# Patient Record
Sex: Female | Born: 1965 | Race: White | Hispanic: No | Smoking: Current every day smoker
Health system: Southern US, Community
[De-identification: ages and names within clinical notes are randomized; demographics above are authoritative.]

## PROBLEM LIST (undated history)

## (undated) DIAGNOSIS — F319 Bipolar disorder, unspecified: Secondary | ICD-10-CM

## (undated) DIAGNOSIS — R569 Unspecified convulsions: Secondary | ICD-10-CM

## (undated) DIAGNOSIS — R748 Abnormal levels of other serum enzymes: Secondary | ICD-10-CM

## (undated) DIAGNOSIS — Z8711 Personal history of peptic ulcer disease: Secondary | ICD-10-CM

## (undated) DIAGNOSIS — M810 Age-related osteoporosis without current pathological fracture: Secondary | ICD-10-CM

## (undated) DIAGNOSIS — R7611 Nonspecific reaction to tuberculin skin test without active tuberculosis: Secondary | ICD-10-CM

## (undated) DIAGNOSIS — K449 Diaphragmatic hernia without obstruction or gangrene: Secondary | ICD-10-CM

## (undated) DIAGNOSIS — F17200 Nicotine dependence, unspecified, uncomplicated: Secondary | ICD-10-CM

## (undated) DIAGNOSIS — F1421 Cocaine dependence, in remission: Secondary | ICD-10-CM

## (undated) DIAGNOSIS — G43909 Migraine, unspecified, not intractable, without status migrainosus: Secondary | ICD-10-CM

## (undated) DIAGNOSIS — S76011D Strain of muscle, fascia and tendon of right hip, subsequent encounter: Secondary | ICD-10-CM

## (undated) DIAGNOSIS — K209 Esophagitis, unspecified without bleeding: Secondary | ICD-10-CM

## (undated) DIAGNOSIS — D649 Anemia, unspecified: Secondary | ICD-10-CM

## (undated) DIAGNOSIS — F419 Anxiety disorder, unspecified: Secondary | ICD-10-CM

## (undated) DIAGNOSIS — K2270 Barrett's esophagus without dysplasia: Secondary | ICD-10-CM

## (undated) DIAGNOSIS — Z87440 Personal history of urinary (tract) infections: Secondary | ICD-10-CM

## (undated) DIAGNOSIS — J449 Chronic obstructive pulmonary disease, unspecified: Secondary | ICD-10-CM

## (undated) DIAGNOSIS — S52502A Unspecified fracture of the lower end of left radius, initial encounter for closed fracture: Secondary | ICD-10-CM

## (undated) DIAGNOSIS — Z8719 Personal history of other diseases of the digestive system: Secondary | ICD-10-CM

## (undated) HISTORY — DX: Age-related osteoporosis without current pathological fracture: M81.0

## (undated) HISTORY — DX: Abnormal levels of other serum enzymes: R74.8

## (undated) HISTORY — DX: Anemia, unspecified: D64.9

## (undated) HISTORY — DX: Esophagitis, unspecified without bleeding: K20.90

## (undated) HISTORY — DX: Diaphragmatic hernia without obstruction or gangrene: K44.9

## (undated) HISTORY — DX: Nonspecific reaction to tuberculin skin test without active tuberculosis: R76.11

## (undated) HISTORY — DX: Unspecified convulsions: R56.9

## (undated) HISTORY — DX: Nicotine dependence, unspecified, uncomplicated: F17.200

## (undated) HISTORY — DX: Chronic obstructive pulmonary disease, unspecified: J44.9

## (undated) HISTORY — DX: Barrett's esophagus without dysplasia: K22.70

## (undated) HISTORY — DX: Bipolar disorder, unspecified: F31.9

## (undated) HISTORY — DX: Personal history of urinary (tract) infections: Z87.440

## (undated) HISTORY — PX: DILATION AND CURETTAGE OF UTERUS: SHX78

---
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
## 1987-09-01 DIAGNOSIS — R7611 Nonspecific reaction to tuberculin skin test without active tuberculosis: Secondary | ICD-10-CM

## 1987-09-01 HISTORY — DX: Nonspecific reaction to tuberculin skin test without active tuberculosis: R76.11

## 2001-08-31 HISTORY — PX: TUBAL LIGATION: SHX77

## 2004-08-31 HISTORY — PX: LAPAROSCOPIC CHOLECYSTECTOMY: SUR755

## 2010-06-26 ENCOUNTER — Ambulatory Visit: Payer: Self-pay | Admitting: Family Medicine

## 2010-12-18 ENCOUNTER — Encounter: Payer: Self-pay | Admitting: Family Medicine

## 2010-12-18 ENCOUNTER — Ambulatory Visit (INDEPENDENT_AMBULATORY_CARE_PROVIDER_SITE_OTHER): Payer: 59 | Admitting: Family Medicine

## 2010-12-18 VITALS — BP 122/82 | HR 88 | Temp 97.6°F | Ht 62.0 in | Wt 162.0 lb

## 2010-12-18 DIAGNOSIS — Z0000 Encounter for general adult medical examination without abnormal findings: Secondary | ICD-10-CM

## 2010-12-18 DIAGNOSIS — Z1231 Encounter for screening mammogram for malignant neoplasm of breast: Secondary | ICD-10-CM | POA: Insufficient documentation

## 2010-12-18 DIAGNOSIS — Z720 Tobacco use: Secondary | ICD-10-CM

## 2010-12-18 DIAGNOSIS — F319 Bipolar disorder, unspecified: Secondary | ICD-10-CM

## 2010-12-18 DIAGNOSIS — F17200 Nicotine dependence, unspecified, uncomplicated: Secondary | ICD-10-CM

## 2010-12-18 DIAGNOSIS — R062 Wheezing: Secondary | ICD-10-CM

## 2010-12-18 LAB — CBC WITH DIFFERENTIAL/PLATELET
Basophils Absolute: 0 10*3/uL (ref 0.0–0.1)
Basophils Relative: 0.3 % (ref 0.0–3.0)
HCT: 40.7 % (ref 36.0–46.0)
Hemoglobin: 13.8 g/dL (ref 12.0–15.0)
Lymphs Abs: 1.9 10*3/uL (ref 0.7–4.0)
MCHC: 33.9 g/dL (ref 30.0–36.0)
Monocytes Relative: 6 % (ref 3.0–12.0)
Neutro Abs: 8.3 10*3/uL — ABNORMAL HIGH (ref 1.4–7.7)
RBC: 4.25 Mil/uL (ref 3.87–5.11)
RDW: 13.4 % (ref 11.5–14.6)

## 2010-12-18 LAB — LIPID PANEL
Cholesterol: 219 mg/dL — ABNORMAL HIGH (ref 0–200)
HDL: 56.1 mg/dL (ref >39.00)
Total CHOL/HDL Ratio: 4
Triglycerides: 161 mg/dL — ABNORMAL HIGH (ref 0.0–149.0)
VLDL: 32.2 mg/dL (ref 0.0–40.0)

## 2010-12-18 LAB — COMPREHENSIVE METABOLIC PANEL
ALT: 13 U/L (ref 0–35)
AST: 13 U/L (ref 0–37)
Creatinine, Ser: 0.8 mg/dL (ref 0.4–1.2)
Total Bilirubin: 0.5 mg/dL (ref 0.3–1.2)

## 2010-12-18 MED ORDER — ALBUTEROL 90 MCG/ACT IN AERS: 2.0000 | INHALATION_SPRAY | Freq: Four times a day (QID) | RESPIRATORY_TRACT | Status: DC | PRN

## 2010-12-18 NOTE — Assessment & Plan Note (Signed)
Discussed smoking cessation, provided info on [email or web address]

## 2010-12-18 NOTE — Assessment & Plan Note (Signed)
Albuterol as needed, states has had this in past.

## 2010-12-18 NOTE — Progress Notes (Signed)
  Subjective:    Patient ID: Robin Welch, female    DOB: [birth year], 45 y.o.   MRN: [id number]  HPI CC: new patient, establish  concerns - overweight - since started lithium.  Also has trouble working at computer, trouble focusing and gets irritated.  Can't focus to read book.  Has diarrhea every day.  Anxiety level stays high.  Brain continues to repeat things over and over again, sometimes slurs speech in afternoons.  Feels very anxious about this, feels memory has been worsening recently.  ie thinks "jenny on block" and it continues to go through brain.  Feels looping through.  No auditory/visual hallucinations.  Thoughts not intrusive.  This is first time has been on lithium.  Has been on abilify for long time, prior on effexor and lexapro.    Hospitalized 05/2010 for extreme anxiety attacks.  H/o bipolar diagnosed >10 years ago.  No HA, unilateral weakness, facial weakness.  Preventative: No recent blood work, CPE Last pap 2006 No mammograms  Review of Systems  Constitutional: Negative for fever, chills, activity change, appetite change, fatigue and unexpected weight change.  HENT: Negative for hearing loss and neck pain.   Eyes: Negative for visual disturbance.  Respiratory: Negative for cough, chest tightness, shortness of breath and wheezing.   Cardiovascular: Negative for chest pain, palpitations and leg swelling.  Gastrointestinal: Negative for nausea, vomiting, abdominal pain, diarrhea, constipation, blood in stool and abdominal distention.  Genitourinary: Negative for hematuria and difficulty urinating.  Musculoskeletal: Negative for myalgias and arthralgias.  Skin: Negative for rash.  Neurological: Negative for dizziness, seizures, syncope and headaches.  Hematological: Does not bruise/bleed easily.  Psychiatric/Behavioral: Negative for dysphoric mood. The patient is not nervous/anxious.   no breast masses.     Objective:   Physical Exam  Nursing note and vitals  reviewed. Constitutional: She is oriented to person, place, and time. She appears well-developed and well-nourished.  HENT:  Head: Normocephalic and atraumatic.  Right Ear: External ear normal.  Left Ear: External ear normal.  Nose: Nose normal.  Mouth/Throat: Oropharynx is clear and moist. No oropharyngeal exudate.  Eyes: Conjunctivae and EOM are normal. Pupils are equal, round, and reactive to light. No scleral icterus.  Neck: Normal range of motion. Neck supple. No thyromegaly present.  Cardiovascular: Normal rate, regular rhythm, normal heart sounds and intact distal pulses.   No murmur heard. Pulses:      Radial pulses are 2+ on the right side, and 2+ on the left side.  Pulmonary/Chest: Effort normal and breath sounds normal. No respiratory distress. She has no wheezes. She has no rales.  Abdominal: Soft. Bowel sounds are normal. She exhibits no distension and no mass. There is no tenderness. There is no rebound and no guarding.  Musculoskeletal: Normal range of motion.  Lymphadenopathy:    She has no cervical adenopathy.  Neurological: She is alert and oriented to person, place, and time.       CN grossly intact, station and gait intact  Skin: Skin is warm and dry. No rash noted.  Psychiatric: Her behavior is normal. Judgment and thought content normal. Her mood appears anxious.          Assessment & Plan:

## 2010-12-18 NOTE — Patient Instructions (Signed)
Blood work today.  We wil fax to Dr. Runell Gess office at [phone] Return in next 1-2 months for complete physical. Think about cutting back or quitting smoking.  Look into [email or web address]. Let psychiatrist know about those symptoms to see what she thinks.  Continue meds for now. Albuterol sent to pharmacy to use as needed.  If not noticing improvement in breathing, return for office spirometry.

## 2010-12-18 NOTE — Assessment & Plan Note (Signed)
Set up with mammo per pt request.  Will be baseline mammo as hasn't had in past. Set up with cpe with pap in near future.  Fasting today so blood work checked.

## 2010-12-18 NOTE — Assessment & Plan Note (Signed)
check blood work today.  Dx >10 years As far as bipolar sxs, seems stable on current meds, wonder if recent increase too fast causing some of sxs described. Reviewed lithium, can cause diarrhea, slurred speech, weight gain. rec discuss with psych, has appt Tuesday.

## 2010-12-27 ENCOUNTER — Ambulatory Visit (INDEPENDENT_AMBULATORY_CARE_PROVIDER_SITE_OTHER): Payer: 59 | Admitting: Family Medicine

## 2010-12-27 ENCOUNTER — Encounter: Payer: Self-pay | Admitting: Family Medicine

## 2010-12-27 ENCOUNTER — Emergency Department (HOSPITAL_COMMUNITY)
Admission: EM | Admit: 2010-12-27 | Discharge: 2010-12-27 | Disposition: A | Discharge status: Home / Self Care | Payer: 59 | Attending: Emergency Medicine | Admitting: Emergency Medicine

## 2010-12-27 ENCOUNTER — Emergency Department (HOSPITAL_COMMUNITY): Payer: 59

## 2010-12-27 DIAGNOSIS — R109 Unspecified abdominal pain: Secondary | ICD-10-CM | POA: Insufficient documentation

## 2010-12-27 DIAGNOSIS — R1033 Periumbilical pain: Secondary | ICD-10-CM | POA: Insufficient documentation

## 2010-12-27 DIAGNOSIS — R300 Dysuria: Secondary | ICD-10-CM

## 2010-12-27 DIAGNOSIS — F319 Bipolar disorder, unspecified: Secondary | ICD-10-CM | POA: Insufficient documentation

## 2010-12-27 DIAGNOSIS — Z9089 Acquired absence of other organs: Secondary | ICD-10-CM | POA: Insufficient documentation

## 2010-12-27 DIAGNOSIS — Z9889 Other specified postprocedural states: Secondary | ICD-10-CM | POA: Insufficient documentation

## 2010-12-27 DIAGNOSIS — R10813 Right lower quadrant abdominal tenderness: Secondary | ICD-10-CM | POA: Insufficient documentation

## 2010-12-27 LAB — DIFFERENTIAL
Basophils Relative: 0 % (ref 0–1)
Eosinophils Absolute: 0.2 10*3/uL (ref 0.0–0.7)
Monocytes Absolute: 0.8 10*3/uL (ref 0.1–1.0)
Monocytes Relative: 7 % (ref 3–12)

## 2010-12-27 LAB — POCT URINALYSIS DIPSTICK
Bilirubin, UA: NEGATIVE
Ketones, UA: NEGATIVE
Leukocytes, UA: NEGATIVE
Protein, UA: NEGATIVE
Spec Grav, UA: 1.005

## 2010-12-27 LAB — POCT I-STAT, CHEM 8
BUN: 10 mg/dL (ref 6–23)
Calcium, Ion: 1.15 mmol/L (ref 1.12–1.32)
Creatinine, Ser: 1.1 mg/dL (ref 0.4–1.2)
Glucose, Bld: 92 mg/dL (ref 70–99)
TCO2: 23 mmol/L (ref 0–100)

## 2010-12-27 LAB — LITHIUM LEVEL: Lithium Lvl: 1.43 mEq/L — ABNORMAL HIGH (ref 0.80–1.40)

## 2010-12-27 LAB — CBC
MCH: 31.8 pg (ref 26.0–34.0)
MCHC: 33 g/dL (ref 30.0–36.0)
MCV: 96.5 fL (ref 78.0–100.0)
Platelets: 410 10*3/uL — ABNORMAL HIGH (ref 150–400)

## 2010-12-27 LAB — URINALYSIS, ROUTINE W REFLEX MICROSCOPIC
Bilirubin Urine: NEGATIVE
Glucose, UA: NEGATIVE mg/dL
Hgb urine dipstick: NEGATIVE
Ketones, ur: NEGATIVE mg/dL
Protein, ur: NEGATIVE mg/dL

## 2010-12-27 MED ORDER — IOHEXOL 300 MG/ML  SOLN
125.0000 mL | Freq: Once | INTRAMUSCULAR | Status: AC | PRN
  Administered 2010-12-27: 125 mL via INTRAVENOUS

## 2010-12-27 NOTE — Progress Notes (Signed)
  Subjective:    Patient ID: Robin Welch, female    DOB: 23-Feb-1966, 45 y.o.   MRN: [id number]  HPIwd wn fem. Smoker with the sudden onset of severe peri/umb. abd pain x 1 day  Approx. Noon yest. She dev. The sudden onset of severe, sharp, peri umb abd pain.  Constant,  '6',,,,no naus, vom., dia, etc   Thinks it is a uti but u/a wnl    Review of Systems  Constitutional: Negative.   Gastrointestinal: Positive for abdominal pain.  Genitourinary: Negative.        Objective:   Physical Exam  Constitutional: She appears well-developed and well-nourished. No distress.  Abdominal: Soft. Bowel sounds are normal. She exhibits no distension and no mass. There is tenderness. There is rebound. There is no guarding.  Skin: She is not diaphoretic.          Assessment & Plan:  Acute peri-umb abd pain x 1 days  ?app. ? rupt ov cyst.........to er for eval

## 2010-12-27 NOTE — Patient Instructions (Signed)
Go to the er now  °

## 2011-01-15 ENCOUNTER — Telehealth: Payer: Self-pay | Admitting: *Deleted

## 2011-01-15 NOTE — Telephone Encounter (Signed)
Pt has been seeing a psychiatrist, someone in Dr. Runell Gess office, but pt is not happy there and would like the names of some others. Either [place] or [facility] will be fine.

## 2011-01-15 NOTE — Telephone Encounter (Signed)
Shirlee Limerick, do you know of any psychiatrists to refer to?  I'm not familiar with any in the area.

## 2011-01-16 NOTE — Telephone Encounter (Signed)
Called patient and gave her names of new Drs in [place] area. She will call to make an appt.

## 2011-04-25 ENCOUNTER — Emergency Department: Payer: Self-pay | Admitting: Internal Medicine

## 2011-11-03 ENCOUNTER — Telehealth: Payer: Self-pay | Admitting: Family Medicine

## 2011-11-03 NOTE — Telephone Encounter (Signed)
Order in chart.  Will route to [facility].

## 2011-11-03 NOTE — Telephone Encounter (Signed)
Patient has never had a mammogram.  Patient would like to have a mammogram scheduled in [place].  She's not having any problems.

## 2011-11-30 ENCOUNTER — Ambulatory Visit: Payer: 59

## 2011-12-07 ENCOUNTER — Ambulatory Visit
Admission: RE | Admit: 2011-12-07 | Discharge: 2011-12-07 | Disposition: A | Discharge status: Home / Self Care | Payer: 59 | Source: Ambulatory Visit | Attending: Family Medicine | Admitting: Family Medicine

## 2011-12-07 DIAGNOSIS — Z1231 Encounter for screening mammogram for malignant neoplasm of breast: Secondary | ICD-10-CM

## 2011-12-08 ENCOUNTER — Encounter: Payer: Self-pay | Admitting: *Deleted

## 2012-08-09 ENCOUNTER — Ambulatory Visit: Payer: 59 | Admitting: Gynecology

## 2012-08-23 ENCOUNTER — Ambulatory Visit: Payer: 59 | Admitting: Gynecology

## 2012-10-08 ENCOUNTER — Emergency Department (HOSPITAL_COMMUNITY)
Admission: EM | Admit: 2012-10-08 | Discharge: 2012-10-08 | Disposition: A | Discharge status: Home / Self Care | Payer: 59 | Attending: Emergency Medicine | Admitting: Emergency Medicine

## 2012-10-08 DIAGNOSIS — F319 Bipolar disorder, unspecified: Secondary | ICD-10-CM | POA: Insufficient documentation

## 2012-10-08 DIAGNOSIS — Z87440 Personal history of urinary (tract) infections: Secondary | ICD-10-CM | POA: Insufficient documentation

## 2012-10-08 DIAGNOSIS — Z8669 Personal history of other diseases of the nervous system and sense organs: Secondary | ICD-10-CM | POA: Insufficient documentation

## 2012-10-08 DIAGNOSIS — Z79899 Other long term (current) drug therapy: Secondary | ICD-10-CM | POA: Insufficient documentation

## 2012-10-08 DIAGNOSIS — F17200 Nicotine dependence, unspecified, uncomplicated: Secondary | ICD-10-CM | POA: Insufficient documentation

## 2012-10-08 DIAGNOSIS — F410 Panic disorder [episodic paroxysmal anxiety] without agoraphobia: Secondary | ICD-10-CM | POA: Insufficient documentation

## 2012-10-08 DIAGNOSIS — Z8709 Personal history of other diseases of the respiratory system: Secondary | ICD-10-CM | POA: Insufficient documentation

## 2012-10-08 MED ORDER — LORAZEPAM 1 MG PO TABS
1.0000 mg | ORAL_TABLET | Freq: Once | ORAL | Status: AC
  Administered 2012-10-08: 1 mg via ORAL
  Filled 2012-10-08: qty 1

## 2012-10-08 MED ORDER — LORAZEPAM 1 MG PO TABS: 1.0000 mg | ORAL_TABLET | Freq: Two times a day (BID) | ORAL | Status: DC | PRN

## 2012-10-08 NOTE — ED Notes (Signed)
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 Pt denies SI or HI states just wants something to help with the anxiety / panic attacks until can go to [facility].

## 2012-10-08 NOTE — ED Provider Notes (Signed)
Medical screening examination/treatment/procedure(s) were performed by non-physician practitioner and as supervising physician I was immediately available for consultation/collaboration.  Sunnie Nielsen, MD 10/08/12 2312

## 2012-10-08 NOTE — ED Provider Notes (Signed)
History     CSN: [id number]  Arrival date & time 10/08/12  0217   First MD Initiated Contact with Patient 10/08/12 [phone]      CC: Panic attack  (Consider location/radiation/quality/duration/timing/severity/associated sxs/prior treatment) HPI Comments: Pt with h/o bipolar, presents with panic attacks. States she is on clonazepam however she is now out because she took more than she was supposed to. She has her other medications. Reporting database shows #180 0.5mg  clonazepam filled 09/05/12. She states she is going to [facility] in 2 days. Requesting medication until then. No SI/HI. The onset of this condition was acute. The course is constant. Aggravating factors: none. Alleviating factors: none. History of cocaine abuse -- last use 4 years ago.    The history is provided by the patient.    Past Medical History  Diagnosis Date  . Bipolar 1 disorder   . History of tension headache   . History of migraines   . 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 Seizure     from HA medicine?   Marland Kitchen Hx: UTI (urinary tract infection)     Past Surgical History  Procedure Laterality Date  . Cholecystectomy  2006  . Tubal ligation  2003    Family History  Problem Relation Age of Onset  . Diabetes Mother     T2DM  . Breast cancer Mother 1  . Coronary artery disease Father 16    MI  . Cancer Neg Hx   . Stroke Neg Hx     History  Substance Use Topics  . Smoking status: Current Every Day Smoker -- 1.00 packs/day    Types: Cigarettes  . Smokeless tobacco: Never Used     Comment: started age 79yo, tried chantix in past  . Alcohol Use: No    OB History   Grav Para Term Preterm Abortions TAB SAB Ect Mult Living                  Review of Systems  Constitutional: Negative for fever.  HENT: Negative for sore throat and rhinorrhea.   Eyes: Negative for redness.  Respiratory: Negative for cough.   Cardiovascular: Negative for chest pain.  Gastrointestinal: Negative for  nausea, vomiting, abdominal pain and diarrhea.  Genitourinary: Negative for dysuria.  Musculoskeletal: Negative for myalgias.  Skin: Negative for rash.  Neurological: Negative for headaches.  Psychiatric/Behavioral: Negative for suicidal ideas, sleep disturbance and self-injury. The patient is nervous/anxious.     Allergies  Prozac  Home Medications   Current Outpatient Rx  Name  Route  Sig  Dispense  Refill  . EXPIRED: albuterol (PROVENTIL,VENTOLIN) 90 MCG/ACT inhaler   Inhalation   Inhale 2 puffs into the lungs every 6 (six) hours as needed for wheezing.   17 g   3   . ARIPiprazole (ABILIFY) 15 MG tablet   Oral   Take 15 mg by mouth daily.           Marland Kitchen lithium (ESKALITH) 450 MG CR tablet   Oral   Take 450 mg by mouth 2 (two) times daily.             BP 111/67  Pulse 77  Temp(Src) 97.9 F (36.6 C) (Oral)  Resp 18  SpO2 100%  Physical Exam  Nursing note and vitals reviewed. Constitutional: She appears well-developed and well-nourished.  HENT:  Head: Normocephalic and atraumatic.  Eyes: Conjunctivae are normal. Right eye exhibits no discharge. Left eye exhibits no  discharge.  Neck: Normal range of motion. Neck supple.  Cardiovascular: Normal rate, regular rhythm and normal heart sounds.   Pulmonary/Chest: Effort normal and breath sounds normal.  Abdominal: Soft. There is no tenderness.  Neurological: She is alert.  Skin: Skin is warm and dry.  Psychiatric: She has a normal mood and affect.    ED Course  Procedures (including critical care time)  Labs Reviewed - No data to display No results found.   1. Panic attack     6:33 AM Patient seen and examined. Will give limited amount of ativan for patient to use over the weekend. She is otherwise well. She has her other medications.   Vital signs reviewed and are as follows: Filed Vitals:   10/08/12 0638  BP: 111/67  Pulse: 77  Temp: 97.9 F (36.6 C)  Resp: 18   Patient urged to return with  worsening symptoms or other concerns. Patient verbalized understanding and agrees with plan.     MDM  Panic attack, normal exam in ED. No SI/HI. She states she has appt with [facility] upcoming.         [place], [place] 10/08/12 [phone]

## 2012-10-08 NOTE — Discharge Instructions (Signed)
Please read and follow all provided instructions.  Your diagnoses today include:  1. Panic attack     Tests performed today include:  Vital signs. See below for your results today.   Medications prescribed:   Ativan - medication for anxiety  Take any prescribed medications only as directed.  Home care instructions:  Follow any educational materials contained in this packet.  BE VERY CAREFUL not to take multiple medicines containing Tylenol (also called acetaminophen). Doing so can lead to an overdose which can damage your liver and cause liver failure and possibly death.   Follow-up instructions: Please follow-up with your primary care provider in the next 3 days for further evaluation of your symptoms. If you do not have a primary care doctor -- see below for referral information.   [facility].   Return instructions:   Please return to the Emergency Department if you experience worsening symptoms.   Please return if you have any other emergent concerns.  -------------- RESOURCE GUIDE  Chronic Pain Problems:  [facility]:  [phone]  Patients need to be referred by their primary care doctor  Insufficient Money for Medicine:  [employer]:     call "211"  [facility]:  2187524792  No Primary Care Doctor:  To locate a primary care doctor that accepts your insurance or provides certain services:  [facility] :   [phone]  Physician Referral Service:  [phone]  Agencies that provide inexpensive medical care:  [facility]:   [phone]  [facility]:   [phone]  [facility]:   [phone]  Women's Clinic:     [phone]  Planned Parenthood:    [phone]  [facility]:     [phone]  Medicaid-accepting [facility] Providers:  [facility]  [address], [phone], Mon-Fri 9am-7pm, Sat  9am-1pm  [facility]  [address] [place], [address] [place], [phone]  [facility]  [address] [place], [phone]  [facility]  [address], [phone]  Renaye Rakers  [address], [phone]  Only accepts [place] [employer] patients after they have their name  applied to their card  Self Pay (no insurance) in [facility]:  Sickle Cell Patients: Dr. Willey Blade, [facility]  [address] [place], [phone]  [facility]  [address] [place], [phone]  [facility] [place]  [address] [facility] [address], [facility] - [address]  [phone], Mon-Fri 9am-7pm, Sat 9am-1pm  [facility]  [address] [place], [phone]  [facility]  [address], [phone]  [facility]  [address], [phone]  Dr. Julio Sicks  [address], [place], [phone]  [facility]  [address], [phone]  [facility]  [address], [phone]  [address], [phone]  [facility]  [address] [place], [phone], 1st & 3rd Saturday every month, 10am-1pm  Strategies for finding a Primary Care Provider:  1) Find a Doctor and Pay Out of Pocket Although you won't have to find out who is covered by your insurance plan, it is a good idea to ask around and get recommendations. You will then need to call  the office and see if the doctor you have chosen will accept you as a new patient and what types of options they offer for patients who are self-pay. Some doctors offer discounts or will set up payment plans for their patients who do not have insurance, but you will need to ask so you aren't surprised when you get to your appointment.  2) Contact Your Local Health Department Not all health departments have doctors  that can see patients for sick visits, but many do, so it is worth a call to see if yours does. If you don't know where your local health department is, you can check in your phone book. The CDC also has a tool to help you locate your state's health department, and many state websites also have listings of all of their local health departments.  3) Find a Walk-in Clinic If your illness is not likely to be very severe or complicated, you may want to try a walk in clinic. These are popping up all over the country in pharmacies, drugstores, and shopping centers. They're usually staffed by nurse practitioners or physician assistants that have been trained to treat common illnesses and complaints. They're usually fairly quick and inexpensive. However, if you have serious medical issues or chronic medical problems, these are probably not your best option  STD Testing:  [facility] [place], [place] [facility]  [address] [place], phone [phone] or [phone]    Monday - Friday, call for an appointment  [facility], [place] [facility]  [address], [place], phone [phone] or [phone]   Monday - Friday, call for an appointment  Abuse/Neglect:  [facility] Child Abuse Hotline:  867-079-9345  [facility] Child Abuse Hotline: 773-412-0479 (After Hours)  Emergency Shelter:  [facility] 671-814-2374  Maternity Homes:  [facility]: [phone]  [facility] Services: [phone]  MRSA Hotline:   3651275650   [facility]  [address]  [phone]   [place]  [address], [place]  [phone]   [facility]  [address], [place]  [phone]   [facility]  [phone]   [facility]  Services  [phone]   [facility] in [place]  [address]  [phone], [facility] Child Abuse Hotline  716-728-0789  [phone] (After Hours)   Behavioral Health Services  Substance Abuse Resources:  Alcohol and Drug Services:     307-073-5980  [facility]:   [phone]  [facility]:     [phone]  [facility]:      [phone]  Residential & Outpatient Substance Abuse Program: 519-312-8199  Psychological Services:  [facility]:   [phone]  [facility] Services:    5153917582  [facility]  [address] [place]  ACCESS LINE: [phone] or [phone]  [employer]  Dental Assistance: If unable to pay or uninsured, contact:  [facility] or [facility]. to become qualified for the adult dental clinic.  Patients with Medicaid  [facility]  [phone] [address], [phone]  [address] [phone]  If unable to pay, or uninsured: contact [facility] [phone]) or [facility] Department [phone] in [place], [phone] in [place]) to become qualified  for the adult dental clinic  Other [facility] Services:  [facility]  [address] [place], [place], [place], [address]  [phone], Ext. [phone]  2nd and 4th Thursday of the month at [facility]  [facility]  [address] [place], [place], [place], [address]  [phone]  [facility]  [address] [place], [place], [address]  [phone]  [facility] Health Department  [phone]  [facility] Health Department  [phone]  [facility] Health Department  [phone]

## 2012-12-18 ENCOUNTER — Telehealth: Payer: Self-pay | Admitting: Family Medicine

## 2012-12-18 ENCOUNTER — Encounter: Payer: Self-pay | Admitting: Family Medicine

## 2012-12-18 DIAGNOSIS — Z79899 Other long term (current) drug therapy: Secondary | ICD-10-CM

## 2012-12-18 DIAGNOSIS — F3175 Bipolar disorder, in partial remission, most recent episode depressed: Secondary | ICD-10-CM

## 2012-12-18 NOTE — Telephone Encounter (Signed)
Received request from Dr. Tomasa Rand psychiatrist to obtain A1c and lipid panel, dx V58.69, 296.55 plz have pt come in for fasting blood work and afterwards for physical as she's due. Then request fax results to [phone] attn Dr. Tomasa Rand.

## 2012-12-19 ENCOUNTER — Other Ambulatory Visit: Payer: 59

## 2012-12-20 NOTE — Telephone Encounter (Signed)
Patient has appts scheduled

## 2012-12-22 ENCOUNTER — Encounter: Payer: 59 | Admitting: Family Medicine

## 2012-12-27 ENCOUNTER — Other Ambulatory Visit: Payer: Self-pay

## 2012-12-27 DIAGNOSIS — Z1231 Encounter for screening mammogram for malignant neoplasm of breast: Secondary | ICD-10-CM

## 2012-12-28 ENCOUNTER — Other Ambulatory Visit (INDEPENDENT_AMBULATORY_CARE_PROVIDER_SITE_OTHER): Payer: 59

## 2012-12-28 DIAGNOSIS — F17200 Nicotine dependence, unspecified, uncomplicated: Secondary | ICD-10-CM

## 2012-12-28 DIAGNOSIS — Z79899 Other long term (current) drug therapy: Secondary | ICD-10-CM

## 2012-12-28 DIAGNOSIS — Z720 Tobacco use: Secondary | ICD-10-CM

## 2012-12-28 DIAGNOSIS — F3175 Bipolar disorder, in partial remission, most recent episode depressed: Secondary | ICD-10-CM

## 2012-12-28 LAB — LDL CHOLESTEROL, DIRECT: Direct LDL: 128.1 mg/dL

## 2012-12-28 LAB — CBC WITH DIFFERENTIAL/PLATELET
Basophils Absolute: 0 10*3/uL (ref 0.0–0.1)
Eosinophils Absolute: 0.2 10*3/uL (ref 0.0–0.7)
Eosinophils Relative: 2.6 % (ref 0.0–5.0)
Lymphs Abs: 2.1 10*3/uL (ref 0.7–4.0)
MCV: 92.9 fl (ref 78.0–100.0)
Monocytes Absolute: 0.6 10*3/uL (ref 0.1–1.0)
Neutrophils Relative %: 58.1 % (ref 43.0–77.0)
Platelets: 386 10*3/uL (ref 150.0–400.0)
RDW: 13.5 % (ref 11.5–14.6)
WBC: 7 10*3/uL (ref 4.5–10.5)

## 2012-12-28 LAB — COMPREHENSIVE METABOLIC PANEL
AST: 14 U/L (ref 0–37)
Albumin: 4.3 g/dL (ref 3.5–5.2)
BUN: 12 mg/dL (ref 6–23)
Calcium: 8.9 mg/dL (ref 8.4–10.5)
Chloride: 102 mEq/L (ref 96–112)
Glucose, Bld: 76 mg/dL (ref 70–99)
Potassium: 4.5 mEq/L (ref 3.5–5.1)
Sodium: 135 mEq/L (ref 135–145)
Total Protein: 7.5 g/dL (ref 6.0–8.3)

## 2012-12-28 LAB — LIPID PANEL: VLDL: 13.6 mg/dL (ref 0.0–40.0)

## 2013-01-03 ENCOUNTER — Encounter: Payer: 59 | Admitting: Family Medicine

## 2013-01-11 ENCOUNTER — Ambulatory Visit
Admission: RE | Admit: 2013-01-11 | Discharge: 2013-01-11 | Disposition: A | Discharge status: Home / Self Care | Payer: 59 | Source: Ambulatory Visit

## 2013-01-11 DIAGNOSIS — Z1231 Encounter for screening mammogram for malignant neoplasm of breast: Secondary | ICD-10-CM

## 2013-01-12 ENCOUNTER — Encounter: Payer: Self-pay | Admitting: *Deleted

## 2013-01-17 ENCOUNTER — Encounter: Payer: 59 | Admitting: Family Medicine

## 2013-01-31 ENCOUNTER — Encounter: Payer: 59 | Admitting: Family Medicine

## 2013-02-21 ENCOUNTER — Encounter: Payer: 59 | Admitting: Family Medicine

## 2013-03-02 ENCOUNTER — Encounter: Payer: Self-pay | Admitting: Family Medicine

## 2013-03-02 ENCOUNTER — Ambulatory Visit (INDEPENDENT_AMBULATORY_CARE_PROVIDER_SITE_OTHER): Payer: 59 | Admitting: Family Medicine

## 2013-03-02 VITALS — BP 108/70 | HR 88 | Temp 97.4°F | Ht 62.0 in | Wt 125.8 lb

## 2013-03-02 DIAGNOSIS — F3175 Bipolar disorder, in partial remission, most recent episode depressed: Secondary | ICD-10-CM

## 2013-03-02 DIAGNOSIS — Z23 Encounter for immunization: Secondary | ICD-10-CM

## 2013-03-02 DIAGNOSIS — F17200 Nicotine dependence, unspecified, uncomplicated: Secondary | ICD-10-CM

## 2013-03-02 DIAGNOSIS — Z720 Tobacco use: Secondary | ICD-10-CM

## 2013-03-02 DIAGNOSIS — Z0000 Encounter for general adult medical examination without abnormal findings: Secondary | ICD-10-CM

## 2013-03-02 NOTE — Patient Instructions (Signed)
Good to see you today! Call us with questions. Return to see me in 1 year, sooner if needed. I want you to strongly consider cutting back and quitting smoking - best thing you can do for your overall health. Tdap today.

## 2013-03-02 NOTE — Assessment & Plan Note (Signed)
Strongly encouraged smoking cessation for health and financial benefit.

## 2013-03-02 NOTE — Assessment & Plan Note (Signed)
Preventative protocols reviewed and updated unless pt declined. Discussed healthy diet and lifestyle.  

## 2013-03-02 NOTE — Assessment & Plan Note (Signed)
Followed closely by Dr. Tomasa Rand. Pt endorses doing wonderfully.

## 2013-03-02 NOTE — Addendum Note (Signed)
Addended by: Shon Millet on: 03/02/2013 02:16 PM   Modules accepted: Orders

## 2013-03-02 NOTE — Progress Notes (Signed)
Subjective:    Patient ID: Robin Welch, female    DOB: [birth year], 47 y.o.   MRN: [id number]  HPI CC: CPE  Feeling wonder today - best she's felt in 10 years.  Feels she is finally on correct medications for bipolar.  Sees Dr. Tomasa Rand Psych for bipolar.  wellbutrin has helped anhedonia.  seroquel helping anxiety - started 3 d ago.  Smoking - 2 ppd.    Precontemplative.   Trying to cut back on diet Mt Dew.  Seat belt use discussed Sunscreen use discussed.  Preventative:  [facility] for well woman exams. Due to schedule. Mammo 2014 normal. Flu shot - received this year. Tetanus - has not had.  Tdap today.  Medications and allergies reviewed and updated in chart.  Past histories reviewed and updated if relevant as below. Patient Active Problem List   Diagnosis Date Noted  . Abdominal pain, periumbilical 12/27/2010  . Tobacco abuse 12/18/2010  . Wheezing 12/18/2010  . Bipolar I disorder, most recent episode (or current) depressed, in partial or unspecified remission 12/18/2010  . Other screening mammogram 12/18/2010  . Healthcare maintenance 12/18/2010   Past Medical History  Diagnosis Date  . Bipolar 1 disorder     sees Dr. Tomasa Rand psych [phone])  . History of tension headache   . History of migraines   . 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 Seizure     from HA medicine?   Marland Kitchen Hx: UTI (urinary tract infection)    Past Surgical History  Procedure Laterality Date  . Cholecystectomy  2006  . Tubal ligation  2003   History  Substance Use Topics  . Smoking status: Current Every Day Smoker -- 2.00 packs/day    Types: Cigarettes  . Smokeless tobacco: Never Used     Comment: started age 87yo, tried chantix in past  . Alcohol Use: No   Family History  Problem Relation Age of Onset  . Diabetes Mother     T2DM  . Breast cancer Mother 32  . Coronary artery disease Father 38    MI  . Cancer Neg Hx   . Stroke Neg Hx    Allergies  Allergen  Reactions  . Prozac (Fluoxetine Hcl) Swelling and Rash   Current Outpatient Prescriptions on File Prior to Visit  Medication Sig Dispense Refill  . lamoTRIgine (LAMICTAL) 200 MG tablet Take 200 mg by mouth daily.       No current facility-administered medications on file prior to visit.      Review of Systems  Constitutional: Negative for fever, chills, activity change, appetite change, fatigue and unexpected weight change.  HENT: Negative for hearing loss and neck pain.   Eyes: Negative for visual disturbance.  Respiratory: Negative for cough, chest tightness, shortness of breath and wheezing.   Cardiovascular: Negative for chest pain, palpitations and leg swelling.  Gastrointestinal: Negative for nausea, vomiting, abdominal pain, diarrhea, constipation, blood in stool and abdominal distention.  Genitourinary: Negative for hematuria and difficulty urinating.  Musculoskeletal: Negative for myalgias and arthralgias.  Skin: Negative for rash.  Neurological: Negative for dizziness, seizures, syncope and headaches.  Hematological: Negative for adenopathy. Does not bruise/bleed easily.  Psychiatric/Behavioral: Negative for dysphoric mood. The patient is not nervous/anxious.        Objective:   Physical Exam  Nursing note and vitals reviewed. Constitutional: She is oriented to person, place, and time. She appears well-developed and well-nourished. No distress.  HENT:  Head: Normocephalic  and atraumatic.  Right Ear: External ear normal.  Left Ear: External ear normal.  Nose: Nose normal.  Mouth/Throat: Oropharynx is clear and moist. No oropharyngeal exudate.  Eyes: Conjunctivae and EOM are normal. Pupils are equal, round, and reactive to light. No scleral icterus.  Neck: Normal range of motion. Neck supple. No thyromegaly present.  Cardiovascular: Normal rate, regular rhythm, normal heart sounds and intact distal pulses.   No murmur heard. Pulses:      Radial pulses are 2+ on the  right side, and 2+ on the left side.  Pulmonary/Chest: Effort normal and breath sounds normal. No respiratory distress. She has no wheezes. She has no rales.  Abdominal: Soft. Bowel sounds are normal. She exhibits no distension and no mass. There is no tenderness. There is no rebound and no guarding.  Musculoskeletal: Normal range of motion. She exhibits no edema.  Lymphadenopathy:    She has no cervical adenopathy.  Neurological: She is alert and oriented to person, place, and time.  CN grossly intact, station and gait intact  Skin: Skin is warm and dry. No rash noted.  Psychiatric: Her behavior is normal. Judgment and thought content normal.  Somewhat expansive affect      Assessment & Plan:

## 2013-03-31 DIAGNOSIS — J449 Chronic obstructive pulmonary disease, unspecified: Secondary | ICD-10-CM

## 2013-03-31 HISTORY — DX: Chronic obstructive pulmonary disease, unspecified: J44.9

## 2013-04-04 ENCOUNTER — Telehealth: Payer: Self-pay | Admitting: Family Medicine

## 2013-04-04 DIAGNOSIS — R7611 Nonspecific reaction to tuberculin skin test without active tuberculosis: Secondary | ICD-10-CM

## 2013-04-04 NOTE — Telephone Encounter (Signed)
Robin Welch dropped off a physcial form to be filled out.

## 2013-04-06 NOTE — Telephone Encounter (Signed)
Recent CPX in July. She says she will require a CXR because she has a history of + PPD. Form in your IN box for completion.

## 2013-04-07 NOTE — Telephone Encounter (Signed)
Placed in Kim's box to hold. Will need to come in for hearing, vision, CXR and blood work to ensure she is immune to Hep B and MMR. Orders placed.

## 2013-04-10 NOTE — Telephone Encounter (Signed)
Patient notified and appts scheduled

## 2013-04-11 ENCOUNTER — Encounter: Payer: Self-pay | Admitting: Family Medicine

## 2013-04-11 ENCOUNTER — Ambulatory Visit (INDEPENDENT_AMBULATORY_CARE_PROVIDER_SITE_OTHER)
Admission: RE | Admit: 2013-04-11 | Discharge: 2013-04-11 | Disposition: A | Discharge status: Home / Self Care | Payer: 59 | Source: Ambulatory Visit | Attending: Family Medicine | Admitting: Family Medicine

## 2013-04-11 ENCOUNTER — Ambulatory Visit (INDEPENDENT_AMBULATORY_CARE_PROVIDER_SITE_OTHER): Payer: 59 | Admitting: Family Medicine

## 2013-04-11 ENCOUNTER — Other Ambulatory Visit (INDEPENDENT_AMBULATORY_CARE_PROVIDER_SITE_OTHER): Payer: 59

## 2013-04-11 DIAGNOSIS — R7611 Nonspecific reaction to tuberculin skin test without active tuberculosis: Secondary | ICD-10-CM

## 2013-04-11 DIAGNOSIS — Z9189 Other specified personal risk factors, not elsewhere classified: Secondary | ICD-10-CM

## 2013-04-11 DIAGNOSIS — Z23 Encounter for immunization: Secondary | ICD-10-CM

## 2013-04-11 NOTE — Telephone Encounter (Signed)
CXR - some hyperinflation. Discussed with patient. H/o positive PPD, but treated.  Likely does not need f/u xrays given h/o treatment with INH (1989). Will await titers.  Placed forms back in Kim's office.

## 2013-04-12 LAB — MUMPS ANTIBODY, IGG: Mumps IgG: 173 AU/mL — ABNORMAL HIGH (ref <9.00)

## 2013-04-12 LAB — RUBELLA SCREEN: Rubella: 1.9 Index — ABNORMAL HIGH (ref <0.90)

## 2013-04-12 LAB — RUBEOLA ANTIBODY IGG: Rubeola IgG: >300 AU/mL — ABNORMAL HIGH (ref <25.00)

## 2013-09-22 ENCOUNTER — Ambulatory Visit (INDEPENDENT_AMBULATORY_CARE_PROVIDER_SITE_OTHER): Payer: 59 | Admitting: Family Medicine

## 2013-09-22 ENCOUNTER — Encounter: Payer: Self-pay | Admitting: Family Medicine

## 2013-09-22 VITALS — BP 132/82 | HR 105 | Temp 98.4°F | Ht 61.25 in | Wt 131.0 lb

## 2013-09-22 DIAGNOSIS — Z720 Tobacco use: Secondary | ICD-10-CM

## 2013-09-22 DIAGNOSIS — J449 Chronic obstructive pulmonary disease, unspecified: Secondary | ICD-10-CM

## 2013-09-22 DIAGNOSIS — F17200 Nicotine dependence, unspecified, uncomplicated: Secondary | ICD-10-CM

## 2013-09-22 DIAGNOSIS — J441 Chronic obstructive pulmonary disease with (acute) exacerbation: Secondary | ICD-10-CM | POA: Insufficient documentation

## 2013-09-22 DIAGNOSIS — J209 Acute bronchitis, unspecified: Secondary | ICD-10-CM

## 2013-09-22 MED ORDER — ALBUTEROL SULFATE HFA 108 (90 BASE) MCG/ACT IN AERS: 2.0000 | INHALATION_SPRAY | Freq: Four times a day (QID) | RESPIRATORY_TRACT | Status: DC | PRN

## 2013-09-22 MED ORDER — AZITHROMYCIN 250 MG PO TABS: ORAL_TABLET | ORAL | Status: DC

## 2013-09-22 MED ORDER — HYDROCOD POLST-CHLORPHEN POLST 10-8 MG/5ML PO LQCR: 5.0000 mL | Freq: Every evening | ORAL | Status: DC | PRN

## 2013-09-22 NOTE — Assessment & Plan Note (Signed)
Continue to encourage cessation to prevent recurrent bronchitis infections in h/o COPD by CXR. Pt remains precontemplative.

## 2013-09-22 NOTE — Patient Instructions (Signed)
I think you are developing bronchitis.  Treat with zpack, tussionex for cough at night time, and continue albuterol inhaler 2 puffs three times daily for next 2-3 days then back down to as needed. Let me know if not improving for steroid course. Good to see you today, call us with quesitons. Keep working on quitting smoking!

## 2013-09-22 NOTE — Progress Notes (Signed)
Pre-visit discussion using our clinic review tool. No additional management support is needed unless otherwise documented below in the visit note.  

## 2013-09-22 NOTE — Progress Notes (Signed)
   Subjective:    Patient ID: Robin Welch, female    DOB: Jan 18, 1966, 48 y.o.   MRN: [id number]  HPI CC: cough  2-3 wk h/o productive cough with congestion mainly in the morning, then over last 2-3 nights increased dry cough with coughing fits at night time.  Mainly chest congestion, no head congestion.  Possible wheezing - used inhaler this morning.  No dyspnea.  Mild congestion,  +ST.  + PNDrainage.  Albuterol helped with cough.  No fevers/chills, abd pain, nausea, ear or tooth pain.  No new rashes.  2 ppd chain smoker. + husband sick with similar illness. H/o COPD.  No recent bronchitis. Did get flu shot this year. tussionex has helped cough in the past.  Past Medical History  Diagnosis Date  . Bipolar 1 disorder     sees Dr. Candis Schatz psych [phone])  . History of tension headache   . History of migraines   . 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 Seizure     from HA medicine?   Marland Kitchen Hx: UTI (urinary tract infection)   . COPD (chronic obstructive pulmonary disease) 03/2013    hyperinflation by CXR  . Smoker      Review of Systems Per HPI    Objective:   Physical Exam  Nursing note and vitals reviewed. Constitutional: She appears well-developed and well-nourished. No distress.  HENT:  Head: Normocephalic and atraumatic.  Right Ear: Hearing, tympanic membrane, external ear and ear canal normal.  Left Ear: Hearing, tympanic membrane, external ear and ear canal normal.  Nose: No mucosal edema or rhinorrhea. Right sinus exhibits no maxillary sinus tenderness and no frontal sinus tenderness. Left sinus exhibits no maxillary sinus tenderness and no frontal sinus tenderness.  Mouth/Throat: Uvula is midline and mucous membranes are normal. Posterior oropharyngeal erythema (slight) present. No oropharyngeal exudate, posterior oropharyngeal edema or tonsillar abscesses.  Eyes: Conjunctivae and EOM are normal. Pupils are equal, round, and reactive to light. No scleral icterus.   Neck: Normal range of motion. Neck supple.  Cardiovascular: Normal rate, regular rhythm, normal heart sounds and intact distal pulses.   No murmur heard. Pulmonary/Chest: Effort normal and breath sounds normal. No respiratory distress. She has no decreased breath sounds. She has no wheezes. She has no rhonchi. She has no rales.  Coarse throughout but no wheeze appreciated  Lymphadenopathy:    She has no cervical adenopathy.  Skin: Skin is warm and dry. No rash noted.      Assessment & Plan:

## 2013-09-22 NOTE — Assessment & Plan Note (Signed)
Acute bronchitis in significant history of smoking and COPD. Treat with zpack, tussionex for cough as it has worked well in past, and albuterol prn at home. Will avoid steroid course today. Red flags to return discussed or to call us if not improved to consider steroid course.

## 2013-09-26 ENCOUNTER — Telehealth: Payer: Self-pay | Admitting: Family Medicine

## 2013-09-26 NOTE — Telephone Encounter (Signed)
Relevant patient education mailed to patient.  

## 2013-10-03 ENCOUNTER — Telehealth: Payer: Self-pay | Admitting: Family Medicine

## 2013-10-03 NOTE — Telephone Encounter (Signed)
Relevant patient education mailed to patient.  

## 2014-02-12 ENCOUNTER — Ambulatory Visit (INDEPENDENT_AMBULATORY_CARE_PROVIDER_SITE_OTHER): Payer: 59 | Admitting: Family Medicine

## 2014-02-12 ENCOUNTER — Encounter: Payer: Self-pay | Admitting: Family Medicine

## 2014-02-12 VITALS — BP 104/60 | HR 92 | Temp 97.6°F | Wt 132.8 lb

## 2014-02-12 DIAGNOSIS — J441 Chronic obstructive pulmonary disease with (acute) exacerbation: Secondary | ICD-10-CM

## 2014-02-12 MED ORDER — HYDROCOD POLST-CHLORPHEN POLST 10-8 MG/5ML PO LQCR: 5.0000 mL | Freq: Every evening | ORAL | Status: DC | PRN

## 2014-02-12 MED ORDER — PREDNISONE 20 MG PO TABS: ORAL_TABLET | ORAL | Status: DC

## 2014-02-12 MED ORDER — AZITHROMYCIN 250 MG PO TABS: ORAL_TABLET | ORAL | Status: DC

## 2014-02-12 NOTE — Patient Instructions (Signed)
I think you have flare of chronic inflammation in lungs - treat with prednisone course and tussionex script.   I have printed out zpack for you to hold on to in case not improving with above, or any worsening symptoms. I would like you to return once breathing normally for a lung function test to evaluate for COPD. Continue to think about cutting back on smoking.

## 2014-02-12 NOTE — Assessment & Plan Note (Addendum)
Acute COPD flare in h/o smoker and COPD by CXR. Treat with prednisone course and tussionex, provided with zack to fill if needed. Discussed how I don't think we currently need abx however. Continue albuterol. Update if sxs persist or fail to improve. 2nd bronchitis this year. I asked her to return at her convenience in next few weeks for spirometry

## 2014-02-12 NOTE — Progress Notes (Signed)
Pre visit review using our clinic review tool, if applicable. No additional management support is needed unless otherwise documented below in the visit note. 

## 2014-02-12 NOTE — Progress Notes (Signed)
BP 104/60  Pulse 92  Temp(Src) 97.6 F (36.4 C) (Oral)  Wt 132 lb 12 oz (60.215 kg)  SpO2 97%   CC: cough  Subjective:    Patient ID: Robin Welch, female    DOB: August 28, 1966, 48 y.o.   MRN: [id number]  HPI: Robin Welch is a 48 y.o. female presenting on 02/12/2014 for Cough   5d h/o cough, worse at night time. Does well during the day with inhaler. +wheezy and short winded. Using albuterol inhaler daily in the morning.  No fevers/chlils, abd pain, hear or tooth pain, congestion, ST. Taking mucinex for this.  Seen here 08/2013 with similar sxs and did improve with zpack and tussionex.  No sick contacts at home. H/o COPD. 2 ppd chain smoker.  Did get flu shot this year.  tussionex has helped cough in the past.  Relevant past medical, surgical, family and social history reviewed and updated as indicated.  Allergies and medications reviewed and updated. Current Outpatient Prescriptions on File Prior to Visit  Medication Sig  . albuterol (PROVENTIL HFA;VENTOLIN HFA) 108 (90 BASE) MCG/ACT inhaler Inhale 2 puffs into the lungs every 6 (six) hours as needed for wheezing or shortness of breath.  Marland Kitchen buPROPion (WELLBUTRIN XL) 150 MG 24 hr tablet Take 150 mg by mouth daily.  Marland Kitchen lamoTRIgine (LAMICTAL) 200 MG tablet Take 200 mg by mouth daily.  . ziprasidone (GEODON) 60 MG capsule Take 80 mg by mouth 3 (three) times daily.    No current facility-administered medications on file prior to visit.    Review of Systems Per HPI unless specifically indicated above    Objective:    BP 104/60  Pulse 92  Temp(Src) 97.6 F (36.4 C) (Oral)  Wt 132 lb 12 oz (60.215 kg)  SpO2 97%  Physical Exam  Nursing note and vitals reviewed. Constitutional: She appears well-developed and well-nourished. No distress.  HENT:  Head: Normocephalic and atraumatic.  Right Ear: Hearing, tympanic membrane, external ear and ear canal normal.  Left Ear: Hearing, tympanic membrane, external ear and ear canal normal.   Nose: No mucosal edema or rhinorrhea. Right sinus exhibits no maxillary sinus tenderness and no frontal sinus tenderness. Left sinus exhibits no maxillary sinus tenderness and no frontal sinus tenderness.  Mouth/Throat: Uvula is midline, oropharynx is clear and moist and mucous membranes are normal. No oropharyngeal exudate, posterior oropharyngeal edema, posterior oropharyngeal erythema or tonsillar abscesses.  Eyes: Conjunctivae and EOM are normal. Pupils are equal, round, and reactive to light. No scleral icterus.  Neck: Normal range of motion. Neck supple.  Cardiovascular: Normal rate, regular rhythm, normal heart sounds and intact distal pulses.   No murmur heard. Pulmonary/Chest: Effort normal. No respiratory distress. She has decreased breath sounds. She has no wheezes. She has rhonchi (scattered). She has no rales.  Coarse breath sounds throughout  Lymphadenopathy:    She has no cervical adenopathy.  Skin: Skin is warm and dry. No rash noted.       Assessment & Plan:   Problem List Items Addressed This Visit   COPD exacerbation - Primary     Acute COPD flare in h/o smoker and COPD by CXR. Treat with prednisone course and tussionex, provided with zack to fill if needed. Discussed how I don't think we currently need abx however. Continue albuterol. Update if sxs persist or fail to improve. 2nd bronchitis this year. I asked her to return at her convenience in next few weeks for spirometry    Relevant Medications  tussionex      predniSONE (DELTASONE) tablet      azithromycin (ZITHROMAX) tablet       Follow up plan: Return if symptoms worsen or fail to improve, for spirometry.

## 2014-02-13 ENCOUNTER — Telehealth: Payer: Self-pay | Admitting: Family Medicine

## 2014-02-13 NOTE — Telephone Encounter (Signed)
Relevant patient education mailed to patient.  

## 2014-04-02 ENCOUNTER — Encounter: Payer: Self-pay | Admitting: Family Medicine

## 2014-04-02 ENCOUNTER — Telehealth: Payer: Self-pay | Admitting: Family Medicine

## 2014-04-02 ENCOUNTER — Ambulatory Visit (INDEPENDENT_AMBULATORY_CARE_PROVIDER_SITE_OTHER): Payer: 59 | Admitting: Family Medicine

## 2014-04-02 VITALS — BP 138/80 | HR 110 | Temp 97.5°F | Wt 131.5 lb

## 2014-04-02 DIAGNOSIS — J441 Chronic obstructive pulmonary disease with (acute) exacerbation: Secondary | ICD-10-CM

## 2014-04-02 MED ORDER — PREDNISONE 20 MG PO TABS: ORAL_TABLET | ORAL | Status: DC

## 2014-04-02 MED ORDER — AZITHROMYCIN 250 MG PO TABS: ORAL_TABLET | ORAL | Status: DC

## 2014-04-02 MED ORDER — HYDROCOD POLST-CHLORPHEN POLST 10-8 MG/5ML PO LQCR: 5.0000 mL | Freq: Every evening | ORAL | Status: DC | PRN

## 2014-04-02 NOTE — Progress Notes (Signed)
Pre visit review using our clinic review tool, if applicable. No additional management support is needed unless otherwise documented below in the visit note.  Usually with SABA use a few times a week, now daily.  Sx going on for about 3 weeks.  Trying to quit smoking.  Started with cough, worse at night, worse in the last week.  No fevers.  No ST.  Voice is hoarse, worse at the day goes on. No sputum.  Some wheeze, "not a lot."  No vomiting.  No ear pain but had to irrigate her L ear prev for wax.  No rhinorrhea.    Prev took pred and zmax, cough medicine.  Tolerated pred and zmax w/o complication prev.  She feels lousy from coughing at night, disrupting her sleep.    No SI/HI.    Meds, vitals, and allergies reviewed.   ROS: See HPI.  Otherwise, noncontributory.  GEN: nad, alert and oriented HEENT: mucous membranes moist, tm w/o erythema B (reassured patient, canals wnl), nasal exam w/o erythema, clear discharge noted,  OP with cobblestoning, sinuses not ttp x4 NECK: supple w/o LA CV: rrr except for mildly tachy- patient was rushing to get here but not in distress.     PULM:  no inc wob but diffuse coarse BS with scattered rhonchi and exp wheeze EXT: no edema SKIN: no acute rash

## 2014-04-02 NOTE — Telephone Encounter (Signed)
Relevant patient education mailed to patient.  

## 2014-04-02 NOTE — Assessment & Plan Note (Signed)
Presumed, zmax, pred with GI cautions, and tussionex prn cough.  D/w pt.  Nontoxic.  Okay for outpatient f/u.  She agrees.  Call back prn.  F/u with PCP for likely PFTs when possible.

## 2014-04-02 NOTE — Patient Instructions (Signed)
Start back on the prednisone with food and use the antibiotics.  Take the cough medicine as needed.  Follow up with Dr. Darnell Level when you are feeling better. Take care.

## 2014-06-08 ENCOUNTER — Other Ambulatory Visit: Payer: Self-pay

## 2014-06-08 MED ORDER — ALBUTEROL SULFATE HFA 108 (90 BASE) MCG/ACT IN AERS: 2.0000 | INHALATION_SPRAY | Freq: Four times a day (QID) | RESPIRATORY_TRACT | Status: DC | PRN

## 2014-06-08 NOTE — Telephone Encounter (Signed)
Patient notified

## 2014-06-08 NOTE — Telephone Encounter (Signed)
Pt request refill ventolin inhaler; pt was seen 03/2014 and was to f/u with Dr Darnell Level; pt does not want to schedule appt at this time. Pt said every morning when she wakes up has wheezing and SOB but after uses inhaler she feels better.Please advise.[facility].

## 2014-06-08 NOTE — Telephone Encounter (Signed)
Sent in. I encourage continued efforts at smoking cessation. If persistent wheezing, dyspnea, recommend evaluation.

## 2014-06-14 ENCOUNTER — Telehealth: Payer: Self-pay

## 2014-06-14 MED ORDER — ALBUTEROL SULFATE HFA 108 (90 BASE) MCG/ACT IN AERS: 2.0000 | INHALATION_SPRAY | Freq: Four times a day (QID) | RESPIRATORY_TRACT | Status: DC | PRN

## 2014-06-14 NOTE — Telephone Encounter (Signed)
Sent new inhaler to pharmacy, proair. Notify pt.

## 2014-06-14 NOTE — Telephone Encounter (Signed)
Pt left v/m; ins co will not pay for ventolin inhaler and pt request different inhaler sent to [facility]. Spoke with [facility] and proair or proventil inhaler should be covered by ins.Please advise.

## 2014-06-15 NOTE — Telephone Encounter (Signed)
Pt left v/m;proair cost was $78.00 and too expensive for pt to pick up. Pt request different inhaler to be called in. Left message with pts son for pt to call back. (No DPR signed to release info to anyone).

## 2014-06-15 NOTE — Telephone Encounter (Signed)
Pt called back and pt will contact ins co to verify names of inhalers that ins will approve and be more affordable for pt. Pt will cb with names of approved inhalers.

## 2014-06-25 MED ORDER — ALBUTEROL SULFATE HFA 108 (90 BASE) MCG/ACT IN AERS: 2.0000 | INHALATION_SPRAY | Freq: Four times a day (QID) | RESPIRATORY_TRACT | Status: DC | PRN

## 2014-06-25 NOTE — Telephone Encounter (Signed)
proventil sent to pharmacy. May be same price however.

## 2014-06-25 NOTE — Telephone Encounter (Signed)
Spoke with pt ; pt has not contacted ins co and ask that Dr Darnell Level choose a different inhaler and send to [facility]. Explained reason for pt contacting ins co to get approved med but pt said just send a different one to pharmacy.

## 2014-08-27 ENCOUNTER — Encounter: Payer: Self-pay | Admitting: Family Medicine

## 2014-08-27 ENCOUNTER — Ambulatory Visit (INDEPENDENT_AMBULATORY_CARE_PROVIDER_SITE_OTHER): Payer: 59 | Admitting: Family Medicine

## 2014-08-27 VITALS — BP 98/62 | HR 93 | Temp 97.6°F | Ht 61.25 in | Wt 130.0 lb

## 2014-08-27 DIAGNOSIS — J441 Chronic obstructive pulmonary disease with (acute) exacerbation: Secondary | ICD-10-CM

## 2014-08-27 MED ORDER — PREDNISONE 20 MG PO TABS: ORAL_TABLET | ORAL | Status: DC

## 2014-08-27 MED ORDER — AMOXICILLIN-POT CLAVULANATE 875-125 MG PO TABS: 1.0000 | ORAL_TABLET | Freq: Two times a day (BID) | ORAL | Status: DC

## 2014-08-27 MED ORDER — HYDROCOD POLST-CHLORPHEN POLST 10-8 MG/5ML PO LQCR: 5.0000 mL | Freq: Every evening | ORAL | Status: DC | PRN

## 2014-08-27 NOTE — Patient Instructions (Signed)
Take augmentin as directed  Continue inhaler use If worse wheezing-fill px for prednisone and take it as directed  Drink lots of fluids and rest  Use tussionex as needed with caution of sedation   Keep thinking about quitting smoking  Update if not starting to improve in a week or if worsening

## 2014-08-27 NOTE — Assessment & Plan Note (Signed)
Recurrent in smoker not ready to quit (counseled)  augmentin and tussionex (with caution of sedation)  Fluids/ rest Disc symptomatic care - see instructions on AVS  Will fill px for prednisone if wheezing worsens - and update [place]  Update if not starting to improve in a week or if worsening

## 2014-08-27 NOTE — Progress Notes (Signed)
Subjective:    Patient ID: Robin Welch, female    DOB: 1966/06/13, 48 y.o.   MRN: [id number]  HPI 48 yo copd pt here for 5 d of bronchitis symptoms Severe coughing - all night long  Cough is not very productive  Has hx of copd and smoking - 2 ppd avg now   She wheezes in the am - and the inhaler takes care of it  02 is 98%  No fever   Not a lot of nasal symptoms   Patient Active Problem List   Diagnosis Date Noted  . COPD (chronic obstructive pulmonary disease) 09/22/2013  . COPD exacerbation 09/22/2013  . Abdominal pain, periumbilical 25/12/3974  . Tobacco abuse 12/18/2010  . Bipolar I disorder, most recent episode (or current) depressed, in partial or unspecified remission 12/18/2010  . Other screening mammogram 12/18/2010  . Healthcare maintenance 12/18/2010   Past Medical History  Diagnosis Date  . Bipolar 1 disorder     sees Dr. Candis Schatz psych [phone])  . History of tension headache   . History of migraines   . 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 Seizure     from HA medicine?   Marland Kitchen Hx: UTI (urinary tract infection)   . COPD (chronic obstructive pulmonary disease) 03/2013    hyperinflation by CXR  . Smoker    Past Surgical History  Procedure Laterality Date  . Cholecystectomy  2006  . Tubal ligation  2003   History  Substance Use Topics  . Smoking status: Current Every Day Smoker -- 2.00 packs/day    Types: Cigarettes  . Smokeless tobacco: Never Used     Comment: started age 58yo, tried chantix in past  . Alcohol Use: No   Family History  Problem Relation Age of Onset  . Diabetes Mother     T2DM  . Breast cancer Mother 55  . Coronary artery disease Father 39    MI  . Cancer Neg Hx   . Stroke Neg Hx    Allergies  Allergen Reactions  . Prozac [Fluoxetine Hcl] Swelling and Rash   Current Outpatient Prescriptions on File Prior to Visit  Medication Sig Dispense Refill  . albuterol (PROVENTIL HFA) 108 (90 BASE) MCG/ACT inhaler Inhale 2  puffs into the lungs every 6 (six) hours as needed for wheezing or shortness of breath. 1 Inhaler 11  . buPROPion (WELLBUTRIN XL) 150 MG 24 hr tablet Take 150 mg by mouth daily.    Marland Kitchen lamoTRIgine (LAMICTAL) 200 MG tablet Take 200 mg by mouth daily.    . QUEtiapine (SEROQUEL) 100 MG tablet Take 100 mg by mouth at bedtime.    . ziprasidone (GEODON) 60 MG capsule Take 80 mg by mouth 3 (three) times daily.      No current facility-administered medications on file prior to visit.      Review of Systems Review of Systems  Constitutional: Negative for fever, appetite change,  and unexpected weight change.  ENT pos for rhinorrhea mild Eyes: Negative for pain and visual disturbance.  Respiratory: Negative for  shortness of breath.  pos for cough and wheezing  Cardiovascular: Negative for cp or palpitations    Gastrointestinal: Negative for nausea, diarrhea and constipation.  Genitourinary: Negative for urgency and frequency.  Skin: Negative for pallor or rash   Neurological: Negative for weakness, light-headedness, numbness and headaches.  Hematological: Negative for adenopathy. Does not bruise/bleed easily.  Psychiatric/Behavioral: Negative for dysphoric mood. The patient is  not nervous/anxious.         Objective:   Physical Exam  Constitutional: She appears well-developed and well-nourished. No distress.  HENT:  Head: Normocephalic and atraumatic.  Right Ear: External ear normal.  Left Ear: External ear normal.  Mouth/Throat: Oropharynx is clear and moist. No oropharyngeal exudate.  Nares are injected and congested  No sinus tenderness  Throat clear Some scant clear rhinorrhea   Eyes: Conjunctivae and EOM are normal. Pupils are equal, round, and reactive to light. Right eye exhibits no discharge. Left eye exhibits no discharge.  Neck: Normal range of motion. Neck supple.  Cardiovascular: Regular rhythm and normal heart sounds.   Pulmonary/Chest: Effort normal. No respiratory  distress. She has wheezes. She has no rales.  Diffusely distant bs  Fair air exch  Wheeze on forced exp only  Lymphadenopathy:    She has no cervical adenopathy.  Neurological: She is alert.  Skin: Skin is warm and dry. No rash noted. No erythema.  Psychiatric: She has a normal mood and affect.          Assessment & Plan:   Problem List Items Addressed This Visit      Respiratory   COPD exacerbation - Primary    Recurrent in smoker not ready to quit (counseled)  augmentin and tussionex (with caution of sedation)  Fluids/ rest Disc symptomatic care - see instructions on AVS  Will fill px for prednisone if wheezing worsens - and update [place]  Update if not starting to improve in a week or if worsening        Relevant Medications      chlorpheniramine-HYDROcodone (TUSSIONEX) 10-8 MG/5ML LQCR      predniSONE (DELTASONE) tablet

## 2014-08-27 NOTE — Progress Notes (Signed)
Pre visit review using our clinic review tool, if applicable. No additional management support is needed unless otherwise documented below in the visit note. 

## 2014-09-03 ENCOUNTER — Telehealth: Payer: Self-pay

## 2014-09-03 NOTE — Telephone Encounter (Signed)
PLEASE NOTE: All timestamps contained within this report are represented as [place] Standard Time. CONFIDENTIALTY NOTICE: This fax transmission is intended only for the addressee. It contains information that is legally privileged, confidential or otherwise protected from use or disclosure. If you are not the intended recipient, you are strictly prohibited from reviewing, disclosing, copying using or disseminating any of this information or taking any action in reliance on or regarding this information. If you have received this fax in error, please notify us immediately by telephone so that we can arrange for its return to [place]. Phone: [phone], Toll-Free: [phone], Fax: [phone] Page: 1 of 3 Call Id: [id number] [facility] Patient Name: Robin Welch Gender: Female DOB: [birth year] Age: 49 Y 11 M 29 D Return Phone Number: [phone] (Primary) Address: [address] City/State/Zip: [place] [address] Client [facility] [facility] Night - Client Client Site [place] Physician Ria Bush Contact Type Call Call Type Triage / Clinical Relationship To Patient Self Return Phone Number [phone] (Primary) Chief Complaint Hoarseness (non urgent symptom) Initial Comment caller states she is on Augmentin and is traveling for the holidays and has lost it, would like it called in to pharm Nurse Assessment Nurse: Markus Daft, RN, Sherre Poot Date/Time ([place] Time): 09/01/2014 8:21:02 AM Confirm and document reason for call. If symptomatic, describe symptoms. ---Caller states she is on Augmentin BID for bronchitis, significant cough, since Wednesday, 08/29/14. She is traveling for the holidays and has lost it, would like it called in to pharmacy, and is back in town now. She didn't have any yesterday. No fever. -- She had it filled last at  [facility] -- [address] [place], [place] [address] Phone: [phone]. Has the patient traveled out of the country within the last 30 days? ---Not Applicable Does the patient require triage? ---Declined Triage Please document clinical information provided and list any resource used. ---RN will confirm at pharmacy and call in remaining doses of antibiotic if appropriate refill. She may have to pay for it out of pocket. Caller verb. understanding. (per directive ok to do a small refill once confirmed at pharmacy). Guidelines Guideline Title Affirmed Question Affirmed Notes Nurse Date/Time (Eastern Time) Disp. Time Eilene Ghazi Time) Disposition Final User 09/01/2014 8:09:39 AM Send To Clinical Follow Up Queue [facility] 09/01/2014 8:32:43 AM Pharmacy Call Markus Daft, [place], Sherre Poot Reason: Suezanne Jacquet, pharmacist, confirmed Augmentin 875 mg BID was filled on 08/27/2014 and so pt should be on her 6th day. RN approved 3 days since she did not have any of it yesterday. Provider that filled it was [facility]. 09/01/2014 8:32:54 AM Clinical Call Yes Markus Daft, RN, Sherre Poot PLEASE NOTE: All timestamps contained within this report are represented as [place] Standard Time. CONFIDENTIALTY NOTICE: This fax transmission is intended only for the addressee. It contains information that is legally privileged, confidential or otherwise protected from use or disclosure. If you are not the intended recipient, you are strictly prohibited from reviewing, disclosing, copying using or disseminating any of this information or taking any action in reliance on or regarding this information. If you have received this fax in error, please notify us immediately by telephone so that we can arrange for its return to [place]. Phone: [phone], Toll-Free: [phone], Fax: [phone] Page: 2 of 3 Call Id: [id number] After Care Instructions Given Call Event Type User Date / Time Description Verbal Orders/Maintenance  Medications Medication Refill Route Dosage Regime  Duration Admin Instructions User Name Augmentin 875 Yes Oral BID 3 Days #6 with no refills Markus Daft, RN, Sherre Poot PLEASE NOTE: All timestamps contained within this report are represented as [place] Standard Time. CONFIDENTIALTY NOTICE: This fax transmission is intended only for the addressee. It contains information that is legally privileged, confidential or otherwise protected from use or disclosure. If you are not the intended recipient, you are strictly prohibited from reviewing, disclosing, copying using or disseminating any of this information or taking any action in reliance on or regarding this information. If you have received this fax in error, please notify us immediately by telephone so that we can arrange for its return to [place]. Phone: [phone], Toll-Free: [phone], Fax: [phone] Page: 3 of 3 Call Id: [phone] [facility] [address] [place] [address] [phone] Fax: [phone] [facility] Night - Client [place] - Night Date: 09/01/2014 From: [facility] Department To: Ria Bush Please sign the order for the approved drug(s) given by our call center nurse on your behalf. Fax to [phone] within 5 business days. Thank you. Date Eilene Ghazi Time): 09/01/2014 8:04:52 AM Triage RN: Mayford Knife, RN NAME: [name] PHONE NUMBER: [phone] (Primary) BIRTHDATE: 11-Jan-1966 ADDRESS: [address] CITY/STATE/ZIP: [place] [address] CALLER: Self NAME: Rx Given Medication Refill Route Dosage Regime Duration Admin Instructions Augmentin 875 Yes Oral BID 3 Days #6 with no refills MD Signature Date

## 2014-10-19 ENCOUNTER — Encounter: Payer: Self-pay | Admitting: Family Medicine

## 2014-10-19 ENCOUNTER — Ambulatory Visit (INDEPENDENT_AMBULATORY_CARE_PROVIDER_SITE_OTHER): Payer: 59 | Admitting: Family Medicine

## 2014-10-19 VITALS — BP 108/60 | HR 96 | Temp 97.6°F | Wt 133.0 lb

## 2014-10-19 DIAGNOSIS — J42 Unspecified chronic bronchitis: Secondary | ICD-10-CM

## 2014-10-19 DIAGNOSIS — J209 Acute bronchitis, unspecified: Secondary | ICD-10-CM

## 2014-10-19 DIAGNOSIS — Z720 Tobacco use: Secondary | ICD-10-CM

## 2014-10-19 MED ORDER — ALBUTEROL SULFATE HFA 108 (90 BASE) MCG/ACT IN AERS: 2.0000 | INHALATION_SPRAY | Freq: Four times a day (QID) | RESPIRATORY_TRACT | Status: DC | PRN

## 2014-10-19 MED ORDER — DOXYCYCLINE HYCLATE 100 MG PO CAPS: 100.0000 mg | ORAL_CAPSULE | Freq: Two times a day (BID) | ORAL | Status: DC

## 2014-10-19 MED ORDER — HYDROCOD POLST-CHLORPHEN POLST 10-8 MG/5ML PO LQCR: 5.0000 mL | Freq: Every evening | ORAL | Status: DC | PRN

## 2014-10-19 NOTE — Patient Instructions (Addendum)
I think you have bronchitis and possible COPD flare - continue albuterol 2 puffs every 4-6 hours for next 1-2 days and then as needed. Treat bronchitis with doxycycline course. If not improving let me know next week for steroid course. tussionex for night time. Push fluids and rest. Return in 2 months for spirometry

## 2014-10-19 NOTE — Assessment & Plan Note (Signed)
Continue to encourage cessation. Contemplative. Did not tolerate chantix in past , will need to discuss NRT.

## 2014-10-19 NOTE — Progress Notes (Signed)
Pre visit review using our clinic review tool, if applicable. No additional management support is needed unless otherwise documented below in the visit note. 

## 2014-10-19 NOTE — Assessment & Plan Note (Signed)
Qualifies for chronic bronchitis dx based on recurrent bronchitis episodes over 2015.

## 2014-10-19 NOTE — Progress Notes (Signed)
BP 108/60 mmHg  Pulse 96  Temp(Src) 97.6 F (36.4 C) (Tympanic)  Wt 133 lb (60.328 kg)  SpO2 99%  LMP 08/18/2014   CC: bronchitis  Subjective:    Patient ID: [name], female    DOB: [birth year], 49 y.o.   MRN: [id number]  HPI: Glenna Brunkow is a 49 y.o. female presenting on 10/19/2014 for Bronchitis   7d h/o cough more productive of mucous. Cough mainly at night time. Trouble sleeping at night 2/2 cough. +ST from cough. + headaches. Lost albuterol inhaler - using husband's and 4 puffs at a time. Much more short winded. Last albuterol use was 7am.   No fevers/chills, ear or tooth pain, PNDrainage.   Has been treating with tussionex left over.   Smoking - 2 ppd.  Four bronchitis/COPD exacs 2015.  1st COPD exac this year.  Relevant past medical, surgical, family and social history reviewed and updated as indicated. Interim medical history since our last visit reviewed. Allergies and medications reviewed and updated. Current Outpatient Prescriptions on File Prior to Visit  Medication Sig  . buPROPion (WELLBUTRIN XL) 150 MG 24 hr tablet Take 150 mg by mouth daily.  Marland Kitchen lamoTRIgine (LAMICTAL) 200 MG tablet Take 200 mg by mouth daily.  . QUEtiapine (SEROQUEL) 100 MG tablet Take 100 mg by mouth at bedtime.  . ziprasidone (GEODON) 60 MG capsule Take 80 mg by mouth 3 (three) times daily.    No current facility-administered medications on file prior to visit.    Review of Systems Per HPI unless specifically indicated above     Objective:    BP 108/60 mmHg  Pulse 96  Temp(Src) 97.6 F (36.4 C) (Tympanic)  Wt 133 lb (60.328 kg)  SpO2 99%  LMP 08/18/2014  Wt Readings from Last 3 Encounters:  10/19/14 133 lb (60.328 kg)  08/27/14 130 lb (58.968 kg)  04/02/14 131 lb 8 oz (59.648 kg)    Physical Exam  Constitutional: She appears well-developed and well-nourished. No distress.  HENT:  Head: Normocephalic and atraumatic.  Right Ear: Hearing, tympanic membrane, external ear  and ear canal normal.  Left Ear: Hearing, tympanic membrane, external ear and ear canal normal.  Nose: No mucosal edema or rhinorrhea. Right sinus exhibits no maxillary sinus tenderness and no frontal sinus tenderness. Left sinus exhibits no maxillary sinus tenderness and no frontal sinus tenderness.  Mouth/Throat: Uvula is midline, oropharynx is clear and moist and mucous membranes are normal. No oropharyngeal exudate, posterior oropharyngeal edema, posterior oropharyngeal erythema or tonsillar abscesses.  Eyes: Conjunctivae and EOM are normal. Pupils are equal, round, and reactive to light. No scleral icterus.  Neck: Normal range of motion. Neck supple.  Cardiovascular: Normal rate, regular rhythm, normal heart sounds and intact distal pulses.   No murmur heard. Pulmonary/Chest: Effort normal and breath sounds normal. No respiratory distress. She has no wheezes. She has no rales.  Lungs clear today (albuterol inhaler done at 7am)  Lymphadenopathy:    She has no cervical adenopathy.  Skin: Skin is warm and dry. No rash noted.  Nursing note and vitals reviewed.     Assessment & Plan:   Problem List Items Addressed This Visit    Tobacco abuse    Continue to encourage cessation. Contemplative. Did not tolerate chantix in past , will need to discuss NRT.      COPD (chronic obstructive pulmonary disease)    Qualifies for chronic bronchitis dx based on recurrent bronchitis episodes over 2015.      Relevant  Medications   albuterol (PROVENTIL HFA) 108 (90 BASE) MCG/ACT inhaler   TUSSIONEX PENNKINETIC ER 8-10 MG/5ML PO LQCR   Acute bronchitis - Primary    Repeat bronchitis in smoker and ?COPD.  Treat with doxy course. Lungs clear - albuterol seems to be effective - recommend scheduled albuterol inhaler 2 puffs Q4-6 hours for next 1-2 days then prn. Refilled today. Advised to call us next week with update - if persistent trouble low threshold to prescribe prednisone course. I have asked her  to return in 2 mo for baseline spirometry. Will likely need daily controlled medication as she endorses regular use of albuterol inhaler. Pt agrees with plan.          Follow up plan: Return if symptoms worsen or fail to improve, for follow up visit.

## 2014-10-19 NOTE — Assessment & Plan Note (Signed)
Repeat bronchitis in smoker and ?COPD.  Treat with doxy course. Lungs clear - albuterol seems to be effective - recommend scheduled albuterol inhaler 2 puffs Q4-6 hours for next 1-2 days then prn. Refilled today. Advised to call us next week with update - if persistent trouble low threshold to prescribe prednisone course. I have asked her to return in 2 mo for baseline spirometry. Will likely need daily controlled medication as she endorses regular use of albuterol inhaler. Pt agrees with plan.

## 2014-10-31 ENCOUNTER — Telehealth: Payer: Self-pay | Admitting: Family Medicine

## 2014-10-31 NOTE — Telephone Encounter (Signed)
Pt was prescribed an antibiotic starting Saturday she started to not feel well and was vomiting and has stopped taking it at that point, and the nausea and vomiting has stopped since she has stopped taking the medication

## 2014-10-31 NOTE — Telephone Encounter (Signed)
Spoke with patient and confirmed it was doxycycline. Added to allergy list.

## 2014-11-29 ENCOUNTER — Telehealth: Payer: Self-pay

## 2014-11-29 NOTE — Telephone Encounter (Signed)
Pt requesting refill for inhaler; pt to ck with pharmacy refills should be available; pt voiced understanding.

## 2014-12-07 ENCOUNTER — Encounter: Payer: Self-pay | Admitting: Family Medicine

## 2014-12-07 ENCOUNTER — Ambulatory Visit (INDEPENDENT_AMBULATORY_CARE_PROVIDER_SITE_OTHER): Payer: 59 | Admitting: Family Medicine

## 2014-12-07 VITALS — BP 114/80 | HR 86 | Temp 97.6°F | Ht 61.25 in | Wt 135.5 lb

## 2014-12-07 DIAGNOSIS — J42 Unspecified chronic bronchitis: Secondary | ICD-10-CM

## 2014-12-07 DIAGNOSIS — J441 Chronic obstructive pulmonary disease with (acute) exacerbation: Secondary | ICD-10-CM

## 2014-12-07 DIAGNOSIS — B373 Candidiasis of vulva and vagina: Secondary | ICD-10-CM | POA: Diagnosis not present

## 2014-12-07 DIAGNOSIS — B3731 Acute candidiasis of vulva and vagina: Secondary | ICD-10-CM

## 2014-12-07 MED ORDER — HYDROCODONE-HOMATROPINE 5-1.5 MG/5ML PO SYRP: 5.0000 mL | ORAL_SOLUTION | Freq: Every evening | ORAL | Status: DC | PRN

## 2014-12-07 MED ORDER — FLUCONAZOLE 150 MG PO TABS: 150.0000 mg | ORAL_TABLET | Freq: Once | ORAL | Status: DC

## 2014-12-07 MED ORDER — AMOXICILLIN-POT CLAVULANATE 875-125 MG PO TABS: 1.0000 | ORAL_TABLET | Freq: Two times a day (BID) | ORAL | Status: DC

## 2014-12-07 MED ORDER — PREDNISONE 20 MG PO TABS: ORAL_TABLET | ORAL | Status: DC

## 2014-12-07 NOTE — Progress Notes (Signed)
Pre visit review using our clinic review tool, if applicable. No additional management support is needed unless otherwise documented below in the visit note. 

## 2014-12-07 NOTE — Patient Instructions (Addendum)
Make sure to schedule appt with Dr. Darnell Level when spirometry working for further eval of COPD control. Quit smoking as discussed. Cough suppressant at night. Mucinex DM during day for cough.  Prednsione taper.  Complete course of antibiotics.  Use albuterol PRN wheeze.  If not improving in 48-72 hours, call. Go to ER for severe shortness of breath.

## 2014-12-07 NOTE — Progress Notes (Signed)
   Subjective:    Patient ID: Robin Welch, female    DOB: [birth year], 49 y.o.   MRN: [id number]  HPI  49 year old female  Pt of Dr.G with history of COPD, tobacco abuse presents with new onset  Had COPD exac in 07/2014, 10/2014. Treated with doxy ( had SE to this) and albuterol prn.   She reports that she never fully got over her symptoms in 10/2014. She has had cough, productive in AMs, worse in last week.  No fever. SOB and wheeze. No  ST, sinus pain, or ear pain.  She is using albuterol 2-4 hours, waking up at night with SOB. Helps temporarily.   She did take 2.5 days of augmentin yesterday, helped some. Gave her a yeast infection.  She is still smoking 2 packs a day. She has tried chantix (gave her mood SE). She is on wellbutrin.  She wears a patch during work days.    Review of Systems  Constitutional: Negative for fever and fatigue.  HENT: Negative for ear pain.   Eyes: Negative for pain.  Respiratory: Positive for cough. Negative for chest tightness and shortness of breath.   Cardiovascular: Negative for chest pain, palpitations and leg swelling.  Gastrointestinal: Negative for abdominal pain.  Genitourinary: Negative for dysuria.       Objective:   Physical Exam  Constitutional: Vital signs are normal. She appears well-developed and well-nourished. She is cooperative.  Non-toxic appearance. She does not appear ill. No distress.  HENT:  Head: Normocephalic.  Right Ear: Hearing, tympanic membrane, external ear and ear canal normal. Tympanic membrane is not erythematous, not retracted and not bulging.  Left Ear: Hearing, tympanic membrane, external ear and ear canal normal. Tympanic membrane is not erythematous, not retracted and not bulging.  Nose: Mucosal edema and rhinorrhea present. Right sinus exhibits no maxillary sinus tenderness and no frontal sinus tenderness. Left sinus exhibits no maxillary sinus tenderness and no frontal sinus tenderness.  Mouth/Throat: Uvula is  midline, oropharynx is clear and moist and mucous membranes are normal.  Eyes: Conjunctivae, EOM and lids are normal. Pupils are equal, round, and reactive to light. Lids are everted and swept, no foreign bodies found.  Neck: Trachea normal and normal range of motion. Neck supple. Carotid bruit is not present. No thyroid mass and no thyromegaly present.  Cardiovascular: Normal rate, regular rhythm, S1 normal, S2 normal, normal heart sounds, intact distal pulses and normal pulses.  Exam reveals no gallop and no friction rub.   No murmur heard. Pulmonary/Chest: Effort normal. No tachypnea. No respiratory distress. She has no decreased breath sounds. She has wheezes. She has no rhonchi. She has no rales.  Neurological: She is alert.  Skin: Skin is warm, dry and intact. No rash noted.  Psychiatric: Her speech is normal and behavior is normal. Judgment normal. Her mood appears not anxious. Cognition and memory are normal. She does not exhibit a depressed mood.          Assessment & Plan:

## 2014-12-18 ENCOUNTER — Telehealth: Payer: Self-pay

## 2014-12-18 NOTE — Telephone Encounter (Signed)
Pt thinks she lost the albuterol inhaler rx given to pt. Advised pt albuterol inhaler was sent electronically to [facility] 10/2014. Pt will ck with pharmacy.

## 2014-12-24 ENCOUNTER — Ambulatory Visit: Payer: 59 | Admitting: Family Medicine

## 2014-12-25 ENCOUNTER — Ambulatory Visit: Payer: 59 | Admitting: Family Medicine

## 2014-12-27 ENCOUNTER — Ambulatory Visit: Payer: 59 | Admitting: Family Medicine

## 2015-01-08 DIAGNOSIS — B373 Candidiasis of vulva and vagina: Secondary | ICD-10-CM | POA: Insufficient documentation

## 2015-01-08 DIAGNOSIS — B3731 Acute candidiasis of vulva and vagina: Secondary | ICD-10-CM | POA: Insufficient documentation

## 2015-01-08 NOTE — Assessment & Plan Note (Signed)
Cough suppressant at night. Mucinex DM during day for cough.  Prednsione taper.  Complete course of antibiotics.  Use albuterol PRN wheeze.  If not improving in 48-72 hours, call. Go to ER for severe shortness of breath.

## 2015-01-08 NOTE — Assessment & Plan Note (Signed)
Make sure to schedule appt with Dr. Darnell Level when spirometry working for further eval of COPD control. Quit smoking as discussed.

## 2015-01-08 NOTE — Assessment & Plan Note (Signed)
Treat with diflucan. 

## 2015-01-23 ENCOUNTER — Encounter: Payer: Self-pay | Admitting: Family Medicine

## 2015-01-23 ENCOUNTER — Ambulatory Visit (INDEPENDENT_AMBULATORY_CARE_PROVIDER_SITE_OTHER): Payer: 59 | Admitting: Family Medicine

## 2015-01-23 VITALS — BP 98/62 | HR 99 | Temp 98.0°F | Wt 128.8 lb

## 2015-01-23 DIAGNOSIS — Z720 Tobacco use: Secondary | ICD-10-CM

## 2015-01-23 DIAGNOSIS — IMO0002 Reserved for concepts with insufficient information to code with codable children: Secondary | ICD-10-CM

## 2015-01-23 DIAGNOSIS — J441 Chronic obstructive pulmonary disease with (acute) exacerbation: Secondary | ICD-10-CM

## 2015-01-23 DIAGNOSIS — F3175 Bipolar disorder, in partial remission, most recent episode depressed: Secondary | ICD-10-CM

## 2015-01-23 DIAGNOSIS — R062 Wheezing: Secondary | ICD-10-CM

## 2015-01-23 MED ORDER — AMOXICILLIN-POT CLAVULANATE 875-125 MG PO TABS: 1.0000 | ORAL_TABLET | Freq: Two times a day (BID) | ORAL | Status: DC

## 2015-01-23 MED ORDER — HYDROCOD POLST-CPM POLST ER 10-8 MG/5ML PO SUER: 5.0000 mL | Freq: Every evening | ORAL | Status: DC | PRN

## 2015-01-23 MED ORDER — ALBUTEROL SULFATE HFA 108 (90 BASE) MCG/ACT IN AERS: 2.0000 | INHALATION_SPRAY | Freq: Four times a day (QID) | RESPIRATORY_TRACT | Status: DC | PRN

## 2015-01-23 MED ORDER — PREDNISONE 20 MG PO TABS: ORAL_TABLET | ORAL | Status: DC

## 2015-01-23 NOTE — Assessment & Plan Note (Signed)
Deteriorated. Advised pt to continue her meds and to call her psychiatrist today.  I have never met her in past but she did seem a little pressured in her speech today.

## 2015-01-23 NOTE — Patient Instructions (Signed)
Nice to meet you. Please take you prednisone and Augmentin as directed. Proair (albuterol) as needed for cough, wheezing or shortness of breath. Tussionex as needed for cough at bedtime- this will make you sleepy.  Please follow up with Dr. Darnell Level in 2 weeks.

## 2015-01-23 NOTE — Assessment & Plan Note (Signed)
New- O2 sats reassuring but rhonchi and wheezing on exam. Augmentin as directed- cannot tolerate doxycycline and possible drug interaction with her psych meds and zpack. Prednisone taper. Tussionex rx given for prn cough at bedtime. Follow up with Dr. Darnell Level in 2 weeks. The patient indicates understanding of these issues and agrees with the plan.

## 2015-01-23 NOTE — Progress Notes (Signed)
Subjective:   Patient ID: Robin Welch, female    DOB: 07-03-1966, 49 y.o.   MRN: [id number]  Robin Welch is a pleasant 49 y.o. year old female pt of Dr. Darnell Level with h/o Bipolar, COPD and tobacco abuse, new to me, who presents to clinic today with Cough  on 01/23/2015  HPI:  Treated for COPD exacerbation last month- Dr. Diona Browner- note reviewed. Treated with prednisone taper and doxycyline.  Doxycycline made her vomit. Advised to follow up with PCP which she did not do. In reviewing her chart, has been treated multiple times recently for similar symptoms- 07/2014, 10/2014, 11/2014.  Increased cough and shortness of breath past two weeks.  Not sleeping at night due to cough which is making her bipolar symptoms worse.  Denies SI or HI, just feels "off."  Current Outpatient Prescriptions on File Prior to Visit  Medication Sig Dispense Refill  . ALPRAZolam (XANAX) 1 MG tablet Take 1 mg by mouth 2 (two) times daily as needed for anxiety.    Marland Kitchen buPROPion (WELLBUTRIN XL) 150 MG 24 hr tablet Take 150 mg by mouth daily.    Marland Kitchen lamoTRIgine (LAMICTAL) 200 MG tablet Take 200 mg by mouth daily.    . QUEtiapine (SEROQUEL) 100 MG tablet Take 100 mg by mouth at bedtime.    . ziprasidone (GEODON) 60 MG capsule Take 80 mg by mouth 3 (three) times daily.      No current facility-administered medications on file prior to visit.    Allergies  Allergen Reactions  . Prozac [Fluoxetine Hcl] Swelling and Rash  . Doxycycline Nausea And Vomiting    Past Medical History  Diagnosis Date  . Bipolar 1 disorder     sees Dr. Candis Schatz psych [phone])  . History of tension headache   . History of migraines   . 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 Seizure     from HA medicine?   Marland Kitchen Hx: UTI (urinary tract infection)   . COPD (chronic obstructive pulmonary disease) 03/2013    hyperinflation by CXR  . Smoker     Past Surgical History  Procedure Laterality Date  . Cholecystectomy  2006  . Tubal ligation  2003     Family History  Problem Relation Age of Onset  . Diabetes Mother     T2DM  . Breast cancer Mother 56  . Coronary artery disease Father 1    MI  . Cancer Neg Hx   . Stroke Neg Hx     History   Social History  . Marital Status: Married    Spouse Name: N/A  . Number of Children: 2  . 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 Smoking status: Current Every Day Smoker -- 2.00 packs/day    Types: Cigarettes  . Smokeless tobacco: Never Used     Comment: started age 31yo, tried chantix in past  . Alcohol Use: No  . Drug Use: No  . Sexual Activity: Not on file   Other Topics Concern  . Not on file   Social History Narrative   Caffeine: 40 oz diet mountain dew/day   Lives with husband and 2 children, no pets         The PMH, PSH, Social History, Family History, Medications, and allergies have been reviewed in [facility], and have been updated  if relevant.   Review of Systems  Constitutional: Positive for fatigue. Negative for fever.  HENT: Positive for congestion.   Respiratory: Positive for cough, shortness of breath and wheezing.   Cardiovascular: Negative.   Gastrointestinal: Negative.   Endocrine: Negative.   Genitourinary: Negative.   Musculoskeletal: Negative.   Skin: Negative.   Neurological: Negative.   Hematological: Negative.   Psychiatric/Behavioral: Negative.   All other systems reviewed and are negative.      Objective:    BP 98/62 mmHg  Pulse 99  Temp(Src) 98 F (36.7 C) (Oral)  Wt 128 lb 12 oz (58.401 kg)  SpO2 97%  LMP 01/04/2015   Physical Exam  Constitutional: She appears well-developed and well-nourished. No distress.  HENT:  Head: Normocephalic.  Eyes: Conjunctivae are normal.  Neck: Normal range of motion.  Cardiovascular: Tachycardia present.   Pulmonary/Chest: She has wheezes in the right upper field, the right  middle field, the left middle field and the left lower field. She has rhonchi in the right lower field. She has no rales.  Musculoskeletal: She exhibits no edema.  Neurological: She is alert. No cranial nerve deficit.  Skin: Skin is warm and dry.  Psychiatric: She has a normal mood and affect. Her behavior is normal. Judgment and thought content normal.  Nursing note and vitals reviewed.         Assessment & Plan:   COPD exacerbation No Follow-up on file.

## 2015-01-23 NOTE — Progress Notes (Signed)
Pre visit review using our clinic review tool, if applicable. No additional management support is needed unless otherwise documented below in the visit note. 

## 2015-02-14 ENCOUNTER — Ambulatory Visit: Payer: 59 | Admitting: Family Medicine

## 2015-02-14 ENCOUNTER — Encounter: Payer: Self-pay | Admitting: Family Medicine

## 2015-02-14 ENCOUNTER — Ambulatory Visit (INDEPENDENT_AMBULATORY_CARE_PROVIDER_SITE_OTHER): Payer: 59 | Admitting: Family Medicine

## 2015-02-14 VITALS — BP 118/62 | HR 98 | Temp 97.4°F | Wt 122.2 lb

## 2015-02-14 DIAGNOSIS — Z720 Tobacco use: Secondary | ICD-10-CM

## 2015-02-14 DIAGNOSIS — J42 Unspecified chronic bronchitis: Secondary | ICD-10-CM | POA: Diagnosis not present

## 2015-02-14 DIAGNOSIS — J441 Chronic obstructive pulmonary disease with (acute) exacerbation: Secondary | ICD-10-CM | POA: Diagnosis not present

## 2015-02-14 DIAGNOSIS — F3175 Bipolar disorder, in partial remission, most recent episode depressed: Secondary | ICD-10-CM

## 2015-02-14 DIAGNOSIS — IMO0002 Reserved for concepts with insufficient information to code with codable children: Secondary | ICD-10-CM

## 2015-02-14 MED ORDER — ALBUTEROL SULFATE HFA 108 (90 BASE) MCG/ACT IN AERS: 2.0000 | INHALATION_SPRAY | Freq: Four times a day (QID) | RESPIRATORY_TRACT | Status: DC | PRN

## 2015-02-14 MED ORDER — ALPRAZOLAM 2 MG PO TABS: 2.0000 mg | ORAL_TABLET | Freq: Three times a day (TID) | ORAL | Status: DC | PRN

## 2015-02-14 MED ORDER — PREDNISONE 20 MG PO TABS: ORAL_TABLET | ORAL | Status: DC

## 2015-02-14 MED ORDER — HYDROCOD POLST-CPM POLST ER 10-8 MG/5ML PO SUER: 5.0000 mL | Freq: Every evening | ORAL | Status: DC | PRN

## 2015-02-14 MED ORDER — FLUTICASONE-SALMETEROL 250-50 MCG/DOSE IN AEPB: 1.0000 | INHALATION_SPRAY | Freq: Two times a day (BID) | RESPIRATORY_TRACT | Status: DC

## 2015-02-14 NOTE — Assessment & Plan Note (Signed)
Action phase - worried about her breathing and recurrent bronchitis. Discussed ways to quit smoking, thinks she will have to find new routine - current routine consists of sitting in her chair, drinking diet mtdew and chain smoking. Thinks she will use patch again.

## 2015-02-14 NOTE — Assessment & Plan Note (Signed)
Mild wheezing on exam. Baseline mild dyspnea. Anticipate COPD exacerbation. No evidence of bacterial infection today however. Will treat with another 10d prednisone course which pt states is effective. tussionex nightly for cough and continue albuterol inhaler. Start advair inhaler.  Discussed COPD concern as well as continued smoking.  Pt will return in next few months when breathing back to baseline for baseline spirometry.

## 2015-02-14 NOTE — Progress Notes (Signed)
BP 118/62 mmHg  Pulse 98  Temp(Src) 97.4 F (36.3 C) (Oral)  Wt 122 lb 4 oz (55.452 kg)  SpO2 99%  LMP 01/04/2015   CC: f/u bronchitis  Subjective:    Patient ID: Robin Welch, female    DOB: 1966/08/07, 49 y.o.   MRN: [id number]  HPI: Robin Welch is a 49 y.o. female presenting on 02/14/2015 for Bronchitis   Seen here 01/23/2015 with dx COPD exacerbation. Treated with augmentin and prednisone taper and tussionex Rx for night time cough. This improved sxs but never fully resolved. Coughing nightly to point of voiding, urinary incontinence from coughing, trouble sleeping. Taking albuterol 3-4 times daily.  Waking up at night from cough. Persistent coughing and wheezing with productive sputum. Staying mildly short winded but has difficulty distinguishing this from her baseline.   No fever/chills.  Remotely on advair but not in last 10 yrs.  Smoking - 2ppd. Wants to quit. Has used patch in the past.  Reviewing chart, had COPD exac treated with prednisone and doxy 11/2014. Also treated for similar sxs 10/2014, 08/2015.  Bipolar - just found out Dr Robin Welch has gone to inpatient group. Having trouble finding new psychiatrist who will take [place] [facility]. Requests 7d script for xanax while she awaits for mail order supply.  Relevant past medical, surgical, family and social history reviewed and updated as indicated. Interim medical history since our last visit reviewed. Allergies and medications reviewed and updated. Current Outpatient Prescriptions on File Prior to Visit  Medication Sig  . buPROPion (WELLBUTRIN XL) 150 MG 24 hr tablet Take 150 mg by mouth daily.  Marland Kitchen lamoTRIgine (LAMICTAL) 200 MG tablet Take 200 mg by mouth daily.   No current facility-administered medications on file prior to visit.   Past Medical History  Diagnosis Date  . Bipolar 1 disorder     sees Dr. Candis Welch psych [phone])  . History of tension headache   . History of migraines   . Positive PPD 1989   s/p CXR and INH 6 mo  . Seizure     from [facility] medicine?   Marland Kitchen Hx: UTI (urinary tract infection)   . COPD (chronic obstructive pulmonary disease) 03/2013    hyperinflation by CXR  . Smoker     Past Surgical History  Procedure Laterality Date  . Cholecystectomy  2006  . Tubal ligation  2003   History  Substance Use Topics  . Smoking status: Current Every Day Smoker -- 2.00 packs/day    Types: Cigarettes  . Smokeless tobacco: Never Used     Comment: started age 71yo, tried chantix in past  . Alcohol Use: No    Review of Systems Per HPI unless specifically indicated above     Objective:    BP 118/62 mmHg  Pulse 98  Temp(Src) 97.4 F (36.3 C) (Oral)  Wt 122 lb 4 oz (55.452 kg)  SpO2 99%  LMP 01/04/2015  Wt Readings from Last 3 Encounters:  02/14/15 122 lb 4 oz (55.452 kg)  01/23/15 128 lb 12 oz (58.401 kg)  12/07/14 135 lb 8 oz (61.462 kg)    Physical Exam  Constitutional: She appears well-developed and well-nourished. No distress.  HENT:  Mouth/Throat: Oropharynx is clear and moist. No oropharyngeal exudate.  Cardiovascular: Normal rate, regular rhythm, normal heart sounds and intact distal pulses.   No murmur heard. Pulmonary/Chest: Effort normal. No respiratory distress. She has wheezes. She has no rales.  Skin: Skin is warm and dry. No rash noted.  Psychiatric:  Baseline mildly pressured speech  Nursing note and vitals reviewed.     Assessment & Plan:   Problem List Items Addressed This Visit    Bipolar I disorder, most recent episode (or current) depressed, in partial or unspecified remission    Psychiatrist has left practice. Having trouble finding new psychiatrist. Will refer to our referral coordinator to assist in process of finding new psychiatrist. 7d xanax script provided today #30 no refills while she gets mail order meds.      Relevant Medications   alprazolam (XANAX) 2 MG tablet   COPD (chronic obstructive pulmonary disease)    RTC for  spirometry. Discussed concern for recurrent bronchitis leading to worsening lung function and how each time she gets bronchitis lung function deteriorates.      Relevant Medications   albuterol (PROVENTIL HFA;VENTOLIN HFA) 108 (90 BASE) MCG/ACT inhaler   predniSONE (DELTASONE) 20 MG tablet   Fluticasone-Salmeterol (ADVAIR DISKUS) 250-50 MCG/DOSE AEPB   chlorpheniramine-HYDROcodone (TUSSIONEX PENNKINETIC ER) 10-8 MG/5ML SUER   COPD exacerbation - Primary    Mild wheezing on exam. Baseline mild dyspnea. Anticipate COPD exacerbation. No evidence of bacterial infection today however. Will treat with another 10d prednisone course which pt states is effective. tussionex nightly for cough and continue albuterol inhaler. Start advair inhaler.  Discussed COPD concern as well as continued smoking.  Pt will return in next few months when breathing back to baseline for baseline spirometry.      Relevant Medications   albuterol (PROVENTIL HFA;VENTOLIN HFA) 108 (90 BASE) MCG/ACT inhaler   predniSONE (DELTASONE) 20 MG tablet   Fluticasone-Salmeterol (ADVAIR DISKUS) 250-50 MCG/DOSE AEPB   chlorpheniramine-HYDROcodone (TUSSIONEX PENNKINETIC ER) 10-8 MG/5ML SUER   Tobacco abuse    Action phase - worried about her breathing and recurrent bronchitis. Discussed ways to quit smoking, thinks she will have to find new routine - current routine consists of sitting in her chair, drinking diet mtdew and chain smoking. Thinks she will use patch again.          Follow up plan: Return if symptoms worsen or fail to improve.

## 2015-02-14 NOTE — Patient Instructions (Addendum)
Pass by Robin Welch's office for referral to new psychiatrist.  I think we have mild persistent COPD exacerbation. Treat with prednisone course.  Use tussionex for night time cough. Start advair inhaler, continue albuterol inhaler. You need to quit smoking Let us know if fever >101 or worsening symptoms. Return for spirometry when breathing back to baseline.

## 2015-02-14 NOTE — Progress Notes (Signed)
Pre visit review using our clinic review tool, if applicable. No additional management support is needed unless otherwise documented below in the visit note. 

## 2015-02-14 NOTE — Assessment & Plan Note (Addendum)
RTC for spirometry. Discussed concern for recurrent bronchitis leading to worsening lung function and how each time she gets bronchitis lung function deteriorates.

## 2015-02-14 NOTE — Addendum Note (Signed)
Addended by: Ria Bush on: 02/14/2015 11:28 AM   Modules accepted: Orders

## 2015-02-14 NOTE — Assessment & Plan Note (Signed)
Psychiatrist has left practice. Having trouble finding new psychiatrist. Will refer to our referral coordinator to assist in process of finding new psychiatrist. 7d xanax script provided today #30 no refills while she gets mail order meds.

## 2015-03-08 ENCOUNTER — Ambulatory Visit (INDEPENDENT_AMBULATORY_CARE_PROVIDER_SITE_OTHER): Payer: 59 | Admitting: Family Medicine

## 2015-03-08 ENCOUNTER — Ambulatory Visit (INDEPENDENT_AMBULATORY_CARE_PROVIDER_SITE_OTHER)
Admission: RE | Admit: 2015-03-08 | Discharge: 2015-03-08 | Disposition: A | Discharge status: Home / Self Care | Payer: 59 | Source: Ambulatory Visit | Attending: Family Medicine | Admitting: Family Medicine

## 2015-03-08 ENCOUNTER — Encounter: Payer: Self-pay | Admitting: Family Medicine

## 2015-03-08 VITALS — BP 118/58 | HR 100 | Temp 97.4°F | Ht 61.25 in | Wt 116.0 lb

## 2015-03-08 DIAGNOSIS — R6889 Other general symptoms and signs: Secondary | ICD-10-CM | POA: Diagnosis not present

## 2015-03-08 DIAGNOSIS — Z720 Tobacco use: Secondary | ICD-10-CM | POA: Diagnosis not present

## 2015-03-08 DIAGNOSIS — R053 Chronic cough: Secondary | ICD-10-CM

## 2015-03-08 DIAGNOSIS — R634 Abnormal weight loss: Secondary | ICD-10-CM

## 2015-03-08 DIAGNOSIS — L819 Disorder of pigmentation, unspecified: Secondary | ICD-10-CM | POA: Insufficient documentation

## 2015-03-08 DIAGNOSIS — R05 Cough: Secondary | ICD-10-CM | POA: Diagnosis not present

## 2015-03-08 LAB — CBC WITH DIFFERENTIAL/PLATELET
BASOS PCT: 0.4 % (ref 0.0–3.0)
Basophils Absolute: 0 10*3/uL (ref 0.0–0.1)
EOS PCT: 0.7 % (ref 0.0–5.0)
Eosinophils Absolute: 0.1 10*3/uL (ref 0.0–0.7)
HCT: 39.8 % (ref 36.0–46.0)
Hemoglobin: 13.1 g/dL (ref 12.0–15.0)
LYMPHS PCT: 28.2 % (ref 12.0–46.0)
Lymphs Abs: 2 10*3/uL (ref 0.7–4.0)
MCHC: 32.8 g/dL (ref 30.0–36.0)
MCV: 87.9 fl (ref 78.0–100.0)
MONO ABS: 0.6 10*3/uL (ref 0.1–1.0)
MONOS PCT: 7.8 % (ref 3.0–12.0)
Neutro Abs: 4.5 10*3/uL (ref 1.4–7.7)
Neutrophils Relative %: 62.9 % (ref 43.0–77.0)
PLATELETS: 566 10*3/uL — AB (ref 150.0–400.0)
RBC: 4.53 Mil/uL (ref 3.87–5.11)
RDW: 15.6 % — ABNORMAL HIGH (ref 11.5–15.5)
WBC: 7.2 10*3/uL (ref 4.0–10.5)

## 2015-03-08 LAB — TSH: TSH: 1.58 u[IU]/mL (ref 0.35–4.50)

## 2015-03-08 LAB — T3, FREE: T3 FREE: 4.1 pg/mL (ref 2.3–4.2)

## 2015-03-08 LAB — T4, FREE: Free T4: 1.02 ng/dL (ref 0.60–1.60)

## 2015-03-08 NOTE — Progress Notes (Signed)
Subjective:    Patient ID: Robin Welch, female    DOB: 07-Nov-1965, 49 y.o.   MRN: [id number]  HPI  49 year old female pt  with bipolar disorder of Dr. Synthia Innocent presents with new  onset rash on back of bilateral legs 2 weeks ago.  Daughter noted mottled skin on back of legs.  Uses heating blanket on legs a lot.  No itching. No pain.  She is cold all the time. Wears a sweat shirt everyday.  No claudication.  She has lost 20 lbs in last 3 month .  She doesn't  Eat lunch now as much as previously. Wt Readings from Last 3 Encounters:  03/08/15 116 lb (52.617 kg)  02/14/15 122 lb 4 oz (55.452 kg)  01/23/15 128 lb 12 oz (58.401 kg)    Recent addition of advair. No other med changes.  She is smoker with history of COPD. Social History /Family History/Past Medical History reviewed and updated if needed.  no thyroid problems.  Last CXR 2 years ago.    Review of Systems  Constitutional: Positive for unexpected weight change. Negative for fever and fatigue.  HENT: Negative for ear pain.   Eyes: Negative for redness.  Respiratory: Positive for cough. Negative for shortness of breath.        She does have chronic cough.  Cardiovascular: Negative for chest pain and leg swelling.  Gastrointestinal: Negative for blood in stool.       Objective:   Physical Exam  Constitutional: Vital signs are normal. She appears well-developed and well-nourished. She is cooperative.  Non-toxic appearance. She does not appear ill. No distress.  Thin appearing female with hoarse voice  HENT:  Head: Normocephalic.  Right Ear: Hearing, tympanic membrane, external ear and ear canal normal. Tympanic membrane is not erythematous, not retracted and not bulging.  Left Ear: Hearing, tympanic membrane, external ear and ear canal normal. Tympanic membrane is not erythematous, not retracted and not bulging.  Nose: No mucosal edema or rhinorrhea. Right sinus exhibits no maxillary sinus tenderness and no frontal sinus  tenderness. Left sinus exhibits no maxillary sinus tenderness and no frontal sinus tenderness.  Mouth/Throat: Uvula is midline, oropharynx is clear and moist and mucous membranes are normal.  Eyes: Conjunctivae, EOM and lids are normal. Pupils are equal, round, and reactive to light. Lids are everted and swept, no foreign bodies found.  Neck: Trachea normal and normal range of motion. Neck supple. Carotid bruit is not present. No thyroid mass and no thyromegaly present.  Cardiovascular: Normal rate, regular rhythm, S1 normal, S2 normal, normal heart sounds, intact distal pulses and normal pulses.  Exam reveals no gallop and no friction rub.   No murmur heard. Pulmonary/Chest: Effort normal. No tachypnea. No respiratory distress. She has decreased breath sounds. She has no wheezes. She has no rhonchi. She has no rales.  Abdominal: Soft. Normal appearance and bowel sounds are normal. There is no tenderness.  Neurological: She is alert.  Skin: Skin is warm, dry and intact. No rash noted.  Mottled skin but only on back of legs.  Psychiatric: Her speech is normal and behavior is normal. Judgment and thought content normal. Her mood appears not anxious. Cognition and memory are normal. She does not exhibit a depressed mood.          Assessment & Plan:  Mottled skin: likely due to  Heating blanket used on back of legs.. ususual as it is only on back of legs.  Will eval with labs.  Unexpected weight loss in smoker with new chronic cough.., check CXR, cbc and TSH. If negative may be due to skipping meals, encouraged regualr meals.  Will need follow up on chronic cough with PCP.Marland Kitchen With spirometry for eval of new start adviar.

## 2015-03-08 NOTE — Progress Notes (Signed)
Pre visit review using our clinic review tool, if applicable. No additional management support is needed unless otherwise documented below in the visit note. 

## 2015-03-08 NOTE — Patient Instructions (Signed)
Stop at lab and X-ray on way out. We will call with the results.

## 2015-03-13 ENCOUNTER — Other Ambulatory Visit: Payer: Self-pay | Admitting: Family Medicine

## 2015-03-13 DIAGNOSIS — R7989 Other specified abnormal findings of blood chemistry: Secondary | ICD-10-CM

## 2015-03-18 ENCOUNTER — Other Ambulatory Visit (INDEPENDENT_AMBULATORY_CARE_PROVIDER_SITE_OTHER): Payer: 59

## 2015-03-18 DIAGNOSIS — R7989 Other specified abnormal findings of blood chemistry: Secondary | ICD-10-CM

## 2015-03-18 LAB — CBC WITH DIFFERENTIAL/PLATELET
BASOS ABS: 0.1 10*3/uL (ref 0.0–0.1)
BASOS PCT: 0.4 % (ref 0.0–3.0)
Eosinophils Absolute: 0.2 10*3/uL (ref 0.0–0.7)
Eosinophils Relative: 1.2 % (ref 0.0–5.0)
HCT: 32.5 % — ABNORMAL LOW (ref 36.0–46.0)
HEMOGLOBIN: 11 g/dL — AB (ref 12.0–15.0)
LYMPHS ABS: 1.8 10*3/uL (ref 0.7–4.0)
Lymphocytes Relative: 11.3 % — ABNORMAL LOW (ref 12.0–46.0)
MCHC: 33.8 g/dL (ref 30.0–36.0)
MCV: 87.6 fl (ref 78.0–100.0)
MONO ABS: 0.9 10*3/uL (ref 0.1–1.0)
Monocytes Relative: 6 % (ref 3.0–12.0)
NEUTROS ABS: 12.8 10*3/uL — AB (ref 1.4–7.7)
Neutrophils Relative %: 81.1 % — ABNORMAL HIGH (ref 43.0–77.0)
Platelets: 522 10*3/uL — ABNORMAL HIGH (ref 150.0–400.0)
RBC: 3.72 Mil/uL — ABNORMAL LOW (ref 3.87–5.11)
RDW: 16.1 % — ABNORMAL HIGH (ref 11.5–15.5)
WBC: 15.8 10*3/uL — AB (ref 4.0–10.5)

## 2015-03-19 ENCOUNTER — Telehealth: Payer: Self-pay | Admitting: *Deleted

## 2015-03-19 ENCOUNTER — Other Ambulatory Visit: Payer: 59

## 2015-03-19 LAB — PATHOLOGIST SMEAR REVIEW

## 2015-03-19 MED ORDER — AZITHROMYCIN 250 MG PO TABS: ORAL_TABLET | ORAL | Status: DC

## 2015-03-19 MED ORDER — PREDNISONE 20 MG PO TABS: ORAL_TABLET | ORAL | Status: DC

## 2015-03-19 NOTE — Telephone Encounter (Signed)
Ms. Roseman notified as instructed by telephone.  Prescription for Z-pak and Prednisone sent to [facility] on [address].

## 2015-03-19 NOTE — Telephone Encounter (Signed)
-----   Message from Jinny Sanders, MD sent at 03/19/2015 11:40 AM EDT ----- Yes I think she probably does need antibotics.   Reviewed last OV .  Call in azithromycin 250 mg 2tabs po x 1 then 1 tab po daily. #6, Valentina.Bachelor. Refill pred taper from last OV. If not improving follow up.

## 2015-03-20 ENCOUNTER — Telehealth: Payer: Self-pay

## 2015-03-20 MED ORDER — AMOXICILLIN-POT CLAVULANATE 875-125 MG PO TABS: 1.0000 | ORAL_TABLET | Freq: Two times a day (BID) | ORAL | Status: DC

## 2015-03-20 NOTE — Telephone Encounter (Signed)
Unable to leave message. Mailbox is full ?

## 2015-03-20 NOTE — Telephone Encounter (Signed)
Mailbox is full. Unable to leave message

## 2015-03-20 NOTE — Telephone Encounter (Signed)
Pt left v/m; pt started zithromax this morning; pt said she has been told before not to take Zithromax because interferes with some of her other meds. Pt wants to know if should continue taking Zithromax or stop that med and get a substitute med. Pt request cb.

## 2015-03-20 NOTE — Telephone Encounter (Signed)
Let pt know. She is correct it can interact with some of her psychotropic meds. Have her stop azithromycin.   I will send in another med for bronchitis... Augmentin looks like only option that does not interact or that she does not have SE to.

## 2015-03-21 NOTE — Telephone Encounter (Signed)
Tritia notified as instructed by telephone.

## 2015-03-26 ENCOUNTER — Other Ambulatory Visit: Payer: Self-pay

## 2015-03-26 MED ORDER — ALBUTEROL SULFATE HFA 108 (90 BASE) MCG/ACT IN AERS
2.0000 | INHALATION_SPRAY | Freq: Four times a day (QID) | RESPIRATORY_TRACT | Status: DC | PRN
Start: 2015-03-26 — End: 2016-02-25

## 2015-03-26 MED ORDER — FLUTICASONE-SALMETEROL 250-50 MCG/DOSE IN AEPB: 1.0000 | INHALATION_SPRAY | Freq: Two times a day (BID) | RESPIRATORY_TRACT | Status: DC

## 2015-03-26 NOTE — Telephone Encounter (Signed)
Pt cannot afford the albuterol inhaler and advair at local pharmacy and request refills sent to [facility](pt already has acct set up). Advised [facility] will [facility] and [facility] Merlin at [facility] and cancelled refills.

## 2015-04-03 ENCOUNTER — Ambulatory Visit (INDEPENDENT_AMBULATORY_CARE_PROVIDER_SITE_OTHER): Payer: 59 | Admitting: Internal Medicine

## 2015-04-03 ENCOUNTER — Encounter: Payer: Self-pay | Admitting: Internal Medicine

## 2015-04-03 VITALS — BP 116/78 | HR 95 | Temp 97.7°F | Wt 115.8 lb

## 2015-04-03 DIAGNOSIS — R059 Cough, unspecified: Secondary | ICD-10-CM

## 2015-04-03 DIAGNOSIS — J449 Chronic obstructive pulmonary disease, unspecified: Secondary | ICD-10-CM | POA: Diagnosis not present

## 2015-04-03 DIAGNOSIS — R05 Cough: Secondary | ICD-10-CM | POA: Diagnosis not present

## 2015-04-03 DIAGNOSIS — D72829 Elevated white blood cell count, unspecified: Secondary | ICD-10-CM

## 2015-04-03 LAB — CBC WITH DIFFERENTIAL/PLATELET
Basophils Absolute: 0 K/uL (ref 0.0–0.1)
Basophils Relative: 0.5 % (ref 0.0–3.0)
Eosinophils Absolute: 0.1 K/uL (ref 0.0–0.7)
Eosinophils Relative: 0.7 % (ref 0.0–5.0)
HCT: 33.4 % — ABNORMAL LOW (ref 36.0–46.0)
Hemoglobin: 11.2 g/dL — ABNORMAL LOW (ref 12.0–15.0)
Lymphocytes Relative: 28.4 % (ref 12.0–46.0)
Lymphs Abs: 2.7 K/uL (ref 0.7–4.0)
MCHC: 33.5 g/dL (ref 30.0–36.0)
MCV: 86.6 fl (ref 78.0–100.0)
Monocytes Absolute: 1.1 K/uL — ABNORMAL HIGH (ref 0.1–1.0)
Monocytes Relative: 11.1 % (ref 3.0–12.0)
Neutro Abs: 5.6 K/uL (ref 1.4–7.7)
Neutrophils Relative %: 59.3 % (ref 43.0–77.0)
Platelets: 559 K/uL — ABNORMAL HIGH (ref 150.0–400.0)
RBC: 3.86 Mil/uL — ABNORMAL LOW (ref 3.87–5.11)
RDW: 16.2 % — ABNORMAL HIGH (ref 11.5–15.5)
WBC: 9.5 K/uL (ref 4.0–10.5)

## 2015-04-03 MED ORDER — HYDROCOD POLST-CPM POLST ER 10-8 MG/5ML PO SUER: 5.0000 mL | Freq: Every evening | ORAL | Status: DC | PRN

## 2015-04-03 NOTE — Progress Notes (Signed)
Subjective:    Patient ID: Robin Welch, female    DOB: 30-Nov-1965, 49 y.o.   MRN: [id number]  HPI  Pt presents to the clinic today to follow up COPD exacerbation. She was seen 01/23/15 for the same- treated with Prednisone, Augmentin and Tussionex. Her symptoms did not resolve completely, so she was seen 02/14/15- she repeated Prednisone at that time and was advised to start taking Advair. Chest xray from 03/08/15 reviewed and appears normal. She saw Dr. Diona Browner 03/18/2015 for an unrelated complaint. Her Davoli count at that time was 15.8. Dr.Bedsole though that she may be having a COPD exacerbation so she put her on Azithromax and repeated her Prednisone again.  She reports that she has a chronic cough which is productive at times of thick brown mucous. Her cough is worse at night. She feels more short of breath than before. She denies fever, chills or body aches. She does continue to smoke but  is down to 1 ppd from 2 ppd. She is taking her Advair daily.    Review of Systems      Past Medical History  Diagnosis Date  . Bipolar 1 disorder     sees Dr. Candis Schatz psych [phone])  . History of tension headache   . History of migraines   . 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 Seizure     from HA medicine?   Marland Kitchen Hx: UTI (urinary tract infection)   . COPD (chronic obstructive pulmonary disease) 03/2013    hyperinflation by CXR  . Smoker     Current Outpatient Prescriptions  Medication Sig Dispense Refill  . albuterol (PROVENTIL HFA;VENTOLIN HFA) 108 (90 BASE) MCG/ACT inhaler Inhale 2 puffs into the lungs every 6 (six) hours as needed for wheezing or shortness of breath. 3 Inhaler 3  . alprazolam (XANAX) 2 MG tablet Take 1 tablet (2 mg total) by mouth 3 (three) times daily as needed for anxiety. 30 tablet 0  . buPROPion (WELLBUTRIN XL) 150 MG 24 hr tablet Take 150 mg by mouth daily.    . chlorpheniramine-HYDROcodone (TUSSIONEX PENNKINETIC ER) 10-8 MG/5ML SUER Take 5 mLs by mouth at  bedtime as needed for cough. 140 mL 0  . Fluticasone-Salmeterol (ADVAIR DISKUS) 250-50 MCG/DOSE AEPB Inhale 1 puff into the lungs 2 (two) times daily. 3 each 3  . lamoTRIgine (LAMICTAL) 200 MG tablet Take 200 mg by mouth daily.    . QUEtiapine (SEROQUEL) 400 MG tablet Take 400 mg by mouth at bedtime.    . ziprasidone (GEODON) 80 MG capsule Take 80 mg by mouth 2 (two) times daily with a meal.     No current facility-administered medications for this visit.    Allergies  Allergen Reactions  . Prozac [Fluoxetine Hcl] Swelling and Rash  . Doxycycline Nausea And Vomiting    Family History  Problem Relation Age of Onset  . Diabetes Mother     T2DM  . Breast cancer Mother 1  . Coronary artery disease Father 6    MI  . Cancer Neg Hx   . Stroke Neg Hx     History   Social History  . Marital Status: Married    Spouse Name: N/A  . Number of Children: 2  . 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 Smoking status: Current Every Day Smoker --  2.00 packs/day    Types: Cigarettes  . Smokeless tobacco: Never Used     Comment: started age 12yo, tried chantix in past  . Alcohol Use: No  . Drug Use: No  . Sexual Activity: Not on file   Other Topics Concern  . Not on file   Social History Narrative   Caffeine: 40 oz diet mountain dew/day   Lives with husband and 2 children, no pets           Constitutional: Denies fever, malaise, fatigue, headache or abrupt weight changes.  HEENT: Denies eye pain, eye redness, ear pain, ringing in the ears, wax buildup, runny nose, nasal congestion, bloody nose, or sore throat. Respiratory: Pt reports cough and shortness of breath. Denies difficulty breathing.   Cardiovascular: Denies chest pain, chest tightness, palpitations or swelling in the hands or feet.   No other specific complaints in a complete review of systems  (except as listed in HPI above).  Objective:   Physical Exam  BP 116/78 mmHg  Pulse 95  Temp(Src) 97.7 F (36.5 C) (Oral)  Wt 115 lb 12.8 oz (52.527 kg)  SpO2 99% Wt Readings from Last 3 Encounters:  04/03/15 115 lb 12.8 oz (52.527 kg)  03/08/15 116 lb (52.617 kg)  02/14/15 122 lb 4 oz (55.452 kg)    General: Appears her stated age, well developed, well nourished in NAD. HEENT: Head: normal shape and size; Eyes: sclera Firman, no icterus, conjunctiva pink, PERRLA and EOMs intact; Ears: Tm's gray and intact, normal light reflex; Throat/Mouth: Teeth present, mucosa pink and moist, no exudate, + PND, no lesions or ulcerations noted. She is notably hoarse. Neck: No adenopathy noted. Cardiovascular: Normal rate and rhythm. S1,S2 noted.  No murmur, rubs or gallops noted. Pulmonary/Chest: Normal effort and diminshed breath sounds. No respiratory distress. No wheezes, rales or ronchi noted.   BMET    Component Value Date/Time   NA 135 12/28/2012 1020   K 4.5 12/28/2012 1020   CL 102 12/28/2012 1020   CO2 28 12/28/2012 1020   GLUCOSE 76 12/28/2012 1020   BUN 12 12/28/2012 1020   CREATININE 0.7 12/28/2012 1020   CALCIUM 8.9 12/28/2012 1020    Lipid Panel     Component Value Date/Time   CHOL 216* 12/28/2012 1020   TRIG 68.0 12/28/2012 1020   HDL 81.50 12/28/2012 1020   CHOLHDL 3 12/28/2012 1020   VLDL 13.6 12/28/2012 1020    CBC    Component Value Date/Time   WBC 15.8* 03/18/2015 0810   RBC 3.72* 03/18/2015 0810   HGB 11.0* 03/18/2015 0810   HCT 32.5* 03/18/2015 0810   PLT 522.0* 03/18/2015 0810   MCV 87.6 03/18/2015 0810   MCH 31.8 12/27/2010 1145   MCHC 33.8 03/18/2015 0810   RDW 16.1* 03/18/2015 0810   LYMPHSABS 1.8 03/18/2015 0810   MONOABS 0.9 03/18/2015 0810   EOSABS 0.2 03/18/2015 0810   BASOSABS 0.1 03/18/2015 0810    Hgb A1C Lab Results  Component Value Date   HGBA1C 5.4 12/28/2012         Assessment & Plan:   Cough:  Discussed smoking  cessation Mucinex BID x 3 days No indication for repeat antibiotics or prednisone Continue Advair and Albuterol RX for Tussionex to take at night  Elevated WBC count:  Repeat CBC with diff today  RTC in 2 weeks for spirometry

## 2015-04-03 NOTE — Progress Notes (Signed)
Pre visit review using our clinic review tool, if applicable. No additional management support is needed unless otherwise documented below in the visit note. 

## 2015-04-03 NOTE — Addendum Note (Signed)
Addended by: Marchia Bond on: 04/03/2015 03:26 PM   Modules accepted: Miquel Dunn

## 2015-04-03 NOTE — Patient Instructions (Signed)
Cough, Adult  A cough is a reflex that helps clear your throat and airways. It can help heal the body or may be a reaction to an irritated airway. A cough may only last 2 or 3 weeks (acute) or may last more than 8 weeks (chronic).  CAUSES Acute cough:  Viral or bacterial infections. Chronic cough:  Infections.  Allergies.  Asthma.  Post-nasal drip.  Smoking.  Heartburn or acid reflux.  Some medicines.  Chronic lung problems (COPD).  Cancer. SYMPTOMS   Cough.  Fever.  Chest pain.  Increased breathing rate.  High-pitched whistling sound when breathing (wheezing).  Colored mucus that you cough up (sputum). TREATMENT   A bacterial cough may be treated with antibiotic medicine.  A viral cough must run its course and will not respond to antibiotics.  Your caregiver may recommend other treatments if you have a chronic cough. HOME CARE INSTRUCTIONS   Only take over-the-counter or prescription medicines for pain, discomfort, or fever as directed by your caregiver. Use cough suppressants only as directed by your caregiver.  Use a cold steam vaporizer or humidifier in your bedroom or home to help loosen secretions.  Sleep in a semi-upright position if your cough is worse at night.  Rest as needed.  Stop smoking if you smoke. SEEK IMMEDIATE MEDICAL CARE IF:   You have pus in your sputum.  Your cough starts to worsen.  You cannot control your cough with suppressants and are losing sleep.  You begin coughing up blood.  You have difficulty breathing.  You develop pain which is getting worse or is uncontrolled with medicine.  You have a fever. MAKE SURE YOU:   Understand these instructions.  Will watch your condition.  Will get help right away if you are not doing well or get worse. Document Released: 02/13/2011 Document Revised: 11/09/2011 Document Reviewed: 02/13/2011 ExitCare Patient Information 2015 [employer]. This information is not intended  to replace advice given to you by your health care provider. Make sure you discuss any questions you have with your health care provider.  

## 2015-04-04 ENCOUNTER — Telehealth: Payer: Self-pay | Admitting: Family Medicine

## 2015-04-04 DIAGNOSIS — R490 Dysphonia: Secondary | ICD-10-CM | POA: Insufficient documentation

## 2015-04-04 NOTE — Telephone Encounter (Signed)
Pt is wanting to know if she should have a scan of her neck.  cb number [phone].

## 2015-04-04 NOTE — Telephone Encounter (Signed)
Ok to do referral to ENT for hoarseness x 3wks. Placed.

## 2015-04-04 NOTE — Telephone Encounter (Signed)
Spoke with patient. She was wondering if she should get a scan for the hoarseness she has had x3 weeks. She also wanted to make you aware that her platelets have been elevated on the last 3 checks.

## 2015-04-15 ENCOUNTER — Ambulatory Visit: Payer: 59 | Admitting: Family Medicine

## 2015-04-23 ENCOUNTER — Ambulatory Visit: Payer: 59 | Admitting: Family Medicine

## 2015-05-03 ENCOUNTER — Ambulatory Visit (INDEPENDENT_AMBULATORY_CARE_PROVIDER_SITE_OTHER): Payer: 59 | Admitting: Psychiatry

## 2015-05-03 ENCOUNTER — Encounter (HOSPITAL_COMMUNITY): Payer: Self-pay | Admitting: Psychiatry

## 2015-05-03 ENCOUNTER — Encounter (INDEPENDENT_AMBULATORY_CARE_PROVIDER_SITE_OTHER): Payer: Self-pay

## 2015-05-03 ENCOUNTER — Telehealth (HOSPITAL_COMMUNITY): Payer: Self-pay

## 2015-05-03 VITALS — BP 102/72 | HR 94 | Ht 61.25 in | Wt 115.8 lb

## 2015-05-03 DIAGNOSIS — F3130 Bipolar disorder, current episode depressed, mild or moderate severity, unspecified: Secondary | ICD-10-CM

## 2015-05-03 MED ORDER — ALPRAZOLAM 2 MG PO TABS: ORAL_TABLET | ORAL | Status: DC

## 2015-05-03 MED ORDER — ZIPRASIDONE HCL 60 MG PO CAPS: ORAL_CAPSULE | ORAL | Status: DC

## 2015-05-03 MED ORDER — LAMOTRIGINE 200 MG PO TABS: 200.0000 mg | ORAL_TABLET | Freq: Every day | ORAL | Status: DC

## 2015-05-03 MED ORDER — BUPROPION HCL ER (XL) 300 MG PO TB24: 300.0000 mg | ORAL_TABLET | Freq: Every day | ORAL | Status: DC

## 2015-05-03 MED ORDER — QUETIAPINE FUMARATE 400 MG PO TABS
400.0000 mg | ORAL_TABLET | Freq: Every day | ORAL | Status: DC
Start: 2015-05-03 — End: 2015-05-03

## 2015-05-03 MED ORDER — QUETIAPINE FUMARATE 400 MG PO TABS: 400.0000 mg | ORAL_TABLET | Freq: Every day | ORAL | Status: DC
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
## 2015-05-03 NOTE — Telephone Encounter (Signed)
Resent patient's e-scribed order to [facility] for patient's Seroquel per Dr Casimiro Needle and faxed in patient's hand written prescription for Xanax per Dr Casimiro Needle to [facility].

## 2015-05-03 NOTE — Telephone Encounter (Signed)
Medication management - Telephone call with patient to inform her prescriptons were e-scribed into [facility] as that was the phone number she provided to this nurse, all except her Xanax order and that was faxed to them today.  Patient agreed to follow up with [facility] in the coming week to make sure they received all order and would be mailing those out.  Patient to call back if any problems.

## 2015-05-03 NOTE — Progress Notes (Signed)
Patient ID: Robin Welch, female   DOB: 04-Aug-1966, 49 y.o.   MRN: [id number] Met with patient and then Dr. Casimiro Needle to verify patient actually gets her prescriptions from [facility] as patient had the phone number and we called to learn her medications come from [facility].  Agreed to cancel other orders per Dr. Casimiro Needle request and resent orders for Geodon, Seroquel, Lamictal and Wellbutrin to [facility] as Dr. Casimiro Needle instructed.  Will call and fax new hand written prescription for patient's prescribed Xanax from Dr. Casimiro Needle today to [facility].  Called [facility] with Rob, pharmacist to cancel out order for Lamictal that was e-scribed to them incorrectly earlier this date per Dr. Casimiro Needle.

## 2015-05-03 NOTE — Telephone Encounter (Signed)
Medication management - Recent patient patient's order for Seroquel to [facility] per Dr. Casimiro Needle as could not be phoned in.

## 2015-05-08 ENCOUNTER — Telehealth: Payer: Self-pay | Admitting: Family Medicine

## 2015-05-08 ENCOUNTER — Encounter: Payer: Self-pay | Admitting: Family Medicine

## 2015-05-08 ENCOUNTER — Ambulatory Visit (INDEPENDENT_AMBULATORY_CARE_PROVIDER_SITE_OTHER): Payer: 59 | Admitting: Family Medicine

## 2015-05-08 VITALS — BP 118/72 | HR 90 | Temp 97.6°F | Ht 61.25 in | Wt 116.0 lb

## 2015-05-08 DIAGNOSIS — G43909 Migraine, unspecified, not intractable, without status migrainosus: Secondary | ICD-10-CM | POA: Insufficient documentation

## 2015-05-08 DIAGNOSIS — G43001 Migraine without aura, not intractable, with status migrainosus: Secondary | ICD-10-CM | POA: Diagnosis not present

## 2015-05-08 MED ORDER — PROMETHAZINE HCL 25 MG PO TABS: 25.0000 mg | ORAL_TABLET | Freq: Three times a day (TID) | ORAL | Status: DC | PRN

## 2015-05-08 MED ORDER — PROMETHAZINE HCL 25 MG/ML IJ SOLN
25.0000 mg | Freq: Once | INTRAMUSCULAR | Status: AC
  Administered 2015-05-08: 25 mg via INTRAMUSCULAR

## 2015-05-08 MED ORDER — KETOROLAC TROMETHAMINE 30 MG/ML IJ SOLN
30.0000 mg | Freq: Once | INTRAMUSCULAR | Status: AC
  Administered 2015-05-08: 30 mg via INTRAMUSCULAR

## 2015-05-08 NOTE — Progress Notes (Signed)
Subjective:    Patient ID: Robin Welch, female    DOB: [birth year], 49 y.o.   MRN: [id number]  HPI Here with a migraine headache   Has not had one in 5 years -used to have them severely   She cannot take "headache medicines" -they give her seizures - has no idea what they were  Has never seen a neurologist   Doctor she used to see gave her percocet -has not needed in 5-7 years   Today-woke up with it  In a lot of pain  Headache is in both temples -both are equal  Sensitive to light  Some n/v  Throbbing  HA is a 9/10 right now  Vomited about 30 minutes ago    She does not get aura   No recent medicine changes   She sees psychiatry as well - Dr Homero Fellers  Stable with that   Is a smoker   No facial droop or speech change or dizziness  Vision gets a bit blurry with her headaches     Patient Active Problem List   Diagnosis Date Noted  . Hoarseness of voice 04/04/2015  . Cold intolerance 03/08/2015  . Mottled skin 03/08/2015  . Abnormal weight loss 03/08/2015  . Chronic cough 03/08/2015  . Candida vaginitis 01/08/2015  . COPD (chronic obstructive pulmonary disease) 09/22/2013  . COPD exacerbation 09/22/2013  . Tobacco abuse 12/18/2010  . Bipolar I disorder, most recent episode (or current) depressed, in partial or unspecified remission 12/18/2010  . Healthcare maintenance 12/18/2010   Past Medical History  Diagnosis Date  . Bipolar 1 disorder     sees Dr. Candis Schatz psych [phone])  . History of tension headache   . History of migraines   . 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 Seizure     from HA medicine?   Marland Kitchen Hx: UTI (urinary tract infection)   . COPD (chronic obstructive pulmonary disease) 03/2013    hyperinflation by CXR  . Smoker    Past Surgical History  Procedure Laterality Date  . Cholecystectomy  2006  . Tubal ligation  2003   Social History  Substance Use Topics  . Smoking status: Current Every Day Smoker -- 2.00 packs/day    Types:  Cigarettes  . Smokeless tobacco: Never Used     Comment: started age 46yo, tried chantix in past  . Alcohol Use: No   Family History  Problem Relation Age of Onset  . Diabetes Mother     T2DM  . Breast cancer Mother 49  . Dementia Mother   . Coronary artery disease Father 51    MI  . Cancer Neg Hx   . Stroke Neg Hx   . Anxiety disorder Paternal Aunt   . Anxiety disorder Cousin   . Depression Cousin   . ADD / ADHD Child   . Anxiety disorder Child   . Depression Child    Allergies  Allergen Reactions  . Prozac [Fluoxetine Hcl] Swelling and Rash  . Azithromycin Other (See Comments)  . Doxycycline Nausea And Vomiting   Current Outpatient Prescriptions on File Prior to Visit  Medication Sig Dispense Refill  . albuterol (PROVENTIL HFA;VENTOLIN HFA) 108 (90 BASE) MCG/ACT inhaler Inhale 2 puffs into the lungs every 6 (six) hours as needed for wheezing or shortness of breath. 3 Inhaler 3  . alprazolam (XANAX) 2 MG tablet 1  Bid 1 prn q day 270 tablet 0  . buPROPion (WELLBUTRIN XL)  300 MG 24 hr tablet Take 1 tablet (300 mg total) by mouth daily. 90 tablet 1  . Fluticasone-Salmeterol (ADVAIR DISKUS) 250-50 MCG/DOSE AEPB Inhale 1 puff into the lungs 2 (two) times daily. 3 each 3  . lamoTRIgine (LAMICTAL) 200 MG tablet Take 1 tablet (200 mg total) by mouth daily. 90 tablet 1  . QUEtiapine (SEROQUEL) 400 MG tablet Take 1 tablet (400 mg total) by mouth at bedtime. 90 tablet 1  . ziprasidone (GEODON) 60 MG capsule 3  qhs at dinner 270 capsule 1   No current facility-administered medications on file prior to visit.    Review of Systems Review of Systems  Constitutional: Negative for fever, appetite change, fatigue and unexpected weight change.  Eyes: Negative for pain and visual disturbance. pos for photophobia with migraine Respiratory: Negative for cough and shortness of breath.   Cardiovascular: Negative for cp or palpitations    Gastrointestinal: Negative for , diarrhea and  constipation. neg for abdominal pain  Genitourinary: Negative for urgency and frequency.  Skin: Negative for pallor or rash   Neurological: Negative for weakness, light-headedness, numbness and pos for  headache Hematological: Negative for adenopathy. Does not bruise/bleed easily.  Psychiatric/Behavioral: Negative for dysphoric mood. The patient is not nervous/anxious.         Objective:   Physical Exam  Constitutional: She is oriented to person, place, and time. She appears well-developed and well-nourished. No distress.  Appears fatigued but well  Dose not act like she is in severe pain  Able to talk/give hx without a problem  HENT:  Head: Normocephalic and atraumatic.  Right Ear: External ear normal.  Left Ear: External ear normal.  Nose: Nose normal.  Mouth/Throat: Oropharynx is clear and moist. No oropharyngeal exudate.  No sinus tenderness No temporal tenderness  No TMJ tenderness  Eyes: Conjunctivae and EOM are normal. Pupils are equal, round, and reactive to light. Right eye exhibits no discharge. Left eye exhibits no discharge. No scleral icterus.  No nystagmus  Neck: Normal range of motion and full passive range of motion without pain. Neck supple. No JVD present. Carotid bruit is not present. No tracheal deviation present. No thyromegaly present.  Cardiovascular: Normal rate, regular rhythm and normal heart sounds.   No murmur heard. Pulmonary/Chest: Effort normal and breath sounds normal. No respiratory distress. She has no wheezes. She has no rales.  Diffusely distant bs  Fair air exch   Abdominal: Soft. Bowel sounds are normal. She exhibits no distension and no mass. There is no tenderness.  Musculoskeletal: She exhibits no edema or tenderness.  Lymphadenopathy:    She has no cervical adenopathy.  Neurological: She is alert and oriented to person, place, and time. She has normal strength and normal reflexes. She displays no atrophy and no tremor. No cranial nerve  deficit or sensory deficit. She exhibits normal muscle tone. She displays a negative Romberg sign. Coordination and gait normal.  No focal cerebellar signs   Skin: Skin is warm and dry. No rash noted. No pallor.  Psychiatric: She has a normal mood and affect. Her behavior is normal. Thought content normal.          Assessment & Plan:   Problem List Items Addressed This Visit      Cardiovascular and Mediastinum   Migraine - Primary    Pt with past hx of migraine that are now infrequent  Reports 9/10 HA intensity today- but exam does not indicate she is that uncomfortable Desired percocet- I explained that  we no longer use narcotics to tx migraine due to incidence of rebound HA - she seemed ok with that  toradol 30 and phenergan 25 IM given  Px for phenergan 25 given for nausea  inst to lie down/hydrate  Will call tom if not improved and seek care in ED if worsened  Reassuring neuro exam      Relevant Medications   ketorolac (TORADOL) 30 MG/ML injection 30 mg (Completed)   promethazine (PHENERGAN) injection 25 mg (Completed)

## 2015-05-08 NOTE — Patient Instructions (Signed)
Toradol injection and phenergan injection today to break the headache cycle Drink lots of fluids as soon as you can keep them down  Here is a px for phenergan to keep hand for phenergan  Go home and lie down and get some rest  Update [place] if no improvement  If worse-go to the emergency room

## 2015-05-08 NOTE — Progress Notes (Signed)
Pre visit review using our clinic review tool, if applicable. No additional management support is needed unless otherwise documented below in the visit note. 

## 2015-05-08 NOTE — Telephone Encounter (Signed)
Noted. Thanks.

## 2015-05-08 NOTE — Telephone Encounter (Signed)
I will see her then  

## 2015-05-08 NOTE — Telephone Encounter (Signed)
Patient Name: Robin Welch  DOB: May 17, 1966    Initial Comment Caller states, migraine headaches, headache Rx gives her seizures, she wants pain Rx instead    Nurse Assessment  Nurse: Mallie Mussel, RN, Alveta Heimlich Date/Time (Eastern Time): 05/08/2015 11:54:30 AM  Confirm and document reason for call. If symptomatic, describe symptoms. ---Caller states that she has a migraine headache that began this morning. She rates her pain as 9 on 0-10 scale. This is not the worst headache of her life. She was prescribed a medication that gave her seizures. She does not remember the name of the medication.  Has the patient traveled out of the country within the last 30 days? ---No  Does the patient require triage? ---Yes  Related visit to physician within the last 2 weeks? ---No  Does the PT have any chronic conditions? (i.e. diabetes, asthma, etc.) ---Yes  List chronic conditions. ---Migraines, Asthma  Did the patient indicate they were pregnant? ---No     Guidelines    Guideline Title Affirmed Question Affirmed Notes  Headache [1] SEVERE headache (e.g., excruciating) AND [2] not improved after 2 hours of pain medicine IBU - did not help   Final Disposition User   See Physician within 4 Hours (or PCP triage) Mallie Mussel, RN, [place] does not want to come to the office, she states that she doesn't feel good. She just wants the doctor to call her in some pain medicine. I called the backline and did a warm transfer to [facility] for further assistance.   Disagree/Comply: Disagree  Disagree/Comply Reason: Disagree with instructions

## 2015-05-08 NOTE — Telephone Encounter (Signed)
Pt spoke with TH but did not want to schedule appt due to not feeling like leaving her home.pt has not had migraine h/a in years but this morning started with migraine h/a (pain level 9) with N&V. Last vomited 15 mins ago. Pt request percocet. Pt said migraine h/a med causes pt to have seizures and pt does not know name of med. Pt request cb.[facility].

## 2015-05-08 NOTE — Telephone Encounter (Signed)
Spoke with patient and advised that she would need to be evaluated since she hasn't had a migraine in years and also wanted percocet. It is Dr. Synthia Innocent half-day, so appt scheduled with Dr. Glori Bickers.

## 2015-05-09 NOTE — Assessment & Plan Note (Signed)
Pt with past hx of migraine that are now infrequent  Reports 9/10 HA intensity today- but exam does not indicate she is that uncomfortable Desired percocet- I explained that we no longer use narcotics to tx migraine due to incidence of rebound HA - she seemed ok with that  toradol 30 and phenergan 25 IM given  Px for phenergan 25 given for nausea  inst to lie down/hydrate  Will call tom if not improved and seek care in ED if worsened  Reassuring neuro exam

## 2015-05-17 ENCOUNTER — Telehealth: Payer: Self-pay

## 2015-05-17 DIAGNOSIS — F3175 Bipolar disorder, in partial remission, most recent episode depressed: Secondary | ICD-10-CM

## 2015-05-17 DIAGNOSIS — IMO0002 Reserved for concepts with insufficient information to code with codable children: Secondary | ICD-10-CM

## 2015-05-17 NOTE — Telephone Encounter (Signed)
Pt left v/m requesting referral to anxiety specialist to assist pt with different ways to control anxiety.spoke with pt and she is already seeing Dr Casimiro Needle who gives pt meds for anxiety but pt pt wants to talk with provider who can offer other ways to control anxiety instead of medication. Please advise.

## 2015-05-18 NOTE — Telephone Encounter (Signed)
Referral placed to psychology at our office for counseling. plz notify we have referred her and to expect a call.

## 2015-05-20 NOTE — Telephone Encounter (Signed)
Patient notified

## 2015-05-27 ENCOUNTER — Emergency Department (HOSPITAL_COMMUNITY)
Admission: EM | Admit: 2015-05-27 | Discharge: 2015-05-27 | Disposition: A | Discharge status: Other Acute Inpatient Hospital | Payer: 59

## 2015-05-29 ENCOUNTER — Telehealth (HOSPITAL_COMMUNITY): Payer: Self-pay

## 2015-05-29 ENCOUNTER — Telehealth: Payer: Self-pay

## 2015-05-29 NOTE — Telephone Encounter (Signed)
Medication request - Telephone call with patient after she had left a message requesting a call back. Patient is requesting Dr. Casimiro Needle consisder giving her Chantix and reported she has also left a message with request for her PCP.

## 2015-05-29 NOTE — Telephone Encounter (Signed)
I also want her to get Dr Dayle Points (psych) ok for chantix prior to prescribing.

## 2015-05-29 NOTE — Telephone Encounter (Signed)
Pt last seen 02/14/15; discussed stop smoking and pt was going to try the patch; pt said she tried the patch and it did not work and pt wanted to try Chantix; pt has previously tried chantix and had problems due to pt drinking alcohol at same time. Pt states she is no longer drinking but advised pt from 02/14/15 notes pt needs spirometry and f/u visit to discuss use of Chantix with Dr Darnell Level. Pt said she would cb to schedule appt. To Dr Darnell Level as Robin Welch.

## 2015-05-30 NOTE — Telephone Encounter (Signed)
Patient notified. She said she no longer sees Dr. Candis Schatz, but now sees someone at [facility]. She goes back there in 1 month and will ask them about it. She may also call them and find out their thoughts and let you know.

## 2015-05-31 NOTE — Telephone Encounter (Signed)
Noted  

## 2015-05-31 NOTE — Telephone Encounter (Signed)
Patient called again questioning if Dr. Casimiro Needle would consider prescribing Chantix for her.

## 2015-05-31 NOTE — Telephone Encounter (Signed)
Pt stated that she sees dr Liliane Bade at [facility] behavioral [facility]  cb number is [phone]

## 2015-06-05 NOTE — Telephone Encounter (Signed)
I will be glad to talk to her about this at our next visit.

## 2015-06-11 ENCOUNTER — Ambulatory Visit: Payer: 59 | Admitting: Psychology

## 2015-06-18 NOTE — Telephone Encounter (Signed)
Pt called and said she wants a counseling appointment.  Pt wants to know how to reduce stress in her life.  Offered to make appointment with Dr. Danise Mina, she declined.  Offered the number [facility], she declined and said she just wanted to talk to someone.  Best number to call pt is [phone]

## 2015-06-19 NOTE — Telephone Encounter (Signed)
Called patient and could not leave message

## 2015-06-19 NOTE — Telephone Encounter (Addendum)
Referral already placed last month

## 2015-07-03 ENCOUNTER — Ambulatory Visit (INDEPENDENT_AMBULATORY_CARE_PROVIDER_SITE_OTHER): Payer: 59 | Admitting: Psychiatry

## 2015-07-03 VITALS — BP 133/76 | HR 97 | Ht 62.0 in | Wt 106.2 lb

## 2015-07-03 DIAGNOSIS — F3130 Bipolar disorder, current episode depressed, mild or moderate severity, unspecified: Secondary | ICD-10-CM

## 2015-07-03 MED ORDER — ZIPRASIDONE HCL 60 MG PO CAPS: ORAL_CAPSULE | ORAL | Status: DC

## 2015-07-03 MED ORDER — QUETIAPINE FUMARATE 400 MG PO TABS: 400.0000 mg | ORAL_TABLET | Freq: Every day | ORAL | Status: DC

## 2015-07-03 MED ORDER — ALPRAZOLAM 2 MG PO TABS: ORAL_TABLET | ORAL | Status: DC

## 2015-07-03 MED ORDER — LAMOTRIGINE 200 MG PO TABS: 200.0000 mg | ORAL_TABLET | Freq: Every day | ORAL | Status: DC

## 2015-07-03 MED ORDER — BUPROPION HCL ER (XL) 150 MG PO TB24: ORAL_TABLET | ORAL | Status: DC

## 2015-07-03 NOTE — Progress Notes (Signed)
[facility] MD Progress Note  07/03/2015 3:39 PM Robin Welch  MRN:  [id number] Subjective:  Doing fairly well. Principal Problem: Bipolar disorder, type I most recent episode depression Diagnosis:  Bipolar disorder, type I most recent episode depressed Today the patient is doing fair. It should be noted that she is now separated from her husband for one month. She claims she's not that distressed over this. She says she sleeps better. She says that he still comes biweekly. She claims he is very controlling and demanding she feels better but could not. The patient is on disability for bipolar disorder. 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 The patient is been psychiatrically hospitalized only one time for what sounds like a manic episode many years ago. Over the last year she's been under the care of Dr. Candis Schatz. After hospitalization he removed her lithium and begin her on Lamictal. Shortly after that he added Geodon and shortly after that he added Seroquel because she was not sleeping. In the past year he added Wellbutrin because she had anhedonia it few other symptoms of clinical depression. She claims that she now is enjoying more things like the television going to ball games and spending time with her mother. The patient lives with her 2 boys one is 36 and one is 25. Both of them have attention deficit disorder and her on Adderall. Factor daughter is also on Adderall. Today the patient shares that shortly after high school she was placed on Adderall and started better in school. Her grades became nearly straight A's in college and she went on to getting to college degrees. She then started teaching on a regular basis. For reasons that are not clear that should the patient came off the Adderall and was at that time we she'll also had her one and only manic episode. The patient is  requested to go back on Adderall at this time. She requested her previous psychiatrist Dr. Candis Schatz but he was resistant because she seems still hyped up. I am hesitant as well. The patient takes her Xanax 2 mg 1 in the morning one midday and very often takes the third pill at the end of the day. She's on 300 mg Wellbutrin. She takes 200 mg of Lamictal 400 mg a Seroquel 180 mg Geodon. She claims a combination of medications are working very well. Stable for a long time. I've asked her when she returns to come back with her 69 year old son Ronalee Belts. The patient says that when she took Adderall she was able to read was much more focused. I believe her. At this time I will attempt to increase her Wellbutrin to the maximum dose and see if it helps her tension disorder. If he does not I will discontinue her Wellbutrin and consider using Adderall. This patient has no history of alcohol or drug use. I will likely attempt to get a drug screen at some point. This patient she'll return in a couple months and will talk more. Patient Active Problem List   Diagnosis Date Noted  . Migraine [G43.909] 05/08/2015  . Hoarseness of voice [R49.0] 04/04/2015  . Cold intolerance [R68.89] 03/08/2015  . Mottled skin [L81.9] 03/08/2015  . Abnormal weight loss [R63.4] 03/08/2015  . Chronic cough [R05] 03/08/2015  . Candida vaginitis [B37.3] 01/08/2015  . COPD (chronic obstructive pulmonary disease) ([place]) [J44.9] 09/22/2013  . COPD exacerbation (  [place]) [J44.1] 09/22/2013  . Tobacco abuse [Z72.0] 12/18/2010  . Bipolar I disorder, most recent episode (or current) depressed, in partial or unspecified remission [F31.75] 12/18/2010  . Healthcare maintenance [Z00.00] 12/18/2010   Total Time spent with patient: 30 minutes  Past Psychiatric History:   Past Medical History:  Past Medical History  Diagnosis Date  . Bipolar 1 disorder     sees Dr. Candis Schatz psych [phone])  . History of tension headache   . History of migraines    . 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 Seizure     from [facility] medicine?   Marland Kitchen Hx: UTI (urinary tract infection)   . COPD (chronic obstructive pulmonary disease) 03/2013    hyperinflation by CXR  . Smoker     Past Surgical History  Procedure Laterality Date  . Cholecystectomy  2006  . Tubal ligation  2003   Family History:  Family History  Problem Relation Age of Onset  . Diabetes Mother     T2DM  . Breast cancer Mother 95  . Dementia Mother   . Coronary artery disease Father 42    MI  . Cancer Neg Hx   . Stroke Neg Hx   . Anxiety disorder Paternal Aunt   . Anxiety disorder Cousin   . Depression Cousin   . ADD / ADHD Child   . Anxiety disorder Child   . Depression Child    Family Psychiatric  History:  Social History:  History  Alcohol Use No     History  Drug Use No    Social History   Social History  . Marital Status: Married    Spouse Name: N/A  . Number of Children: 2  . 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 Smoking status: Current Every Day Smoker -- 2.00 packs/day    Types: Cigarettes  . Smokeless tobacco: Never Used     Comment: started age 54yo, tried chantix in past  . Alcohol Use: No  . Drug Use: No  . Sexual Activity: Yes    Birth Control/ Protection: Surgical   Other Topics Concern  . Not on file   Social History Narrative   Caffeine: 40 oz diet mountain dew/day   Lives with husband and 2 children, no pets         Additional Social History:                         Sleep: Good  Appetite:  Good  Current Medications: Current Outpatient Prescriptions  Medication Sig Dispense Refill  . albuterol (PROVENTIL HFA;VENTOLIN HFA) 108 (90 BASE) MCG/ACT inhaler Inhale 2 puffs into the lungs every 6 (six) hours as needed for wheezing or shortness of breath. 3 Inhaler 3  . alprazolam (XANAX) 2 MG tablet 1   Bid 1 prn q day 270 tablet 0  . buPROPion (WELLBUTRIN XL) 150 MG 24 hr tablet 3  qam 90 tablet 5  . Fluticasone-Salmeterol (ADVAIR DISKUS) 250-50 MCG/DOSE AEPB Inhale 1 puff into the lungs 2 (two) times daily. 3 each 3  . lamoTRIgine (LAMICTAL) 200 MG tablet Take 1 tablet (200 mg total) by mouth daily. 90 tablet 1  . promethazine (PHENERGAN) 25 MG tablet Take 1 tablet (25 mg total) by mouth every 8 (eight) hours as needed for  nausea or vomiting. 10 tablet 0  . QUEtiapine (SEROQUEL) 400 MG tablet Take 1 tablet (400 mg total) by mouth at bedtime. 90 tablet 1  . ziprasidone (GEODON) 60 MG capsule 3  qhs at dinner 270 capsule 1   No current facility-administered medications for this visit.    Lab Results: No results found for this or any previous visit (from the past 48 hour(s)).  Physical Findings: AIMS:  , ,  ,  ,    CIWA:    COWS:     Musculoskeletal: Strength & Muscle Tone: within normal limits Gait & Station: normal Patient leans: Right  Psychiatric Specialty Exam: ROS  Blood pressure 133/76, pulse 97, height 5\' 2"  (1.575 m), weight 106 lb 3.2 oz (48.172 kg), last menstrual period 01/04/2015.Body mass index is 19.42 kg/(m^2).  General Appearance: Disheveled  Eye Contact::  Good  Speech:  Clear and Coherent  Volume:  Normal  Mood:  Euthymic  Affect:  Appropriate  Thought Process:  Coherent  Orientation:  Full (Time, Place, and Person)  Thought Content:  WDL  Suicidal Thoughts:  No  Homicidal Thoughts:  No  Memory:  NA  Judgement:  Good  Insight:  Fair  Psychomotor Activity:  Normal  Concentration:  Good  Recall:  Good  Fund of Knowledge:Good  Language: Good  Akathisia:  No  Handed:  Right  AIMS (if indicated):     Assets:  Desire for Improvement  ADL's:  Intact  Cognition: WNL  Sleep:      Treatment Plan Summary: Today the patient is fairly stable. I'm interested in talking to her adult son Ronalee Belts. Overall her mood is controlled. She takes a high dose of Xanax but  seems to take it appropriately. She takes the Seroquel and Geodon but claims that both of them have slowly started for different reasons and are working very well. The patient takes Lamictal as well. She claims she failed on lithium in the past. I will attempt to review her past chart which is in our system. If in fact her higher dose Wellbutrin at 450 does not work I would consider the use of Adderall. On the other hand she is not working full-time and does indicate her part-time job. Better clarify why the Adderall be relevant and meaningful for her on a day-to-day basis. Today the patient had an aims scale and showed minimal carted dyskinesia. The patient was educated on this risk and the concept of reducing or discontinuing one of her antipsychotic medicines. The patient is resistant. She's very pleased at how well she's been and is scared to make any changes. She's told of the risk of TD could get worse by taking 2 antipsychotic medications but she says it doesn't make a difference. All the medicine she is take her helpful. She denies ever clear episode of major depression that justify the use of Wellbutrin. Positive taking Wellbutrin bipolar disorder further something that should be reevaluated. This patient to return to see me in 2 months. The patient denies chest pain or shortness of breath. She is no neurological symptoms at this time. She's not suicidal.  Darrik Richman, [place] 07/03/2015, 3:39 PM

## 2015-07-03 NOTE — Progress Notes (Signed)
Patient ID: Robin Welch, female   DOB: 1965-09-26, 49 y.o.   MRN: [id number] Met with Dr. Casimiro Needle and patient to discuss patient's request to have all medications sent to [facility].  New 90 day orders of all medications e-scribed to [facility] in [place] but called [facility] to question where Alprazolam order should be mailed.  Agreed to mail new Alprazolam order to [address] [id number], [place], [place] [address].  Agreed to mail this out for patient and placed in outgoing mail.  Also e-scribed in new orders for patient's prescribed Seroquel, Geodon, Lamictal and Wellbutrin for 90 day orders plus one refill on each.  Called [facility] per request of Dr. Casimiro Needle to cancel orders for these same 4 medications e-scribed earlier today as patient wants them to come from mail order.

## 2015-07-23 ENCOUNTER — Telehealth (HOSPITAL_COMMUNITY): Payer: Self-pay

## 2015-07-23 NOTE — Telephone Encounter (Signed)
Pt left v/m; pt request Dr Darnell Level contact her psychiatrist at [phone] to verify it is OK for pt to use chantix to stop smoking.

## 2015-07-23 NOTE — Telephone Encounter (Signed)
Request sent to Dr. Casimiro Needle. Waiting on response.

## 2015-07-23 NOTE — Telephone Encounter (Signed)
Medication management - Telephone call with pt. to inform this nurse did receive a fax from her PCP, Dr. Donata Duff at [facility] questioning if okay for pt. to take Chantix with current medication.  Informed patient Dr. Casimiro Needle was out this week and would send fax with question to Dr. Lovena Le covering for Dr. Casimiro Needle and agreed to contact her back once an answer provided. Patient agreed with plan.  States she tried it one time in the past but had to stop due to drinking and denies any SA use of alcohol at this time.  Pt. reports desire to stop smoking and would like to take Chantix if approved.

## 2015-07-24 NOTE — Telephone Encounter (Signed)
Met with Dr. Lovena Le who verified through pharmacy there are no contraindications for patient to take Chantix with current medication regimen as Dr. Lovena Le is covering for Dr. Casimiro Needle out this week.  Called and spoke with Renie, LPN at [facility] to inform Dr. Donnelly Angelica approved for Dr. Orlie Dakin to prescribe patient Chantix to help with smoke cessation as verified no contraindications with current psychiatric medications.  LPN agreed to inform Dr. Danise Mina and have him call if any further concerns or questions. Called and left patient a message Dr. Lovena Le had approved patient taking Chantix as far as due to safe with psychiatric medication and informed our office had communicated this to Dr. Bosie Clos office at [facility].  Requested patient call back if any further concerns or questions.

## 2015-07-24 NOTE — Telephone Encounter (Signed)
Beather Arbour RN with [facility] health said Dr Casimiro Needle is not available but Dr Donnelly Angelica said OK for pt to take Chantix.

## 2015-07-24 NOTE — Telephone Encounter (Signed)
Pt left v/m requesting cb about chantix rx to [facility].

## 2015-07-26 MED ORDER — VARENICLINE TARTRATE 0.5 MG X 11 & 1 MG X 42 PO MISC: ORAL | Status: DC

## 2015-07-26 MED ORDER — VARENICLINE TARTRATE 1 MG PO TABS: 1.0000 mg | ORAL_TABLET | Freq: Two times a day (BID) | ORAL | Status: DC

## 2015-07-26 NOTE — Addendum Note (Signed)
Addended by: Ria Bush on: 07/26/2015 10:05 AM   Modules accepted: Orders

## 2015-07-26 NOTE — Telephone Encounter (Addendum)
Sent in. Start starting month pack, then continue continuing month pack, may refill x1.  Watch for nausea - if develops let us know and we can change dosing. If any chest pain, stop and immediately let us know.

## 2015-07-29 NOTE — Telephone Encounter (Signed)
Patient notified and verbalized understanding. 

## 2015-08-13 ENCOUNTER — Telehealth (HOSPITAL_COMMUNITY): Payer: Self-pay

## 2015-08-13 NOTE — Telephone Encounter (Signed)
Attempted to reach patient two times after she left 2 messages requesting a call back but unable to leave a voicemail as her mail box reported being full.  Will await to hear from patient to question current need.

## 2015-08-23 ENCOUNTER — Ambulatory Visit (INDEPENDENT_AMBULATORY_CARE_PROVIDER_SITE_OTHER)
Admission: RE | Admit: 2015-08-23 | Discharge: 2015-08-23 | Disposition: A | Discharge status: Home / Self Care | Payer: 59 | Source: Ambulatory Visit | Attending: Family Medicine | Admitting: Family Medicine

## 2015-08-23 ENCOUNTER — Encounter: Payer: Self-pay | Admitting: Family Medicine

## 2015-08-23 ENCOUNTER — Ambulatory Visit: Payer: Self-pay | Admitting: Family Medicine

## 2015-08-23 ENCOUNTER — Ambulatory Visit (INDEPENDENT_AMBULATORY_CARE_PROVIDER_SITE_OTHER): Payer: 59 | Admitting: Family Medicine

## 2015-08-23 ENCOUNTER — Telehealth: Payer: Self-pay | Admitting: Family Medicine

## 2015-08-23 VITALS — BP 108/58 | HR 101 | Temp 97.7°F | Wt 92.0 lb

## 2015-08-23 DIAGNOSIS — R634 Abnormal weight loss: Secondary | ICD-10-CM

## 2015-08-23 DIAGNOSIS — R0789 Other chest pain: Secondary | ICD-10-CM

## 2015-08-23 DIAGNOSIS — R636 Underweight: Secondary | ICD-10-CM | POA: Diagnosis not present

## 2015-08-23 DIAGNOSIS — J449 Chronic obstructive pulmonary disease, unspecified: Secondary | ICD-10-CM

## 2015-08-23 DIAGNOSIS — Z720 Tobacco use: Secondary | ICD-10-CM

## 2015-08-23 DIAGNOSIS — J4489 Other specified chronic obstructive pulmonary disease: Secondary | ICD-10-CM

## 2015-08-23 DIAGNOSIS — J441 Chronic obstructive pulmonary disease with (acute) exacerbation: Secondary | ICD-10-CM

## 2015-08-23 MED ORDER — TIOTROPIUM BROMIDE MONOHYDRATE 18 MCG IN CAPS: 18.0000 ug | ORAL_CAPSULE | Freq: Every day | RESPIRATORY_TRACT | Status: DC

## 2015-08-23 MED ORDER — OXYCODONE-ACETAMINOPHEN 5-325 MG PO TABS: 1.0000 | ORAL_TABLET | Freq: Three times a day (TID) | ORAL | Status: DC | PRN

## 2015-08-23 MED ORDER — NAPROXEN 500 MG PO TABS: ORAL_TABLET | ORAL | Status: DC

## 2015-08-23 NOTE — Telephone Encounter (Signed)
plz notify xray returned showing COPD but no pneumothorax (hole in lung). How is she feeling over weekend? Let me know if persistent dyspnea/cough/congestion for abx course.

## 2015-08-23 NOTE — Patient Instructions (Addendum)
Xray without pneumothorax. Continue advair 1 puff twice daily. Add spiriva nightly (second breathing medicine). For costochondritis with chest wall pain - stop tylenol/ibuprofen. Start naprosyn 500mg  twice daily with food and may use percocets for break through pain up to twice a day. Please work on stop smoking May use heating pad to chest wall as well.

## 2015-08-23 NOTE — Assessment & Plan Note (Addendum)
Pt has never returned for spirometry. Will add on spiriva to her advair. Discussed my concerns with her deteriorating health evidenced by recurrent COPD exacerbations and weight loss and chronic dyspnea.  CXR with hyperinflation. Anticipate pt has combination of chronic bronchitis and emphysema. Consider checking alpha 1 antitrypsin given young age.

## 2015-08-23 NOTE — Assessment & Plan Note (Signed)
Marked weight loss, reviewed with patient. Pt attributes to no appetite from stress currently undergoing separation since 06/2015

## 2015-08-23 NOTE — Progress Notes (Signed)
BP 108/58 mmHg  Pulse 101  Temp(Src) 97.7 F (36.5 C) (Oral)  Wt 92 lb (41.731 kg)  SpO2 98%  LMP 01/04/2015   CC: muscle pain  Subjective:    Patient ID: Robin Welch, female    DOB: 11/14/1965, 49 y.o.   MRN: [id number]  HPI: Robin Welch is a 49 y.o. female presenting on 08/23/2015 for Muscle Pain   Ongoing head and chest congestion/cough over last 3 days associated with left side chest wall pain described as throbbing pain, worse with deep breath, cough, sneeze, or even talking. Stays dyspneic and wheezy. + increased cough, mucous production. She has been taking tylenol and ibuprofen   No pressure/tightness, no nausea or abd pain. No tearing pain. Not painful to touch.   Never returned for spirometry. Presumed COPD from smoking. She is regular with her advair 250/50 1 puff BID.  Denies inciting trauma or injury.   Smoking - continues 2 ppd. Chantix stopped because pharmacist daughter didn't think she was doing well with this.  Relevant past medical, surgical, family and social history reviewed and updated as indicated. Interim medical history since our last visit reviewed. Allergies and medications reviewed and updated. Current Outpatient Prescriptions on File Prior to Visit  Medication Sig  . albuterol (PROVENTIL HFA;VENTOLIN HFA) 108 (90 BASE) MCG/ACT inhaler Inhale 2 puffs into the lungs every 6 (six) hours as needed for wheezing or shortness of breath.  . alprazolam (XANAX) 2 MG tablet 1  Bid 1 prn q day  . buPROPion (WELLBUTRIN XL) 150 MG 24 hr tablet Take 3 tablets every morning.  . Fluticasone-Salmeterol (ADVAIR DISKUS) 250-50 MCG/DOSE AEPB Inhale 1 puff into the lungs 2 (two) times daily.  Marland Kitchen lamoTRIgine (LAMICTAL) 200 MG tablet Take 1 tablet (200 mg total) by mouth daily.  . promethazine (PHENERGAN) 25 MG tablet Take 1 tablet (25 mg total) by mouth every 8 (eight) hours as needed for nausea or vomiting. (Patient not taking: Reported on 08/23/2015)  . QUEtiapine  (SEROQUEL) 400 MG tablet Take 1 tablet (400 mg total) by mouth at bedtime.  . varenicline (CHANTIX CONTINUING MONTH PAK) 1 MG tablet Take 1 tablet (1 mg total) by mouth 2 (two) times daily. (Patient not taking: Reported on 08/23/2015)  . varenicline (CHANTIX PAK) 0.5 MG X 11 & 1 MG X 42 tablet Use as directed (Patient not taking: Reported on 08/23/2015)  . ziprasidone (GEODON) 60 MG capsule Take 3  capsules each night at dinner   No current facility-administered medications on file prior to visit.    Review of Systems Per HPI unless specifically indicated in ROS section     Objective:    BP 108/58 mmHg  Pulse 101  Temp(Src) 97.7 F (36.5 C) (Oral)  Wt 92 lb (41.731 kg)  SpO2 98%  LMP 01/04/2015  Wt Readings from Last 3 Encounters:  08/23/15 92 lb (41.731 kg)  07/03/15 106 lb 3.2 oz (48.172 kg)  05/08/15 116 lb (52.617 kg)    Physical Exam  Constitutional: She appears well-developed and well-nourished. No distress.  HENT:  Head: Normocephalic and atraumatic.  Right Ear: Hearing, tympanic membrane, external ear and ear canal normal.  Left Ear: Hearing, tympanic membrane, external ear and ear canal normal.  Nose: No mucosal edema or rhinorrhea. Right sinus exhibits no maxillary sinus tenderness and no frontal sinus tenderness. Left sinus exhibits no maxillary sinus tenderness and no frontal sinus tenderness.  Mouth/Throat: Uvula is midline and mucous membranes are normal. Posterior oropharyngeal erythema present. No oropharyngeal  exudate, posterior oropharyngeal edema or tonsillar abscesses.  Eyes: Conjunctivae and EOM are normal. Pupils are equal, round, and reactive to light. No scleral icterus.  Neck: Normal range of motion. Neck supple.  Cardiovascular: Normal rate, regular rhythm, normal heart sounds and intact distal pulses.   No murmur heard. Pulmonary/Chest: Accessory muscle usage present. No respiratory distress. She has decreased breath sounds. She has wheezes (mild). She  has no rhonchi. She has no rales. She exhibits tenderness and swelling (mild at L 2nd costochondral junction). She exhibits no bony tenderness and no crepitus.    Point tender to palpation L 2nd costochondral junction Pleurisy present Mild dyspnea, at her baseline  Lymphadenopathy:    She has no cervical adenopathy.  Skin: Skin is warm and dry. No rash noted.  Nursing note and vitals reviewed.  CHEST 2 VIEW COMPARISON: March 08, 2015  FINDINGS: The heart size and mediastinal contours are within normal limits. Both lungs are clear. The visualized skeletal structures are stable. Prior cholecystectomy clips are identified.  IMPRESSION: No active cardiopulmonary disease.  Electronically Signed  By: Abelardo Diesel M.D.  On: 08/23/2015 16:54    Assessment & Plan:   Problem List Items Addressed This Visit    Tobacco abuse    Chronic, continued smoker. Daughter asked her to stop chantix. H/o bipolar. I discussed my concern with her deteriorating health and recurring lung infections. Continue to encourage cessation. Contemplative.      Left-sided chest wall pain - Primary    Exam of reproducible chest wall tenderness at 2nd costochondral junction consistent with costochondritis. Pt states tylenol/NSAIDs OTC not effective. Will treat with naprosyn 500mg  BID with food and use percocets for breakthrough pain. Discussed in detail to seek ER care if worsening pain or worsening dyspnea. Distant breath sounds throughout - check CXR to r/o PTX.       Relevant Orders   DG Chest 2 View (Completed)   COPD exacerbation ([place])    Anticipate mild exacerbation but without bacterial infection.  Will continue advair and add spiriva. Low threshold to add prednisone.      Relevant Medications   tiotropium (SPIRIVA HANDIHALER) 18 MCG inhalation capsule   COPD (chronic obstructive pulmonary disease) with chronic bronchitis (HCC)    Pt has never returned for spirometry. Will add on spiriva to  her advair. Discussed my concerns with her deteriorating health evidenced by recurrent COPD exacerbations and weight loss and chronic dyspnea.  CXR with hyperinflation. Anticipate pt has combination of chronic bronchitis and emphysema. Consider checking alpha 1 antitrypsin given young age.      Relevant Medications   tiotropium (SPIRIVA HANDIHALER) 18 MCG inhalation capsule   Abnormal weight loss    Marked weight loss, reviewed with patient. Pt attributes to no appetite from stress currently undergoing separation since 06/2015       Other Visit Diagnoses    Low weight            Follow up plan: Return if symptoms worsen or fail to improve.

## 2015-08-23 NOTE — Assessment & Plan Note (Signed)
Anticipate mild exacerbation but without bacterial infection.  Will continue advair and add spiriva. Low threshold to add prednisone.

## 2015-08-23 NOTE — Assessment & Plan Note (Addendum)
Exam of reproducible chest wall tenderness at 2nd costochondral junction consistent with costochondritis. Pt states tylenol/NSAIDs OTC not effective. Will treat with naprosyn 500mg  BID with food and use percocets for breakthrough pain. Discussed in detail to seek ER care if worsening pain or worsening dyspnea. Distant breath sounds throughout - check CXR to r/o PTX.

## 2015-08-23 NOTE — Assessment & Plan Note (Signed)
Chronic, continued smoker. Daughter asked her to stop chantix. H/o bipolar. I discussed my concern with her deteriorating health and recurring lung infections. Continue to encourage cessation. Contemplative.

## 2015-08-28 NOTE — Telephone Encounter (Signed)
Patient notified and said she was a little congested and was coughing some over the weekend, but is much better now.

## 2015-09-04 ENCOUNTER — Ambulatory Visit (INDEPENDENT_AMBULATORY_CARE_PROVIDER_SITE_OTHER): Payer: 59 | Admitting: Psychiatry

## 2015-09-04 VITALS — BP 100/60 | HR 107 | Ht 62.0 in | Wt 90.0 lb

## 2015-09-04 DIAGNOSIS — F3110 Bipolar disorder, current episode manic without psychotic features, unspecified: Secondary | ICD-10-CM

## 2015-09-04 MED ORDER — MIRTAZAPINE 30 MG PO TABS: 30.0000 mg | ORAL_TABLET | Freq: Every day | ORAL | Status: DC

## 2015-09-04 MED ORDER — CLONAZEPAM 1 MG PO TABS: 1.0000 mg | ORAL_TABLET | Freq: Two times a day (BID) | ORAL | Status: DC

## 2015-09-04 NOTE — Progress Notes (Signed)
[facility] MD Progress Note  09/04/2015 4:55 PM Robin Welch  MRN:  [id number] Subjective:  Agitated Principal Problem: Bipolar disorder1 most recent episode depressed Diagnosis:  Bipolar disorder,1 most recent episode depressed Today the patient is extremely hyperactive. She cannot sit still. She shares that she's lost 40 pounds in the last few months and doesn't know why. Through the interview it becomes evident of why she's lost weight. I asked her to get a drug screen and she immediately admitted that she was using cocaine. She denies going through withdrawal at this time. She doesn't have a tremor but she can't sit still. Patient says she's having blackouts at night. Patient says she's lost her appetite. Interestingly she sleeping well. Interestingly she denies persistent depression. The patient claims the biggest stress is her impending divorce. It is noted that her son Ronalee Belts is not here with her and she says he will call him. We briefly consider the idea of her going back on Adderall but that just would not happen. The patient went back and forth about coming into the hospital. But the patient denies being suicidal or homicidal. She denies auditory visual hallucinations. He denies really going through withdrawal. The patient is not sure why she wanted coming to the hospital. The patient says she cannot afford therapy. Today I recommended that she begin in our chemical dependency outpatient program and she agreed. Patient Active Problem List   Diagnosis Date Noted  . Left-sided chest wall pain [R07.89] 08/23/2015  . Migraine [G43.909] 05/08/2015  . Hoarseness of voice [R49.0] 04/04/2015  . Cold intolerance [R68.89] 03/08/2015  . Mottled skin [L81.9] 03/08/2015  . Abnormal weight loss [R63.4] 03/08/2015  . Candida vaginitis [B37.3] 01/08/2015  . COPD (chronic obstructive pulmonary disease) with chronic bronchitis ([place]) [J44.9] 09/22/2013  . COPD exacerbation ([place]) [J44.1] 09/22/2013  . Tobacco abuse  [Z72.0] 12/18/2010  . Bipolar I disorder, most recent episode (or current) depressed, in partial or unspecified remission [F31.75] 12/18/2010  . Healthcare maintenance [Z00.00] 12/18/2010   Total Time spent with patient: 30 minutes  Past Psychiatric History:   Past Medical History:  Past Medical History  Diagnosis Date  . Bipolar 1 disorder [facility])     sees Dr. Candis Schatz psych [phone])  . History of tension headache   . History of migraines   . 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 Seizure ([place])     from HA medicine?   Marland Kitchen Hx: UTI (urinary tract infection)   . COPD (chronic obstructive pulmonary disease) ([place]) 03/2013    hyperinflation by CXR  . Smoker     Past Surgical History  Procedure Laterality Date  . Cholecystectomy  2006  . Tubal ligation  2003   Family History:  Family History  Problem Relation Age of Onset  . Diabetes Mother     T2DM  . Breast cancer Mother 72  . Dementia Mother   . Coronary artery disease Father 8    MI  . Cancer Neg Hx   . Stroke Neg Hx   . Anxiety disorder Paternal Aunt   . Anxiety disorder Cousin   . Depression Cousin   . ADD / ADHD Child   . Anxiety disorder Child   . Depression Child    Family Psychiatric  History:  Social History:  History  Alcohol Use No     History  Drug Use No    Social History   Social History  . Marital Status: Married  Spouse Name: N/A  . Number of Children: 2  . 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 Smoking status: Current Every Day Smoker -- 2.00 packs/day    Types: Cigarettes  . Smokeless tobacco: Never Used     Comment: started age 1yo, tried chantix in past  . Alcohol Use: No  . Drug Use: No  . Sexual Activity: Yes    Birth Control/ Protection: Surgical   Other Topics Concern  . Not on file   Social History Narrative   Caffeine: 40 oz diet mountain  dew/day   Lives with husband and 2 children, no pets         Additional Social History:                         Sleep: Good  Appetite:  Poor  Current Medications: Current Outpatient Prescriptions  Medication Sig Dispense Refill  . albuterol (PROVENTIL HFA;VENTOLIN HFA) 108 (90 BASE) MCG/ACT inhaler Inhale 2 puffs into the lungs every 6 (six) hours as needed for wheezing or shortness of breath. 3 Inhaler 3  . alprazolam (XANAX) 2 MG tablet 1  Bid 1 prn q day 270 tablet 0  . buPROPion (WELLBUTRIN XL) 150 MG 24 hr tablet Take 3 tablets every morning. 270 tablet 1  . clonazePAM (KLONOPIN) 1 MG tablet Take 1 tablet (1 mg total) by mouth 2 (two) times daily. 1  tid 90 tablet 0  . Fluticasone-Salmeterol (ADVAIR DISKUS) 250-50 MCG/DOSE AEPB Inhale 1 puff into the lungs 2 (two) times daily. 3 each 3  . lamoTRIgine (LAMICTAL) 200 MG tablet Take 1 tablet (200 mg total) by mouth daily. 90 tablet 1  . mirtazapine (REMERON) 30 MG tablet Take 1 tablet (30 mg total) by mouth at bedtime. 30 tablet 3  . naproxen (NAPROSYN) 500 MG tablet Take one po bid x 1 week then prn pain, take with food 40 tablet 0  . oxyCODONE-acetaminophen (ROXICET) 5-325 MG tablet Take 1 tablet by mouth every 8 (eight) hours as needed for severe pain. 20 tablet 0  . promethazine (PHENERGAN) 25 MG tablet Take 1 tablet (25 mg total) by mouth every 8 (eight) hours as needed for nausea or vomiting. (Patient not taking: Reported on 08/23/2015) 10 tablet 0  . QUEtiapine (SEROQUEL) 400 MG tablet Take 1 tablet (400 mg total) by mouth at bedtime. 90 tablet 1  . tiotropium (SPIRIVA HANDIHALER) 18 MCG inhalation capsule Place 1 capsule (18 mcg total) into inhaler and inhale at bedtime. 30 capsule 12  . varenicline (CHANTIX CONTINUING MONTH PAK) 1 MG tablet Take 1 tablet (1 mg total) by mouth 2 (two) times daily. (Patient not taking: Reported on 08/23/2015) 60 tablet 1  . varenicline (CHANTIX PAK) 0.5 MG X 11 & 1 MG X 42 tablet Use as  directed (Patient not taking: Reported on 08/23/2015) 53 tablet 0  . ziprasidone (GEODON) 60 MG capsule Take 3  capsules each night at dinner 270 capsule 1   No current facility-administered medications for this visit.    Lab Results: No results found for this or any previous visit (from the past 48 hour(s)).  Physical Findings: AIMS:  , ,  ,  ,    CIWA:    COWS:     Musculoskeletal: Strength & Muscle Tone: within normal limits Gait & Station: normal Patient  leans: Right  Psychiatric Specialty Exam: ROS  Blood pressure 100/60, pulse 107, height 5\' 2"  (1.575 m), weight 90 lb (40.824 kg), last menstrual period 01/04/2015.Body mass index is 16.46 kg/(m^2).  General Appearance: Fairly Groomed  Engineer, water::  Fair  Speech:  Pressured  Volume:  Normal  Mood:  Anxious  Affect:  Appropriate  Thought Process:  Coherent  Orientation:  Full (Time, Place, and Person)  Thought Content:  WDL  Suicidal Thoughts:  No  Homicidal Thoughts:  No  Memory:  NA  Judgement:  Impaired  Insight:  Lacking  Psychomotor Activity:  Increased  Concentration:  Fair  Recall:  [employer] of Knowledge:Fair  Language: Good  Akathisia:  No  Handed:  Right  AIMS (if indicated):     Assets:  Desire for Improvement  ADL's:  Intact  Cognition: WNL  Sleep:      Treatment Plan Summary: At this time this patient is very hyperactive and restless. She claims she's very distressed and probably is quite anxious. I am unclear if she's going through any withdrawal from her cocaine. I did not get a drug screen but she admits to being using cocaine although claims she doesn't use it regularly. At this time I interventions are to continue the Seroquel and Geodon that she's always been taking as she says that does help her. We will discontinue her Wellbutrin and shears self had stopped her Lamictal. We will go ahead and begin her on Remeron 30 mg daily at bedtime. The patient says she has one month left of Xanax 2 mg 3  times a day. My efforts will obviously be to move her off all benzodiazepines. The possibility of increasing her Seroquel should be considered but for now the patient is agreed to start into chemical dependency IOP program. I'm not clear that she will follow into it. What is clear if she was given a return appointment to see me in 4 weeks. He'll be at that time when her Xanax will be done and at that time we'll begin her on a titrating tapering dose of Klonopin. The possibility of adding more Seroquel will be considered also. At that time she'll have been taking Remeron 30 mg which hopefully will help her mood and increase her appetite. This patient is not suicidal. She is not homicidal. She denies hearing voices or seeing visions. The patient is clearly not well. She agreed to see me back here in 4 weeks and was told to call if there are problems.  Sanjana Folz, [place] 09/04/2015, 4:55 PM

## 2015-09-10 ENCOUNTER — Ambulatory Visit: Payer: 59 | Admitting: Family Medicine

## 2015-09-10 ENCOUNTER — Telehealth: Payer: Self-pay | Admitting: Family Medicine

## 2015-09-10 NOTE — Telephone Encounter (Signed)
plz call pt for follow up. Would offer reschedule appt.

## 2015-09-10 NOTE — Telephone Encounter (Signed)
Pt did not come in for their appt today for an acute visit. Please let me know if pt needs to be contacted immediately for follow up or no follow up needed. Best phone number to contact pt is [phone].

## 2015-09-11 NOTE — Telephone Encounter (Signed)
Attempted to contact patient.  No answer.  Voice mail box is full - unable to leave a message.  Will continue attempts to contact patient.

## 2015-09-13 NOTE — Telephone Encounter (Signed)
Appointment rescheduled for 09/19/15 at 1200.

## 2015-09-16 ENCOUNTER — Telehealth (HOSPITAL_COMMUNITY): Payer: Self-pay

## 2015-09-16 NOTE — Telephone Encounter (Signed)
Telephone call with patient to follow up on message she left she was in need of a new Klonopin order.  Patient reported she she saw Dr. Casimiro Needle on 09/04/15 their plan was for her to finish up Xanax over this month and then to begin Klonopin.  Patient reported she was running out of Xanax and would be out on 09/17/15 and requests a first prescription for Klonopin as patient states she did not get a printed prescription on 09/04/15.  Patient reported she needed a Klonopin order by 09/17/15 as she reports she will then be running out of Xanax and has not gotten an order to fill Klonopin yet.  Informed Dr. Casimiro Needle would not be in the office again until 09/18/15 so agreed to request new order from Dr. Lovena Le who will be helping to cover Dr. Karen Chafe patient's on 09/17/15.  Agreed to call patient back once discussed and patient agreed with plan.

## 2015-09-17 MED ORDER — CLONAZEPAM 1 MG PO TABS: 1.0000 mg | ORAL_TABLET | Freq: Two times a day (BID) | ORAL | Status: DC

## 2015-09-17 NOTE — Telephone Encounter (Signed)
Robin Welch says she did not have enough alprazolam to last until the next appointment.  She said that at the last session she told Dr Casimiro Needle that she did so he did not actually write or call in the clonazepam prescription.  He is not here today so I wrote her enough to get her to the next appointment.  Prescription for clonazepam 1 mg tid # 66 with no additional refills.  Called in to [facility] [address] # [phone].

## 2015-09-19 ENCOUNTER — Ambulatory Visit: Payer: Self-pay | Admitting: Family Medicine

## 2015-09-22 ENCOUNTER — Emergency Department (HOSPITAL_COMMUNITY)
Admission: EM | Admit: 2015-09-22 | Discharge: 2015-09-22 | Disposition: A | Discharge status: Home / Self Care | Payer: 59 | Attending: Physician Assistant | Admitting: Physician Assistant

## 2015-09-22 ENCOUNTER — Encounter (HOSPITAL_COMMUNITY): Payer: Self-pay | Admitting: *Deleted

## 2015-09-22 DIAGNOSIS — F17210 Nicotine dependence, cigarettes, uncomplicated: Secondary | ICD-10-CM | POA: Diagnosis not present

## 2015-09-22 DIAGNOSIS — G43909 Migraine, unspecified, not intractable, without status migrainosus: Secondary | ICD-10-CM | POA: Insufficient documentation

## 2015-09-22 DIAGNOSIS — Z79899 Other long term (current) drug therapy: Secondary | ICD-10-CM | POA: Diagnosis not present

## 2015-09-22 DIAGNOSIS — Z7951 Long term (current) use of inhaled steroids: Secondary | ICD-10-CM | POA: Insufficient documentation

## 2015-09-22 DIAGNOSIS — Z0000 Encounter for general adult medical examination without abnormal findings: Secondary | ICD-10-CM | POA: Diagnosis not present

## 2015-09-22 DIAGNOSIS — F1410 Cocaine abuse, uncomplicated: Secondary | ICD-10-CM | POA: Diagnosis not present

## 2015-09-22 DIAGNOSIS — J449 Chronic obstructive pulmonary disease, unspecified: Secondary | ICD-10-CM | POA: Insufficient documentation

## 2015-09-22 DIAGNOSIS — Z139 Encounter for screening, unspecified: Secondary | ICD-10-CM

## 2015-09-22 DIAGNOSIS — F319 Bipolar disorder, unspecified: Secondary | ICD-10-CM | POA: Diagnosis not present

## 2015-09-22 DIAGNOSIS — Z87440 Personal history of urinary (tract) infections: Secondary | ICD-10-CM | POA: Insufficient documentation

## 2015-09-22 HISTORY — DX: Cocaine dependence, in remission: F14.21

## 2015-09-22 NOTE — ED Notes (Signed)
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  States last use was Wednesday.  Pt and family made aware that the ED does not do detox from cocaine.  But will provide her a list of resources that she can contact.  Pt's family reports that a rehab facility in [place], [place] has a bed ready for her which she's has been in the past.

## 2015-09-22 NOTE — Discharge Instructions (Signed)
Stimulant Use Disorder-Cocaine Cocaine is one of a group of powerful drugs called stimulants. Cocaine has medical uses for stopping nosebleeds and for pain control before minor nose or dental surgery. However, cocaine is misused because of the effects that it produces. These effects include:   A feeling of extreme pleasure.  Alertness.  High energy. Common street names for cocaine include coke, crack, blow, snow, and nose candy. Cocaine is snorted, dissolved in water and injected, or smoked.  Stimulants are addictive because they activate regions of the brain that produce both the pleasurable sensation of "reward" and psychological dependence. Together, these actions account for loss of control and the rapid development of drug dependence. This means you become ill without the drug (withdrawal) and need to keep using it to function.  Stimulant use disorder is use of stimulants that disrupts your daily life. It disrupts relationships with family and friends and how you do your job. Cocaine increases your blood pressure and heart rate. It can cause a heart attack or stroke. Cocaine can also cause death from irregular heart rate or seizures. SYMPTOMS Symptoms of stimulant use disorder with cocaine include:  Use of cocaine in larger amounts or over a longer period of time than intended.  Unsuccessful attempts to cut down or control cocaine use.  A lot of time spent obtaining, using, or recovering from the effects of cocaine.  A strong desire or urge to use cocaine (craving).  Continued use of cocaine in spite of major problems at work, school, or home because of use.  Continued use of cocaine in spite of relationship problems because of use.  Giving up or cutting down on important life activities because of cocaine use.  Use of cocaine over and over in situations when it is physically hazardous, such as driving a car.  Continued use of cocaine in spite of a physical problem that is likely  related to use. Physical problems can include:  Malnutrition.  Nosebleeds.  Chest pain.  High blood pressure.  A hole that develops between the part of your nose that separates your nostrils (perforated nasal septum).  Lung and kidney damage.  Continued use of cocaine in spite of a mental problem that is likely related to use. Mental problems can include:  Schizophrenia-like symptoms.  Depression.  Bipolar mood swings.  Anxiety.  Sleep problems.  Need to use more and more cocaine to get the same effect, or lessened effect over time with use of the same amount of cocaine (tolerance).  Having withdrawal symptoms when cocaine use is stopped, or using cocaine to reduce or avoid withdrawal symptoms. Withdrawal symptoms include:  Depressed or irritable mood.  Low energy or restlessness.  Bad dreams.  Poor or excessive sleep.  Increased appetite. DIAGNOSIS Stimulant use disorder is diagnosed by your health care provider. You may be asked questions about your cocaine use and how it affects your life. A physical exam may be done. A drug screen may be ordered. You may be referred to a mental health professional. The diagnosis of stimulant use disorder requires at least two symptoms within 12 months. The type of stimulant use disorder depends on the number of signs and symptoms you have. The type may be:  Mild. Two or three signs and symptoms.  Moderate. Four or five signs and symptoms.  Severe. Six or more signs and symptoms. TREATMENT Treatment for stimulant use disorder is usually provided by mental health professionals with training in substance use disorders. The following options are available:  Moderate. Four or five signs and symptoms.  · Severe. Six or more signs and symptoms.  TREATMENT  Treatment for stimulant use disorder is usually provided by mental health professionals with training in substance use disorders. The following options are available:  · Counseling or talk therapy. Talk therapy addresses the reasons you use cocaine and ways to keep you from using again. Goals of talk therapy include:  ¨ Identifying and avoiding triggers for use.  ¨ Handling cravings.  ¨ Replacing use with healthy activities.  · Support groups.  Support groups provide emotional support, advice, and guidance.  · Medicine. Certain medicines may decrease cocaine cravings or withdrawal symptoms.  HOME CARE INSTRUCTIONS  · Take medicines only as directed by your health care provider.  · Identify the people and activities that trigger your cocaine use and avoid them.  · Keep all follow-up visits as directed by your health care provider.  SEEK MEDICAL CARE IF:  · Your symptoms get worse or you relapse.  · You are not able to take medicines as directed.  SEEK IMMEDIATE MEDICAL CARE IF:  · You have serious thoughts about hurting yourself or others.  · You have a seizure, chest pain, sudden weakness, or loss of speech or vision.  FOR MORE INFORMATION  · National Institute on Drug Abuse: www.drugabuse.gov  · Substance Abuse and Mental Health Services Administration: www.samhsa.gov     This information is not intended to replace advice given to you by your health care provider. Make sure you discuss any questions you have with your health care provider.     Document Released: 08/14/2000 Document Revised: 09/07/2014 Document Reviewed: 08/30/2013  Elsevier Interactive Patient Education ©2016 Elsevier Inc.

## 2015-09-22 NOTE — ED Provider Notes (Signed)
CSN: [id number]     Arrival date & time 09/22/15  1411 History   First MD Initiated Contact with Patient 09/22/15 1521     Chief Complaint  Patient presents with  . Drug Problem     (Consider location/radiation/quality/duration/timing/severity/associated sxs/prior Treatment) Patient is a 50 y.o. female presenting with drug problem. The history is provided by the patient and medical records.  Drug Problem   50 y.o. F with hx of bipolar disorder, migraine headaches, hx of seizures, COPD, presenting to the ED for detox.  Patient states she is desiring detox from cocaine.  She states she abused this drug in the past and relapsed about 6 months ago.  She has been using a nearly daily basis since this time.  She denies any other illicit drug use.  She denies alcohol abuse.  Denies SI/HI/AVH. Patient and family have contacted facility in [place], [place] that she has been to in the past for detox, they have a bed available today.  Patient denies any physical complaints-- no chest pain, SOB, palpitations, dizziness, weakness, abdominal pain, nausea, vomiting, diarrhea, fever, cough, or URI symptoms.  VSS.  Past Medical History  Diagnosis Date  . Bipolar 1 disorder [facility])     sees Dr. Candis Schatz psych [phone])  . History of tension headache   . History of migraines   . 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 Seizure ([place])     from HA medicine?   Marland Kitchen Hx: UTI (urinary tract infection)   . COPD (chronic obstructive pulmonary disease) ([place]) 03/2013    hyperinflation by CXR  . Smoker    Past Surgical History  Procedure Laterality Date  . Cholecystectomy  2006  . Tubal ligation  2003   Family History  Problem Relation Age of Onset  . Diabetes Mother     T2DM  . Breast cancer Mother 23  . Dementia Mother   . Coronary artery disease Father 40    MI  . Cancer Neg Hx   . Stroke Neg Hx   . Anxiety disorder Paternal Aunt   . Anxiety disorder Cousin   . Depression Cousin   . ADD / ADHD Child    . Anxiety disorder Child   . Depression Child    Social History  Substance Use Topics  . Smoking status: Current Every Day Smoker -- 2.00 packs/day    Types: Cigarettes  . Smokeless tobacco: Never Used     Comment: started age 50yo, tried chantix in past  . Alcohol Use: No   OB History    No data available     Review of Systems  Psychiatric/Behavioral:       Detox  All other systems reviewed and are negative.     Allergies  Prozac; Azithromycin; and Doxycycline  Home Medications   Prior to Admission medications   Medication Sig Start Date End Date Taking? Authorizing Provider  albuterol (PROVENTIL HFA;VENTOLIN HFA) 108 (90 BASE) MCG/ACT inhaler Inhale 2 puffs into the lungs every 6 (six) hours as needed for wheezing or shortness of breath. 03/26/15  Yes Ria Bush, MD  clonazePAM (KLONOPIN) 1 MG tablet Take 1 tablet (1 mg total) by mouth 2 (two) times daily. 1  tid Patient taking differently: Take 1 mg by mouth 3 (three) times daily.  09/17/15  Yes Clarene Reamer, MD  Fluticasone-Salmeterol (ADVAIR DISKUS) 250-50 MCG/DOSE AEPB Inhale 1 puff into the lungs 2 (two) times daily. 03/26/15  Yes Ria Bush,  MD  mirtazapine (REMERON) 30 MG tablet Take 1 tablet (30 mg total) by mouth at bedtime. 09/04/15  Yes Norma Fredrickson, MD  QUEtiapine (SEROQUEL) 400 MG tablet Take 1 tablet (400 mg total) by mouth at bedtime. 07/03/15  Yes Norma Fredrickson, MD  ziprasidone (GEODON) 60 MG capsule Take 3  capsules each night at dinner 07/03/15  Yes Norma Fredrickson, MD  buPROPion (WELLBUTRIN XL) 150 MG 24 hr tablet Take 3 tablets every morning. Patient taking differently: Take 150 mg by mouth daily.  07/03/15   Norma Fredrickson, MD  lamoTRIgine (LAMICTAL) 200 MG tablet Take 1 tablet (200 mg total) by mouth daily. Patient not taking: Reported on 09/22/2015 07/03/15   Norma Fredrickson, MD  oxyCODONE-acetaminophen (ROXICET) 5-325 MG tablet Take 1 tablet by mouth every 8 (eight) hours as needed for severe  pain. Patient not taking: Reported on 09/22/2015 08/23/15   Ria Bush, MD  tiotropium (SPIRIVA HANDIHALER) 18 MCG inhalation capsule Place 1 capsule (18 mcg total) into inhaler and inhale at bedtime. 08/23/15   Ria Bush, MD   BP 97/66 mmHg  Pulse 99  Temp(Src) 98.5 F (36.9 C) (Oral)  Resp 18  SpO2 100%  LMP 01/04/2015   Physical Exam  Constitutional: She is oriented to person, place, and time. She appears well-developed and well-nourished. No distress.  Thin, but not cachectic  HENT:  Head: Normocephalic and atraumatic.  Mouth/Throat: Oropharynx is clear and moist.  Eyes: Conjunctivae and EOM are normal. Pupils are equal, round, and reactive to light.  Neck: Normal range of motion. Neck supple.  Cardiovascular: Normal rate, regular rhythm and normal heart sounds.   Pulmonary/Chest: Effort normal and breath sounds normal. No respiratory distress. She has no wheezes.  Abdominal: Soft. Bowel sounds are normal. There is no tenderness. There is no guarding.  Musculoskeletal: Normal range of motion.  Neurological: She is alert and oriented to person, place, and time.  AAOx3, answering questions appropriately; equal strength UE and LE bilaterally; CN grossly intact; moves all extremities appropriately without ataxia; no focal neuro deficits or facial asymmetry appreciated  Skin: Skin is warm and dry. She is not diaphoretic.  Psychiatric: She has a normal mood and affect. Her speech is normal. She is not actively hallucinating. She expresses no homicidal and no suicidal ideation. She expresses no suicidal plans and no homicidal plans.  Nursing note and vitals reviewed.   ED Course  Procedures (including critical care time) Labs Review Labs Reviewed - No data to display  Imaging Review No results found. I have personally reviewed and evaluated these images and lab results as part of my medical decision-making.   EKG Interpretation None      MDM   Final diagnoses:   Encounter for medical screening examination   50 year old female here requesting detox from cocaine. Last use was last Wednesday.  No other substance abuse.  Denies SI/HI/AVH.  Patient awake, alert, fully oriented to baseline. She has no physical complaints at this time. Have discussed with patient and family that we no longer offer inpatient detox here in the hospital. While waiting to be seen, they contacted a facility in [place] who has a bed available for her today. Given that she has no current physical complaints, no SI/HI/AVH, does not appear to be a danger to herself or other, vital signs stable, and has scheduled detox i feel she is stable for discharge.  Larene Pickett, PA-C 09/22/15 [place], MD 09/22/15 2050

## 2015-09-23 ENCOUNTER — Encounter (HOSPITAL_COMMUNITY): Payer: Self-pay | Admitting: Family Medicine

## 2015-09-23 DIAGNOSIS — F1421 Cocaine dependence, in remission: Secondary | ICD-10-CM | POA: Insufficient documentation

## 2015-10-18 ENCOUNTER — Ambulatory Visit (HOSPITAL_COMMUNITY): Payer: Self-pay | Admitting: Psychiatry

## 2015-10-25 ENCOUNTER — Telehealth (HOSPITAL_COMMUNITY): Payer: Self-pay

## 2015-10-25 DIAGNOSIS — F3130 Bipolar disorder, current episode depressed, mild or moderate severity, unspecified: Secondary | ICD-10-CM

## 2015-10-25 MED ORDER — ZIPRASIDONE HCL 60 MG PO CAPS: ORAL_CAPSULE | ORAL | Status: DC

## 2015-10-25 NOTE — Telephone Encounter (Signed)
Patient called for a refill on Geodon as she had run out. I spoke with Dr. Casimiro Needle via phone and he authorized me to send in enough until her appointment. I sent in 15 pills, she takes 60 mg 3 po at dinner, this will last her until her appointment on 3/1. I called the patient and let her know and reminded her of the appointment

## 2015-10-30 ENCOUNTER — Encounter (HOSPITAL_COMMUNITY): Payer: Self-pay | Admitting: Psychiatry

## 2015-10-30 ENCOUNTER — Ambulatory Visit (INDEPENDENT_AMBULATORY_CARE_PROVIDER_SITE_OTHER): Payer: 59 | Admitting: Psychiatry

## 2015-10-30 VITALS — BP 120/82 | HR 111 | Ht 62.0 in | Wt 96.2 lb

## 2015-10-30 DIAGNOSIS — F1499 Cocaine use, unspecified with unspecified cocaine-induced disorder: Secondary | ICD-10-CM

## 2015-10-30 DIAGNOSIS — F3162 Bipolar disorder, current episode mixed, moderate: Secondary | ICD-10-CM

## 2015-10-30 DIAGNOSIS — F3130 Bipolar disorder, current episode depressed, mild or moderate severity, unspecified: Secondary | ICD-10-CM

## 2015-10-30 MED ORDER — QUETIAPINE FUMARATE 200 MG PO TABS: ORAL_TABLET | ORAL | Status: DC

## 2015-10-30 MED ORDER — MIRTAZAPINE 30 MG PO TABS: 30.0000 mg | ORAL_TABLET | Freq: Every day | ORAL | Status: DC

## 2015-10-30 MED ORDER — ZIPRASIDONE HCL 80 MG PO CAPS: ORAL_CAPSULE | ORAL | Status: DC
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
## 2015-10-31 LAB — DRUG SCREEN, URINE
AMPHETAMINE SCRN UR: NEGATIVE
BARBITURATE QUANT UR: NEGATIVE
BENZODIAZEPINES.: NEGATIVE
Cocaine Metabolites: NEGATIVE
Creatinine,U: 7.85 mg/dL
METHADONE: NEGATIVE
Marijuana Metabolite: NEGATIVE
Opiates: NEGATIVE
Phencyclidine (PCP): NEGATIVE
Propoxyphene: NEGATIVE

## 2015-11-04 ENCOUNTER — Telehealth (HOSPITAL_COMMUNITY): Payer: Self-pay

## 2015-11-04 NOTE — Telephone Encounter (Signed)
Patient is calling, she said increasing the Seroquel is not helping her anxiety and she would like to know if she can try something different. Please advise, thank you

## 2015-11-13 NOTE — Telephone Encounter (Signed)
Per Dr. Casimiro Needle, he recommended neurontin 300 mg 1 po BID - I called the patient and she stated she was doing much better and would like to keep her meds the way they are.

## 2015-11-21 ENCOUNTER — Other Ambulatory Visit (HOSPITAL_COMMUNITY): Payer: Self-pay

## 2015-11-21 DIAGNOSIS — F3162 Bipolar disorder, current episode mixed, moderate: Secondary | ICD-10-CM

## 2015-11-21 MED ORDER — GABAPENTIN 300 MG PO CAPS: 300.0000 mg | ORAL_CAPSULE | Freq: Two times a day (BID) | ORAL | Status: DC

## 2015-11-21 MED ORDER — GABAPENTIN 300 MG PO CAPS
300.0000 mg | ORAL_CAPSULE | Freq: Two times a day (BID) | ORAL | Status: DC
Start: 2015-11-21 — End: 2015-11-21

## 2015-11-21 NOTE — Telephone Encounter (Signed)
Patient had called last week with anxiety and medication not working, Dr. Casimiro Needle ordered neurontin 300 mg bid. When I called the patient to let her know, she told me that she was feeling better and did not think that she needed it. She called back yesterday and said that the anxiety was back and she would like to go ahead with the neurontin. I spoke to Dr. Casimiro Needle and he was fine with that. I sent the order in and called the patient to let her know

## 2015-12-12 ENCOUNTER — Ambulatory Visit (HOSPITAL_COMMUNITY): Payer: Self-pay | Admitting: Psychiatry

## 2015-12-18 ENCOUNTER — Ambulatory Visit (INDEPENDENT_AMBULATORY_CARE_PROVIDER_SITE_OTHER): Payer: 59 | Admitting: Psychiatry

## 2015-12-18 VITALS — BP 115/75 | HR 97 | Ht 62.0 in | Wt 101.2 lb

## 2015-12-18 DIAGNOSIS — F3130 Bipolar disorder, current episode depressed, mild or moderate severity, unspecified: Secondary | ICD-10-CM

## 2015-12-18 DIAGNOSIS — F3162 Bipolar disorder, current episode mixed, moderate: Secondary | ICD-10-CM

## 2015-12-18 DIAGNOSIS — F1910 Other psychoactive substance abuse, uncomplicated: Secondary | ICD-10-CM | POA: Diagnosis not present

## 2015-12-18 MED ORDER — ZIPRASIDONE HCL 80 MG PO CAPS: ORAL_CAPSULE | ORAL | Status: DC

## 2015-12-18 MED ORDER — TOPIRAMATE 25 MG PO TABS: ORAL_TABLET | ORAL | Status: DC

## 2015-12-18 MED ORDER — MIRTAZAPINE 30 MG PO TABS: 30.0000 mg | ORAL_TABLET | Freq: Every day | ORAL | Status: DC

## 2015-12-18 MED ORDER — GABAPENTIN 300 MG PO CAPS: 300.0000 mg | ORAL_CAPSULE | Freq: Two times a day (BID) | ORAL | Status: DC

## 2015-12-18 MED ORDER — QUETIAPINE FUMARATE 200 MG PO TABS: ORAL_TABLET | ORAL | Status: DC

## 2015-12-18 NOTE — Progress Notes (Signed)
Patient ID: Robin Welch, female   DOB: 1966-03-14, 50 y.o.   MRN: [id number] [facility] MD Progress Note  12/18/2015 2:39 PM Arilene Metters  MRN:  [id number] Subjective:  Very tense Principal Problem: Bipolar 1 disorder most recent episode depressed; substance use disorder cocaine Diagnosis:  Bipolar 1 disorder At this time the patient is better. She's not quite as loud. She's not as intense. She seems to be more contained. She says she's been clean of all drugs for 90 days. She now lives with her son and goes to [place] or NA almost every day. 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 Generally she is sleeping and eating well. Her appetite is increased and she's gained weight taking Remeron. She seems calmer. She is less intense. Her anxiety is better. She takes a variety of medicines to contain her mood state. Today was a 15 minute visit. She was able to tolerate. She denies any auditory or visual hallucinations. She denies any delusional thinking. She denies being euphoric. She denies racing thinking. Her self-esteem is stable. She denies being suicidal. The patient is happy about her daughter is Leisure [employer] manager school and her son who is working at this time. Her other son lives with her husband and he is doing well. So all her children are doing well her mother is doing well and the patient is at school and claims that she is clean. It's noted that her drug screen on her last visit was negative. For now the patient seems to be stable. She's not suicidal. Total Time spent with patient: 30 minutes  Past Psychiatric History:   Past Medical History:  Past Medical History  Diagnosis Date  . Bipolar 1 disorder [facility])     sees Dr. Candis Schatz psych [phone])  . History of tension headache   . History of migraines   . 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 Seizure ([place])     from HA medicine?   Marland Kitchen Hx: UTI (urinary tract infection)   . COPD (chronic obstructive pulmonary disease) ([place])  03/2013    hyperinflation by CXR  . Smoker   . History of cocaine dependence ([place]) latest 2017    undergoing detox    Past Surgical History  Procedure Laterality Date  . Cholecystectomy  2006  . Tubal ligation  2003   Family History:  Family History  Problem Relation Age of Onset  . Diabetes Mother     T2DM  . Breast cancer Mother 81  . Dementia Mother   . Coronary artery disease Father 64    MI  . Cancer Neg Hx   . Stroke Neg Hx   . Anxiety disorder Paternal Aunt   . Anxiety disorder Cousin   . Depression Cousin   . ADD / ADHD Child   . Anxiety disorder Child   . Depression Child    Family Psychiatric  History:  Social History:  History  Alcohol Use No     History  Drug Use No    Social History   Social History  . Marital Status: Married    Spouse Name: N/A  . Number of Children: 2  . 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 Smoking status: Current Every Day Smoker -- 2.00 packs/day    Types: Cigarettes  . Smokeless tobacco:  Never Used     Comment: started age 55yo, tried chantix in past  . Alcohol Use: No  . Drug Use: No  . Sexual Activity: Yes    Birth Control/ Protection: Surgical   Other Topics Concern  . Not on file   Social History Narrative   Caffeine: 40 oz diet mountain dew/day   Lives with husband and 2 children, no pets         Additional Social History:                         Sleep: Good  Appetite:  Good  Current Medications: Current Outpatient Prescriptions  Medication Sig Dispense Refill  . albuterol (PROVENTIL HFA;VENTOLIN HFA) 108 (90 BASE) MCG/ACT inhaler Inhale 2 puffs into the lungs every 6 (six) hours as needed for wheezing or shortness of breath. 3 Inhaler 3  . Fluticasone-Salmeterol (ADVAIR DISKUS) 250-50 MCG/DOSE AEPB Inhale 1 puff into the lungs 2 (two) times daily. 3 each 3  . gabapentin  (NEURONTIN) 300 MG capsule Take 1 capsule (300 mg total) by mouth 2 (two) times daily. 60 capsule 2  . mirtazapine (REMERON) 30 MG tablet Take 1 tablet (30 mg total) by mouth at bedtime. 30 tablet 3  . oxyCODONE-acetaminophen (ROXICET) 5-325 MG tablet Take 1 tablet by mouth every 8 (eight) hours as needed for severe pain. (Patient not taking: Reported on 09/22/2015) 20 tablet 0  . QUEtiapine (SEROQUEL) 200 MG tablet 3  qhs 90 tablet 3  . tiotropium (SPIRIVA HANDIHALER) 18 MCG inhalation capsule Place 1 capsule (18 mcg total) into inhaler and inhale at bedtime. 30 capsule 12  . topiramate (TOPAMAX) 25 MG tablet 1  tid 90 tablet 2  . ziprasidone (GEODON) 80 MG capsule Take 3  capsules each night at dinner 60 capsule 4   No current facility-administered medications for this visit.    Lab Results: No results found for this or any previous visit (from the past 48 hour(s)).  Blood Alcohol level:  No results found for: [facility]  Physical Findings: AIMS:  , ,  ,  ,    CIWA:    COWS:     Musculoskeletal: Strength & Muscle Tone: within normal limits Gait & Station: normal Patient leans: N/A  Psychiatric Specialty Exam: ROS  Blood pressure 115/75, pulse 97, height 5\' 2"  (1.575 m), weight 101 lb 3.2 oz (45.904 kg), last menstrual period 01/04/2015.Body mass index is 18.51 kg/(m^2).  General Appearance: Disheveled  Eye Sport and exercise psychologist::  Fair  Speech:  Pressured  Volume:  Increased  Mood:  Anxious  Affect:  Non-Congruent  Thought Process:  Coherent  Orientation:  Full (Time, Place, and Person)  Thought Content:  WDL  Suicidal Thoughts:  No  Homicidal Thoughts:  No  Memory:  NA  Judgement:  Fair  Insight:  Fair  Psychomotor Activity:  Increased  Concentration:  Good  Recall:  Good  Fund of Knowledge:Good  Language: Fair  Akathisia:  No  Handed:  Right  AIMS (if indicated):     Assets: Desire to improve   ADL's:  Intact  Cognition: WNL  Sleep:      Treatment Plan Summary: At this time the  patient will continue taking Seroquel which she says helps her great deal. She says it helps her anxiety helps her sleep. She takes 600 mg at night. She also takes 80 mg of Geodon taking 3 of them at dinner time. This seems to work as well. The patient  also takes Neurontin 300 mg twice a day. She takes Topamax 25 mg 3 times a day. She'll continue this. I suspect this patient has more of a characterological disorder that anything. The possibility that she has admixed bipolar disorder is highly likely is. at this time she is actually functioning fairly well. She's getting along better. I asked her to lower her voice and she did so. He smiled and says she realizes she talks loudly. She was very appropriate through this interview. The patient also takes Remeron 30 mg which I think is a great thing for her. This patient to return to see me in 3 months for a 30 minute visit. Haskel Schroeder, MD 12/18/2015, 2:39 PM

## 2016-01-22 ENCOUNTER — Encounter (HOSPITAL_COMMUNITY): Payer: Self-pay

## 2016-02-24 ENCOUNTER — Telehealth (HOSPITAL_COMMUNITY): Payer: Self-pay

## 2016-02-24 ENCOUNTER — Other Ambulatory Visit (HOSPITAL_COMMUNITY): Payer: Self-pay | Admitting: Psychiatry

## 2016-02-24 DIAGNOSIS — F3130 Bipolar disorder, current episode depressed, mild or moderate severity, unspecified: Secondary | ICD-10-CM

## 2016-02-24 NOTE — Telephone Encounter (Signed)
[place] is calling, [place] new order sent to the pharmacy, She has a follow up appointment on 7/13 - please review and advise, thank you

## 2016-02-25 ENCOUNTER — Other Ambulatory Visit: Payer: Self-pay

## 2016-02-25 MED ORDER — ALBUTEROL SULFATE HFA 108 (90 BASE) MCG/ACT IN AERS: 2.0000 | INHALATION_SPRAY | Freq: Four times a day (QID) | RESPIRATORY_TRACT | Status: DC | PRN

## 2016-02-25 NOTE — Telephone Encounter (Signed)
Pt left v/m requesting refill albuterol inhaler to [facility]. Last seen 04/03/15. Pt notified done and pt will cb for f/u appt.

## 2016-02-28 MED ORDER — QUETIAPINE FUMARATE 200 MG PO TABS: ORAL_TABLET | ORAL | Status: DC

## 2016-02-28 NOTE — Telephone Encounter (Signed)
I spoke with Dr. Casimiro Needle and he approved an order for the Seroquel, enough to get her through to her follow up on 7/13. This was sone and I called patient to let her know

## 2016-03-05 ENCOUNTER — Telehealth (HOSPITAL_COMMUNITY): Payer: Self-pay

## 2016-03-18 ENCOUNTER — Encounter (HOSPITAL_COMMUNITY): Payer: Self-pay | Admitting: Psychiatry

## 2016-03-18 ENCOUNTER — Ambulatory Visit (INDEPENDENT_AMBULATORY_CARE_PROVIDER_SITE_OTHER): Payer: 59 | Admitting: Psychiatry

## 2016-03-18 VITALS — BP 102/64 | HR 111 | Ht 62.0 in | Wt 101.4 lb

## 2016-03-18 DIAGNOSIS — F3160 Bipolar disorder, current episode mixed, unspecified: Secondary | ICD-10-CM

## 2016-03-18 DIAGNOSIS — F3162 Bipolar disorder, current episode mixed, moderate: Secondary | ICD-10-CM

## 2016-03-18 DIAGNOSIS — F3130 Bipolar disorder, current episode depressed, mild or moderate severity, unspecified: Secondary | ICD-10-CM | POA: Diagnosis not present

## 2016-03-18 MED ORDER — GABAPENTIN 300 MG PO CAPS: ORAL_CAPSULE | ORAL | Status: DC

## 2016-03-18 MED ORDER — ZIPRASIDONE HCL 80 MG PO CAPS: ORAL_CAPSULE | ORAL | Status: DC

## 2016-03-18 MED ORDER — QUETIAPINE FUMARATE 400 MG PO TABS: ORAL_TABLET | ORAL | Status: DC

## 2016-03-18 NOTE — Progress Notes (Signed)
Patient ID: Robin Welch, female   DOB: 24-Mar-1966, 50 y.o.   MRN: [id number] Patient ID: Robin Welch, female   DOB: 1965/10/28, 50 y.o.   MRN: [id number] [facility] MD Progress Note  03/18/2016 3:12 PM Joyell Thi  MRN:  [id number] Subjective:  Very tense Principal Problem: Bipolar 1 disorder most recent episode depressed; substance use disorder cocaine Diagnosis:  Bipolar 1 disorder Today the patient is doing fairly well. She does describe significant anxiety in the last 3 weeks. She is very comfortable. She describes herself as being irritable. She's not asked me for a benzodiazepine. She claims she's not drinking or using drugs and that she's going to NA and AA on a daily basis. Generally I believe her. She lives with her son age 47 and she spends time with her other son age 51 during the day. Her husband is in the midst of getting a divorce from her comes in 6 months. She claims she's getting along fairly well with her soon-to-be ex-husband. She also is dating an older man who is very supportive of her. She likes him a lot. She is no sexual relationship with. The patient says she sleeping and eating well. She's got lots of energy. One thing that has ended his she was substituting teaching a lot and that's come to an distinctly about a month ago. She doesn't quite believe that is related to her anxiety. I suspect the fact that she does not have structure might be increasing her anxiety or irritability. It is hard for her to understand. She is no evidence of psychosis. She is on multiple psychiatric medications. She also has an issue in that her mother is not well. She has cancer and Alzheimer's disease and she is involved in her care. Nonetheless tardive again I specific anxiety source. One could think that simply she has unstructured time could do this and it's possible. I will go slow. Past Psychiatric History:   Past Medical History:  Past Medical History  Diagnosis Date  . Bipolar 1 disorder [facility])     sees  Dr. Candis Schatz psych [phone])  . History of tension headache   . History of migraines   . 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 Seizure ([place])     from HA medicine?   Marland Kitchen Hx: UTI (urinary tract infection)   . COPD (chronic obstructive pulmonary disease) ([place]) 03/2013    hyperinflation by CXR  . Smoker   . History of cocaine dependence ([place]) latest 2017    undergoing detox    Past Surgical History  Procedure Laterality Date  . Cholecystectomy  2006  . Tubal ligation  2003   Family History:  Family History  Problem Relation Age of Onset  . Diabetes Mother     T2DM  . Breast cancer Mother 18  . Dementia Mother   . Coronary artery disease Father 65    MI  . Cancer Neg Hx   . Stroke Neg Hx   . Anxiety disorder Paternal Aunt   . Anxiety disorder Cousin   . Depression Cousin   . ADD / ADHD Child   . Anxiety disorder Child   . Depression Child    Family Psychiatric  History:  Social History:  History  Alcohol Use No     History  Drug Use No    Social History   Social History  . Marital Status: Married    Spouse Name: N/A  . Number of Children:  2  . 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 Smoking status: Current Every Day Smoker -- 2.00 packs/day    Types: Cigarettes  . Smokeless tobacco: Never Used     Comment: started age 88yo, tried chantix in past  . Alcohol Use: No  . Drug Use: No  . Sexual Activity: Yes    Birth Control/ Protection: Surgical   Other Topics Concern  . None   Social History Narrative   Caffeine: 40 oz diet mountain dew/day   Lives with husband and 2 children, no pets         Additional Social History:                         Sleep: Good  Appetite:  Good  Current Medications: Current Outpatient Prescriptions  Medication Sig Dispense Refill  . albuterol (PROVENTIL HFA;VENTOLIN HFA) 108 (90  Base) MCG/ACT inhaler Inhale 2 puffs into the lungs every 6 (six) hours as needed for wheezing or shortness of breath. 3 Inhaler 0  . Fluticasone-Salmeterol (ADVAIR DISKUS) 250-50 MCG/DOSE AEPB Inhale 1 puff into the lungs 2 (two) times daily. 3 each 3  . gabapentin (NEURONTIN) 300 MG capsule 2  bid 120 capsule 5  . mirtazapine (REMERON) 30 MG tablet Take 1 tablet (30 mg total) by mouth at bedtime. 90 tablet 0  . oxyCODONE-acetaminophen (ROXICET) 5-325 MG tablet Take 1 tablet by mouth every 8 (eight) hours as needed for severe pain. 20 tablet 0  . QUEtiapine (SEROQUEL) 400 MG tablet 2 qhs 60 tablet 4  . tiotropium (SPIRIVA HANDIHALER) 18 MCG inhalation capsule Place 1 capsule (18 mcg total) into inhaler and inhale at bedtime. 30 capsule 12  . topiramate (TOPAMAX) 25 MG tablet 1  tid 270 tablet 0  . ziprasidone (GEODON) 80 MG capsule Take 3  capsules each night at dinner 90 capsule 3   No current facility-administered medications for this visit.    Lab Results: No results found for this or any previous visit (from the past 48 hour(s)).  Blood Alcohol level:  No results found for: [facility]  Physical Findings: AIMS:  , ,  ,  ,    CIWA:    COWS:     Musculoskeletal: Strength & Muscle Tone: within normal limits Gait & Station: normal Patient leans: N/A  Psychiatric Specialty Exam: ROS  Blood pressure 102/64, pulse 111, height 5\' 2"  (1.575 m), weight 101 lb 6.4 oz (45.995 kg), last menstrual period 01/04/2015.Body mass index is 18.54 kg/(m^2).  General Appearance: Disheveled  Eye Sport and exercise psychologist::  Fair  Speech:  Pressured  Volume:  Increased  Mood:  Anxious  Affect:  Non-Congruent  Thought Process:  Coherent  Orientation:  Full (Time, Place, and Person)  Thought Content:  WDL  Suicidal Thoughts:  No  Homicidal Thoughts:  No  Memory:  NA  Judgement:  Fair  Insight:  Fair  Psychomotor Activity:  Increased  Concentration:  Good  Recall:  Good  Fund of Knowledge:Good  Language: Fair   Akathisia:  No  Handed:  Right  AIMS (if indicated):     Assets: Desire to improve   ADL's:  Intact  Cognition: WNL  Sleep:      Treatment Plan Summary: At this time we will go ahead and discontinue her Topamax which is started in the hospital supposedly for  anxiety. I suspect he was probably started for her bipolar disorder and it is not a very successful general drug. Today we will do is increase her Seroquel. When she was increased to 600 mg she said she felt a distinct improvement. Today we'll go ahead and maximize it at 400 mg taking 2 at night. She'll continue taking Geodon 80 mg 3 at dinner. We will increase her Neurontin from 300 mg twice a day to taking 2 in the morning and 2 at night. The patient will continue taking Remeron for now as 30 mg. The patient will continue in NA and also in AA. She'll return to see me in 7 weeks for a 30 minute visit. Overall she is fairly stable. I will interpret her anxiety is simply irritability. The patient denies any physical complaints. Haskel Schroeder, MD 03/18/2016, 3:12 PM

## 2016-03-19 ENCOUNTER — Other Ambulatory Visit (HOSPITAL_COMMUNITY): Payer: Self-pay

## 2016-03-19 MED ORDER — MIRTAZAPINE 30 MG PO TABS: 30.0000 mg | ORAL_TABLET | Freq: Every day | ORAL | Status: DC

## 2016-04-22 ENCOUNTER — Other Ambulatory Visit (HOSPITAL_COMMUNITY): Payer: Self-pay

## 2016-04-22 ENCOUNTER — Telehealth (HOSPITAL_COMMUNITY): Payer: Self-pay

## 2016-04-22 DIAGNOSIS — F3162 Bipolar disorder, current episode mixed, moderate: Secondary | ICD-10-CM

## 2016-04-22 MED ORDER — GABAPENTIN 300 MG PO CAPS: ORAL_CAPSULE | ORAL | 3 refills | Status: DC

## 2016-05-26 ENCOUNTER — Ambulatory Visit: Payer: Self-pay | Admitting: Family Medicine

## 2016-05-26 ENCOUNTER — Telehealth (HOSPITAL_COMMUNITY): Payer: Self-pay | Admitting: *Deleted

## 2016-05-26 NOTE — Telephone Encounter (Signed)
Pt lvm on 05/25/16 stating she is out of Geodon. Pt states pharmacy only gave her 60 pills instead of 90. Pt takes Geodon 80mg , TID. Contact [facility], confirmed pt received Geodon 80mg , #60 on 9/3. Pt insurance requires a prior authorization for a quantity of 90. Pharmacy will fax prior authorization request to clinic. Will fax prior authorization to [facility].

## 2016-05-26 NOTE — Telephone Encounter (Signed)
Prior authorization for Geodon received. Called [phone] spoke with Baldo Ash who gave approval [id number] from 04/26/16-05/26/19. Called to notify pharmacy.

## 2016-06-11 ENCOUNTER — Other Ambulatory Visit (HOSPITAL_COMMUNITY): Payer: Self-pay

## 2016-06-11 MED ORDER — BUSPIRONE HCL 10 MG PO TABS: ORAL_TABLET | ORAL | 2 refills | Status: DC

## 2016-06-12 ENCOUNTER — Ambulatory Visit (HOSPITAL_COMMUNITY): Payer: Self-pay | Admitting: Psychiatry

## 2016-06-30 ENCOUNTER — Telehealth (HOSPITAL_COMMUNITY): Payer: Self-pay

## 2016-06-30 NOTE — Telephone Encounter (Signed)
Patient is calling to request a change in medication. She is currently on Buspar for her anxiety, however patient states it is not working, she is having back to back panic attacks. Please review and advise, thank you

## 2016-07-01 ENCOUNTER — Other Ambulatory Visit (HOSPITAL_COMMUNITY): Payer: Self-pay | Admitting: Psychiatry

## 2016-07-01 ENCOUNTER — Telehealth: Payer: Self-pay | Admitting: Family Medicine

## 2016-07-01 DIAGNOSIS — F3162 Bipolar disorder, current episode mixed, moderate: Secondary | ICD-10-CM

## 2016-07-01 MED ORDER — BUSPIRONE HCL 15 MG PO TABS: ORAL_TABLET | ORAL | 2 refills | Status: DC

## 2016-07-01 NOTE — Telephone Encounter (Signed)
Per Dr. Casimiro Needle: increase Buspar to 15 mg, 2 po qam and 1 po qhs. This order was sent to the pharmacy and patient was informed.

## 2016-07-02 MED ORDER — ALBUTEROL SULFATE HFA 108 (90 BASE) MCG/ACT IN AERS: 2.0000 | INHALATION_SPRAY | Freq: Four times a day (QID) | RESPIRATORY_TRACT | 3 refills | Status: DC | PRN

## 2016-07-02 NOTE — Telephone Encounter (Signed)
Pt left voicemail at Triage. Pt said that the inhaler that was sent in yesterday was to expensive for her to get it was $57, pt is requesting a different inhaler sent to her pharmacy on file

## 2016-07-02 NOTE — Telephone Encounter (Signed)
Price out new inhaler sent in.

## 2016-07-06 ENCOUNTER — Telehealth (HOSPITAL_COMMUNITY): Payer: Self-pay

## 2016-07-06 NOTE — Telephone Encounter (Signed)
Patient is calling because the Buspar that was prescribed for anxiety is not working. Patient would like to try something else or go back to Gabapentin.Please review and advise, thank you

## 2016-07-08 ENCOUNTER — Emergency Department (HOSPITAL_COMMUNITY)
Admission: EM | Admit: 2016-07-08 | Discharge: 2016-07-08 | Disposition: A | Discharge status: Home / Self Care | Payer: Commercial Managed Care - HMO | Attending: Emergency Medicine | Admitting: Emergency Medicine

## 2016-07-08 ENCOUNTER — Encounter (HOSPITAL_COMMUNITY): Payer: Self-pay

## 2016-07-08 ENCOUNTER — Other Ambulatory Visit (HOSPITAL_COMMUNITY): Payer: Self-pay

## 2016-07-08 DIAGNOSIS — J449 Chronic obstructive pulmonary disease, unspecified: Secondary | ICD-10-CM | POA: Diagnosis not present

## 2016-07-08 DIAGNOSIS — Z79899 Other long term (current) drug therapy: Secondary | ICD-10-CM | POA: Insufficient documentation

## 2016-07-08 DIAGNOSIS — F17210 Nicotine dependence, cigarettes, uncomplicated: Secondary | ICD-10-CM | POA: Diagnosis not present

## 2016-07-08 DIAGNOSIS — F410 Panic disorder [episodic paroxysmal anxiety] without agoraphobia: Secondary | ICD-10-CM | POA: Diagnosis not present

## 2016-07-08 DIAGNOSIS — Y638 Failure in dosage during other surgical and medical care: Secondary | ICD-10-CM | POA: Diagnosis not present

## 2016-07-08 DIAGNOSIS — F3162 Bipolar disorder, current episode mixed, moderate: Secondary | ICD-10-CM

## 2016-07-08 DIAGNOSIS — T424X6A Underdosing of benzodiazepines, initial encounter: Secondary | ICD-10-CM | POA: Diagnosis not present

## 2016-07-08 DIAGNOSIS — F419 Anxiety disorder, unspecified: Secondary | ICD-10-CM | POA: Diagnosis present

## 2016-07-08 LAB — COMPREHENSIVE METABOLIC PANEL
ALBUMIN: 3.5 g/dL (ref 3.5–5.0)
ALK PHOS: 77 U/L (ref 38–126)
ALT: 14 U/L (ref 14–54)
ANION GAP: 7 (ref 5–15)
AST: 19 U/L (ref 15–41)
BILIRUBIN TOTAL: 0.4 mg/dL (ref 0.3–1.2)
CALCIUM: 8.8 mg/dL — AB (ref 8.9–10.3)
CO2: 26 mmol/L (ref 22–32)
Chloride: 99 mmol/L — ABNORMAL LOW (ref 101–111)
Creatinine, Ser: 0.66 mg/dL (ref 0.44–1.00)
GFR calc Af Amer: >60 mL/min (ref >60)
GFR calc non Af Amer: >60 mL/min (ref >60)
GLUCOSE: 78 mg/dL (ref 65–99)
Potassium: 4.2 mmol/L (ref 3.5–5.1)
SODIUM: 132 mmol/L — AB (ref 135–145)
Total Protein: 6.5 g/dL (ref 6.5–8.1)

## 2016-07-08 LAB — RAPID URINE DRUG SCREEN, HOSP PERFORMED
AMPHETAMINES: NOT DETECTED
Barbiturates: NOT DETECTED
Benzodiazepines: NOT DETECTED
Cocaine: NOT DETECTED
Opiates: NOT DETECTED
TETRAHYDROCANNABINOL: NOT DETECTED

## 2016-07-08 LAB — CBC
HCT: 36.1 % (ref 36.0–46.0)
Hemoglobin: 11.8 g/dL — ABNORMAL LOW (ref 12.0–15.0)
MCH: 28.4 pg (ref 26.0–34.0)
MCHC: 32.7 g/dL (ref 30.0–36.0)
MCV: 87 fL (ref 78.0–100.0)
PLATELETS: 455 10*3/uL — AB (ref 150–400)
RBC: 4.15 MIL/uL (ref 3.87–5.11)
RDW: 13.4 % (ref 11.5–15.5)
WBC: 6.6 10*3/uL (ref 4.0–10.5)

## 2016-07-08 LAB — ETHANOL: Alcohol, Ethyl (B): <5 mg/dL (ref <5)

## 2016-07-08 MED ORDER — HYDROXYZINE HCL 25 MG PO TABS: 25.0000 mg | ORAL_TABLET | Freq: Four times a day (QID) | ORAL | 0 refills | Status: DC | PRN

## 2016-07-08 MED ORDER — LORAZEPAM 1 MG PO TABS
1.0000 mg | ORAL_TABLET | Freq: Once | ORAL | Status: AC
  Administered 2016-07-08: 1 mg via ORAL
  Filled 2016-07-08: qty 1

## 2016-07-08 MED ORDER — GABAPENTIN 300 MG PO CAPS: ORAL_CAPSULE | ORAL | 0 refills | Status: DC

## 2016-07-08 MED ORDER — GABAPENTIN 300 MG PO CAPS: ORAL_CAPSULE | ORAL | 3 refills | Status: DC

## 2016-07-08 NOTE — ED Notes (Signed)
Pt. Verbalized understanding of teaching and prescription with no questions. Ambulatory at discharge with NAD.

## 2016-07-08 NOTE — ED Notes (Signed)
ED Provider at bedside. 

## 2016-07-08 NOTE — ED Triage Notes (Addendum)
Patient states she is having recurrent panic attacks the past 30 days since running out of xanax. States that she was taking 2mg  /3 times per day and has been out 3-4 weeks. Alert and oriented. Denies suicidal/homicidal thoughts. Patient appears anxious and restless on arrival.

## 2016-07-08 NOTE — ED Provider Notes (Signed)
[facility] DEPT Provider Note   CSN: [id number] Arrival date & time: 07/08/16  1417     History   Chief Complaint Panic Attacks, Med Refill  HPI Robin Welch is a 50 y.o. female.  Patient presents with anxiety/panic after she has been out of her Xanax for the last three weeks. Has been under stress because she is going through a divorce. Has tried Klonopin in the past with no relief. She denies SI, HI, or AVH. She denies physical complaints.   The history is provided by the patient. No language interpreter was used.  Mental Health Problem  Presenting symptoms: no suicidal thoughts, no suicidal threats and no suicide attempt   Presenting symptoms comment:  Anxiety Degree of incapacity (severity):  Moderate Onset quality:  Gradual Timing:  Constant Progression:  Worsening Chronicity:  Recurrent Context: recent medication change and stressful life event   Relieved by:  Anti-anxiety medications and benzodiazepines Worsened by:  Family interactions Ineffective treatments:  Mood stabilizers and antipsychotics Associated symptoms: anxiety   Risk factors: hx of mental illness   Risk factors: no recent psychiatric admission     Past Medical History:  Diagnosis Date  . Bipolar 1 disorder [facility])    sees Dr. Candis Schatz psych [phone])  . COPD (chronic obstructive pulmonary disease) ([place]) 03/2013   hyperinflation by CXR  . History of cocaine dependence ([place]) latest 2017   undergoing detox  . History of migraines   . History of tension headache   . Hx: UTI (urinary tract infection)   . Positive PPD 1989   s/p CXR and INH 6 mo  . Seizure ([place])    from HA medicine?   . Smoker     Patient Active Problem List   Diagnosis Date Noted  . History of cocaine dependence ([place])   . Left-sided chest wall pain 08/23/2015  . Migraine 05/08/2015  . Hoarseness of voice 04/04/2015  . Cold intolerance 03/08/2015  . Mottled skin 03/08/2015  . Abnormal weight loss 03/08/2015  . Candida  vaginitis 01/08/2015  . COPD (chronic obstructive pulmonary disease) with chronic bronchitis ([place]) 09/22/2013  . COPD exacerbation ([place]) 09/22/2013  . Tobacco abuse 12/18/2010  . Bipolar I disorder, most recent episode (or current) depressed, in partial or unspecified remission 12/18/2010  . Healthcare maintenance 12/18/2010    Past Surgical History:  Procedure Laterality Date  . CHOLECYSTECTOMY  2006  . TUBAL LIGATION  2003    OB History    No data available       Home Medications    Prior to Admission medications   Medication Sig Start Date End Date Taking? Authorizing Provider  albuterol (PROVENTIL HFA;VENTOLIN HFA) 108 (90 Base) MCG/ACT inhaler Inhale 2 puffs into the lungs every 6 (six) hours as needed for wheezing or shortness of breath. 07/02/16  Yes Ria Bush, MD  QUEtiapine (SEROQUEL) 400 MG tablet 2 qhs Patient taking differently: Take 800 mg by mouth at bedtime. 2 qhs 03/18/16  Yes Norma Fredrickson, MD  ziprasidone (GEODON) 80 MG capsule Take 3  capsules each night at dinner Patient taking differently: Take 240 mg by mouth every evening. Take 3  capsules each night at dinner 03/18/16  Yes Norma Fredrickson, MD  busPIRone (BUSPAR) 15 MG tablet Take 2 tablets in the am and 1 tablet at bedtime as needed for anxiety Patient not taking: Reported on 07/08/2016 07/01/16   Norma Fredrickson, MD  Fluticasone-Salmeterol (ADVAIR DISKUS) 250-50 MCG/DOSE AEPB Inhale 1 puff into the lungs 2 (two) times daily.  Patient not taking: Reported on 07/08/2016 03/26/15   Ria Bush, MD  gabapentin (NEURONTIN) 300 MG capsule 4 po qam and 4 po qhs Patient not taking: Reported on 07/08/2016 07/08/16   Norma Fredrickson, MD  hydrOXYzine (ATARAX/VISTARIL) 25 MG tablet Take 1 tablet (25 mg total) by mouth every 6 (six) hours as needed for anxiety. 07/08/16   Harlin Heys, MD  mirtazapine (REMERON) 30 MG tablet Take 1 tablet (30 mg total) by mouth at bedtime. Patient not taking: Reported on 07/08/2016  03/19/16   Norma Fredrickson, MD  oxyCODONE-acetaminophen (ROXICET) 5-325 MG tablet Take 1 tablet by mouth every 8 (eight) hours as needed for severe pain. Patient not taking: Reported on 07/08/2016 08/23/15   Ria Bush, MD  tiotropium (SPIRIVA HANDIHALER) 18 MCG inhalation capsule Place 1 capsule (18 mcg total) into inhaler and inhale at bedtime. Patient not taking: Reported on 07/08/2016 08/23/15   Ria Bush, MD  topiramate (TOPAMAX) 25 MG tablet 1  tid Patient not taking: Reported on 07/08/2016 12/18/15   Norma Fredrickson, MD    Family History Family History  Problem Relation Age of Onset  . Diabetes Mother     T2DM  . Breast cancer Mother 106  . Dementia Mother   . Coronary artery disease Father 5    MI  . Anxiety disorder Paternal Aunt   . Anxiety disorder Cousin   . Depression Cousin   . ADD / ADHD Child   . Anxiety disorder Child   . Depression Child   . Cancer Neg Hx   . Stroke Neg Hx     Social History Social History  Substance Use Topics  . Smoking status: Current Every Day Smoker    Packs/day: 2.00    Types: Cigarettes  . Smokeless tobacco: Never Used     Comment: started age 31yo, tried chantix in past  . Alcohol use No     Allergies   Prozac [fluoxetine hcl]; Doxycycline; and Azithromycin   Review of Systems Review of Systems  Constitutional: Negative.   HENT: Negative.   Respiratory: Negative.   Cardiovascular: Negative.   Gastrointestinal: Negative.   Genitourinary: Negative.   Musculoskeletal: Negative.   Skin: Negative.   Allergic/Immunologic: Negative for immunocompromised state.  Neurological: Negative.   Hematological: Does not bruise/bleed easily.  Psychiatric/Behavioral: Negative for suicidal ideas. The patient is nervous/anxious.      Physical Exam Updated Vital Signs BP 134/86   Pulse 97   Temp 97.7 F (36.5 C) (Oral)   Resp 16   Ht 5\' 2"  (1.575 m)   Wt 48.1 kg   LMP 01/04/2015   SpO2 99%   BMI 19.39 kg/m    Physical Exam  Constitutional: She is oriented to person, place, and time. She appears well-developed and well-nourished. No distress.  HENT:  Head: Normocephalic and atraumatic.  Eyes: Conjunctivae and EOM are normal. Pupils are equal, round, and reactive to light.  Neck: Normal range of motion. Neck supple.  Cardiovascular: Regular rhythm, normal heart sounds and intact distal pulses.  Tachycardia present.  Exam reveals no gallop and no friction rub.   No murmur heard. Pulses:      Radial pulses are 2+ on the right side, and 2+ on the left side.       Dorsalis pedis pulses are 2+ on the right side, and 2+ on the left side.  Pulmonary/Chest: Effort normal and breath sounds normal.  Abdominal: Soft. She exhibits no distension. There is no tenderness. There is no rebound and  no guarding.  Musculoskeletal: She exhibits no edema.  Neurological: She is alert and oriented to person, place, and time.  Skin: Skin is warm and dry. She is not diaphoretic.  Psychiatric: Her behavior is normal. Judgment normal. Her mood appears anxious. Cognition and memory are normal. She expresses no suicidal ideation. She expresses no suicidal plans.     ED Treatments / Results  Labs (all labs ordered are listed, but only abnormal results are displayed) Labs Reviewed  COMPREHENSIVE METABOLIC PANEL - Abnormal; Notable for the following:       Result Value   Sodium 132 (*)    Chloride 99 (*)    BUN <5 (*)    Calcium 8.8 (*)    All other components within normal limits  CBC - Abnormal; Notable for the following:    Hemoglobin 11.8 (*)    Platelets 455 (*)    All other components within normal limits  ETHANOL  RAPID URINE DRUG SCREEN, HOSP PERFORMED    EKG  EKG Interpretation None       Radiology No results found.  Procedures Procedures (including critical care time)  Medications Ordered in ED Medications  LORazepam (ATIVAN) tablet 1 mg (1 mg Oral Given 07/08/16 1703)     Initial  Impression / Assessment and Plan / ED Course  I have reviewed the triage vital signs and the nursing notes.  Pertinent labs & imaging results that were available during my care of the patient were reviewed by me and considered in my medical decision making (see chart for details).  Clinical Course     Patient presents with anxiety after being out of her Xanax for the last three weeks. Precipitated by a divorce and a scheduled court date. She denies physical complaints and is anxious but otherwise well-appearing. Borderline tachycardic with otherwise normal vitals. She denies SI, HI, or AVH. Do not suspect withdrawal as she reports her last use of benzodiazepines was three weeks ago. She was given a one-time dose of ativan here for symptom relief and a prescription for hydroxyzine for outpatient use. Has a follow-up appointment scheduled eight days from now. She is in good condition for discharge home.  Final Clinical Impressions(s) / ED Diagnoses   Final diagnoses:  Panic attacks    New Prescriptions Discharge Medication List as of 07/08/2016  5:03 PM    START taking these medications   Details  hydrOXYzine (ATARAX/VISTARIL) 25 MG tablet Take 1 tablet (25 mg total) by mouth every 6 (six) hours as needed for anxiety., Starting Wed 07/08/2016, Print         Harlin Heys, MD 07/08/16 2136    Tanna Furry, MD 07/28/16 [phone]

## 2016-07-08 NOTE — ED Notes (Signed)
Per MD pt. Safe to drive after 1mg  ativan

## 2016-07-08 NOTE — ED Provider Notes (Signed)
Pt seen and evaluated.  D/W Dr. Ellene Route. Pt presents 3 weeks without Xanax. Does not appear be in withdrawal. Gets anxious with conversation but is calm and heart rate improves when left alone. Oriented lucid but anxious. Agree with singlebenzodiazepine here. We'll offer hydroxyzine as needed. She has a new appointment with her new physician in 8 days.   Tanna Furry, MD 07/08/16 [phone]

## 2016-07-08 NOTE — Telephone Encounter (Signed)
Called patient and let her know that Dr. Casimiro Needle approved the Gabapentin and it was sent to the pharmacy, patient indicated that she was not wanting to go back on gabapentin, I reminded her that she asked for it in the message she left me. Patient is frustrated, I think she would rather have Klonopin or Xanax - she kept saying that her anxiety is through the roof and nothing we have given her helps. She is coming in for a visit tomorrow.

## 2016-07-09 ENCOUNTER — Telehealth: Payer: Self-pay

## 2016-07-09 ENCOUNTER — Ambulatory Visit (INDEPENDENT_AMBULATORY_CARE_PROVIDER_SITE_OTHER): Payer: Commercial Managed Care - HMO | Admitting: Psychiatry

## 2016-07-09 DIAGNOSIS — F17210 Nicotine dependence, cigarettes, uncomplicated: Secondary | ICD-10-CM

## 2016-07-09 DIAGNOSIS — F3130 Bipolar disorder, current episode depressed, mild or moderate severity, unspecified: Secondary | ICD-10-CM

## 2016-07-09 DIAGNOSIS — Z833 Family history of diabetes mellitus: Secondary | ICD-10-CM

## 2016-07-09 DIAGNOSIS — F3162 Bipolar disorder, current episode mixed, moderate: Secondary | ICD-10-CM | POA: Diagnosis not present

## 2016-07-09 DIAGNOSIS — Z803 Family history of malignant neoplasm of breast: Secondary | ICD-10-CM | POA: Diagnosis not present

## 2016-07-09 DIAGNOSIS — Z9049 Acquired absence of other specified parts of digestive tract: Secondary | ICD-10-CM

## 2016-07-09 DIAGNOSIS — Z79899 Other long term (current) drug therapy: Secondary | ICD-10-CM

## 2016-07-09 DIAGNOSIS — Z818 Family history of other mental and behavioral disorders: Secondary | ICD-10-CM

## 2016-07-09 MED ORDER — QUETIAPINE FUMARATE 400 MG PO TABS: 800.0000 mg | ORAL_TABLET | Freq: Every day | ORAL | 5 refills | Status: DC

## 2016-07-09 MED ORDER — GABAPENTIN 300 MG PO CAPS: ORAL_CAPSULE | ORAL | 4 refills | Status: DC

## 2016-07-09 MED ORDER — HYDROXYZINE HCL 25 MG PO TABS: ORAL_TABLET | ORAL | 4 refills | Status: DC

## 2016-07-09 MED ORDER — BUSPIRONE HCL 15 MG PO TABS: ORAL_TABLET | ORAL | 3 refills | Status: DC

## 2016-07-09 MED ORDER — ZIPRASIDONE HCL 80 MG PO CAPS: 240.0000 mg | ORAL_CAPSULE | Freq: Every evening | ORAL | 4 refills | Status: DC

## 2016-07-09 NOTE — Telephone Encounter (Signed)
Pt said she received letter that proair is no longer covered on ins. I advised pt to contact ins co and see what med could be substituted that is more affordable. Pt voiced understanding and will cb with info.

## 2016-07-09 NOTE — Progress Notes (Signed)
Patient ID: Robin Welch, female   DOB: [birth year], 50 y.o.   MRN: [id number] Patient ID: Robin Welch, female   DOB: 26-Mar-1966, 50 y.o.   MRN: [id number] [facility] MD Progress Note  07/09/2016 4:06 PM Mykell Genao  MRN:  [id number] Subjective:  Very tense Principal Problem: Bipolar 1 disorder most recent episode depressed; substance use disorder cocaine Diagnosis:  Bipolar 1 disorder Today the patient is doing fairly well. But she hasn't been doing well over the last 2-3 weeks. She's been extremely anxious and threatened by her husband is about to begin in a divorce process. Her husband does not want to pay her any alimony. The patient is going to find this in court. This is very challenging for the patient given that her husband has always gotten his way life and controlling. This takes a great deal of stress and being brave and assertiveness. Her daughter seems to think that she can when in court for alimony given that she has disability from a chronic severe mental illness. The patient is diagnosed with bipolar disorder. She clearly has had significant episodes of disorder. At this time the patient is doing great terms of compliancy. She continues taking Seroquel's 400 mg 2 at night which is been very helpful by her report. She takes Geodon 80 mg 3 at dinner time. The patient had her Neurontin dose increased to taking a 600 mg she'll and initially shears self increased it to taking 6 pills twice a day. Today we adjusted to a more appropriate dose of 5 twice a day. Even this of course is a high dose but this patient is not oversedated at all. Today we'll go ahead and increase her BuSpar to taking 30 mg twice a day. Patient went to the emergency room and even try to request Xanax unfortunately they did see the notes and saw this patient has a problem with addiction. He did not give her anything and instead give her some Vistaril. Vistaril seems to help somewhat. She just started yesterday. Today the patient says her  anxiety is better. She's not asking for benzodiazepines now. The patient goes to NA every day. She has a sponsor who will go with her to court when it comes time for her husband wish to not take alimony. The patient is been absolutely clean for 11 months. She recently had a drug screen in the emergency room that was clean. The patient lives with her 86 year old son who unfortunately uses marijuana. 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 She likes it. Overall I think this patient has been irrational and being straightforward as best she can. I think she doing fairly well. She's not suicidal and she is not psychotic. I do not believe she is manic.  Past Medical History:  Past Medical History:  Diagnosis Date  . Bipolar 1 disorder [facility])    sees Dr. Candis Schatz psych [phone])  . COPD (chronic obstructive pulmonary disease) ([place]) 03/2013   hyperinflation by CXR  . History of cocaine dependence ([place]) latest 2017   undergoing detox  . History of migraines   . History of tension headache   . Hx: UTI (urinary tract infection)   . Positive PPD 1989   s/p CXR and INH 6 mo  . Seizure ([place])    from HA medicine?   Paulla Fore     Past Surgical History:  Procedure Laterality Date  . CHOLECYSTECTOMY  2006  . TUBAL LIGATION  2003   Family History:  Family History  Problem Relation Age of Onset  . Diabetes Mother     T2DM  . Breast cancer Mother 50  . Dementia Mother   . Coronary artery disease Father 51    MI  . Anxiety disorder Paternal Aunt   . Anxiety disorder Cousin   . Depression Cousin   . ADD / ADHD Child   . Anxiety disorder Child   . Depression Child   . Cancer Neg Hx   . Stroke Neg Hx    Family Psychiatric  History:  Social History:  History  Alcohol Use No     History  Drug Use No    Social History   Social History  . Marital status: Married    Spouse name: N/A  . Number of children: 2  . Years of education: bachelors   Occupational  History  . 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 Smoking status: Current Every Day Smoker    Packs/day: 2.00    Types: Cigarettes  . Smokeless tobacco: Never Used     Comment: started age 89yo, tried chantix in past  . Alcohol use No  . Drug use: No  . Sexual activity: Yes    Birth control/ protection: Surgical   Other Topics Concern  . Not on file   Social History Narrative   Caffeine: 40 oz diet mountain dew/day   Lives with husband and 2 children, no pets         Additional Social History:                         Sleep: Good  Appetite:  Good  Current Medications: Current Outpatient Prescriptions  Medication Sig Dispense Refill  . albuterol (PROVENTIL HFA;VENTOLIN HFA) 108 (90 Base) MCG/ACT inhaler Inhale 2 puffs into the lungs every 6 (six) hours as needed for wheezing or shortness of breath. 1 Inhaler 3  . busPIRone (BUSPAR) 15 MG tablet Take 2 tablets bid 120 tablet 3  . Fluticasone-Salmeterol (ADVAIR DISKUS) 250-50 MCG/DOSE AEPB Inhale 1 puff into the lungs 2 (two) times daily. (Patient not taking: Reported on 07/08/2016) 3 each 3  . gabapentin (NEURONTIN) 300 MG capsule 5  bid 300 capsule 4  . hydrOXYzine (ATARAX/VISTARIL) 25 MG tablet 1 1tid 90 tablet 4  . mirtazapine (REMERON) 30 MG tablet Take 1 tablet (30 mg total) by mouth at bedtime. (Patient not taking: Reported on 07/08/2016) 90 tablet 0  . oxyCODONE-acetaminophen (ROXICET) 5-325 MG tablet Take 1 tablet by mouth every 8 (eight) hours as needed for severe pain. (Patient not taking: Reported on 07/08/2016) 20 tablet 0  . QUEtiapine (SEROQUEL) 400 MG tablet Take 2 tablets (800 mg total) by mouth at bedtime. 2 qhs 60 tablet 5  . tiotropium (SPIRIVA HANDIHALER) 18 MCG inhalation capsule Place 1 capsule (18 mcg total) into inhaler and inhale at bedtime. (Patient not taking: Reported on 07/08/2016) 30 capsule 12  . topiramate (TOPAMAX)  25 MG tablet 1  tid (Patient not taking: Reported on 07/08/2016) 270 tablet 0  . ziprasidone (GEODON) 80 MG capsule Take 3 capsules (240 mg total) by mouth every evening. Take 3  capsules each night at dinner 90 capsule 4   No current facility-administered medications for this visit.     Lab Results:  Results for orders placed or performed during the hospital encounter of 07/08/16 (from the past 48 hour(s))  Comprehensive metabolic panel     Status: Abnormal   Collection Time: 07/08/16  2:09 PM  Result Value Ref Range   Sodium 132 (L) 135 - 145 mmol/L   Potassium 4.2 3.5 - 5.1 mmol/L   Chloride 99 (L) 101 - 111 mmol/L   CO2 26 22 - 32 mmol/L   Glucose, Bld 78 65 - 99 mg/dL   BUN <5 (L) 6 - 20 mg/dL   Creatinine, Ser 0.66 0.44 - 1.00 mg/dL   Calcium 8.8 (L) 8.9 - 10.3 mg/dL   Total Protein 6.5 6.5 - 8.1 g/dL   Albumin 3.5 3.5 - 5.0 g/dL   AST 19 15 - 41 U/L   ALT 14 14 - 54 U/L   Alkaline Phosphatase 77 38 - 126 U/L   Total Bilirubin 0.4 0.3 - 1.2 mg/dL   GFR calc non Af Amer >60 >60 mL/min   GFR calc Af Amer >60 >60 mL/min    Comment: (NOTE) The eGFR has been calculated using the CKD EPI equation. This calculation has not been validated in all clinical situations. eGFR's persistently <60 mL/min signify possible Chronic Kidney Disease.    Anion gap 7 5 - 15  Ethanol     Status: None   Collection Time: 07/08/16  2:09 PM  Result Value Ref Range   Alcohol, Ethyl (B) <5 <5 mg/dL    Comment:        LOWEST DETECTABLE LIMIT FOR SERUM ALCOHOL IS 5 mg/dL FOR MEDICAL PURPOSES ONLY   cbc     Status: Abnormal   Collection Time: 07/08/16  2:09 PM  Result Value Ref Range   WBC 6.6 4.0 - 10.5 K/uL   RBC 4.15 3.87 - 5.11 MIL/uL   Hemoglobin 11.8 (L) 12.0 - 15.0 g/dL   HCT 36.1 36.0 - 46.0 %   MCV 87.0 78.0 - 100.0 fL   MCH 28.4 26.0 - 34.0 pg   MCHC 32.7 30.0 - 36.0 g/dL   RDW 13.4 11.5 - 15.5 %   Platelets 455 (H) 150 - 400 K/uL  Rapid urine drug screen (hospital performed)      Status: None   Collection Time: 07/08/16  2:31 PM  Result Value Ref Range   Opiates NONE DETECTED NONE DETECTED   Cocaine NONE DETECTED NONE DETECTED   Benzodiazepines NONE DETECTED NONE DETECTED   Amphetamines NONE DETECTED NONE DETECTED   Tetrahydrocannabinol NONE DETECTED NONE DETECTED   Barbiturates NONE DETECTED NONE DETECTED    Comment:        DRUG SCREEN FOR MEDICAL PURPOSES ONLY.  IF CONFIRMATION IS NEEDED FOR ANY PURPOSE, NOTIFY LAB WITHIN 5 DAYS.        LOWEST DETECTABLE LIMITS FOR URINE DRUG SCREEN Drug Class       Cutoff (ng/mL) Amphetamine      1000 Barbiturate      200 Benzodiazepine   438 Tricyclics       381 Opiates          300 Cocaine          300 THC              50     Blood Alcohol level:  Lab Results  Component Value Date   ETH <5 07/08/2016    Physical Findings: AIMS:  , ,  ,  ,    CIWA:    COWS:     Musculoskeletal: Strength & Muscle Tone: within normal limits Gait & Station: normal Patient leans: N/A  Psychiatric Specialty Exam: ROS  Blood pressure 110/64, pulse (!) 104, height '5\' 2"'  (1.575 m), weight 107 lb (48.5 kg), last menstrual period 01/04/2015.Body mass index is 19.57 kg/m.  General Appearance: Disheveled  Eye Sport and exercise psychologist::  Fair  Speech:  Pressured  Volume:  Increased  Mood:  Anxious  Affect:  Non-Congruent  Thought Process:  Coherent  Orientation:  Full (Time, Place, and Person)  Thought Content:  WDL  Suicidal Thoughts:  No  Homicidal Thoughts:  No  Memory:  NA  Judgement:  Fair  Insight:  Fair  Psychomotor Activity:  Increased  Concentration:  Good  Recall:  Good  Fund of Knowledge:Good  Language: Fair  Akathisia:  No  Handed:  Right  AIMS (if indicated):     Assets: Desire to improve   ADL's:  Intact  Cognition: WNL  Sleep:      Treatment Plan Summary: 07/09/2016, 4:06 PM  at this time given that she thinks she is lost weight Remeron shears self stopped it and that is reasonable. In reality I doubt she lost  weight 1 Remeron as usually it makes her gain weight. Nonetheless we'll give her some control in her treatment by discontinuing officially her Remeron but continue her Seroquel 400 mg 2 at night, Geodon 80 mg 3 at suppertime Neurontin and adjust the dose of 500 mg twice a day and continue BuSpar at a slightly higher dose of 30 mg twice a day. Today the patient continue going to NA have sponsor. Today the patient will be referred to [facility] for therapy. She'll return to see me in 2 and half months.

## 2016-07-20 ENCOUNTER — Ambulatory Visit (HOSPITAL_COMMUNITY): Payer: Self-pay | Admitting: Licensed Clinical Social Worker

## 2016-07-27 NOTE — Telephone Encounter (Addendum)
Ongoing struggle. On 07/02/2016 I sent in albuterol (ventolin or proventil) with instructions to pharmacist to fill according to pt's formulary given this change. Can we call pharmacy to verify they have whatever they need? Thanks.  Ok to interchange albuterol inhalers according to insurance formulary

## 2016-07-27 NOTE — Telephone Encounter (Signed)
Per pharmacy, pt p/u ProAir on 07/01/16 and it is covered by her Insurance; they have note on Albuterol HFA Rx to dispense ProAir, it is early for her to p/u now/SLS 11/27

## 2016-07-27 NOTE — Telephone Encounter (Signed)
Pt left v/m requesting cb about ins coverage for inhaler.pt thinks albuterol is covered. Left v/m requesting pt to cb.[facility].

## 2016-07-30 MED ORDER — ALBUTEROL SULFATE (2.5 MG/3ML) 0.083% IN NEBU
2.5000 mg | INHALATION_SOLUTION | Freq: Four times a day (QID) | RESPIRATORY_TRACT | 6 refills | Status: DC | PRN
Start: 2016-07-30 — End: 2016-07-30

## 2016-07-30 MED ORDER — ALBUTEROL SULFATE HFA 108 (90 BASE) MCG/ACT IN AERS: 2.0000 | INHALATION_SPRAY | Freq: Four times a day (QID) | RESPIRATORY_TRACT | 3 refills | Status: DC | PRN

## 2016-07-30 NOTE — Telephone Encounter (Signed)
Pt said that she cannot afford the $67.00 copay for Proair. I spoke with Amy at [facility] and she said the generic albuterol is no longer the least expensive inhaler; Amy checked and the ventolin inhaler is the most affordable inhaler with copay of $47.00. Pt said she cannot afford that and is out of inhaler and what can she take that is not so expensive. Pt request cb. [facility].

## 2016-07-30 NOTE — Telephone Encounter (Signed)
Spoke with patient. She is going to pay the $47 and not get the nebulizer. She will call back if she changes her mind later on.

## 2016-07-30 NOTE — Addendum Note (Signed)
Addended by: Ria Bush on: 07/30/2016 10:11 AM   Modules accepted: Orders

## 2016-07-30 NOTE — Telephone Encounter (Signed)
Pt left v/m if cannot get anything any cheaper pt will try to get the $47.00 medication; pt request cb if anything else could be worked out ASAP.

## 2016-07-30 NOTE — Addendum Note (Signed)
Addended by: Royann Shivers A on: 07/30/2016 04:46 PM   Modules accepted: Orders

## 2016-07-30 NOTE — Telephone Encounter (Signed)
Price out albuterol neb sent to pharmacy. May need to buy machine as well though.

## 2016-08-05 ENCOUNTER — Ambulatory Visit (HOSPITAL_COMMUNITY): Payer: Self-pay | Admitting: Psychiatry

## 2016-08-11 ENCOUNTER — Telehealth: Payer: Self-pay

## 2016-08-11 NOTE — Telephone Encounter (Signed)
Pt left v/m requesting PA for proventil inhaler. I spoke with [facility] and was told no PA needed. I advised pt and she got a letter from ins co that the next time she request proventil inhaler a PA will be needed and wanted Maudie Mercury CMA to know. FYI to [employer]. Nothing needed today.

## 2016-08-11 NOTE — Telephone Encounter (Signed)
Last conversation with patient, she was going to pay $47 for ventolin. No other cheaper alternative for her other than a nebulizer, but she didn't want to buy a machine. I believe the letter has crossed paths with our last conversation.

## 2016-08-12 ENCOUNTER — Other Ambulatory Visit: Payer: Self-pay

## 2016-08-12 ENCOUNTER — Telehealth: Payer: Self-pay | Admitting: *Deleted

## 2016-08-12 MED ORDER — VARENICLINE TARTRATE 0.5 MG X 11 & 1 MG X 42 PO MISC: ORAL | 0 refills | Status: DC

## 2016-08-12 NOTE — Telephone Encounter (Signed)
Please call in the starter pack  When she is almost done with that- call back so we can send in the regular mt dose Good luck quitting! If side eff or problems let us know- esp if mood changes   Will cc to PCP so he is aware

## 2016-08-12 NOTE — Telephone Encounter (Signed)
Patient left a voicemail requesting a script for Chantix. Patient stated that Dr. Darnell Level has done this for her in the past. Patient stated that she is having to use inhalers and they are expensive so she needs to quit smoking. Pharmacy [facility]

## 2016-08-14 NOTE — Telephone Encounter (Signed)
Rx called in as prescribed, pt notified Rx sent and advise of Dr. Marliss Coots comments/instructions and verbalized understanding

## 2016-08-27 ENCOUNTER — Ambulatory Visit (HOSPITAL_COMMUNITY): Payer: Self-pay | Admitting: Licensed Clinical Social Worker

## 2016-08-27 ENCOUNTER — Telehealth (HOSPITAL_COMMUNITY): Payer: Self-pay

## 2016-08-27 NOTE — Telephone Encounter (Signed)
Fax received this morning from [place] was approved, approval good until 08/26/2017. The Gabapentin was denied, I have already sent an appeal. I called the patient to let her know and I called the pharmacy to make them aware as well.

## 2016-08-28 ENCOUNTER — Other Ambulatory Visit (HOSPITAL_COMMUNITY): Payer: Self-pay

## 2016-08-28 MED ORDER — BUSPIRONE HCL 15 MG PO TABS: ORAL_TABLET | ORAL | 3 refills | Status: DC

## 2016-08-28 MED ORDER — HYDROXYZINE HCL 25 MG PO TABS: ORAL_TABLET | ORAL | 3 refills | Status: DC

## 2016-09-08 ENCOUNTER — Ambulatory Visit (HOSPITAL_COMMUNITY): Payer: Self-pay | Admitting: Licensed Clinical Social Worker

## 2016-09-20 DIAGNOSIS — S9032XA Contusion of left foot, initial encounter: Secondary | ICD-10-CM | POA: Diagnosis not present

## 2016-09-30 ENCOUNTER — Ambulatory Visit (INDEPENDENT_AMBULATORY_CARE_PROVIDER_SITE_OTHER): Payer: Commercial Managed Care - HMO | Admitting: Psychiatry

## 2016-09-30 ENCOUNTER — Encounter (HOSPITAL_COMMUNITY): Payer: Self-pay | Admitting: Psychiatry

## 2016-09-30 ENCOUNTER — Other Ambulatory Visit (HOSPITAL_COMMUNITY): Payer: Self-pay

## 2016-09-30 VITALS — BP 102/58 | HR 90 | Resp 99 | Ht 62.0 in | Wt 108.8 lb

## 2016-09-30 DIAGNOSIS — Z79891 Long term (current) use of opiate analgesic: Secondary | ICD-10-CM

## 2016-09-30 DIAGNOSIS — Z833 Family history of diabetes mellitus: Secondary | ICD-10-CM | POA: Diagnosis not present

## 2016-09-30 DIAGNOSIS — F3160 Bipolar disorder, current episode mixed, unspecified: Secondary | ICD-10-CM

## 2016-09-30 DIAGNOSIS — Z79899 Other long term (current) drug therapy: Secondary | ICD-10-CM

## 2016-09-30 DIAGNOSIS — F3162 Bipolar disorder, current episode mixed, moderate: Secondary | ICD-10-CM

## 2016-09-30 DIAGNOSIS — Z888 Allergy status to other drugs, medicaments and biological substances status: Secondary | ICD-10-CM

## 2016-09-30 DIAGNOSIS — Z803 Family history of malignant neoplasm of breast: Secondary | ICD-10-CM

## 2016-09-30 DIAGNOSIS — F3130 Bipolar disorder, current episode depressed, mild or moderate severity, unspecified: Secondary | ICD-10-CM

## 2016-09-30 DIAGNOSIS — Z810 Family history of intellectual disabilities: Secondary | ICD-10-CM

## 2016-09-30 DIAGNOSIS — F17210 Nicotine dependence, cigarettes, uncomplicated: Secondary | ICD-10-CM

## 2016-09-30 DIAGNOSIS — Z9851 Tubal ligation status: Secondary | ICD-10-CM

## 2016-09-30 DIAGNOSIS — Z9049 Acquired absence of other specified parts of digestive tract: Secondary | ICD-10-CM | POA: Diagnosis not present

## 2016-09-30 MED ORDER — GABAPENTIN 300 MG PO CAPS: ORAL_CAPSULE | ORAL | 5 refills | Status: DC

## 2016-09-30 MED ORDER — ZIPRASIDONE HCL 80 MG PO CAPS: 240.0000 mg | ORAL_CAPSULE | Freq: Every evening | ORAL | 5 refills | Status: DC

## 2016-09-30 MED ORDER — BUSPIRONE HCL 15 MG PO TABS: ORAL_TABLET | ORAL | 3 refills | Status: DC

## 2016-09-30 MED ORDER — HYDROXYZINE HCL 25 MG PO TABS: ORAL_TABLET | ORAL | 1 refills | Status: DC

## 2016-09-30 MED ORDER — QUETIAPINE FUMARATE 400 MG PO TABS: 800.0000 mg | ORAL_TABLET | Freq: Every day | ORAL | 1 refills | Status: DC

## 2016-09-30 NOTE — Progress Notes (Signed)
Patient ID: Robin Welch, female   DOB: 1965-11-24, 51 y.o.   MRN: [id number] Patient ID: Robin Welch, female   DOB: November 09, 1965, 51 y.o.   MRN: [id number] [facility] MD Progress Note  09/30/2016 4:35 PM Robin Welch  MRN:  [id number] Subjective:  Very tense Principal Problem: Bipolar 1 disorder most recent episode depressed; substance use disorder cocaine Diagnosis:  Bipolar 1 disorder  Today the patient is doing great. She's well-dressed. She is appropriate she smiles. She's not nearly as stated in the past. She's more appropriate. The patient denies being depressed. She sleeping and eating well. Patient is 1 year clean. She has a nearly every day. 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 The patient is still in conflict with her husband is going through a divorce process. Return to [employer]. The patient is young son is starting college next year. He's doing great. Her other son is 44 and lives with her smoke marijuana regularly. He certainly is a risk for herself right. Nonetheless the patient takes her medicines just as prescribed. She is sleeping and eating very well. She is active. She shows no evidence of psychosis. She has not anhedonic. She's not irritable. She shows no evidence of mania. The patient is well-organized focus and is functioning very well. She's not suicidal. Medically she is very stable.  Past Medical History:  Past Medical History:  Diagnosis Date  . Bipolar 1 disorder [facility])    sees Dr. Candis Schatz psych [phone])  . COPD (chronic obstructive pulmonary disease) ([place]) 03/2013   hyperinflation by CXR  . History of cocaine dependence ([place]) latest 2017   undergoing detox  . History of migraines   . History of tension headache   . Hx: UTI (urinary tract infection)   . Positive PPD 1989   s/p CXR and INH 6 mo  . Seizure ([place])    from HA medicine?   Paulla Fore     Past Surgical History:  Procedure Laterality Date  . CHOLECYSTECTOMY  2006  . TUBAL LIGATION  2003    Family History:  Family History  Problem Relation Age of Onset  . Diabetes Mother     T2DM  . Breast cancer Mother 9  . Dementia Mother   . Coronary artery disease Father 4    MI  . Anxiety disorder Paternal Aunt   . Anxiety disorder Cousin   . Depression Cousin   . ADD / ADHD Child   . Anxiety disorder Child   . Depression Child   . Cancer Neg Hx   . Stroke Neg Hx    Family Psychiatric  History:  Social History:  History  Alcohol Use No     History  Drug Use No    Social History   Social History  . Marital status: Divorced    Spouse name: N/A  . Number of children: 2  . Years of education: bachelors   Occupational History  . 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 Smoking status: Current Every Day Smoker    Packs/day: 2.00    Years: 36.00    Types: Cigarettes  . Smokeless tobacco: Never Used     Comment: Would like Chantix but cannot afford  . Alcohol use No  . Drug use: No  . Sexual activity: Not Currently    Birth control/ protection: Surgical   Other Topics Concern  . None   Social  History Narrative   Caffeine: 40 oz diet mountain dew/day   Lives with husband and 2 children, no pets         Additional Social History:                         Sleep: Good  Appetite:  Good  Current Medications: Current Outpatient Prescriptions  Medication Sig Dispense Refill  . busPIRone (BUSPAR) 15 MG tablet Take 2 po qam and 3 po qhs 150 tablet 3  . gabapentin (NEURONTIN) 300 MG capsule 5  bid 300 capsule 4  . hydrOXYzine (ATARAX/VISTARIL) 25 MG tablet 2 po am, 2 po mid day, 1 po qhs 150 tablet 3  . QUEtiapine (SEROQUEL) 400 MG tablet Take 2 tablets (800 mg total) by mouth at bedtime. 2 qhs 60 tablet 5  . ziprasidone (GEODON) 80 MG capsule Take 3 capsules (240 mg total) by mouth every evening. Take 3  capsules each night at dinner 90 capsule 4  . albuterol (PROVENTIL  HFA;VENTOLIN HFA) 108 (90 Base) MCG/ACT inhaler Inhale 2 puffs into the lungs every 6 (six) hours as needed for wheezing or shortness of breath. (Patient not taking: Reported on 09/30/2016) 1 Inhaler 3  . Fluticasone-Salmeterol (ADVAIR DISKUS) 250-50 MCG/DOSE AEPB Inhale 1 puff into the lungs 2 (two) times daily. (Patient not taking: Reported on 07/08/2016) 3 each 3  . mirtazapine (REMERON) 30 MG tablet Take 1 tablet (30 mg total) by mouth at bedtime. (Patient not taking: Reported on 07/08/2016) 90 tablet 0  . oxyCODONE-acetaminophen (ROXICET) 5-325 MG tablet Take 1 tablet by mouth every 8 (eight) hours as needed for severe pain. (Patient not taking: Reported on 07/08/2016) 20 tablet 0  . tiotropium (SPIRIVA HANDIHALER) 18 MCG inhalation capsule Place 1 capsule (18 mcg total) into inhaler and inhale at bedtime. (Patient not taking: Reported on 07/08/2016) 30 capsule 12  . topiramate (TOPAMAX) 25 MG tablet 1  tid (Patient not taking: Reported on 07/08/2016) 270 tablet 0  . varenicline (CHANTIX PAK) 0.5 MG X 11 & 1 MG X 42 tablet Take one 0.5 mg tablet by mouth once daily for 3 days, then increase to one 0.5 mg tablet twice daily for 4 days, then increase to one 1 mg tablet twice daily. (Patient not taking: Reported on 09/30/2016) 53 tablet 0   No current facility-administered medications for this visit.     Lab Results:  No results found for this or any previous visit (from the past 48 hour(s)).  Blood Alcohol level:  Lab Results  Component Value Date   ETH <5 07/08/2016    Physical Findings: AIMS:  , ,  ,  ,    CIWA:    COWS:     Musculoskeletal: Strength & Muscle Tone: within normal limits Gait & Station: normal Patient leans: N/A  Psychiatric Specialty Exam: ROS  Blood pressure (!) 102/58, pulse 90, resp. rate (!) 99, height 5\' 2"  (1.575 m), weight 108 lb 12.8 oz (49.4 kg), last menstrual period 01/04/2015.Body mass index is 19.9 kg/m.  General Appearance: Disheveled  Eye Sport and exercise psychologist::   Fair  Speech:  Pressured  Volume:  Increased  Mood:  Anxious  Affect:  Non-Congruent  Thought Process:  Coherent  Orientation:  Full (Time, Place, and Person)  Thought Content:  WDL  Suicidal Thoughts:  No  Homicidal Thoughts:  No  Memory:  NA  Judgement:  Fair  Insight:  Fair  Psychomotor Activity:  Increased  Concentration:  Good  Recall:  Roel Cluck of Knowledge:Good  Language: Fair  Akathisia:  No  Handed:  Right  AIMS (if indicated):     Assets: Desire to improve   ADL's:  Intact  Cognition: WNL  Sleep:      Treatment Plan Summary: 09/30/2016, 4:35 PM At this time the patient has chosen not to go into individual therapy. She simply cannot afford it. She is on multiple medications. Her insurance changing a lot. The patient is managing well. She continues Seroquel 400 mg 2 at night, Geodon 80 mg 3 pills at dinner time, Neurontin 600 mg 5 twice a day, BuSpar 30 mg twice a day and Vistaril in the mornings. With this regime she seems to do well. She's good insight. She knows she is anxious and she tries to use different deal. She's had urgency use but is unable to block. He uses NA very well. She'll return to see me in 3 or 4 months. Patient was instructed to call if there are any problems. She is very stable at this time.

## 2016-10-13 ENCOUNTER — Other Ambulatory Visit (HOSPITAL_COMMUNITY): Payer: Self-pay | Admitting: Psychiatry

## 2016-10-13 ENCOUNTER — Other Ambulatory Visit (HOSPITAL_COMMUNITY): Payer: Self-pay

## 2016-10-13 DIAGNOSIS — F3130 Bipolar disorder, current episode depressed, mild or moderate severity, unspecified: Secondary | ICD-10-CM

## 2016-10-13 MED ORDER — HYDROXYZINE HCL 25 MG PO TABS: ORAL_TABLET | ORAL | 1 refills | Status: DC

## 2016-11-03 ENCOUNTER — Telehealth: Payer: Self-pay | Admitting: Family Medicine

## 2016-11-03 NOTE — Telephone Encounter (Signed)
Pt declined to schedule AWV at this time °

## 2016-12-11 ENCOUNTER — Telehealth (HOSPITAL_COMMUNITY): Payer: Self-pay

## 2016-12-11 DIAGNOSIS — F3130 Bipolar disorder, current episode depressed, mild or moderate severity, unspecified: Secondary | ICD-10-CM

## 2016-12-11 NOTE — Telephone Encounter (Signed)
Patient is calling patient wants to know if you can increase her Gabapentin to 3 times a day and her Buspar 3 times a day - she said her anxiety is really bad and she does not know what else to do. Please review and advise, thank you

## 2016-12-18 MED ORDER — HYDROXYZINE HCL 50 MG PO TABS: 50.0000 mg | ORAL_TABLET | Freq: Two times a day (BID) | ORAL | 0 refills | Status: DC | PRN

## 2016-12-18 NOTE — Telephone Encounter (Signed)
Per Dr. Casimiro Needle I sent an order for 50 mg Vistaril to patients pharmacy, I called patient and let her know - she is agreeable but does not know if it will work on her anxiety. I told patient to try it and call me on Wednesday next week if she needs something different.

## 2016-12-21 ENCOUNTER — Other Ambulatory Visit (HOSPITAL_COMMUNITY): Payer: Self-pay

## 2016-12-21 MED ORDER — HYDROXYZINE HCL 50 MG PO TABS: 50.0000 mg | ORAL_TABLET | Freq: Two times a day (BID) | ORAL | 0 refills | Status: DC | PRN

## 2016-12-21 NOTE — Progress Notes (Signed)
Patient called and asked that her vistaril be sent to [facility] mail order as it is free through them. I sent the order and called her previous pharmacy to discontinue previous order.

## 2017-01-05 ENCOUNTER — Ambulatory Visit (INDEPENDENT_AMBULATORY_CARE_PROVIDER_SITE_OTHER): Payer: Commercial Managed Care - HMO | Admitting: Licensed Clinical Social Worker

## 2017-01-05 ENCOUNTER — Encounter (HOSPITAL_COMMUNITY): Payer: Self-pay | Admitting: Licensed Clinical Social Worker

## 2017-01-05 DIAGNOSIS — F3130 Bipolar disorder, current episode depressed, mild or moderate severity, unspecified: Secondary | ICD-10-CM | POA: Diagnosis not present
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
## 2017-01-07 ENCOUNTER — Other Ambulatory Visit (HOSPITAL_COMMUNITY): Payer: Self-pay

## 2017-01-07 MED ORDER — BENZTROPINE MESYLATE 1 MG PO TABS: 1.0000 mg | ORAL_TABLET | Freq: Every day | ORAL | 0 refills | Status: DC

## 2017-01-11 ENCOUNTER — Other Ambulatory Visit (HOSPITAL_COMMUNITY): Payer: Self-pay

## 2017-01-11 MED ORDER — HYDROXYZINE HCL 50 MG PO TABS: 50.0000 mg | ORAL_TABLET | Freq: Four times a day (QID) | ORAL | 0 refills | Status: DC | PRN

## 2017-01-15 ENCOUNTER — Other Ambulatory Visit (HOSPITAL_COMMUNITY): Payer: Self-pay

## 2017-01-19 DIAGNOSIS — F310 Bipolar disorder, current episode hypomanic: Secondary | ICD-10-CM | POA: Diagnosis not present

## 2017-01-27 ENCOUNTER — Ambulatory Visit (HOSPITAL_COMMUNITY): Payer: Self-pay | Admitting: Licensed Clinical Social Worker

## 2017-02-03 ENCOUNTER — Ambulatory Visit (HOSPITAL_COMMUNITY): Payer: Self-pay | Admitting: Psychiatry

## 2017-02-15 DIAGNOSIS — F3175 Bipolar disorder, in partial remission, most recent episode depressed: Secondary | ICD-10-CM | POA: Diagnosis not present

## 2017-02-17 ENCOUNTER — Ambulatory Visit (HOSPITAL_COMMUNITY): Payer: Self-pay | Admitting: Psychiatry

## 2017-03-01 ENCOUNTER — Ambulatory Visit (INDEPENDENT_AMBULATORY_CARE_PROVIDER_SITE_OTHER): Payer: Medicare HMO | Admitting: Family Medicine

## 2017-03-01 ENCOUNTER — Encounter: Payer: Self-pay | Admitting: Family Medicine

## 2017-03-01 VITALS — BP 104/60 | HR 95 | Temp 97.8°F | Wt 106.0 lb

## 2017-03-01 DIAGNOSIS — J449 Chronic obstructive pulmonary disease, unspecified: Secondary | ICD-10-CM

## 2017-03-01 DIAGNOSIS — F319 Bipolar disorder, unspecified: Secondary | ICD-10-CM | POA: Diagnosis not present

## 2017-03-01 DIAGNOSIS — Z720 Tobacco use: Secondary | ICD-10-CM | POA: Diagnosis not present

## 2017-03-01 DIAGNOSIS — R112 Nausea with vomiting, unspecified: Secondary | ICD-10-CM | POA: Diagnosis not present

## 2017-03-01 DIAGNOSIS — Z1211 Encounter for screening for malignant neoplasm of colon: Secondary | ICD-10-CM | POA: Diagnosis not present

## 2017-03-01 LAB — COMPREHENSIVE METABOLIC PANEL
ALBUMIN: 3.6 g/dL (ref 3.5–5.2)
ALK PHOS: 102 U/L (ref 39–117)
ALT: 29 U/L (ref 0–35)
AST: 20 U/L (ref 0–37)
BILIRUBIN TOTAL: 0.3 mg/dL (ref 0.2–1.2)
BUN: 5 mg/dL — ABNORMAL LOW (ref 6–23)
CALCIUM: 8.5 mg/dL (ref 8.4–10.5)
CHLORIDE: 96 meq/L (ref 96–112)
CO2: 29 mEq/L (ref 19–32)
CREATININE: 0.6 mg/dL (ref 0.40–1.20)
GFR: 110.94 mL/min (ref >60.00)
Glucose, Bld: 83 mg/dL (ref 70–99)
Potassium: 4.1 mEq/L (ref 3.5–5.1)
Sodium: 129 mEq/L — ABNORMAL LOW (ref 135–145)
Total Protein: 6.4 g/dL (ref 6.0–8.3)

## 2017-03-01 LAB — CBC WITH DIFFERENTIAL/PLATELET
BASOS ABS: 0.1 10*3/uL (ref 0.0–0.1)
Basophils Relative: 0.7 % (ref 0.0–3.0)
EOS ABS: 0.1 10*3/uL (ref 0.0–0.7)
Eosinophils Relative: 1.7 % (ref 0.0–5.0)
HCT: 31.8 % — ABNORMAL LOW (ref 36.0–46.0)
Hemoglobin: 10.5 g/dL — ABNORMAL LOW (ref 12.0–15.0)
Lymphocytes Relative: 22.8 % (ref 12.0–46.0)
Lymphs Abs: 1.9 10*3/uL (ref 0.7–4.0)
MCHC: 33.1 g/dL (ref 30.0–36.0)
MCV: 81.9 fl (ref 78.0–100.0)
MONO ABS: 1 10*3/uL (ref 0.1–1.0)
Monocytes Relative: 11.7 % (ref 3.0–12.0)
Neutro Abs: 5.3 10*3/uL (ref 1.4–7.7)
Neutrophils Relative %: 63.1 % (ref 43.0–77.0)
Platelets: 535 10*3/uL — ABNORMAL HIGH (ref 150.0–400.0)
RBC: 3.88 Mil/uL (ref 3.87–5.11)
RDW: 16.2 % — ABNORMAL HIGH (ref 11.5–15.5)
WBC: 8.4 10*3/uL (ref 4.0–10.5)

## 2017-03-01 LAB — TSH: TSH: 1.35 u[IU]/mL (ref 0.35–4.50)

## 2017-03-01 LAB — LIPASE: LIPASE: 28 U/L (ref 11.0–59.0)

## 2017-03-01 MED ORDER — TIOTROPIUM BROMIDE MONOHYDRATE 18 MCG IN CAPS: 18.0000 ug | ORAL_CAPSULE | Freq: Every day | RESPIRATORY_TRACT | 11 refills | Status: DC

## 2017-03-01 MED ORDER — ONDANSETRON HCL 4 MG PO TABS: 4.0000 mg | ORAL_TABLET | Freq: Three times a day (TID) | ORAL | 0 refills | Status: DC | PRN

## 2017-03-01 NOTE — Patient Instructions (Addendum)
Labs today See Robin Welch to schedule abdominal ultrasound.  zofran for nausea as needed.  We will refer you for colonoscopy as you're due - for colon cancer screening.  Return for spirometry at your convenience.  Return at your convenience for physical.

## 2017-03-01 NOTE — Progress Notes (Signed)
BP 104/60 (BP Location: Left Arm, Patient Position: Sitting, Cuff Size: Normal)   Pulse 95   Temp 97.8 F (36.6 C) (Oral)   Wt 106 lb (48.1 kg)   LMP 01/04/2015   SpO2 98%   BMI 19.39 kg/m    CC: memory loss, GI illness Subjective:    Patient ID: Robin Welch, female    DOB: 1966/04/11, 51 y.o.   MRN: [id number]  HPI: Robin Welch is a 51 y.o. female presenting on 03/01/2017 for Memory Loss (Cut back gabapentin and her memory loss is better) and Illness (Says she contracted the same virus her son had a few weeks ago. She has nausea, vomiting, and malaise.)   I last saw patient 08/2015.  3 wk h/o nausea and daily vomiting x1. NBNB emesis. Denies fevers/chills, abd pain, cramping, indigestion, diarrhea or constipation, blood in stool. + early satiety. No dysphagia. Illness did seem to start after son was ill with GI bug. Denies respiratory symptoms. Only has one bowel movement per week - chronically like this.   Son was ill several weeks ago with GI illness.   COPD - not on medication for this. Spiriva was too expensive ($80).   Denies alcohol or recreational drugs Smoking 2ppd.  Chantix was unaffordable.  Bipolar 1 - sees Anderson Malta NP at [facility].   S/p cholecystectomy  Actually cutting down on gabapentin has helped with memory loss.   Relevant past medical, surgical, family and social history reviewed and updated as indicated. Interim medical history since our last visit reviewed. Allergies and medications reviewed and updated. Outpatient Medications Prior to Visit  Medication Sig Dispense Refill  . busPIRone (BUSPAR) 15 MG tablet Take 2 po qam and 3 po qhs (Patient taking differently: 30 mg 2 (two) times daily. Take 2 po qam and 3 po qhs) 450 tablet 3  . hydrOXYzine (ATARAX/VISTARIL) 50 MG tablet Take 1 tablet (50 mg total) by mouth every 6 (six) hours as needed. 120 tablet 0  . oxyCODONE-acetaminophen (ROXICET) 5-325 MG tablet Take 1 tablet by mouth every 8 (eight)  hours as needed for severe pain. 20 tablet 0  . QUEtiapine (SEROQUEL) 400 MG tablet Take 2 tablets (800 mg total) by mouth at bedtime. 2 qhs 180 tablet 1  . ziprasidone (GEODON) 80 MG capsule Take 3 capsules (240 mg total) by mouth every evening. Take 3  capsules each night at dinner (Patient taking differently: Take 160 mg by mouth every evening. Take 3  capsules each night at dinner) 90 capsule 5  . albuterol (PROVENTIL HFA;VENTOLIN HFA) 108 (90 Base) MCG/ACT inhaler Inhale 2 puffs into the lungs every 6 (six) hours as needed for wheezing or shortness of breath. 1 Inhaler 3  . benztropine (COGENTIN) 1 MG tablet Take 1 tablet (1 mg total) by mouth daily. 30 tablet 0  . Fluticasone-Salmeterol (ADVAIR DISKUS) 250-50 MCG/DOSE AEPB Inhale 1 puff into the lungs 2 (two) times daily. 3 each 3  . gabapentin (NEURONTIN) 300 MG capsule 5  bid 300 capsule 5  . varenicline (CHANTIX PAK) 0.5 MG X 11 & 1 MG X 42 tablet Take one 0.5 mg tablet by mouth once daily for 3 days, then increase to one 0.5 mg tablet twice daily for 4 days, then increase to one 1 mg tablet twice daily. 53 tablet 0  . tiotropium (SPIRIVA HANDIHALER) 18 MCG inhalation capsule Place 1 capsule (18 mcg total) into inhaler and inhale at bedtime. (Patient not taking: Reported on 03/01/2017) 30 capsule 12  No facility-administered medications prior to visit.      Per HPI unless specifically indicated in ROS section below Review of Systems     Objective:    BP 104/60 (BP Location: Left Arm, Patient Position: Sitting, Cuff Size: Normal)   Pulse 95   Temp 97.8 F (36.6 C) (Oral)   Wt 106 lb (48.1 kg)   LMP 01/04/2015   SpO2 98%   BMI 19.39 kg/m   Wt Readings from Last 3 Encounters:  03/01/17 106 lb (48.1 kg)  07/08/16 106 lb (48.1 kg)  08/23/15 92 lb (41.7 kg)    Physical Exam  Constitutional: She appears well-developed. No distress.  thin  HENT:  Head: Normocephalic and atraumatic.  Mouth/Throat: Oropharynx is clear and moist.  No oropharyngeal exudate.  Eyes: Conjunctivae are normal. Pupils are equal, round, and reactive to light.  Neck: Normal range of motion. Neck supple. No thyromegaly present.  Cardiovascular: Normal rate, regular rhythm, normal heart sounds and intact distal pulses.   No murmur heard. Pulmonary/Chest: Effort normal and breath sounds normal. No respiratory distress. She has no wheezes. She has no rales.  Musculoskeletal: She exhibits no edema.  Lymphadenopathy:    She has no cervical adenopathy.  Skin: Skin is warm and dry. No rash noted.  Psychiatric:  Rapid speech Expansive affect  Nursing note and vitals reviewed.  Results for orders placed or performed during the hospital encounter of 07/08/16  Comprehensive metabolic panel  Result Value Ref Range   Sodium 132 (L) 135 - 145 mmol/L   Potassium 4.2 3.5 - 5.1 mmol/L   Chloride 99 (L) 101 - 111 mmol/L   CO2 26 22 - 32 mmol/L   Glucose, Bld 78 65 - 99 mg/dL   BUN <5 (L) 6 - 20 mg/dL   Creatinine, Ser 0.66 0.44 - 1.00 mg/dL   Calcium 8.8 (L) 8.9 - 10.3 mg/dL   Total Protein 6.5 6.5 - 8.1 g/dL   Albumin 3.5 3.5 - 5.0 g/dL   AST 19 15 - 41 U/L   ALT 14 14 - 54 U/L   Alkaline Phosphatase 77 38 - 126 U/L   Total Bilirubin 0.4 0.3 - 1.2 mg/dL   GFR calc non Af Amer >60 >60 mL/min   GFR calc Af Amer >60 >60 mL/min   Anion gap 7 5 - 15  Ethanol  Result Value Ref Range   Alcohol, Ethyl (B) <5 <5 mg/dL  cbc  Result Value Ref Range   WBC 6.6 4.0 - 10.5 K/uL   RBC 4.15 3.87 - 5.11 MIL/uL   Hemoglobin 11.8 (L) 12.0 - 15.0 g/dL   HCT 36.1 36.0 - 46.0 %   MCV 87.0 78.0 - 100.0 fL   MCH 28.4 26.0 - 34.0 pg   MCHC 32.7 30.0 - 36.0 g/dL   RDW 13.4 11.5 - 15.5 %   Platelets 455 (H) 150 - 400 K/uL  Rapid urine drug screen (hospital performed)  Result Value Ref Range   Opiates NONE DETECTED NONE DETECTED   Cocaine NONE DETECTED NONE DETECTED   Benzodiazepines NONE DETECTED NONE DETECTED   Amphetamines NONE DETECTED NONE DETECTED    Tetrahydrocannabinol NONE DETECTED NONE DETECTED   Barbiturates NONE DETECTED NONE DETECTED      Assessment & Plan:  Patient agrees to GI referral for colonoscopy.   Problem List Items Addressed This Visit    Bipolar 1 disorder [facility])    Continue psychiatry care - sees Anderson [facility] NP at [facility]  COPD (chronic obstructive pulmonary disease) with chronic bronchitis ([place])    Ongoing smoker. Refilled spiriva. Encouraged she return for spirometry       Relevant Medications   tiotropium (SPIRIVA HANDIHALER) 18 MCG inhalation capsule   Non-intractable vomiting with nausea - Primary    3 wk h/o nausea and vomiting associated with some early satiety in setting of son's recent presumed viral illness. No dysphagia, no blood in stool or in emesis, denies significant GERD. In long term smoker, will need further evalution. Start with labs (CBC, CMP, lipase, TSH) and abd [place]. Low threshold for CT imaging.       Relevant Orders   Comprehensive metabolic panel   TSH   CBC with Differential/Platelet   Lipase   US Abdomen Complete   Tobacco abuse    2 ppd smoker. 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 Difficulty quitting. chantix was too expensive. Encouraged ongoing attempts at smoking cessation.       Other Visit Diagnoses    Special screening for malignant neoplasms, colon       Relevant Orders   Ambulatory referral to Gastroenterology       Follow up plan: Return if symptoms worsen or fail to improve.  Ria Bush, MD

## 2017-03-01 NOTE — Assessment & Plan Note (Addendum)
2 ppd smoker. 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 Difficulty quitting. chantix was too expensive. Encouraged ongoing attempts at smoking cessation.

## 2017-03-01 NOTE — Assessment & Plan Note (Signed)
Continue psychiatry care - sees Anderson Malta NP at [facility]

## 2017-03-01 NOTE — Assessment & Plan Note (Signed)
Ongoing smoker. Refilled spiriva. Encouraged she return for spirometry

## 2017-03-01 NOTE — Assessment & Plan Note (Addendum)
3 wk h/o nausea and vomiting associated with some early satiety in setting of son's recent presumed viral illness. No dysphagia, no blood in stool or in emesis, denies significant GERD. In long term smoker, will need further evalution. Start with labs (CBC, CMP, lipase, TSH) and abd [place]. Low threshold for CT imaging.

## 2017-03-02 ENCOUNTER — Telehealth: Payer: Self-pay | Admitting: Family Medicine

## 2017-03-02 ENCOUNTER — Encounter: Payer: Self-pay | Admitting: Family Medicine

## 2017-03-02 DIAGNOSIS — E871 Hypo-osmolality and hyponatremia: Secondary | ICD-10-CM

## 2017-03-02 DIAGNOSIS — D473 Essential (hemorrhagic) thrombocythemia: Secondary | ICD-10-CM | POA: Insufficient documentation

## 2017-03-02 DIAGNOSIS — D75839 Thrombocytosis, unspecified: Secondary | ICD-10-CM | POA: Insufficient documentation

## 2017-03-02 DIAGNOSIS — D649 Anemia, unspecified: Secondary | ICD-10-CM

## 2017-03-02 NOTE — Telephone Encounter (Signed)
Spoke with patient, discussed results.  Rec return for further labs and iFOB.

## 2017-03-02 NOTE — Telephone Encounter (Signed)
Patient is calling to get her lab results.

## 2017-03-05 ENCOUNTER — Other Ambulatory Visit (INDEPENDENT_AMBULATORY_CARE_PROVIDER_SITE_OTHER): Payer: Medicare HMO

## 2017-03-05 ENCOUNTER — Ambulatory Visit
Admission: RE | Admit: 2017-03-05 | Discharge: 2017-03-05 | Disposition: A | Discharge status: Home / Self Care | Payer: Medicare HMO | Source: Ambulatory Visit | Attending: Family Medicine | Admitting: Family Medicine

## 2017-03-05 DIAGNOSIS — E871 Hypo-osmolality and hyponatremia: Secondary | ICD-10-CM

## 2017-03-05 DIAGNOSIS — R112 Nausea with vomiting, unspecified: Secondary | ICD-10-CM | POA: Diagnosis not present

## 2017-03-05 DIAGNOSIS — D649 Anemia, unspecified: Secondary | ICD-10-CM | POA: Diagnosis not present

## 2017-03-05 DIAGNOSIS — Z9049 Acquired absence of other specified parts of digestive tract: Secondary | ICD-10-CM | POA: Insufficient documentation

## 2017-03-05 DIAGNOSIS — D75839 Thrombocytosis, unspecified: Secondary | ICD-10-CM

## 2017-03-05 DIAGNOSIS — D473 Essential (hemorrhagic) thrombocythemia: Secondary | ICD-10-CM

## 2017-03-05 LAB — IBC PANEL
Iron: 29 ug/dL — ABNORMAL LOW (ref 42–145)
SATURATION RATIOS: 5.1 % — AB (ref 20.0–50.0)
TRANSFERRIN: 403 mg/dL — AB (ref 212.0–360.0)

## 2017-03-05 LAB — FERRITIN: Ferritin: 6 ng/mL — ABNORMAL LOW (ref 10.0–291.0)

## 2017-03-05 LAB — FOLATE: Folate: 10.3 ng/mL (ref >5.9)

## 2017-03-05 LAB — VITAMIN B12: Vitamin B-12: 289 pg/mL (ref 211–911)

## 2017-03-05 NOTE — Addendum Note (Signed)
Addended by: Daralene Milch C on: 03/05/2017 11:33 AM   Modules accepted: Orders

## 2017-03-06 LAB — SODIUM, URINE, RANDOM: SODIUM UR: 11 mmol/L — AB (ref 28–272)

## 2017-03-06 LAB — OSMOLALITY, URINE: OSMOLALITY UR: 59 mosm/kg (ref 50–1200)

## 2017-03-08 LAB — PATHOLOGIST SMEAR REVIEW

## 2017-03-09 ENCOUNTER — Telehealth: Payer: Self-pay | Admitting: Family Medicine

## 2017-03-09 NOTE — Telephone Encounter (Signed)
Patient called to get the results of her lab work.

## 2017-03-10 ENCOUNTER — Telehealth: Payer: Self-pay

## 2017-03-10 ENCOUNTER — Other Ambulatory Visit: Payer: Self-pay | Admitting: Family Medicine

## 2017-03-10 ENCOUNTER — Telehealth: Payer: Self-pay | Admitting: Family Medicine

## 2017-03-10 DIAGNOSIS — E871 Hypo-osmolality and hyponatremia: Secondary | ICD-10-CM

## 2017-03-10 MED ORDER — VITAMIN B-12 1000 MCG PO TABS: 1000.0000 ug | ORAL_TABLET | Freq: Every day | ORAL | Status: DC

## 2017-03-10 MED ORDER — FERROUS SULFATE 325 (65 FE) MG PO TABS: 325.0000 mg | ORAL_TABLET | Freq: Every day | ORAL | Status: DC

## 2017-03-10 MED ORDER — ONDANSETRON 8 MG PO TBDP: 8.0000 mg | ORAL_TABLET | Freq: Three times a day (TID) | ORAL | 0 refills | Status: DC | PRN

## 2017-03-10 NOTE — Telephone Encounter (Signed)
Spoke w/ pt - ok to take zofran 8mg  at a time - new dose sent to pharmacy.  She did feel better for 5 days but then again started feeling ill with vomiting.  Endorses drinking 3 bottles of water a day.  I've asked her to return tomorrow for rpt labs - ordered.  plz place on lab visit for tomorrow morning.

## 2017-03-10 NOTE — Telephone Encounter (Signed)
Pt called regarding blood work, requests callback

## 2017-03-10 NOTE — Telephone Encounter (Signed)
Pt left v/m; pt was seen 03/01/17 and was given ondansetron 4 mg; pts daughter is pharmacist; 4 mg of ondansetron was not helping and pts daughter advised pt to take two of the 4 mg which did help the nausea. Pt request new rx for ondansetron 8 mg to [facility].

## 2017-03-10 NOTE — Telephone Encounter (Signed)
See lab results.  

## 2017-03-10 NOTE — Telephone Encounter (Signed)
Spoke to pt and scheduled lab appt

## 2017-03-10 NOTE — Telephone Encounter (Signed)
See result note.  

## 2017-03-11 ENCOUNTER — Encounter (HOSPITAL_COMMUNITY): Payer: Self-pay | Admitting: Emergency Medicine

## 2017-03-11 ENCOUNTER — Telehealth: Payer: Self-pay | Admitting: *Deleted

## 2017-03-11 ENCOUNTER — Other Ambulatory Visit (INDEPENDENT_AMBULATORY_CARE_PROVIDER_SITE_OTHER): Payer: Medicare HMO

## 2017-03-11 ENCOUNTER — Observation Stay (HOSPITAL_COMMUNITY)
Admission: EM | Admit: 2017-03-11 | Discharge: 2017-03-12 | Disposition: A | Discharge status: Home / Self Care | Payer: Medicare HMO | Attending: Internal Medicine | Admitting: Internal Medicine

## 2017-03-11 DIAGNOSIS — Z881 Allergy status to other antibiotic agents status: Secondary | ICD-10-CM | POA: Diagnosis not present

## 2017-03-11 DIAGNOSIS — F319 Bipolar disorder, unspecified: Secondary | ICD-10-CM | POA: Diagnosis not present

## 2017-03-11 DIAGNOSIS — Z23 Encounter for immunization: Secondary | ICD-10-CM | POA: Insufficient documentation

## 2017-03-11 DIAGNOSIS — D509 Iron deficiency anemia, unspecified: Secondary | ICD-10-CM | POA: Diagnosis not present

## 2017-03-11 DIAGNOSIS — K5909 Other constipation: Secondary | ICD-10-CM | POA: Insufficient documentation

## 2017-03-11 DIAGNOSIS — R490 Dysphonia: Secondary | ICD-10-CM | POA: Diagnosis present

## 2017-03-11 DIAGNOSIS — E860 Dehydration: Secondary | ICD-10-CM | POA: Insufficient documentation

## 2017-03-11 DIAGNOSIS — F17210 Nicotine dependence, cigarettes, uncomplicated: Secondary | ICD-10-CM | POA: Insufficient documentation

## 2017-03-11 DIAGNOSIS — E871 Hypo-osmolality and hyponatremia: Principal | ICD-10-CM | POA: Diagnosis present

## 2017-03-11 DIAGNOSIS — R000 Tachycardia, unspecified: Secondary | ICD-10-CM | POA: Insufficient documentation

## 2017-03-11 DIAGNOSIS — J4489 Other specified chronic obstructive pulmonary disease: Secondary | ICD-10-CM | POA: Diagnosis present

## 2017-03-11 DIAGNOSIS — Z888 Allergy status to other drugs, medicaments and biological substances status: Secondary | ICD-10-CM | POA: Insufficient documentation

## 2017-03-11 DIAGNOSIS — J449 Chronic obstructive pulmonary disease, unspecified: Secondary | ICD-10-CM | POA: Insufficient documentation

## 2017-03-11 DIAGNOSIS — R112 Nausea with vomiting, unspecified: Secondary | ICD-10-CM | POA: Diagnosis not present

## 2017-03-11 DIAGNOSIS — Z79899 Other long term (current) drug therapy: Secondary | ICD-10-CM | POA: Diagnosis not present

## 2017-03-11 DIAGNOSIS — R8271 Bacteriuria: Secondary | ICD-10-CM | POA: Diagnosis not present

## 2017-03-11 DIAGNOSIS — Z720 Tobacco use: Secondary | ICD-10-CM | POA: Diagnosis present

## 2017-03-11 DIAGNOSIS — E538 Deficiency of other specified B group vitamins: Secondary | ICD-10-CM | POA: Diagnosis not present

## 2017-03-11 HISTORY — DX: Migraine, unspecified, not intractable, without status migrainosus: G43.909

## 2017-03-11 HISTORY — DX: Personal history of other diseases of the digestive system: Z87.19

## 2017-03-11 HISTORY — DX: Personal history of peptic ulcer disease: Z87.11

## 2017-03-11 HISTORY — DX: Anxiety disorder, unspecified: F41.9

## 2017-03-11 LAB — RAPID URINE DRUG SCREEN, HOSP PERFORMED
AMPHETAMINES: NOT DETECTED
Barbiturates: NOT DETECTED
Benzodiazepines: NOT DETECTED
Cocaine: NOT DETECTED
OPIATES: NOT DETECTED
Tetrahydrocannabinol: NOT DETECTED

## 2017-03-11 LAB — COMPREHENSIVE METABOLIC PANEL
ALBUMIN: 3.2 g/dL — AB (ref 3.5–5.0)
ALK PHOS: 137 U/L — AB (ref 38–126)
ALT: 26 U/L (ref 14–54)
AST: 21 U/L (ref 15–41)
Anion gap: 8 (ref 5–15)
CALCIUM: 8.4 mg/dL — AB (ref 8.9–10.3)
CO2: 25 mmol/L (ref 22–32)
CREATININE: 0.64 mg/dL (ref 0.44–1.00)
Chloride: 90 mmol/L — ABNORMAL LOW (ref 101–111)
GFR calc Af Amer: >60 mL/min (ref >60)
GFR calc non Af Amer: >60 mL/min (ref >60)
GLUCOSE: 98 mg/dL (ref 65–99)
Potassium: 3.9 mmol/L (ref 3.5–5.1)
Sodium: 123 mmol/L — ABNORMAL LOW (ref 135–145)
Total Bilirubin: 0.4 mg/dL (ref 0.3–1.2)
Total Protein: 5.8 g/dL — ABNORMAL LOW (ref 6.5–8.1)

## 2017-03-11 LAB — CBC
HCT: 30.1 % — ABNORMAL LOW (ref 36.0–46.0)
HEMOGLOBIN: 9.8 g/dL — AB (ref 12.0–15.0)
MCH: 26.3 pg (ref 26.0–34.0)
MCHC: 32.6 g/dL (ref 30.0–36.0)
MCV: 80.7 fL (ref 78.0–100.0)
PLATELETS: 385 10*3/uL (ref 150–400)
RBC: 3.73 MIL/uL — ABNORMAL LOW (ref 3.87–5.11)
RDW: 14.8 % (ref 11.5–15.5)
WBC: 9.1 10*3/uL (ref 4.0–10.5)

## 2017-03-11 LAB — BASIC METABOLIC PANEL
ANION GAP: 7 (ref 5–15)
CHLORIDE: 102 mmol/L (ref 101–111)
CO2: 22 mmol/L (ref 22–32)
Calcium: 8.4 mg/dL — ABNORMAL LOW (ref 8.9–10.3)
Creatinine, Ser: 0.66 mg/dL (ref 0.44–1.00)
GFR calc Af Amer: >60 mL/min (ref >60)
GFR calc non Af Amer: >60 mL/min (ref >60)
GLUCOSE: 122 mg/dL — AB (ref 65–99)
POTASSIUM: 3.8 mmol/L (ref 3.5–5.1)
Sodium: 131 mmol/L — ABNORMAL LOW (ref 135–145)

## 2017-03-11 LAB — URINALYSIS, ROUTINE W REFLEX MICROSCOPIC
Bilirubin Urine: NEGATIVE
Glucose, UA: NEGATIVE mg/dL
HGB URINE DIPSTICK: NEGATIVE
Ketones, ur: NEGATIVE mg/dL
Nitrite: NEGATIVE
Protein, ur: NEGATIVE mg/dL
SPECIFIC GRAVITY, URINE: 1.001 — AB (ref 1.005–1.030)
pH: 7 (ref 5.0–8.0)

## 2017-03-11 LAB — OSMOLALITY, URINE: OSMOLALITY UR: 44 mosm/kg — AB (ref 300–900)

## 2017-03-11 LAB — LIPASE, BLOOD: Lipase: 20 U/L (ref 11–51)

## 2017-03-11 LAB — OSMOLALITY: Osmolality: 271 mOsm/kg — ABNORMAL LOW (ref 275–295)

## 2017-03-11 LAB — SODIUM: SODIUM: 121 meq/L — AB (ref 135–145)

## 2017-03-11 MED ORDER — ACETAMINOPHEN 325 MG PO TABS: 650.0000 mg | ORAL_TABLET | Freq: Four times a day (QID) | ORAL | Status: DC | PRN

## 2017-03-11 MED ORDER — ENOXAPARIN SODIUM 30 MG/0.3ML ~~LOC~~ SOLN
30.0000 mg | SUBCUTANEOUS | Status: DC
  Administered 2017-03-11: 30 mg via SUBCUTANEOUS
  Filled 2017-03-11: qty 0.3

## 2017-03-11 MED ORDER — FERROUS SULFATE 325 (65 FE) MG PO TABS
325.0000 mg | ORAL_TABLET | Freq: Every day | ORAL | Status: DC
  Administered 2017-03-12: 325 mg via ORAL
  Filled 2017-03-11: qty 1

## 2017-03-11 MED ORDER — GABAPENTIN 400 MG PO CAPS
1600.0000 mg | ORAL_CAPSULE | Freq: Two times a day (BID) | ORAL | Status: DC
  Administered 2017-03-11 – 2017-03-12 (×2): 1600 mg via ORAL
  Filled 2017-03-11 (×2): qty 4

## 2017-03-11 MED ORDER — QUETIAPINE FUMARATE 400 MG PO TABS
800.0000 mg | ORAL_TABLET | Freq: Every day | ORAL | Status: DC
  Administered 2017-03-11: 800 mg via ORAL
  Filled 2017-03-11: qty 2

## 2017-03-11 MED ORDER — SODIUM CHLORIDE 0.9 % IV SOLN
INTRAVENOUS | Status: AC
  Administered 2017-03-11: 22:00:00 via INTRAVENOUS

## 2017-03-11 MED ORDER — ZIPRASIDONE HCL 80 MG PO CAPS
160.0000 mg | ORAL_CAPSULE | Freq: Every day | ORAL | Status: DC
  Administered 2017-03-11: 160 mg via ORAL
  Filled 2017-03-11 (×2): qty 2

## 2017-03-11 MED ORDER — ONDANSETRON HCL 4 MG PO TABS: 4.0000 mg | ORAL_TABLET | Freq: Four times a day (QID) | ORAL | Status: DC | PRN

## 2017-03-11 MED ORDER — VITAMIN B-12 1000 MCG PO TABS
1000.0000 ug | ORAL_TABLET | Freq: Every day | ORAL | Status: DC
  Administered 2017-03-12: 1000 ug via ORAL
  Filled 2017-03-11: qty 1

## 2017-03-11 MED ORDER — SENNA 8.6 MG PO TABS
1.0000 | ORAL_TABLET | Freq: Two times a day (BID) | ORAL | Status: DC
  Administered 2017-03-11 – 2017-03-12 (×2): 8.6 mg via ORAL
  Filled 2017-03-11 (×2): qty 1

## 2017-03-11 MED ORDER — ALBUTEROL SULFATE (2.5 MG/3ML) 0.083% IN NEBU: 2.5000 mg | INHALATION_SOLUTION | RESPIRATORY_TRACT | Status: DC | PRN

## 2017-03-11 MED ORDER — PNEUMOCOCCAL VAC POLYVALENT 25 MCG/0.5ML IJ INJ
0.5000 mL | INJECTION | INTRAMUSCULAR | Status: AC
  Administered 2017-03-12: 0.5 mL via INTRAMUSCULAR
  Filled 2017-03-11: qty 0.5

## 2017-03-11 MED ORDER — ACETAMINOPHEN 650 MG RE SUPP: 650.0000 mg | Freq: Four times a day (QID) | RECTAL | Status: DC | PRN

## 2017-03-11 MED ORDER — TIOTROPIUM BROMIDE MONOHYDRATE 18 MCG IN CAPS
18.0000 ug | ORAL_CAPSULE | Freq: Every morning | RESPIRATORY_TRACT | Status: DC
  Administered 2017-03-12: 18 ug via RESPIRATORY_TRACT
  Filled 2017-03-11: qty 5

## 2017-03-11 MED ORDER — BUSPIRONE HCL 15 MG PO TABS
30.0000 mg | ORAL_TABLET | Freq: Two times a day (BID) | ORAL | Status: DC
  Administered 2017-03-11 – 2017-03-12 (×2): 30 mg via ORAL
  Filled 2017-03-11 (×2): qty 2

## 2017-03-11 MED ORDER — ONDANSETRON HCL 4 MG/2ML IJ SOLN: 4.0000 mg | Freq: Four times a day (QID) | INTRAMUSCULAR | Status: DC | PRN

## 2017-03-11 MED ORDER — POLYETHYLENE GLYCOL 3350 17 G PO PACK: 17.0000 g | PACK | Freq: Every day | ORAL | Status: DC | PRN

## 2017-03-11 MED ORDER — SODIUM CHLORIDE 0.9% FLUSH
3.0000 mL | Freq: Two times a day (BID) | INTRAVENOUS | Status: DC
  Administered 2017-03-11: 3 mL via INTRAVENOUS

## 2017-03-11 MED ORDER — PANTOPRAZOLE SODIUM 40 MG PO TBEC
40.0000 mg | DELAYED_RELEASE_TABLET | Freq: Every day | ORAL | Status: DC
  Administered 2017-03-11 – 2017-03-12 (×2): 40 mg via ORAL
  Filled 2017-03-11 (×2): qty 1

## 2017-03-11 MED ORDER — NICOTINE 21 MG/24HR TD PT24
21.0000 mg | MEDICATED_PATCH | Freq: Every day | TRANSDERMAL | Status: DC
  Administered 2017-03-11 – 2017-03-12 (×2): 21 mg via TRANSDERMAL
  Filled 2017-03-11 (×2): qty 1

## 2017-03-11 MED ORDER — SODIUM CHLORIDE 0.9 % IV BOLUS (SEPSIS)
1000.0000 mL | Freq: Once | INTRAVENOUS | Status: AC
  Administered 2017-03-11: 1000 mL via INTRAVENOUS

## 2017-03-11 MED ORDER — HYDROXYZINE HCL 25 MG PO TABS
50.0000 mg | ORAL_TABLET | Freq: Three times a day (TID) | ORAL | Status: DC
  Administered 2017-03-11 – 2017-03-12 (×2): 50 mg via ORAL
  Filled 2017-03-11 (×2): qty 2

## 2017-03-11 NOTE — Telephone Encounter (Signed)
Called & spoke with Patient & informed her that her sodium is continuing to deteriorate to dangerous levels  And that Dr Darnell Level. Recommends that she goes to the E.R. Immediately.

## 2017-03-11 NOTE — ED Notes (Signed)
Attempted report 

## 2017-03-11 NOTE — ED Notes (Signed)
Meal Tray given 

## 2017-03-11 NOTE — Telephone Encounter (Signed)
CRITICAL VALUE STICKER  CRITICAL VALUE: Na 121.4  RECEIVER: Daralene Milch, [place] NOTIFIED: 03/11/17 @ 11:35  MESSENGERHope- [facility]  MD NOTIFIED: Dr.Gutierrez   TIME OF NOTIFICATION:11:36  RESPONSE:

## 2017-03-11 NOTE — ED Triage Notes (Signed)
Pt here for low sodium; pt sts hx of viral illness 2 weeks ago and not felt well since

## 2017-03-11 NOTE — H&P (Signed)
History and Physical    Robin Welch SPQ:[id number] DOB: 1966-05-02 DOA: 03/11/2017  PCP: Ria Bush, MD   I have briefly reviewed patients previous medical reports in [facility].  Patient coming from: Home  Chief Complaint: Non-intractable nausea, vomiting, weakness, tiredness. Low sodium.  HPI: Robin Welch is a 51 year old female, lives with her 2 teenage sons, IADL, PMH of ongoing tobacco abuse, COPD, bipolar disorder, prior cocaine abuse (last time was 18 months ago), was advised by her PCP to come to the ED due to above symptoms and progressively worsening hyponatremia. Patient reports being unwell for approximately 4 weeks. 4 weeks ago, her son returned from spending time with his dad and had an episode of nausea and vomiting. He was evaluated by his PCP (same as patient's PCP), extensive tests were on and he was told to have "a virus". Son also passed out at school 5 days later. Patient reports onset of her illness at the same time as her sons. Since 4 weeks, she reports vomiting most days of the week. The vomiting occurs mostly in the mornings, preceded by nausea, usually 2 episodes which are nonbloody and nonbilious followed by some dry heaving. As the day goes on, she has no further nausea or vomiting, has a great appetite, able to eat and drink whatever she wants. No decrease in appetite and states that she might have lost "a couple of pounds". Denies abdominal pain, fever or chills. Chronically constipated and mostly has a BM once per week. She does report feeling more tired and sleepy during the course of the day. She was also told to have "a virus".. Her labs are being closely monitored as outpatient and noted to have low sodium of 121 and was advised to come to the hospital. She reports no headache, LOC, dizziness, lightheadedness or seizure-like activity. She reports no change in medication or dosing of her psychiatric medications. Since her illness began, she has been drinking  excessive amount of water which is what she thinks the doctor advised. She drinks close to 2 L per day of water and also drinks close to 2 L per day of [facility]. She does not like salt and does not use added salt in her meals.  ED Course: Lab work shows sodium 123 but otherwise BMP unremarkable. Hemoglobin 9.8. Unremarkable LFTs. Urine microscopy shows large leukocytes but asymptomatic of urinary symptoms suggestive of UTI. She received normal saline bolus 1 L.  Review of Systems:  All other systems reviewed and apart from HPI, are negative. She denies suicidal or homicidal ideations or auditory or visual hallucinations. She denies induced vomiting.  Past Medical History:  Diagnosis Date  . Bipolar 1 disorder [facility])    sees Dr. Candis Schatz psych [phone])  . COPD (chronic obstructive pulmonary disease) ([place]) 03/2013   hyperinflation by CXR  . History of cocaine dependence ([place]) latest 2017   undergoing detox  . History of migraines   . History of tension headache   . Hx: UTI (urinary tract infection)   . Positive PPD 1989   s/p CXR and INH 6 mo  . Seizure ([place])    from [facility] medicine?   Paulla Fore     Past Surgical History:  Procedure Laterality Date  . CHOLECYSTECTOMY  2006  . TUBAL LIGATION  2003    Social History  reports that she has been smoking Cigarettes.  She has a 72.00 pack-year smoking history. She has never used smokeless tobacco. She reports that she does not  drink alcohol or use drugs.  Allergies  Allergen Reactions  . Prozac [Fluoxetine Hcl] Swelling and Rash  . Doxycycline Nausea And Vomiting  . Azithromycin     Reaction not recalled by the patient    Family History  Problem Relation Age of Onset  . Diabetes Mother        T2DM  . Breast cancer Mother 68  . Dementia Mother   . Lymphoma Mother   . Coronary artery disease Father 73       MI  . Anxiety disorder Paternal Aunt   . Anxiety disorder Cousin   . Depression Cousin   . ADD / ADHD Child   .  Anxiety disorder Child   . Depression Child   . Cancer Neg Hx   . Stroke Neg Hx      Prior to Admission medications   Medication Sig Start Date End Date Taking? Authorizing Provider  busPIRone (BUSPAR) 15 MG tablet Take 2 po qam and 3 po qhs Patient taking differently: Take 30 mg by mouth 3 (three) times daily. Take 2 po qam and 3 po qhs 09/30/16  Yes Plovsky, Berneta Sages, MD  ferrous sulfate 325 (65 FE) MG tablet Take 1 tablet (325 mg total) by mouth daily with breakfast. 03/10/17  Yes Ria Bush, MD  gabapentin (NEURONTIN) 800 MG tablet Take 1,600 mg by mouth 2 (two) times daily.  03/01/17  Yes Ria Bush, MD  hydrOXYzine (ATARAX/VISTARIL) 50 MG tablet Take 1 tablet (50 mg total) by mouth every 6 (six) hours as needed. Patient taking differently: Take 50 mg by mouth 3 (three) times daily.  01/11/17  Yes Plovsky, Berneta Sages, MD  nicotine (NICODERM CQ - DOSED IN MG/24 HOURS) 21 mg/24hr patch Place 21 mg onto the skin daily as needed (FOR SMOKING CESSATION).   Yes [provider]  ondansetron (ZOFRAN-ODT) 8 MG disintegrating tablet Take 1 tablet (8 mg total) by mouth every 8 (eight) hours as needed for nausea or vomiting. 03/10/17  Yes Ria Bush, MD  QUEtiapine (SEROQUEL) 400 MG tablet Take 2 tablets (800 mg total) by mouth at bedtime. 2 qhs Patient taking differently: Take 800 mg by mouth every evening.  09/30/16  Yes Plovsky, Berneta Sages, MD  tiotropium (SPIRIVA HANDIHALER) 18 MCG inhalation capsule Place 1 capsule (18 mcg total) into inhaler and inhale at bedtime. Patient taking differently: Place 18 mcg into inhaler and inhale every morning.  03/01/17  Yes Ria Bush, MD  vitamin B-12 (CYANOCOBALAMIN) 1000 MCG tablet Take 1 tablet (1,000 mcg total) by mouth daily. 03/10/17  Yes Ria Bush, MD  ziprasidone (GEODON) 80 MG capsule Take 3 capsules (240 mg total) by mouth every evening. Take 3  capsules each night at dinner Patient taking differently: Take 160 mg by mouth  every evening.  09/30/16  Yes Norma Fredrickson, MD    Physical Exam: Vitals:   03/11/17 1238 03/11/17 1501 03/11/17 1630 03/11/17 1706  BP: (!) 133/114 120/85 113/79 125/88  Pulse: (!) 105 (!) 102 94 98  Resp: 18 16  (!) 22  Temp: (!) 97.5 F (36.4 C) 98.1 F (36.7 C)    TempSrc: Oral Oral    SpO2: 98% 99% 100% 100%      Constitutional: Pleasant middle-aged female, moderately built and thinly nourished, lying comfortably propped up in the gurney in the ED. Eyes: PERTLA, lids and conjunctivae normal ENMT: Mucous membranes are slightly dry. Posterior pharynx clear of any exudate or lesions. Normal dentition.  Neck: supple, no masses, no thyromegaly Respiratory:  clear to auscultation bilaterally, no wheezing, no crackles. Normal respiratory effort. No accessory muscle use.  Cardiovascular: S1 & S2 heard, regular rate and rhythm, no murmurs / rubs / gallops. No extremity edema. 2+ pedal pulses. No carotid bruits.  Abdomen: No distension, no tenderness, no masses palpated. No hepatosplenomegaly. Bowel sounds normal.  Musculoskeletal: no clubbing / cyanosis. No joint deformity upper and lower extremities. Good ROM, no contractures. Normal muscle tone.  Skin: no rashes, lesions, ulcers. No induration Neurologic: CN 2-12 grossly intact. Sensation intact, DTR normal. Strength 5/5 in all 4 limbs.  Psychiatric: Normal judgment and insight. Alert and oriented x 3. Normal mood.     Labs on Admission: I have personally reviewed following labs and imaging studies  CBC:  Recent Labs Lab 03/11/17 1241  WBC 9.1  HGB 9.8*  HCT 30.1*  MCV 80.7  PLT 650   Basic Metabolic Panel:  Recent Labs Lab 03/11/17 0923 03/11/17 1241  NA 121* 123*  K  --  3.9  CL  --  90*  CO2  --  25  GLUCOSE  --  98  BUN  --  <5*  CREATININE  --  0.64  CALCIUM  --  8.4*   Liver Function Tests:  Recent Labs Lab 03/11/17 1241  AST 21  ALT 26  ALKPHOS 137*  BILITOT 0.4  PROT 5.8*  ALBUMIN 3.2*    Urine analysis:    Component Value Date/Time   COLORURINE STRAW (A) 03/11/2017 1241   APPEARANCEUR HAZY (A) 03/11/2017 1241   LABSPEC 1.001 (L) 03/11/2017 1241   PHURINE 7.0 03/11/2017 1241   GLUCOSEU NEGATIVE 03/11/2017 1241   HGBUR NEGATIVE 03/11/2017 1241   BILIRUBINUR NEGATIVE 03/11/2017 1241   BILIRUBINUR NEG 12/27/2010 1011   KETONESUR NEGATIVE 03/11/2017 1241   PROTEINUR NEGATIVE 03/11/2017 1241   UROBILINOGEN 0.2 12/27/2010 1113   NITRITE NEGATIVE 03/11/2017 1241   LEUKOCYTESUR LARGE (A) 03/11/2017 1241     Radiological Exams on Admission: No results found.  EKG: Independently reviewed. Sinus tachycardia at 10 1 bpm, normal axis, no acute changes and QTC 437 ms.  Assessment/Plan Principal Problem:   Dehydration with hyponatremia Active Problems:   Tobacco abuse   Bipolar 1 disorder (HCC)   COPD (chronic obstructive pulmonary disease) with chronic bronchitis (H[facility])   Hoarseness of voice   Non-intractable vomiting with nausea   Hyponatremia   Iron deficiency anemia     1. Hyponatremia: Likely multifactorial related to some degree of dehydration, possible polydipsia (urine osmolarity 59 on 03/05/17), psychiatric medications. Admitted to telemetry for observation and evaluation. Continue IV normal saline at 75 ML per hour. Follow BMP every 6 hourly and aim to correct sodium by no more than 8-10 milliequivalents in a 24-hour period. Check serum and urine osmolarity. Counseled regarding fluid/free water restriction. TSH 1.35. If sodium does not improve, then may consider further evaluation. Ultrasound abdomen 7/6 showed prior cholecystectomy and no acute findings or significant abnormality. 2. Non-intractable nausea with vomiting: Mostly in the mornings. Unclear etiology. Unlikely due to a viral illness given the prolonged duration. Start oral Protonix 40 mg daily, when necessary antiemetics and monitor. If persists with symptoms, may need further evaluation by GI and can be  pursued as outpatient. 3. COPD: No clinical bronchospasm. Continue Spiriva and when necessary bronchodilator nebulizations. 4. Tobacco abuse: Cessation counseled. Nicotine patch per her request. 5. Bipolar disorder: Stable. Has been on current medications for a long time. Continue same. Follow as outpatient with psychiatry. 6. Iron & B12  deficiency anemia: Continue iron and B12 supplements. Anemia panel: Iron 29, ferritin 6, folate 10.3 and B12: 289 on 03/05/17. Follow CBC in a.m. Outpatient GI evaluation if not completed. 7. Prior cocaine abuse: UDS negative. States that she has not done any in the last 18 months. 8. Asymptomatic bacteriuria: 9. Chronic constipation: Bowel regimen.   DVT prophylaxis: Lovenox  Code Status: Full  Family Communication: None at bedside  Disposition Plan: DC home when medically improved  Consults called: None  Admission status: Telemetry, observation  Severity of Illness: The appropriate patient status for this patient is OBSERVATION. Observation status is judged to be reasonable and necessary in order to provide the required intensity of service to ensure the patient's safety. The patient's presenting symptoms, physical exam findings, and initial radiographic and laboratory data in the context of their medical condition is felt to place them at decreased risk for further clinical deterioration. Furthermore, it is anticipated that the patient will be medically stable for discharge from the hospital within 2 midnights of admission. The following factors support the patient status of observation.   " The patient's presenting symptoms include non-intractable nausea and vomiting, weakness, tiredness. " The physical exam findings include dry mucous membranes sodium . " The initial radiographic and laboratory data are sodium 123.       [facility] [place] MD [facility] Pager [phone]  If 7PM-7AM, please contact night-coverage [email or web address] Password  [facility]  03/11/2017, 5:54 PM

## 2017-03-11 NOTE — Telephone Encounter (Signed)
Please call patient - sodium has continued to deteriorate - to dangerous levels. Recommend she go to ER right away for further treatment of this.

## 2017-03-11 NOTE — Progress Notes (Signed)
Called back for report ,nurse with pt.right now

## 2017-03-11 NOTE — ED Provider Notes (Addendum)
[facility] DEPT Provider Note   CSN: [id number] Arrival date & time: 03/11/17  1233     History   Chief Complaint Chief Complaint  Patient presents with  . Abnormal Lab    HPI Carmelina Balducci is a 51 y.o. female.  HPI Ms. Robin Welch is a 51 year old female sent by her primary care physician due to hyponatremia. She reports that she has been ill for the past 3 weeks. She states that her symptoms began with nausea and vomiting. She states that she was told she had a virus. She thinks she had a virus because her son had similar symptoms. She states that she has continued to have vomiting daily. However, that occurs usually in the morning and then she is able to eat. She reports that the vomitus consists of food or drink. She denies any blood or coffee-ground emesis. She denies any history of GI bleeding. She states that she has been seeing her primary care doctor and her sodium has been low. Has been rechecked twice and has gotten lower each time. She reports that she feels generally weak. She does not think she has had any seizures. She has not had similar episodes in the past. She reports taking Geodon and Seroquel at home. Past Medical History:  Diagnosis Date  . Bipolar 1 disorder [facility])    sees Dr. Candis Schatz psych [phone])  . COPD (chronic obstructive pulmonary disease) ([place]) 03/2013   hyperinflation by CXR  . History of cocaine dependence ([place]) latest 2017   undergoing detox  . History of migraines   . History of tension headache   . Hx: UTI (urinary tract infection)   . Positive PPD 1989   s/p CXR and INH 6 mo  . Seizure ([place])    from HA medicine?   . Smoker     Patient Active Problem List   Diagnosis Date Noted  . Thrombocytosis ([place]) 03/02/2017  . Hyponatremia 03/02/2017  . Anemia, unspecified 03/02/2017  . Non-intractable vomiting with nausea 03/01/2017  . History of cocaine dependence ([place])   . Left-sided chest wall pain 08/23/2015  . Migraine 05/08/2015  .  Hoarseness of voice 04/04/2015  . Cold intolerance 03/08/2015  . Mottled skin 03/08/2015  . Candida vaginitis 01/08/2015  . COPD (chronic obstructive pulmonary disease) with chronic bronchitis ([place]) 09/22/2013  . COPD exacerbation ([place]) 09/22/2013  . Tobacco abuse 12/18/2010  . Bipolar 1 disorder ([place]) 12/18/2010  . Healthcare maintenance 12/18/2010    Past Surgical History:  Procedure Laterality Date  . CHOLECYSTECTOMY  2006  . TUBAL LIGATION  2003    OB History    No data available       Home Medications    Prior to Admission medications   Medication Sig Start Date End Date Taking? Authorizing Provider  busPIRone (BUSPAR) 15 MG tablet Take 2 po qam and 3 po qhs Patient taking differently: 30 mg 2 (two) times daily. Take 2 po qam and 3 po qhs 09/30/16   Plovsky, Berneta Sages, MD  ferrous sulfate 325 (65 FE) MG tablet Take 1 tablet (325 mg total) by mouth daily with breakfast. 03/10/17   Ria Bush, MD  gabapentin (NEURONTIN) 800 MG tablet Take 2 tablets (1,600 mg total) by mouth. 03/01/17   Ria Bush, MD  hydrOXYzine (ATARAX/VISTARIL) 50 MG tablet Take 1 tablet (50 mg total) by mouth every 6 (six) hours as needed. 01/11/17   Plovsky, Berneta Sages, MD  ondansetron (ZOFRAN-ODT) 8 MG disintegrating tablet Take 1 tablet (8 mg total) by mouth  every 8 (eight) hours as needed for nausea or vomiting. 03/10/17   Ria Bush, MD  oxyCODONE-acetaminophen (ROXICET) 5-325 MG tablet Take 1 tablet by mouth every 8 (eight) hours as needed for severe pain. 08/23/15   Ria Bush, MD  QUEtiapine (SEROQUEL) 400 MG tablet Take 2 tablets (800 mg total) by mouth at bedtime. 2 qhs 09/30/16   Plovsky, Berneta Sages, MD  tiotropium (SPIRIVA HANDIHALER) 18 MCG inhalation capsule Place 1 capsule (18 mcg total) into inhaler and inhale at bedtime. 03/01/17   Ria Bush, MD  vitamin B-12 (CYANOCOBALAMIN) 1000 MCG tablet Take 1 tablet (1,000 mcg total) by mouth daily. 03/10/17   Ria Bush, MD    ziprasidone (GEODON) 80 MG capsule Take 3 capsules (240 mg total) by mouth every evening. Take 3  capsules each night at dinner Patient taking differently: Take 160 mg by mouth every evening. Take 3  capsules each night at dinner 09/30/16   Norma Fredrickson, MD    Family History Family History  Problem Relation Age of Onset  . Diabetes Mother        T2DM  . Breast cancer Mother 47  . Dementia Mother   . Lymphoma Mother   . Coronary artery disease Father 45       MI  . Anxiety disorder Paternal Aunt   . Anxiety disorder Cousin   . Depression Cousin   . ADD / ADHD Child   . Anxiety disorder Child   . Depression Child   . Cancer Neg Hx   . Stroke Neg Hx     Social History Social History  Substance Use Topics  . Smoking status: Current Every Day Smoker    Packs/day: 2.00    Years: 36.00    Types: Cigarettes  . Smokeless tobacco: Never Used     Comment: Would like Chantix but cannot afford  . Alcohol use No     Allergies   Prozac [fluoxetine hcl]; Doxycycline; and Azithromycin   Review of Systems Review of Systems   Physical Exam Updated Vital Signs BP 120/85 (BP Location: Right Arm)   Pulse (!) 102   Temp 98.1 F (36.7 C) (Oral)   Resp 16   LMP 01/04/2015   SpO2 99%   Physical Exam  Constitutional: She is oriented to person, place, and time. She appears well-developed.  HENT:  Head: Normocephalic and atraumatic.  Right Ear: External ear normal.  Left Ear: External ear normal.  Eyes: Pupils are equal, round, and reactive to light. EOM are normal.  Neck: Normal range of motion.  Cardiovascular: Normal rate, regular rhythm and normal heart sounds.   Pulmonary/Chest: Effort normal and breath sounds normal.  Abdominal: Soft. Bowel sounds are normal.  Musculoskeletal: Normal range of motion.  Neurological: She is alert and oriented to person, place, and time.  Skin: Skin is warm. Capillary refill takes less than 2 seconds.  Psychiatric: She has a normal mood  and affect.  Nursing note and vitals reviewed.    ED Treatments / Results  Labs (all labs ordered are listed, but only abnormal results are displayed) Labs Reviewed  COMPREHENSIVE METABOLIC PANEL - Abnormal; Notable for the following:       Result Value   Sodium 123 (*)    Chloride 90 (*)    BUN <5 (*)    Calcium 8.4 (*)    Total Protein 5.8 (*)    Albumin 3.2 (*)    Alkaline Phosphatase 137 (*)    All other components within normal  limits  CBC - Abnormal; Notable for the following:    RBC 3.73 (*)    Hemoglobin 9.8 (*)    HCT 30.1 (*)    All other components within normal limits  URINALYSIS, ROUTINE W REFLEX MICROSCOPIC - Abnormal; Notable for the following:    Color, Urine STRAW (*)    APPearance HAZY (*)    Specific Gravity, Urine 1.001 (*)    Leukocytes, UA LARGE (*)    Bacteria, UA FEW (*)    Squamous Epithelial / LPF 0-5 (*)    All other components within normal limits  LIPASE, BLOOD    EKG  EKG Interpretation  Date/Time:  Thursday March 11 2017 16:54:15 EDT Ventricular Rate:  101 PR Interval:    QRS Duration: 68 QT Interval:  337 QTC Calculation: 437 R Axis:   71 Text Interpretation:  Sinus tachycardia Biatrial enlargement Confirmed by Pattricia Boss [phone]) on 03/11/2017 4:58:52 PM       Radiology No results found.  Procedures Procedures (including critical care time)  Medications Ordered in ED Medications - No data to display   Initial Impression / Assessment and Plan / ED Course  I have reviewed the triage vital signs and the nursing notes.  Pertinent labs & imaging results that were available during my care of the patient were reviewed by me and considered in my medical decision making (see chart for details). Vitals:   03/11/17 1238 03/11/17 1501  BP: (!) 133/114 120/85  Pulse: (!) 105 (!) 102  Resp: 18 16  Temp: (!) 97.5 F (36.4 C) 98.1 F (36.7 C)     1- hyponatremia- moderate, worsening, trending worse with symptoms of weakness  and vomiitng.  Plan admission for further evaluation and treatment. 2-abnormal u/a- patient reports frequent urination but states baseline for her, does not report pain or fever. Plan urine culture.  3-anemia- previous hgb 11.8, November 2017, 10.5 7/2- patient reports starting vitamins.  Denies h.o. Rectal bleeding or other gi loss symptoms   51 y.o. Female ho bpad on seroquel , geodon presents today with worsening hyponatremia with symptoms.  Plan iv ns and admit for further treatment and evaluation.  No edema mild tachcardia-no indication for hypertonic saline- plan liter ns here.  UDS pending as h.o. Cocaine abuse although denies use for 18 months.   Discussed with Dr. Algis Liming and he will place patient in observation. Final Clinical Impressions(s) / ED Diagnoses   Final diagnoses:  Hyponatremia    New Prescriptions New Prescriptions   No medications on file     Pattricia Boss, MD 03/11/17 1706    Pattricia Boss, MD 04/03/17 [phone]

## 2017-03-12 DIAGNOSIS — D509 Iron deficiency anemia, unspecified: Secondary | ICD-10-CM | POA: Diagnosis not present

## 2017-03-12 DIAGNOSIS — R112 Nausea with vomiting, unspecified: Secondary | ICD-10-CM | POA: Diagnosis not present

## 2017-03-12 DIAGNOSIS — J449 Chronic obstructive pulmonary disease, unspecified: Secondary | ICD-10-CM | POA: Diagnosis not present

## 2017-03-12 DIAGNOSIS — Z720 Tobacco use: Secondary | ICD-10-CM

## 2017-03-12 DIAGNOSIS — E871 Hypo-osmolality and hyponatremia: Secondary | ICD-10-CM

## 2017-03-12 DIAGNOSIS — F319 Bipolar disorder, unspecified: Secondary | ICD-10-CM

## 2017-03-12 LAB — CBC
HEMATOCRIT: 29.9 % — AB (ref 36.0–46.0)
Hemoglobin: 9.4 g/dL — ABNORMAL LOW (ref 12.0–15.0)
MCH: 25.5 pg — ABNORMAL LOW (ref 26.0–34.0)
MCHC: 31.4 g/dL (ref 30.0–36.0)
MCV: 81.3 fL (ref 78.0–100.0)
Platelets: 372 10*3/uL (ref 150–400)
RBC: 3.68 MIL/uL — ABNORMAL LOW (ref 3.87–5.11)
RDW: 15.2 % (ref 11.5–15.5)
WBC: 6.2 10*3/uL (ref 4.0–10.5)

## 2017-03-12 LAB — URINE CULTURE

## 2017-03-12 LAB — BASIC METABOLIC PANEL
ANION GAP: 7 (ref 5–15)
CO2: 24 mmol/L (ref 22–32)
Calcium: 8.3 mg/dL — ABNORMAL LOW (ref 8.9–10.3)
Chloride: 106 mmol/L (ref 101–111)
Creatinine, Ser: 0.66 mg/dL (ref 0.44–1.00)
GFR calc Af Amer: >60 mL/min (ref >60)
Glucose, Bld: 111 mg/dL — ABNORMAL HIGH (ref 65–99)
POTASSIUM: 4 mmol/L (ref 3.5–5.1)
SODIUM: 137 mmol/L (ref 135–145)

## 2017-03-12 LAB — SODIUM, URINE, RANDOM: Sodium, Ur: <10 mmol/L (ref 28–272)

## 2017-03-12 LAB — HIV ANTIBODY (ROUTINE TESTING W REFLEX): HIV SCREEN 4TH GENERATION: NONREACTIVE

## 2017-03-12 MED ORDER — QUETIAPINE FUMARATE 400 MG PO TABS
800.0000 mg | ORAL_TABLET | Freq: Every day | ORAL | Status: DC
  Filled 2017-03-12: qty 2

## 2017-03-12 MED ORDER — FAMOTIDINE 20 MG PO TABS: 20.0000 mg | ORAL_TABLET | Freq: Two times a day (BID) | ORAL | 0 refills | Status: DC

## 2017-03-12 MED ORDER — NICOTINE 21 MG/24HR TD PT24: 21.0000 mg | MEDICATED_PATCH | Freq: Every day | TRANSDERMAL | 0 refills | Status: DC | PRN

## 2017-03-12 NOTE — Progress Notes (Signed)
Robin Welch to be D/C'd Home per MD order.  Discussed with the patient and all questions fully answered.  VSS, Skin clean, dry and intact without evidence of skin break down, no evidence of skin tears noted. IV catheter discontinued intact. Site without signs and symptoms of complications. Dressing and pressure applied.  An After Visit Summary was printed and given to the patient. Patient received prescription.  D/c education completed with patient/family including follow up instructions, medication list, d/c activities limitations if indicated, with other d/c instructions as indicated by MD - patient able to verbalize understanding, all questions fully answered.   Patient instructed to return to ED, call 911, or call MD for any changes in condition.   Patient escorted to exit and D/C'd home via private auto.  Christoper Fabian Eara Burruel 03/12/2017 12:09 PM

## 2017-03-12 NOTE — Discharge Summary (Signed)
Physician Discharge Summary  Robin Welch WSF:[id number] DOB: 05/26/1966 DOA: 03/11/2017  PCP: Ria Bush, MD  Admit date: 03/11/2017 Discharge date: 03/12/2017  Admitted From: home  Disposition:  home   Recommendations for Outpatient Follow-up:  1. Needs Bmet this coming Monday  Discharge Condition:  stable   CODE STATUS:  Full code   Consultations:  none    Discharge Diagnoses:  Principal Problem:   Dehydration with hyponatremia Active Problems:   Non-intractable vomiting with nausea   Tobacco abuse   Bipolar 1 disorder (HCC)   COPD (chronic obstructive pulmonary disease) with chronic bronchitis (HCC)   Hoarseness of voice   Iron deficiency anemia    Subjective: No complaints of nausea, vomiting, feeling lightheaded, numbness or tingling or headaches.  Eating without issues since being admitted yesterday.  Urinating well. No diarrhea.   HPI:  Robin Welch is a 51 year old female, lives with her 2 teenage sons, IADL, PMH of ongoing tobacco abuse, COPD, bipolar disorder, prior cocaine abuse (last time was 18 months ago), was advised by her PCP to come to the ED due to above symptoms and progressively worsening hyponatremia. Patient reports being unwell for approximately 4 weeks. 4 weeks ago, her son returned from spending time with his dad and had an episode of nausea and vomiting. He was evaluated by his PCP (same as patient's PCP), extensive tests were on and he was told to have "a virus". Son also passed out at school 5 days later. Patient reports onset of her illness at the same time as her sons. Since 4 weeks, she reports vomiting most days of the week. The vomiting occurs mostly in the mornings, preceded by nausea, usually 2 episodes which are nonbloody and nonbilious followed by some dry heaving. As the day goes on, she has no further nausea or vomiting, has a great appetite, able to eat and drink whatever she wants. No decrease in appetite and states that she might have  lost "a couple of pounds". Denies abdominal pain, fever or chills. Chronically constipated and mostly has a BM once per week. She does report feeling more tired and sleepy during the course of the day. She was also told to have "a virus".. Her labs are being closely monitored as outpatient and noted to have low sodium of 121 and was advised to come to the hospital. She reports no headache, LOC, dizziness, lightheadedness or seizure-like activity. She reports no change in medication or dosing of her psychiatric medications. Since her illness began, she has been drinking excessive amount of water which is what she thinks the doctor advised. She drinks close to 2 L per day of water and also drinks close to 2 L per day of [facility]. She does not like salt and does not use added salt in her meals.  Hospital Course:  Hyponatremia - U sodium 11, U osm 59, Sosm 271-  - sodium 121 >> 137 - U output 1500 cc in 24 hr period - advised to drink 6-8 glasses of water daily, stop drinking [facility] and see PCP on Monday for repeat Bmet  Vomiting - daily in the AM for weeks now - yesterday drank 3 Mountain dew starting at 5 AM and subsequently vomited - today has had a solid meal and water and has not vomited - given Protonix yesterday - I have added Pepcid BID to medications  COPD with ongoing tobacco abuse - advised to quit smoking - stable  Bipolar disorder - stable- cont home meds  Asymptomatic Bacteruria - continues to be asymptomatic today  Anemia with Iron and B12 deficiency - cont supplements as prescribed by PCP    Ref. Range 03/05/2017 11:30  Iron Latest Ref Range: 42 - 145 ug/dL 29 (L)  Saturation Ratios Latest Ref Range: 20.0 - 50.0 % 5.1 (L)  Ferritin Latest Ref Range: 10.0 - 291.0 ng/mL 6.0 (L)  Transferrin Latest Ref Range: 212.0 - 360.0 mg/dL 403.0 (H)  Folate Latest Ref Range: >5.9 ng/mL 10.3    Discharge Instructions  Discharge Instructions    Diet general    Complete by:   As directed    Soft easy to digest diet, about 6-8 8 oz glasses of water per day. No sodas.   Increase activity slowly    Complete by:  As directed      Allergies as of 03/12/2017      Reactions   Prozac [fluoxetine Hcl] Swelling, Rash   Doxycycline Nausea And Vomiting   Azithromycin    Reaction not recalled by the patient      Medication List    TAKE these medications   busPIRone 15 MG tablet Commonly known as:  BUSPAR Take 2 po qam and 3 po qhs What changed:  how much to take  how to take this  when to take this  additional instructions   famotidine 20 MG tablet Commonly known as:  PEPCID Take 1 tablet (20 mg total) by mouth 2 (two) times daily.   ferrous sulfate 325 (65 FE) MG tablet Take 1 tablet (325 mg total) by mouth daily with breakfast.   gabapentin 800 MG tablet Commonly known as:  NEURONTIN Take 1,600 mg by mouth 2 (two) times daily.   hydrOXYzine 50 MG tablet Commonly known as:  ATARAX/VISTARIL Take 1 tablet (50 mg total) by mouth every 6 (six) hours as needed. What changed:  when to take this   nicotine 21 mg/24hr patch Commonly known as:  NICODERM CQ - dosed in mg/24 hours Place 1 patch (21 mg total) onto the skin daily as needed (FOR SMOKING CESSATION).   ondansetron 8 MG disintegrating tablet Commonly known as:  ZOFRAN-ODT Take 1 tablet (8 mg total) by mouth every 8 (eight) hours as needed for nausea or vomiting.   QUEtiapine 400 MG tablet Commonly known as:  SEROQUEL Take 2 tablets (800 mg total) by mouth at bedtime. 2 qhs What changed:  when to take this  additional instructions   tiotropium 18 MCG inhalation capsule Commonly known as:  SPIRIVA HANDIHALER Place 1 capsule (18 mcg total) into inhaler and inhale at bedtime. What changed:  when to take this   vitamin B-12 1000 MCG tablet Commonly known as:  CYANOCOBALAMIN Take 1 tablet (1,000 mcg total) by mouth daily.   ziprasidone 80 MG capsule Commonly known as:  GEODON Take 3  capsules (240 mg total) by mouth every evening. Take 3  capsules each night at dinner What changed:  how much to take  additional instructions      Follow-up Information    Ria Bush, MD Follow up.   Specialty:  Family Medicine Why:  Bmet on Monday Contact information: [place] [id number] [phone]          Allergies  Allergen Reactions  . Prozac [Fluoxetine Hcl] Swelling and Rash  . Doxycycline Nausea And Vomiting  . Azithromycin     Reaction not recalled by the patient     Procedures/Studies:    US Abdomen Complete  Result Date: 03/05/2017 CLINICAL DATA:  Nausea, vomiting for 3 weeks EXAM: ABDOMEN ULTRASOUND COMPLETE COMPARISON:  None. FINDINGS: Gallbladder: Prior cholecystectomy Common bile duct: Diameter: Normal caliber, 4 mm Liver: No focal lesion identified. Within normal limits in parenchymal echogenicity. IVC: No abnormality visualized. Pancreas: Visualized portion unremarkable. Spleen: Size and appearance within normal limits. Right Kidney: Length: 11.0 cm. Echogenicity within normal limits. No mass or hydronephrosis visualized. Left Kidney: Length: 10.7 cm. Echogenicity within normal limits. No mass or hydronephrosis visualized. Abdominal aorta: No aneurysm visualized. Other findings: None. IMPRESSION: Prior cholecystectomy. No acute findings or significant abnormality. Electronically Signed   By: Rolm Baptise M.D.   On: 03/05/2017 11:08       Discharge Exam: Vitals:   03/11/17 2010 03/12/17 0513  BP: 130/78 107/80  Pulse: 96 96  Resp: 18 16  Temp: 98.1 F (36.7 C) (!) 97.5 F (36.4 C)   Vitals:   03/11/17 1900 03/11/17 1923 03/11/17 2010 03/12/17 0513  BP: (!) 137/93 126/90 130/78 107/80  Pulse: 96 99 96 96  Resp: 19 16 18 16   Temp:   98.1 F (36.7 C) (!) 97.5 F (36.4 C)  TempSrc:   Oral Oral  SpO2: 98% 100% 100% 99%  Weight:   47.9 kg (105 lb 9.6 oz)   Height:   5\' 2"  (1.575 m)     General: Pt is alert,  awake, not in acute distress Cardiovascular: RRR, S1/S2 +, no rubs, no gallops Respiratory: CTA bilaterally, no wheezing, no rhonchi Abdominal: Soft, NT, ND, bowel sounds + Extremities: no edema, no cyanosis    The results of significant diagnostics from this hospitalization (including imaging, microbiology, ancillary and laboratory) are listed below for reference.     Microbiology: No results found for this or any previous visit (from the past 240 hour(s)).   Labs: BNP (last 3 results) No results for input(s): BNP in the last 8760 hours. Basic Metabolic Panel:  Recent Labs Lab 03/11/17 0923 03/11/17 1241 03/11/17 2212 03/12/17 0336  NA 121* 123* 131* 137  K  --  3.9 3.8 4.0  CL  --  90* 102 106  CO2  --  25 22 24   GLUCOSE  --  98 122* 111*  BUN  --  <5* <5* <5*  CREATININE  --  0.64 0.66 0.66  CALCIUM  --  8.4* 8.4* 8.3*   Liver Function Tests:  Recent Labs Lab 03/11/17 1241  AST 21  ALT 26  ALKPHOS 137*  BILITOT 0.4  PROT 5.8*  ALBUMIN 3.2*    Recent Labs Lab 03/11/17 1241  LIPASE 20   No results for input(s): AMMONIA in the last 168 hours. CBC:  Recent Labs Lab 03/11/17 1241 03/12/17 0336  WBC 9.1 6.2  HGB 9.8* 9.4*  HCT 30.1* 29.9*  MCV 80.7 81.3  PLT 385 372   Cardiac Enzymes: No results for input(s): CKTOTAL, CKMB, CKMBINDEX, TROPONINI in the last 168 hours. BNP: Invalid input(s): POCBNP CBG: No results for input(s): GLUCAP in the last 168 hours. D-Dimer No results for input(s): DDIMER in the last 72 hours. Hgb A1c No results for input(s): HGBA1C in the last 72 hours. Lipid Profile No results for input(s): CHOL, HDL, LDLCALC, TRIG, CHOLHDL, LDLDIRECT in the last 72 hours. Thyroid function studies No results for input(s): TSH, T4TOTAL, T3FREE, THYROIDAB in the last 72 hours.  Invalid input(s): FREET3 Anemia work up No results for input(s): VITAMINB12, FOLATE, FERRITIN, TIBC, IRON, RETICCTPCT in the last 72 hours. Urinalysis  Component Value Date/Time   COLORURINE STRAW (A) 03/11/2017 1241   APPEARANCEUR HAZY (A) 03/11/2017 1241   LABSPEC 1.001 (L) 03/11/2017 1241   PHURINE 7.0 03/11/2017 1241   GLUCOSEU NEGATIVE 03/11/2017 1241   HGBUR NEGATIVE 03/11/2017 1241   BILIRUBINUR NEGATIVE 03/11/2017 1241   BILIRUBINUR NEG 12/27/2010 1011   KETONESUR NEGATIVE 03/11/2017 1241   PROTEINUR NEGATIVE 03/11/2017 1241   UROBILINOGEN 0.2 12/27/2010 1113   NITRITE NEGATIVE 03/11/2017 1241   LEUKOCYTESUR LARGE (A) 03/11/2017 1241   Sepsis Labs Invalid input(s): PROCALCITONIN,  WBC,  LACTICIDVEN Microbiology No results found for this or any previous visit (from the past 240 hour(s)).   Time coordinating discharge: Over 30 minutes  SIGNED:   Debbe Odea, MD  [facility] 03/12/2017, 11:29 AM Pager   If 7PM-7AM, please contact night-coverage [email or web address] Password [email or web address]

## 2017-03-12 NOTE — Progress Notes (Signed)
Admitted pt.from ED ,AAOX 4,no resp.distress noted.;V/S taken & recorded.IV in place on lt.hand.no signs of infilt.noted.Oriented pt.to romm ,call bell.Information guide book given to pt.Fall assessment completed w/ pt.Call light w/ in reach Skin warm & dry .Placed pt.on tele.Will cont.to monitor pt.

## 2017-03-12 NOTE — Consult Note (Signed)
           [facility] CM Primary Care Navigator  03/12/2017  Robin Welch [birth year] [id number]   Went to see patientat the bedsideto identify possible discharge needs but she was alreadydischarge per staffreport . Patient was discharged home today.   Primary care provider's office called (Rena)to notify of patient's discharge and need for post hospital follow-up and transition of care. Notifiedof patient's health issues needing follow-up as well.  Made aware to refer patient to [facility] care management ifdeemed appropriatefor services.    For questions, please contact:  Dannielle Huh, BSN, RN- [facility] Primary Care Navigator  Telephone: [phone] [place]

## 2017-03-12 NOTE — Discharge Instructions (Signed)

## 2017-03-15 ENCOUNTER — Other Ambulatory Visit (INDEPENDENT_AMBULATORY_CARE_PROVIDER_SITE_OTHER): Payer: Medicare HMO

## 2017-03-15 ENCOUNTER — Telehealth: Payer: Self-pay

## 2017-03-15 DIAGNOSIS — E871 Hypo-osmolality and hyponatremia: Secondary | ICD-10-CM

## 2017-03-15 DIAGNOSIS — E860 Dehydration: Secondary | ICD-10-CM

## 2017-03-15 LAB — BASIC METABOLIC PANEL
BUN: 5 mg/dL — ABNORMAL LOW (ref 6–23)
CHLORIDE: 93 meq/L — AB (ref 96–112)
CO2: 26 mEq/L (ref 19–32)
CREATININE: 0.64 mg/dL (ref 0.40–1.20)
Calcium: 8.4 mg/dL (ref 8.4–10.5)
GFR: 102.96 mL/min (ref >60.00)
Glucose, Bld: 103 mg/dL — ABNORMAL HIGH (ref 70–99)
Potassium: 4.4 mEq/L (ref 3.5–5.1)
Sodium: 126 mEq/L — ABNORMAL LOW (ref 135–145)

## 2017-03-15 LAB — OSMOLALITY, URINE

## 2017-03-15 NOTE — Telephone Encounter (Signed)
Pt has appt to see Dr Darnell Level on 03/17/17 for hospital f/u and pt wants B 12 injection; Dr Darnell Level has never ordered B 12 for pt and advised pt to discuss with Dr Darnell Level at 03/17/17 appt. Pt voiced understanding.

## 2017-03-16 ENCOUNTER — Encounter: Payer: Self-pay | Admitting: Gastroenterology

## 2017-03-17 ENCOUNTER — Ambulatory Visit (INDEPENDENT_AMBULATORY_CARE_PROVIDER_SITE_OTHER): Payer: Medicare HMO | Admitting: Family Medicine

## 2017-03-17 ENCOUNTER — Encounter: Payer: Self-pay | Admitting: Family Medicine

## 2017-03-17 VITALS — BP 128/84 | HR 112 | Temp 97.5°F | Ht 62.0 in | Wt 106.0 lb

## 2017-03-17 DIAGNOSIS — Z720 Tobacco use: Secondary | ICD-10-CM | POA: Diagnosis not present

## 2017-03-17 DIAGNOSIS — E538 Deficiency of other specified B group vitamins: Secondary | ICD-10-CM

## 2017-03-17 DIAGNOSIS — F319 Bipolar disorder, unspecified: Secondary | ICD-10-CM | POA: Diagnosis not present

## 2017-03-17 DIAGNOSIS — R112 Nausea with vomiting, unspecified: Secondary | ICD-10-CM | POA: Diagnosis not present

## 2017-03-17 DIAGNOSIS — E871 Hypo-osmolality and hyponatremia: Secondary | ICD-10-CM | POA: Diagnosis not present

## 2017-03-17 DIAGNOSIS — R7989 Other specified abnormal findings of blood chemistry: Secondary | ICD-10-CM

## 2017-03-17 DIAGNOSIS — D509 Iron deficiency anemia, unspecified: Secondary | ICD-10-CM

## 2017-03-17 MED ORDER — PANTOPRAZOLE SODIUM 20 MG PO TBEC: 20.0000 mg | DELAYED_RELEASE_TABLET | Freq: Every day | ORAL | 3 refills | Status: DC

## 2017-03-17 MED ORDER — CYANOCOBALAMIN 1000 MCG/ML IJ SOLN
1000.0000 ug | Freq: Once | INTRAMUSCULAR | Status: AC
  Administered 2017-03-17: 1000 ug via INTRAMUSCULAR

## 2017-03-17 NOTE — Assessment & Plan Note (Signed)
meds have not been changed. Followed by psych.

## 2017-03-17 NOTE — Assessment & Plan Note (Signed)
New. Denies vaginal bleeding, recent UA without hematuria. Did not return iFOB. Pending colonoscopy 05/2017.  Progressive anemia - anticipate dilutional effect after IVF during recent hospitalization given decline in other cell lines - but regardless I have asked her to return tomorrow to recheck.

## 2017-03-17 NOTE — Progress Notes (Signed)
BP 128/84   Pulse (!) 112   Temp (!) 97.5 F (36.4 C)   Ht '5\' 2"'  (1.575 m)   Wt 106 lb (48.1 kg)   LMP 01/04/2015   SpO2 98%   BMI 19.39 kg/m    CC: hosp f/u visit Subjective:    Patient ID: Robin Welch, female    DOB: [birth year], 51 y.o.   MRN: [id number]  HPI: Robin Welch is a 51 y.o. female presenting on 03/17/2017 for Hospitalization [facility] records reviewed. See prior note for details.  5 wk h/o malaise associated with daily episodes of vomiting, on labwork was found to have hyponatremia to 121. After hospital evaluation thought most likely related to hyponatremia due to dehydration, rec increased water intake, back off Maribel. No vomiting while in the hospital.  protonix and pepcid were added to medications. Since home, she has vomited twice - attributes to iron.   Endorses some weakness over last few days. Denies dyspnea, lightheadedness or dizziness.  She drinks 3L caffeine free diet Mt Dew daily. She has just started drinking 3 32 oz bottles of water. She thinks she may be drinking too much fluids.   In hospital: Usod 11, Uosm 44, Sosm 27, Ssod 121 --> 137 -->126.  Discharge Hgb 9.4 (some dilutional effect but likely she was dehydrated upon admission, Hgb 11).  UOP 1500 cc in 24 hours.   Recently found to have anemia with low iron and b12, she started these supplements last week.  She was unable to tolerate iron.  Has not returned stool kit.  Colonoscopy scheduled 05/2017.  Admit date: 03/11/2017 Discharge date: 03/12/2017 TCM phone call not performed.  Admitted From: home  Disposition:  home   Recommendations for Outpatient Follow-up:  1. Needs Bmet this coming Monday  Discharge Condition:  stable   CODE STATUS:  Full code   Consultations:  none   Discharge Diagnoses:  Principal Problem:   Dehydration with hyponatremia Active Problems:   Non-intractable vomiting with nausea   Tobacco abuse   Bipolar 1 disorder (HCC)   COPD (chronic  obstructive pulmonary disease) with chronic bronchitis (HCC)   Hoarseness of voice   Iron deficiency anemia  Relevant past medical, surgical, family and social history reviewed and updated as indicated. Interim medical history since our last visit reviewed. Allergies and medications reviewed and updated. Outpatient Medications Prior to Visit  Medication Sig Dispense Refill  . busPIRone (BUSPAR) 15 MG tablet Take 2 po qam and 3 po qhs (Patient taking differently: Take 30 mg by mouth 3 (three) times daily. Take 2 po qam and 3 po qhs) 450 tablet 3  . ferrous sulfate 325 (65 FE) MG tablet Take 1 tablet (325 mg total) by mouth daily with breakfast.    . gabapentin (NEURONTIN) 800 MG tablet Take 1,600 mg by mouth 2 (two) times daily.     . hydrOXYzine (ATARAX/VISTARIL) 50 MG tablet Take 1 tablet (50 mg total) by mouth every 6 (six) hours as needed. (Patient taking differently: Take 50 mg by mouth 3 (three) times daily. ) 120 tablet 0  . nicotine (NICODERM CQ - DOSED IN MG/24 HOURS) 21 mg/24hr patch Place 1 patch (21 mg total) onto the skin daily as needed (FOR SMOKING CESSATION). 14 patch 0  . ondansetron (ZOFRAN-ODT) 8 MG disintegrating tablet Take 1 tablet (8 mg total) by mouth every 8 (eight) hours as needed for nausea or vomiting. 20 tablet 0  . QUEtiapine (SEROQUEL) 400 MG tablet Take  2 tablets (800 mg total) by mouth at bedtime. 2 qhs (Patient taking differently: Take 800 mg by mouth every evening. ) 180 tablet 1  . tiotropium (SPIRIVA HANDIHALER) 18 MCG inhalation capsule Place 1 capsule (18 mcg total) into inhaler and inhale at bedtime. (Patient taking differently: Place 18 mcg into inhaler and inhale every morning. ) 30 capsule 11  . vitamin B-12 (CYANOCOBALAMIN) 1000 MCG tablet Take 1 tablet (1,000 mcg total) by mouth daily.    . ziprasidone (GEODON) 80 MG capsule Take 3 capsules (240 mg total) by mouth every evening. Take 3  capsules each night at dinner (Patient taking differently: Take 160 mg  by mouth every evening. ) 90 capsule 5  . famotidine (PEPCID) 20 MG tablet Take 1 tablet (20 mg total) by mouth 2 (two) times daily. 30 tablet 0   No facility-administered medications prior to visit.     Past Surgical History:  Procedure Laterality Date  . DILATION AND CURETTAGE OF UTERUS  X 1   S/P miscarriage  . LAPAROSCOPIC CHOLECYSTECTOMY  2006  . TUBAL LIGATION  2003   Per HPI unless specifically indicated in ROS section below Review of Systems     Objective:    BP 128/84   Pulse (!) 112   Temp (!) 97.5 F (36.4 C)   Ht '5\' 2"'  (1.575 m)   Wt 106 lb (48.1 kg)   LMP 01/04/2015   SpO2 98%   BMI 19.39 kg/m   Wt Readings from Last 3 Encounters:  03/17/17 106 lb (48.1 kg)  03/11/17 105 lb 9.6 oz (47.9 kg)  03/01/17 106 lb (48.1 kg)    Physical Exam  Constitutional: She appears well-developed and well-nourished. No distress.  HENT:  Head: Normocephalic and atraumatic.  Mouth/Throat: Oropharynx is clear and moist. No oropharyngeal exudate.  Eyes: Pupils are equal, round, and reactive to light. Conjunctivae and EOM are normal. No scleral icterus.  Neck: Normal range of motion. Neck supple.  Cardiovascular: Normal rate, regular rhythm, normal heart sounds and intact distal pulses.   No murmur heard. Pulmonary/Chest: Effort normal and breath sounds normal. No respiratory distress. She has no wheezes. She has no rales.  Musculoskeletal: She exhibits no edema.  Lymphadenopathy:    She has no cervical adenopathy.  Skin: Skin is warm and dry. No rash noted.  Psychiatric: She has a normal mood and affect.  Nursing note and vitals reviewed.  Results for orders placed or performed in visit on 84/13/24  Basic metabolic panel  Result Value Ref Range   Sodium 126 (L) 135 - 145 mEq/L   Potassium 4.4 3.5 - 5.1 mEq/L   Chloride 93 (L) 96 - 112 mEq/L   CO2 26 19 - 32 mEq/L   Glucose, Bld 103 (H) 70 - 99 mg/dL   BUN 5 (L) 6 - 23 mg/dL   Creatinine, Ser 0.64 0.40 - 1.20 mg/dL    Calcium 8.4 8.4 - 10.5 mg/dL   GFR 102.96 >60.00 mL/min      Assessment & Plan:   Problem List Items Addressed This Visit    Bipolar 1 disorder ([place])    meds have not been changed. Followed by psych.      Hyponatremia - Primary    Presumed due to dehydration at hospital however today endorses significant water and diet mt dew intake - up to 3L mt dew a day. 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 I have asked her to return tomorrow for retesting. Pt agrees with plan. States overall feeling well today, denies nausea, headache, confusion.      Relevant Orders   Basic metabolic panel   Sodium, urine, random   Osmolality, urine   Iron deficiency anemia    New. Denies vaginal bleeding, recent UA without hematuria. Did not return iFOB. Pending colonoscopy 05/2017.  Progressive anemia - anticipate dilutional effect after IVF during recent hospitalization given decline in other cell lines - but regardless I have asked her to return tomorrow to recheck.       Relevant Medications   cyanocobalamin ((VITAMIN B-12)) injection 1,000 mcg (Completed)   Other Relevant Orders   CBC with Differential/Platelet   Non-intractable vomiting with nausea    ?viral. This seems to be improving. She did have 2 vomiting episodes since home from hospital, but attributes these episodes to oral iron intolerance (has since stopped). ?hyponatremia related.      Tobacco abuse    Ongoing smoker, precontemplative.        Other Visit Diagnoses    Low serum vitamin B12       Relevant Medications   cyanocobalamin ((VITAMIN B-12)) injection 1,000 mcg (Completed)       Follow up plan: Return in about 4 weeks (around 04/14/2017) for follow up visit.  Ria Bush, MD

## 2017-03-17 NOTE — Assessment & Plan Note (Addendum)
Presumed due to dehydration at hospital however today endorses significant water and diet mt dew intake - up to 3L mt dew a day. 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 I have asked her to return tomorrow for retesting. Pt agrees with plan. States overall feeling well today, denies nausea, headache, confusion.

## 2017-03-17 NOTE — Assessment & Plan Note (Addendum)
?  viral. This seems to be improving. She did have 2 vomiting episodes since home from hospital, but attributes these episodes to oral iron intolerance (has since stopped). ?hyponatremia related.

## 2017-03-17 NOTE — Patient Instructions (Addendum)
Limit fluid to 64 ounces (2 Liters of fluid a day). Minimize diet mt dew as much as you can and increase water. Max mt dew 1 L - goal to come off this.  Finish up pepcid, and start protonix 20mg  daily at pharmacy.  Still unexplained anemia - I'm glad we have set you up for colonoscopy.  Return tomorrow morning for repeat labs  Let us know if any questions or concerns.  b12 shot today.  Return in 3-4 weeks for follow up visit  1 Liter = 33 oz fluid

## 2017-03-17 NOTE — Assessment & Plan Note (Signed)
Ongoing smoker, precontemplative.

## 2017-03-18 ENCOUNTER — Other Ambulatory Visit: Payer: Self-pay

## 2017-03-19 ENCOUNTER — Telehealth: Payer: Self-pay

## 2017-03-19 ENCOUNTER — Telehealth: Payer: Self-pay | Admitting: Family Medicine

## 2017-03-19 ENCOUNTER — Emergency Department (HOSPITAL_COMMUNITY)
Admission: EM | Admit: 2017-03-19 | Discharge: 2017-03-19 | Disposition: A | Discharge status: Home / Self Care | Payer: Medicare HMO | Attending: Emergency Medicine | Admitting: Emergency Medicine

## 2017-03-19 ENCOUNTER — Other Ambulatory Visit (INDEPENDENT_AMBULATORY_CARE_PROVIDER_SITE_OTHER): Payer: Medicare HMO

## 2017-03-19 ENCOUNTER — Emergency Department (HOSPITAL_COMMUNITY): Payer: Medicare HMO

## 2017-03-19 ENCOUNTER — Encounter (HOSPITAL_COMMUNITY): Payer: Self-pay | Admitting: Emergency Medicine

## 2017-03-19 DIAGNOSIS — D509 Iron deficiency anemia, unspecified: Secondary | ICD-10-CM | POA: Diagnosis not present

## 2017-03-19 DIAGNOSIS — R55 Syncope and collapse: Secondary | ICD-10-CM | POA: Diagnosis present

## 2017-03-19 DIAGNOSIS — R918 Other nonspecific abnormal finding of lung field: Secondary | ICD-10-CM | POA: Insufficient documentation

## 2017-03-19 DIAGNOSIS — R002 Palpitations: Secondary | ICD-10-CM | POA: Diagnosis not present

## 2017-03-19 DIAGNOSIS — J984 Other disorders of lung: Secondary | ICD-10-CM | POA: Diagnosis not present

## 2017-03-19 DIAGNOSIS — Y829 Unspecified medical devices associated with adverse incidents: Secondary | ICD-10-CM | POA: Diagnosis not present

## 2017-03-19 DIAGNOSIS — Z79899 Other long term (current) drug therapy: Secondary | ICD-10-CM | POA: Insufficient documentation

## 2017-03-19 DIAGNOSIS — E871 Hypo-osmolality and hyponatremia: Secondary | ICD-10-CM | POA: Diagnosis not present

## 2017-03-19 DIAGNOSIS — F17210 Nicotine dependence, cigarettes, uncomplicated: Secondary | ICD-10-CM | POA: Diagnosis not present

## 2017-03-19 DIAGNOSIS — T887XXA Unspecified adverse effect of drug or medicament, initial encounter: Secondary | ICD-10-CM | POA: Insufficient documentation

## 2017-03-19 DIAGNOSIS — J449 Chronic obstructive pulmonary disease, unspecified: Secondary | ICD-10-CM | POA: Insufficient documentation

## 2017-03-19 LAB — BASIC METABOLIC PANEL
ANION GAP: 8 (ref 5–15)
BUN: 6 mg/dL (ref 6–23)
BUN: 5 mg/dL — ABNORMAL LOW (ref 6–20)
CALCIUM: 8.5 mg/dL — AB (ref 8.9–10.3)
CHLORIDE: 99 meq/L (ref 96–112)
CHLORIDE: 98 mmol/L — AB (ref 101–111)
CO2: 25 meq/L (ref 19–32)
CO2: 25 mmol/L (ref 22–32)
CREATININE: 0.66 mg/dL (ref 0.40–1.20)
Calcium: 8.6 mg/dL (ref 8.4–10.5)
Creatinine, Ser: 0.57 mg/dL (ref 0.44–1.00)
GFR calc non Af Amer: >60 mL/min (ref >60)
GFR: 99.37 mL/min (ref >60.00)
GLUCOSE: 91 mg/dL (ref 65–99)
Glucose, Bld: 152 mg/dL — ABNORMAL HIGH (ref 70–99)
POTASSIUM: 4 meq/L (ref 3.5–5.1)
Potassium: 4.3 mmol/L (ref 3.5–5.1)
Sodium: 132 mEq/L — ABNORMAL LOW (ref 135–145)
Sodium: 131 mmol/L — ABNORMAL LOW (ref 135–145)

## 2017-03-19 LAB — CBC WITH DIFFERENTIAL/PLATELET
BASOS ABS: 0 10*3/uL (ref 0.0–0.1)
Basophils Relative: 0.3 % (ref 0.0–3.0)
EOS ABS: 0.1 10*3/uL (ref 0.0–0.7)
Eosinophils Relative: 1.2 % (ref 0.0–5.0)
HEMATOCRIT: 31 % — AB (ref 36.0–46.0)
Hemoglobin: 10 g/dL — ABNORMAL LOW (ref 12.0–15.0)
LYMPHS ABS: 1.4 10*3/uL (ref 0.7–4.0)
LYMPHS PCT: 14.9 % (ref 12.0–46.0)
MCHC: 32.3 g/dL (ref 30.0–36.0)
MCV: 83.9 fl (ref 78.0–100.0)
MONOS PCT: 6.5 % (ref 3.0–12.0)
Monocytes Absolute: 0.6 10*3/uL (ref 0.1–1.0)
NEUTROS PCT: 77.1 % — AB (ref 43.0–77.0)
Neutro Abs: 7.3 10*3/uL (ref 1.4–7.7)
Platelets: 495 10*3/uL — ABNORMAL HIGH (ref 150.0–400.0)
RBC: 3.69 Mil/uL — AB (ref 3.87–5.11)
RDW: 16.8 % — ABNORMAL HIGH (ref 11.5–15.5)
WBC: 9.4 10*3/uL (ref 4.0–10.5)

## 2017-03-19 LAB — CBC
HEMATOCRIT: 29.6 % — AB (ref 36.0–46.0)
HEMOGLOBIN: 9.1 g/dL — AB (ref 12.0–15.0)
MCH: 25.6 pg — ABNORMAL LOW (ref 26.0–34.0)
MCHC: 30.7 g/dL (ref 30.0–36.0)
MCV: 83.1 fL (ref 78.0–100.0)
Platelets: 358 10*3/uL (ref 150–400)
RBC: 3.56 MIL/uL — AB (ref 3.87–5.11)
RDW: 15.7 % — ABNORMAL HIGH (ref 11.5–15.5)
WBC: 10 10*3/uL (ref 4.0–10.5)

## 2017-03-19 LAB — I-STAT TROPONIN, ED: Troponin i, poc: 0 ng/mL (ref 0.00–0.08)

## 2017-03-19 MED ORDER — AMOXICILLIN-POT CLAVULANATE 875-125 MG PO TABS: 1.0000 | ORAL_TABLET | Freq: Two times a day (BID) | ORAL | 0 refills | Status: AC

## 2017-03-19 NOTE — Telephone Encounter (Signed)
Pt left v/m and was not able to understand message. I called pt and she said she was in the ED now but they were going to discharge her; pt wanted to get iron infusion while she was at the hospital but pt said since she called [facility] the ED told pt her iron was not that low and did not need an infusion. Pt said she did not need anything now; she was about to be discharged from ED.FYI to Dr Darnell Level.

## 2017-03-19 NOTE — ED Triage Notes (Signed)
Pt reports some syncopal episodes and feeling like her heart is fluttering or stopping when she takes her seroquel. Pt reports these issues have been going on x1 year but last night was worse. Pt states that she has also had some issues with her sodium, iron and b12 recently. Pt a/ox4.

## 2017-03-19 NOTE — Telephone Encounter (Signed)
Patient Name: Robin Welch  DOB: 09/04/1969    Initial Comment Caller States she wants to know if it is safe for her to continue taking her medication with the side affects she is having   Nurse Assessment  Nurse: Emilio Math, RN, Estill Bamberg Date/Time (Eastern Time): 03/19/2017 8:53:19 AM  Confirm and document reason for call. If symptomatic, describe symptoms. ---Caller states she is on Seroquel. This is not a new medication. "Heart is skipping" and she has passed out several times.  Does the patient have any new or worsening symptoms? ---Yes  Will a triage be completed? ---Yes  Related visit to physician within the last 2 weeks? ---No  Does the PT have any chronic conditions? (i.e. diabetes, asthma, etc.) ---Yes  List chronic conditions. ---Bipolar, sodium level is low  Is the patient pregnant or possibly pregnant? (Ask all females between the ages of 7-55) ---No  Is this a behavioral health or substance abuse call? ---No     Guidelines    Guideline Title Affirmed Question Affirmed Notes  Heart Rate and Heartbeat Questions [1] Skipped or extra beat(s) AND [2] occurs 4 or more times per minute    Final Disposition User   See Physician within 4 Hours (or PCP triage) Emilio Math, RN, Estill Bamberg    Comments  Called backline. RN states she will talk to caller, caller is in office.  Called to update caller, states she is leaving. Instructed to go back to office, verbalized understanding.   Referrals  REFERRED TO PCP OFFICE   Disagree/Comply: Comply

## 2017-03-19 NOTE — ED Provider Notes (Signed)
[facility] DEPT Provider Note   CSN: [id number] Arrival date & time: 03/19/17  1058     History   Chief Complaint Chief Complaint  Patient presents with  . Loss of Consciousness  . Irregular Heart Beat    HPI Robin Welch is a 51 y.o. female.  The history is provided by the patient.  Loss of Consciousness   This is a recurrent (related to taking Seroquel.) problem. Episode onset: last episode occured 3 weeks ago. The problem has been resolved. Associated symptoms include chest pain (2 days of brief, sharp, intermittent, nonradiating, nonexertional, right sided.  ) and palpitations. Pertinent negatives include diaphoresis, nausea and vomiting.  Palpitations   This is a recurrent problem. Episode onset: since she has been on Seroquel. typically last <14min. Episode frequency: intermittent, but always related to taking her Seroquel (30-45 min afterwards) Associated symptoms include chest pain (2 days of brief, sharp, intermittent, nonradiating, nonexertional, right sided.  ), irregular heartbeat, near-syncope and syncope. Pertinent negatives include no diaphoresis, no chest pressure, no nausea, no vomiting, no shortness of breath and no sputum production. She has tried nothing for the symptoms.    Past Medical History:  Diagnosis Date  . Anxiety   . Bipolar 1 disorder [facility])    sees Dr. Candis Schatz psych [phone])  . COPD (chronic obstructive pulmonary disease) ([place]) 03/2013   hyperinflation by CXR  . History of cocaine dependence ([place]) latest 2017   undergoing detox  . History of stomach ulcers   . Hx: UTI (urinary tract infection)   . Migraine    "stopped in ~ 2008" (03/11/2017)  . Positive PPD 1989   s/p CXR and INH 6 mo  . Seizure ([place]) X 1   "from takiing too many headache medicine?" (03/11/2017)  . Smoker   . Tension headache, chronic     Patient Active Problem List   Diagnosis Date Noted  . Hyponatremia 03/11/2017  . Iron deficiency anemia 03/11/2017  .  Thrombocytosis ([place]) 03/02/2017  . Non-intractable vomiting with nausea 03/01/2017  . History of cocaine dependence ([place])   . Migraine 05/08/2015  . Hoarseness of voice 04/04/2015  . Mottled skin 03/08/2015  . COPD (chronic obstructive pulmonary disease) with chronic bronchitis ([place]) 09/22/2013  . Tobacco abuse 12/18/2010  . Bipolar 1 disorder ([place]) 12/18/2010    Past Surgical History:  Procedure Laterality Date  . DILATION AND CURETTAGE OF UTERUS  X 1   S/P miscarriage  . LAPAROSCOPIC CHOLECYSTECTOMY  2006  . TUBAL LIGATION  2003    OB History    No data available       Home Medications    Prior to Admission medications   Medication Sig Start Date End Date Taking? Authorizing Provider  busPIRone (BUSPAR) 15 MG tablet Take 2 po qam and 3 po qhs Patient taking differently: Take 30 mg by mouth 3 (three) times daily. Take 2 po qam and 3 po qhs 09/30/16   Plovsky, Berneta Sages, MD  ferrous sulfate 325 (65 FE) MG tablet Take 1 tablet (325 mg total) by mouth daily with breakfast. 03/10/17   Ria Bush, MD  gabapentin (NEURONTIN) 800 MG tablet Take 1,600 mg by mouth 2 (two) times daily.  03/01/17   Ria Bush, MD  hydrOXYzine (ATARAX/VISTARIL) 50 MG tablet Take 1 tablet (50 mg total) by mouth every 6 (six) hours as needed. Patient taking differently: Take 50 mg by mouth 3 (three) times daily.  01/11/17   Plovsky, Berneta Sages, MD  nicotine (NICODERM CQ - DOSED  IN MG/24 HOURS) 21 mg/24hr patch Place 1 patch (21 mg total) onto the skin daily as needed (FOR SMOKING CESSATION). 03/12/17   Debbe Odea, MD  ondansetron (ZOFRAN-ODT) 8 MG disintegrating tablet Take 1 tablet (8 mg total) by mouth every 8 (eight) hours as needed for nausea or vomiting. 03/10/17   Ria Bush, MD  pantoprazole (PROTONIX) 20 MG tablet Take 1 tablet (20 mg total) by mouth daily. 03/17/17   Ria Bush, MD  QUEtiapine (SEROQUEL) 400 MG tablet Take 2 tablets (800 mg total) by mouth at bedtime. 2 qhs Patient  taking differently: Take 800 mg by mouth every evening.  09/30/16   Norma Fredrickson, MD  tiotropium (SPIRIVA HANDIHALER) 18 MCG inhalation capsule Place 1 capsule (18 mcg total) into inhaler and inhale at bedtime. Patient taking differently: Place 18 mcg into inhaler and inhale every morning.  03/01/17   Ria Bush, MD  vitamin B-12 (CYANOCOBALAMIN) 1000 MCG tablet Take 1 tablet (1,000 mcg total) by mouth daily. 03/10/17   Ria Bush, MD  ziprasidone (GEODON) 80 MG capsule Take 3 capsules (240 mg total) by mouth every evening. Take 3  capsules each night at dinner Patient taking differently: Take 160 mg by mouth every evening.  09/30/16   Norma Fredrickson, MD    Family History Family History  Problem Relation Age of Onset  . Diabetes Mother        T2DM  . Breast cancer Mother 65  . Dementia Mother   . Lymphoma Mother   . Coronary artery disease Father 43       MI  . Anxiety disorder Paternal Aunt   . Anxiety disorder Cousin   . Depression Cousin   . ADD / ADHD Child   . Anxiety disorder Child   . Depression Child   . Cancer Neg Hx   . Stroke Neg Hx     Social History Social History  Substance Use Topics  . Smoking status: Current Every Day Smoker    Packs/day: 2.00    Years: 36.00    Types: Cigarettes  . Smokeless tobacco: Never Used  . Alcohol use 0.0 oz/week     Comment: 03/11/2017 "stopped 09/18/2015; never an alcoholic"     Allergies   Prozac [fluoxetine hcl]; Doxycycline; and Azithromycin   Review of Systems Review of Systems  Constitutional: Negative for diaphoresis.  Respiratory: Negative for sputum production and shortness of breath.   Cardiovascular: Positive for chest pain (2 days of brief, sharp, intermittent, nonradiating, nonexertional, right sided.  ), palpitations, syncope and near-syncope.  Gastrointestinal: Negative for nausea and vomiting.  All other systems are reviewed and are negative for acute change except as noted in the  HPI    Physical Exam Updated Vital Signs BP 117/85   Pulse (!) 108   Temp 97.8 F (36.6 C) (Oral)   Resp 20   LMP 01/04/2015   SpO2 99%   Physical Exam  Constitutional: She is oriented to person, place, and time. She appears well-developed and well-nourished. No distress.  HENT:  Head: Normocephalic and atraumatic.  Nose: Nose normal.  Eyes: Pupils are equal, round, and reactive to light. Conjunctivae and EOM are normal. Right eye exhibits no discharge. Left eye exhibits no discharge. No scleral icterus.  Neck: Normal range of motion. Neck supple.  Cardiovascular: Normal rate and regular rhythm.  Exam reveals no gallop and no friction rub.   No murmur heard. Pulmonary/Chest: Effort normal and breath sounds normal. No stridor. No respiratory distress. She has  no rales.  Abdominal: Soft. She exhibits no distension. There is no tenderness.  Musculoskeletal: She exhibits no edema or tenderness.  Neurological: She is alert and oriented to person, place, and time.  Skin: Skin is warm and dry. No rash noted. She is not diaphoretic. No erythema.  Psychiatric: She has a normal mood and affect.  Vitals reviewed.    ED Treatments / Results  Labs (all labs ordered are listed, but only abnormal results are displayed) Labs Reviewed  BASIC METABOLIC PANEL - Abnormal; Notable for the following:       Result Value   Sodium 131 (*)    Chloride 98 (*)    BUN 5 (*)    Calcium 8.5 (*)    All other components within normal limits  CBC - Abnormal; Notable for the following:    RBC 3.56 (*)    Hemoglobin 9.1 (*)    HCT 29.6 (*)    MCH 25.6 (*)    RDW 15.7 (*)    All other components within normal limits  I-STAT TROPONIN, ED    EKG  EKG Interpretation  Date/Time:  Friday March 19 2017 11:02:38 EDT Ventricular Rate:  110 PR Interval:  134 QRS Duration: 66 QT Interval:  324 QTC Calculation: 438 R Axis:   66 Text Interpretation:  Sinus tachycardia Biatrial enlargement Abnormal ECG  No significant change since last tracing Confirmed by Addison Lank [id number]) on 03/19/2017 11:39:18 AM       Radiology Dg Chest 2 View  Result Date: 03/19/2017 CLINICAL DATA:  History of tobacco use and cardiac palpitations, initial encounter EXAM: CHEST  2 VIEW COMPARISON:  08/23/2015 FINDINGS: Cardiac shadow is within normal limits. The lungs are well aerated bilaterally. Patchy somewhat ovoid density is identified in the mid to upper left lung. This may simply represent focal infiltrate. Given the patient's clinical history however the possibility of a mass lesion could not be totally excluded. IMPRESSION: Patchy somewhat nodular appearing density in the left lung as described. This is new from the prior exam. This may simply represent acute infiltrate although the possibility of an underlying lesion could not be totally excluded. Followup PA and lateral chest X-ray is recommended in 3-4 weeks following trial of antibiotic therapy to ensure resolution and exclude underlying malignancy. Electronically Signed   By: Inez Catalina M.D.   On: 03/19/2017 11:34    Procedures Procedures (including critical care time)  Medications Ordered in ED Medications - No data to display   Initial Impression / Assessment and Plan / ED Course  I have reviewed the triage vital signs and the nursing notes.  Pertinent labs & imaging results that were available during my care of the patient were reviewed by me and considered in my medical decision making (see chart for details).      Chest pain is highly inconsistent with ACS. EKG w/o acute ischemia, pericarditis.troponin negative. Feel is sufficient to rule out ACS.  Low pretest probability for pulmonary embolism. Presentation not classic for aortic dissection or esophageal perforation.  Chest x-ray revealed new left upper lobe opacity. Upon further questioning patient reported 2 weeks of persistent nonproductive cough but denied any fevers, chills or sick  contacts. Will provide with trial period of antibiotics and have patient follow-up with PCP for a repeat chest x-ray.  Syncopal episodes appear to be related due to somnolence from Seroquel use. Palpitations also related to Seroquel use. As above there is no prolonged QT on the EKG. No dysrhythmias noted. Labs  without significant electrolyte derangements  The patient is safe for discharge with strict return precautions.  Final Clinical Impressions(s) / ED Diagnoses   Final diagnoses:  Palpitations  Medication side effect   Disposition: Discharge  Condition: Good  I have discussed the results, Dx and Tx plan with the patient who expressed understanding and agree(s) with the plan. Discharge instructions discussed at great length. The patient was given strict return precautions who verbalized understanding of the instructions. No further questions at time of discharge.    Discharge Medication List as of 03/19/2017  3:10 PM    START taking these medications   Details  amoxicillin-clavulanate (AUGMENTIN) 875-125 MG tablet Take 1 tablet by mouth 2 (two) times daily., Starting Fri 03/19/2017, Until Fri 03/26/2017, Print        Follow Up: Ria Bush, MD [place] [id number] [phone]  Schedule an appointment as soon as possible for a visit in 2 weeks for repeat Chest X-Ray      Fatima Blank, MD 03/19/17 [phone]

## 2017-03-19 NOTE — Telephone Encounter (Signed)
Estill Bamberg TH called; pt's heart skipping; passed out several times (not in last 2 days), pt thinks could be related to seroquel. Pt last seen 03/17/17; Dr Darnell Level said pt did not mention passing out at 03/17/17 visit; pt should go to [facility] for eval. Pt notified and voiced understanding and will go to UC now. FYI to Dr Darnell Level.

## 2017-03-20 LAB — OSMOLALITY, URINE: OSMOLALITY UR: 62 mosm/kg (ref 50–1200)

## 2017-03-20 LAB — SODIUM, URINE, RANDOM: Sodium, Ur: 10 mmol/L — ABNORMAL LOW (ref 28–272)

## 2017-03-21 NOTE — Telephone Encounter (Signed)
Routed to Dr. Darnell Level as Juluis Rainier.  Thanks.

## 2017-03-22 NOTE — Telephone Encounter (Signed)
Would call patient - ER records reviewed. If ongoing cough or weight loss, low threshold to check chest CT to better visualize spot seen on ER CXR.

## 2017-03-23 ENCOUNTER — Telehealth: Payer: Self-pay | Admitting: Family Medicine

## 2017-03-23 ENCOUNTER — Ambulatory Visit (INDEPENDENT_AMBULATORY_CARE_PROVIDER_SITE_OTHER): Payer: Medicare HMO | Admitting: Family Medicine

## 2017-03-23 ENCOUNTER — Encounter: Payer: Self-pay | Admitting: Family Medicine

## 2017-03-23 ENCOUNTER — Ambulatory Visit (INDEPENDENT_AMBULATORY_CARE_PROVIDER_SITE_OTHER)
Admission: RE | Admit: 2017-03-23 | Discharge: 2017-03-23 | Disposition: A | Discharge status: Home / Self Care | Payer: Medicare HMO | Source: Ambulatory Visit | Attending: Family Medicine | Admitting: Family Medicine

## 2017-03-23 ENCOUNTER — Other Ambulatory Visit: Payer: Self-pay | Admitting: Family Medicine

## 2017-03-23 VITALS — BP 110/60 | HR 106 | Temp 97.7°F | Wt 108.0 lb

## 2017-03-23 DIAGNOSIS — R51 Headache: Secondary | ICD-10-CM | POA: Diagnosis not present

## 2017-03-23 DIAGNOSIS — R002 Palpitations: Secondary | ICD-10-CM

## 2017-03-23 DIAGNOSIS — R55 Syncope and collapse: Secondary | ICD-10-CM

## 2017-03-23 DIAGNOSIS — Z720 Tobacco use: Secondary | ICD-10-CM

## 2017-03-23 DIAGNOSIS — F319 Bipolar disorder, unspecified: Secondary | ICD-10-CM

## 2017-03-23 DIAGNOSIS — R112 Nausea with vomiting, unspecified: Secondary | ICD-10-CM

## 2017-03-23 DIAGNOSIS — R918 Other nonspecific abnormal finding of lung field: Secondary | ICD-10-CM | POA: Diagnosis not present

## 2017-03-23 DIAGNOSIS — R519 Headache, unspecified: Secondary | ICD-10-CM

## 2017-03-23 DIAGNOSIS — J439 Emphysema, unspecified: Secondary | ICD-10-CM | POA: Diagnosis not present

## 2017-03-23 DIAGNOSIS — D509 Iron deficiency anemia, unspecified: Secondary | ICD-10-CM

## 2017-03-23 MED ORDER — IOPAMIDOL (ISOVUE-300) INJECTION 61%
80.0000 mL | Freq: Once | INTRAVENOUS | Status: AC | PRN
  Administered 2017-03-23: 80 mL via INTRAVENOUS

## 2017-03-23 MED ORDER — KETOROLAC TROMETHAMINE 30 MG/ML IJ SOLN
30.0000 mg | Freq: Once | INTRAMUSCULAR | Status: AC
  Administered 2017-03-23: 30 mg via INTRAMUSCULAR

## 2017-03-23 MED ORDER — LEVOFLOXACIN 500 MG PO TABS: 500.0000 mg | ORAL_TABLET | Freq: Every day | ORAL | 0 refills | Status: DC

## 2017-03-23 NOTE — Telephone Encounter (Signed)
Patient Name: Robin Welch  DOB: 05/19/1966    Initial Comment Caller says, has a severe headache, vomiting, severe pain under her left breast , she wants the results from the Xray of that area -- she wants an Rx. She is aalso gasping for breath on the line with me    Nurse Assessment  Nurse: Raphael Gibney, RN, Vanita Ingles Date/Time (Eastern Time): 03/23/2017 8:09:53 AM  Confirm and document reason for call. If symptomatic, describe symptoms. ---Caller states she has a severe headache. CXR showed a mass on her lung in the ER. She is having pain on the left side of her chest underneath her arm. She sounds SOB. She is vomiting. Has vomited x 2. She is wanting pain medication. Has taken Zofran which is not helping. No fever.  Does the patient have any new or worsening symptoms? ---Yes  Will a triage be completed? ---Yes  Related visit to physician within the last 2 weeks? ---Yes  Does the PT have any chronic conditions? (i.e. diabetes, asthma, etc.) ---No  Is the patient pregnant or possibly pregnant? (Ask all females between the ages of 42-55) ---No  Is this a behavioral health or substance abuse call? ---No     Guidelines    Guideline Title Affirmed Question Affirmed Notes  Chest Pain Difficulty breathing   Headache [1] SEVERE headache (e.g., excruciating) AND [2] "worst headache" of life    Final Disposition User   Go to ED Now Raphael Gibney, RN, Vanita Ingles    Comments  Pt states she does not want to go to the ER but either wants to see doctor or have pain medication called in.  called office and spoke to [facility] and gave report that pt is having chest pain on her left side underneath arm and sounds SOB on the phone. Also has a severe headache with vomiting. Triage outcome of go to ER now but pt does not want to go.   Referrals  GO TO FACILITY REFUSED   Disagree/Comply: Disagree  Disagree/Comply Reason: Disagree with instructions  Disagree/Comply: Disagree  Disagree/Comply Reason: Disagree with instructions

## 2017-03-23 NOTE — Telephone Encounter (Signed)
Pt has appointment today. She did go to the ER and was admitted

## 2017-03-23 NOTE — Patient Instructions (Addendum)
See Rosaria Ferries to schedule for today a head CT and chest CT.  Toradol 30mg  shot today.  We will be in touch with results.  Decrease seroquel to 400mg  at bedtime. Touch base with psych about this.

## 2017-03-23 NOTE — Telephone Encounter (Signed)
I spoke with Robin Welch and she request cb about the large mass found on CXR on 03/19/17. Robin Welch said she is not having SOB but H/A pain level 8 and N&V but Robin Welch is able to keep down water now. Robin Welch said zofran is not helping N&V. Robin Welch taking Aleve and Tylenol for H/A with no relief. Dr Darnell Level will see Robin Welch today at 10:45. FYI to Dr Darnell Level.

## 2017-03-23 NOTE — Assessment & Plan Note (Signed)
Ongoing constant 8/10 headache over last 4 days although patient doesn't seem under acute distress. She initially requested percocet, I recommended against narcotics to treat headache.  New onset headache in setting of possible lung lesion by CXR is concerning - will check head CT w/ w/o contrast to further evaluate this. Pt agrees with plan.

## 2017-03-23 NOTE — Telephone Encounter (Signed)
Will see today at 10:45am

## 2017-03-23 NOTE — Progress Notes (Signed)
BP 110/60 (BP Location: Left Arm, Patient Position: Sitting, Cuff Size: Normal)   Pulse (!) 106   Temp 97.7 F (36.5 C) (Oral)   Wt 108 lb (49 kg)   LMP 01/04/2015   SpO2 98%   BMI 19.75 kg/m    CC: headche Subjective:    Patient ID: Robin Welch, female    DOB: 07-12-1966, 51 y.o.   MRN: [id number]  HPI: Robin Welch is a 51 y.o. female presenting on 03/23/2017 for Headache (Severe in nature. Causing nausea and vomiting. Zofran is not helping. Nothing was given for the headaches.) and ER Follow-up   Complicated history - see chart review for details.  Most recently seen at ER with CXR showing LUL hazy density ?PNA started on augmentin. Pt denies any fever, productive cough. Chronic dyspnea. She endorses sharp left chest pain.   Also endorsed heart flutters to seroquel, as well as endorses syncopal episodes for >2 yrs (only happens when she gets up on an empty stomach after taking seroquel). This happens maybe once a month.  Presents today with 4d h/o severe dull HA throughout entire head (8/10 pain). Denies vision changes, phonophobia. Some photophobia. Headache causes some nausea. No improvement despite 2 aleve and 2 tylenol.  Years ago she had bad HA (unclear if ever officially diagnosed with migraines) and headache medicine tried (thinks a triptan) caused seizures.   Weight gain noted - she has been drinking ensurePLUS and V8.  Relevant past medical, surgical, family and social history reviewed and updated as indicated. Interim medical history since our last visit reviewed. Allergies and medications reviewed and updated. Outpatient Medications Prior to Visit  Medication Sig Dispense Refill  . amoxicillin-clavulanate (AUGMENTIN) 875-125 MG tablet Take 1 tablet by mouth 2 (two) times daily. 14 tablet 0  . busPIRone (BUSPAR) 15 MG tablet Take 2 po qam and 3 po qhs (Patient taking differently: Take 30 mg by mouth 3 (three) times daily. Take 2 po qam and 3 po qhs) 450 tablet 3  .  gabapentin (NEURONTIN) 800 MG tablet Take 1,600 mg by mouth 2 (two) times daily.     . hydrOXYzine (ATARAX/VISTARIL) 50 MG tablet Take 1 tablet (50 mg total) by mouth every 6 (six) hours as needed. (Patient taking differently: Take 50 mg by mouth 3 (three) times daily. ) 120 tablet 0  . ondansetron (ZOFRAN-ODT) 8 MG disintegrating tablet Take 1 tablet (8 mg total) by mouth every 8 (eight) hours as needed for nausea or vomiting. 20 tablet 0  . pantoprazole (PROTONIX) 20 MG tablet Take 1 tablet (20 mg total) by mouth daily. 30 tablet 3  . tiotropium (SPIRIVA HANDIHALER) 18 MCG inhalation capsule Place 1 capsule (18 mcg total) into inhaler and inhale at bedtime. (Patient taking differently: Place 18 mcg into inhaler and inhale every morning. ) 30 capsule 11  . vitamin B-12 (CYANOCOBALAMIN) 1000 MCG tablet Take 1 tablet (1,000 mcg total) by mouth daily.    . ziprasidone (GEODON) 80 MG capsule Take 3 capsules (240 mg total) by mouth every evening. Take 3  capsules each night at dinner (Patient taking differently: Take 160 mg by mouth every evening. ) 90 capsule 5  . QUEtiapine (SEROQUEL) 400 MG tablet Take 2 tablets (800 mg total) by mouth at bedtime. 2 qhs (Patient taking differently: Take 800 mg by mouth every evening. ) 180 tablet 1  . ferrous sulfate 325 (65 FE) MG tablet Take 1 tablet (325 mg total) by mouth daily with breakfast. (Patient not taking:  Reported on 03/23/2017)    . nicotine (NICODERM CQ - DOSED IN MG/24 HOURS) 21 mg/24hr patch Place 1 patch (21 mg total) onto the skin daily as needed (FOR SMOKING CESSATION). (Patient not taking: Reported on 03/23/2017) 14 patch 0   No facility-administered medications prior to visit.      Per HPI unless specifically indicated in ROS section below Review of Systems     Objective:    BP 110/60 (BP Location: Left Arm, Patient Position: Sitting, Cuff Size: Normal)   Pulse (!) 106   Temp 97.7 F (36.5 C) (Oral)   Wt 108 lb (49 kg)   LMP 01/04/2015    SpO2 98%   BMI 19.75 kg/m   Wt Readings from Last 3 Encounters:  03/23/17 108 lb (49 kg)  03/17/17 106 lb (48.1 kg)  03/11/17 105 lb 9.6 oz (47.9 kg)    Physical Exam  Constitutional: She is oriented to person, place, and time. She appears well-developed and well-nourished. No distress.  thin  HENT:  Head: Normocephalic and atraumatic.  Mouth/Throat: Oropharynx is clear and moist. No oropharyngeal exudate.  Eyes: Pupils are equal, round, and reactive to light. Conjunctivae and EOM are normal. No scleral icterus.  Neck: Normal range of motion. Neck supple. No thyromegaly present.  Cardiovascular: Normal rate, regular rhythm, normal heart sounds and intact distal pulses.   No murmur heard. Pulmonary/Chest: Effort normal and breath sounds normal. No respiratory distress. She has no wheezes. She has no rales.  No rales appreciated  Abdominal: Soft. Bowel sounds are normal. She exhibits no distension and no mass. There is no tenderness. There is no rebound and no guarding.  Musculoskeletal: She exhibits no edema.  Lymphadenopathy:    She has no cervical adenopathy.  Neurological: She is alert and oriented to person, place, and time. She has normal strength. No cranial nerve deficit. She displays a negative Romberg sign.  CN 2-12 intact EOMI  Skin: Skin is warm and dry. No rash noted.  Psychiatric: She has a normal mood and affect.  Nursing note and vitals reviewed.  Results for orders placed or performed during the hospital encounter of 25/95/63  Basic metabolic panel  Result Value Ref Range   Sodium 131 (L) 135 - 145 mmol/L   Potassium 4.3 3.5 - 5.1 mmol/L   Chloride 98 (L) 101 - 111 mmol/L   CO2 25 22 - 32 mmol/L   Glucose, Bld 91 65 - 99 mg/dL   BUN 5 (L) 6 - 20 mg/dL   Creatinine, Ser 0.57 0.44 - 1.00 mg/dL   Calcium 8.5 (L) 8.9 - 10.3 mg/dL   GFR calc non Af Amer >60 >60 mL/min   GFR calc Af Amer >60 >60 mL/min   Anion gap 8 5 - 15  CBC  Result Value Ref Range   WBC  10.0 4.0 - 10.5 K/uL   RBC 3.56 (L) 3.87 - 5.11 MIL/uL   Hemoglobin 9.1 (L) 12.0 - 15.0 g/dL   HCT 29.6 (L) 36.0 - 46.0 %   MCV 83.1 78.0 - 100.0 fL   MCH 25.6 (L) 26.0 - 34.0 pg   MCHC 30.7 30.0 - 36.0 g/dL   RDW 15.7 (H) 11.5 - 15.5 %   Platelets 358 150 - 400 K/uL  I-stat troponin, ED  Result Value Ref Range   Troponin i, poc 0.00 0.00 - 0.08 ng/mL   Comment 3           CHEST  2 VIEW COMPARISON:  08/23/2015  FINDINGS: Cardiac shadow is within normal limits. The lungs are well aerated bilaterally. Patchy somewhat ovoid density is identified in the mid to upper left lung. This may simply represent focal infiltrate. Given the patient's clinical history however the possibility of a mass lesion could not be totally excluded. IMPRESSION: Patchy somewhat nodular appearing density in the left lung as described. This is new from the prior exam. This may simply represent acute infiltrate although the possibility of an underlying lesion could not be totally excluded. Followup PA and lateral chest X-ray is recommended in 3-4 weeks following trial of antibiotic therapy to ensure resolution and exclude underlying malignancy. Electronically Signed   By: Inez Catalina M.D.   On: 03/19/2017 11:34    Assessment & Plan:   Problem List Items Addressed This Visit    Abnormal x-ray of lung - Primary    Recent ER xray reviewed - new patchy ovoid density upper L lung. ?acute infiltrate vs mass - patient does not have any infectious symptoms of fever, productive cough. She does endorse sharp fleeting chest pains and has known chronic dyspnea, unchanged. In setting of known smoker and previous weight loss, I think it prudent to proceed with chest CT with contrast for further evaluation. Pt agrees with plan.       Relevant Orders   CT Chest W Contrast   Acute nonintractable headache    Ongoing constant 8/10 headache over last 4 days although patient doesn't seem under acute distress. She initially  requested percocet, I recommended against narcotics to treat headache.  New onset headache in setting of possible lung lesion by CXR is concerning - will check head CT w/ w/o contrast to further evaluate this. Pt agrees with plan.       Relevant Medications   ketorolac (TORADOL) 30 MG/ML injection 30 mg (Completed)   Other Relevant Orders   CT HEAD W & WO CONTRAST   Bipolar 1 disorder ([place])    She does not remember psychiatrist name.  Endorsing longstanding palpitations and syncope at night after seroquel 800mg  dose but she has not discussed this with myself or apparently with psych in the past either.  EKG reassuringly without QT prolongation during ER visit.  I did suggest she decrease seroquel to 400mg  at night but I asked her to contact her psychiatrist to check with him first.       Relevant Medications   QUEtiapine (SEROQUEL) 400 MG tablet   Iron deficiency anemia    Ongoing - pending colonoscopy 05/2017.      Non-intractable vomiting with nausea    She did have a nausea/vomiting free period but this recurred on Friday with commencement of headache. See below.       Tobacco abuse    Other Visit Diagnoses    Syncope, unspecified syncope type       Palpitations           Follow up plan: Return if symptoms worsen or fail to improve.  Ria Bush, MD

## 2017-03-23 NOTE — Telephone Encounter (Signed)
Left message to call office

## 2017-03-23 NOTE — Telephone Encounter (Signed)
[facility] called -  Pt refused ed outcome for chest pain, shortness of breath, vomiting, and left arm pain.  TH will send over report

## 2017-03-23 NOTE — Telephone Encounter (Signed)
Pt is actually being seen in the office right now

## 2017-03-23 NOTE — Assessment & Plan Note (Signed)
She did have a nausea/vomiting free period but this recurred on Friday with commencement of headache. See below.

## 2017-03-23 NOTE — Assessment & Plan Note (Signed)
Ongoing - pending colonoscopy 05/2017.

## 2017-03-23 NOTE — Assessment & Plan Note (Signed)
She does not remember psychiatrist name.  Endorsing longstanding palpitations and syncope at night after seroquel 800mg  dose but she has not discussed this with myself or apparently with psych in the past either.  EKG reassuringly without QT prolongation during ER visit.  I did suggest she decrease seroquel to 400mg  at night but I asked her to contact her psychiatrist to check with him first.

## 2017-03-23 NOTE — Assessment & Plan Note (Signed)
Recent ER xray reviewed - new patchy ovoid density upper L lung. ?acute infiltrate vs mass - patient does not have any infectious symptoms of fever, productive cough. She does endorse sharp fleeting chest pains and has known chronic dyspnea, unchanged. In setting of known smoker and previous weight loss, I think it prudent to proceed with chest CT with contrast for further evaluation. Pt agrees with plan.

## 2017-03-24 ENCOUNTER — Telehealth: Payer: Self-pay | Admitting: Family Medicine

## 2017-03-24 DIAGNOSIS — R197 Diarrhea, unspecified: Secondary | ICD-10-CM

## 2017-03-24 NOTE — Telephone Encounter (Signed)
Called patient and gave her the Appt info to see GI on 04/05/17 with P.A.for thickened Esophagus. She said she took the Levaquin 1 pill yesterday and 1 pill today and woke up with extreme diarrhea today.Pls advise if she needs to try a different antibiotic.

## 2017-03-24 NOTE — Telephone Encounter (Signed)
Spoke with patient. rec stop levaquin. Update if ongoing diarrhea Limited in options. Will not prescibe further abx at this time, just finish augmentin.  Discussed expedited GI referral and reasons for this.

## 2017-03-24 NOTE — Telephone Encounter (Signed)
Pt left another v/m; pt has been drinking more water and is "coming around". Pt still has H/A and diarrhea but diarrhea has slowed down now. Pt still does not feel good and request cb.

## 2017-03-24 NOTE — Telephone Encounter (Signed)
Pt left v/m; pt said the diarrhea has slowed down more; pt feeling slightly better but pt does not want to take anymore of the levaquin and pt request cb.

## 2017-03-24 NOTE — Telephone Encounter (Signed)
Pt left v/m; pt was started on levaquin on 03/23/17; pt has had diarrhea since taking 2nd dose of levaquin; pt is not sure if she should or can take the levaquin; when pt stands she feels faint. Pt states "she feels horrible." Pt request cb.

## 2017-03-25 NOTE — Telephone Encounter (Signed)
Come in for C diff test. Ordered.  Ensure no fevers or blood in stool. May take imodium OTC 1-2 tablets daily as needed.

## 2017-03-25 NOTE — Addendum Note (Signed)
Addended by: Ria Bush on: 03/25/2017 10:00 AM   Modules accepted: Orders

## 2017-03-25 NOTE — Telephone Encounter (Signed)
Pt left v/m; pt stopped levaquin as instructed and pt felt better yesterday afternoon but this morning diarrhea has come back; pt states "has been to bathroom 50 times"; pt request med to stop diarrhea. Pt request cb. [facility].

## 2017-03-25 NOTE — Telephone Encounter (Signed)
Pt left /vm that she did not need to leave message for Dr Darnell Level; diarrhea has stopped.

## 2017-03-25 NOTE — Telephone Encounter (Signed)
Lm on pts vm requesting a call back 

## 2017-03-31 HISTORY — PX: ESOPHAGOGASTRODUODENOSCOPY: SHX1529

## 2017-04-02 ENCOUNTER — Ambulatory Visit: Payer: Medicare HMO | Admitting: Family Medicine

## 2017-04-05 ENCOUNTER — Ambulatory Visit: Payer: Self-pay | Admitting: Physician Assistant

## 2017-04-14 ENCOUNTER — Ambulatory Visit: Payer: Self-pay | Admitting: Family Medicine

## 2017-04-16 ENCOUNTER — Telehealth: Payer: Self-pay

## 2017-04-16 ENCOUNTER — Ambulatory Visit (INDEPENDENT_AMBULATORY_CARE_PROVIDER_SITE_OTHER): Payer: Medicare HMO | Admitting: Physician Assistant

## 2017-04-16 ENCOUNTER — Encounter: Payer: Self-pay | Admitting: Physician Assistant

## 2017-04-16 ENCOUNTER — Encounter (INDEPENDENT_AMBULATORY_CARE_PROVIDER_SITE_OTHER): Payer: Self-pay

## 2017-04-16 VITALS — BP 110/70 | HR 84 | Ht 62.0 in | Wt 113.2 lb

## 2017-04-16 DIAGNOSIS — R918 Other nonspecific abnormal finding of lung field: Secondary | ICD-10-CM

## 2017-04-16 DIAGNOSIS — R9389 Abnormal findings on diagnostic imaging of other specified body structures: Secondary | ICD-10-CM

## 2017-04-16 DIAGNOSIS — R938 Abnormal findings on diagnostic imaging of other specified body structures: Secondary | ICD-10-CM | POA: Diagnosis not present

## 2017-04-16 DIAGNOSIS — Z1211 Encounter for screening for malignant neoplasm of colon: Secondary | ICD-10-CM

## 2017-04-16 DIAGNOSIS — K219 Gastro-esophageal reflux disease without esophagitis: Secondary | ICD-10-CM

## 2017-04-16 MED ORDER — NA SULFATE-K SULFATE-MG SULF 17.5-3.13-1.6 GM/177ML PO SOLN: 1.0000 | ORAL | 0 refills | Status: DC

## 2017-04-16 NOTE — Telephone Encounter (Signed)
CXR ordered - thank you.

## 2017-04-16 NOTE — Telephone Encounter (Signed)
Pt left v/m; pt is seeing [facility] 04/16/17 at 9:45. Pt is having UGI done due to esophagus being swollen. Pt also to have repeat CXR 4 weeks after finish abx per CT chest results dated 03/23/17. Pt request cb with appt for CXR.

## 2017-04-16 NOTE — Patient Instructions (Signed)

## 2017-04-16 NOTE — Progress Notes (Signed)
Reviewed and agree with documentation and assessment and plan. K. Veena Rawan Riendeau , MD   

## 2017-04-16 NOTE — Telephone Encounter (Signed)
Spoke to the pt, she needs a repeat chest x ray before next Tuesday, has a GI appt. GI wants her to have it done before that appt. Can you order?

## 2017-04-16 NOTE — Progress Notes (Signed)
Chief Complaint: Abnormal Ct chest  HPI:  Robin Welch is a 51 year old Caucasian female with a past medical history as listed below including COPD and smoking 2 packs per day for over 30 years,  who was referred to me by Ria Bush, MD for a complaint of abnormal CT of the chest .     Recent CT of the chest with contrast showed multilobar bronchopneumonia as well as a small hiatal hernia and profound thickening of the distal third of esophagus which may reflect changes of reflux esophagitis versus esophageal neoplasia, also diffuse bronchial wall thickening with mild central lobular and paraseptal emphysema was aortic atherosclerosis. Patient was started on antibiotics for pneumonia and told to have repeat chest x-ray in 3-4 weeks. She has not done this yet.   Today, the patient describes that she has occasional reflux symptoms maybe once per week," if I eat the wrong thing". She also had one episode of dysphagia where she felt as though something got stuck in her throat yesterday, prior to that she was not having any problems. Patient does tell me she was recently diagnosed with pneumonia and started on antibiotics and feels much better after taking this but has not had her repeat chest x-ray yet. She plans to schedule this within the next week.   Patient does tell me that her mother has problems with dysphagia but she is unaware of any history of esophageal or GI cancers. Patient does admit to smoking 2 packs of cigarettes per day since she was 51 years old. She does not believe she can stop because Chantix is too expensive and " I can't vape". She is a Oceanographer.   Patient denies fever, chills, blood in her stool, melena, weight loss,  change in bowel habits, nausea, vomiting or symptoms that awaken her at night.  Past Medical History:  Diagnosis Date  . Anxiety   . Bipolar 1 disorder [facility])    sees Dr. Candis Schatz psych [phone])  . COPD (chronic obstructive pulmonary disease)  ([place]) 03/2013   hyperinflation by CXR  . History of cocaine dependence ([place]) latest 2017   undergoing detox  . History of stomach ulcers   . Hx: UTI (urinary tract infection)   . Migraine    "stopped in ~ 2008" (03/11/2017)  . Positive PPD 1989   s/p CXR and INH 6 mo  . Seizure ([place]) X 1   "from takiing too many headache medicine?" (03/11/2017)  . Smoker   . Tension headache, chronic     Past Surgical History:  Procedure Laterality Date  . DILATION AND CURETTAGE OF UTERUS  X 1   S/P miscarriage  . LAPAROSCOPIC CHOLECYSTECTOMY  2006  . TUBAL LIGATION  2003    Current Outpatient Prescriptions  Medication Sig Dispense Refill  . busPIRone (BUSPAR) 15 MG tablet Take 2 po qam and 3 po qhs (Patient taking differently: Take 30 mg by mouth 3 (three) times daily. Take 2 po qam and 3 po qhs) 450 tablet 3  . ferrous sulfate 325 (65 FE) MG tablet Take 1 tablet (325 mg total) by mouth daily with breakfast.    . gabapentin (NEURONTIN) 800 MG tablet Take 1,600 mg by mouth 2 (two) times daily.     . hydrOXYzine (ATARAX/VISTARIL) 50 MG tablet Take 1 tablet (50 mg total) by mouth every 6 (six) hours as needed. (Patient taking differently: Take 50 mg by mouth 3 (three) times daily. ) 120 tablet 0  . QUEtiapine (SEROQUEL) 400 MG  tablet Take 1 tablet (400 mg total) by mouth at bedtime.    Marland Kitchen tiotropium (SPIRIVA HANDIHALER) 18 MCG inhalation capsule Place 1 capsule (18 mcg total) into inhaler and inhale at bedtime. (Patient taking differently: Place 18 mcg into inhaler and inhale every morning. ) 30 capsule 11  . vitamin B-12 (CYANOCOBALAMIN) 1000 MCG tablet Take 1 tablet (1,000 mcg total) by mouth daily.    . ziprasidone (GEODON) 80 MG capsule Take 3 capsules (240 mg total) by mouth every evening. Take 3  capsules each night at dinner (Patient taking differently: Take 160 mg by mouth every evening. ) 90 capsule 5   No current facility-administered medications for this visit.     Allergies as of  04/16/2017 - Review Complete 04/16/2017  Allergen Reaction Noted  . Prozac [fluoxetine hcl] Swelling and Rash 12/18/2010  . Doxycycline Nausea And Vomiting 10/31/2014  . Azithromycin  04/03/2015    Family History  Problem Relation Age of Onset  . Diabetes Mother        T2DM  . Breast cancer Mother 70  . Dementia Mother   . Lymphoma Mother   . Coronary artery disease Father 69       MI  . Anxiety disorder Paternal Aunt   . Anxiety disorder Cousin   . Depression Cousin   . ADD / ADHD Child   . Anxiety disorder Child   . Depression Child   . Cancer Neg Hx   . Stroke Neg Hx   . Colon cancer Neg Hx   . Esophageal cancer Neg Hx     Social History   Social History  . Marital status: Divorced    Spouse name: N/A  . Number of children: 2  . Years of education: bachelors   Occupational History  . 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 Smoking status: Current Every Day Smoker    Packs/day: 2.00    Years: 36.00    Types: Cigarettes  . Smokeless tobacco: Never Used  . Alcohol use 0.0 oz/week     Comment: 03/11/2017 "stopped 09/18/2015; never an alcoholic"  . Drug use: No     Comment: 03/11/2017 "stopped 09/18/2015"  . Sexual activity: No   Other Topics Concern  . Not on file   Social History Narrative   Caffeine: 40 oz diet mountain dew/day   Lives with husband and 2 children, no pets          Review of Systems:    Constitutional: No weight loss, feverOr chills Skin: No rash  Cardiovascular: No chest pain   Respiratory: Positive for chronic shortness of breath Gastrointestinal: See HPI and otherwise negative Genitourinary: No dysuria  Neurological: No headache Musculoskeletal: No new muscle or joint pain Hematologic: No bleeding or bruising Psychiatric: Positive for anxiety and bipolar disorder   Physical Exam:  Vital signs: BP 110/70   Pulse 84   Ht 5\' 2"  (1.575 m)   Wt 113 lb 3.2  oz (51.3 kg)   LMP 01/04/2015   BMI 20.70 kg/m   Constitutional:   Pleasant chronically ill appearing Caucasian female appears to be in NAD, Well developed, Well nourished, alert and cooperative Head:  Normocephalic and atraumatic. Eyes:   PEERL, EOMI. No icterus. Conjunctiva pink. Ears:  Normal auditory acuity. Neck:  Supple Throat: Oral cavity and pharynx without inflammation, swelling or lesion.  Respiratory: Respirations even and unlabored. Lungs clear to auscultation bilaterally.  No wheezes, crackles, or rhonchi.  Cardiovascular: Normal S1, S2. No MRG. Regular rate and rhythm. No peripheral edema, cyanosis or pallor.  Gastrointestinal:  Soft, nondistended, nontender. No rebound or guarding. Normal bowel sounds. No appreciable masses or hepatomegaly. Rectal:  Not performed.  Msk:  Symmetrical without gross deformities. Without edema, no deformity or joint abnormality.  Neurologic:  Alert and  oriented x4;  grossly normal neurologically.  Skin:   Dry and intact without significant lesions or rashes. Psychiatric: Demonstrates good judgement and reason without abnormal affect or behaviors.  MOST RECENT LABS AND IMAGING: CBC    Component Value Date/Time   WBC 10.0 03/19/2017 1110   RBC 3.56 (L) 03/19/2017 1110   HGB 9.1 (L) 03/19/2017 1110   HCT 29.6 (L) 03/19/2017 1110   PLT 358 03/19/2017 1110   MCV 83.1 03/19/2017 1110   MCH 25.6 (L) 03/19/2017 1110   MCHC 30.7 03/19/2017 1110   RDW 15.7 (H) 03/19/2017 1110   LYMPHSABS 1.4 03/19/2017 0908   MONOABS 0.6 03/19/2017 0908   EOSABS 0.1 03/19/2017 0908   BASOSABS 0.0 03/19/2017 0908    CMP     Component Value Date/Time   NA 131 (L) 03/19/2017 1110   K 4.3 03/19/2017 1110   CL 98 (L) 03/19/2017 1110   CO2 25 03/19/2017 1110   GLUCOSE 91 03/19/2017 1110   BUN 5 (L) 03/19/2017 1110   CREATININE 0.57 03/19/2017 1110   CALCIUM 8.5 (L) 03/19/2017 1110   PROT 5.8 (L) 03/11/2017 1241   ALBUMIN 3.2 (L) 03/11/2017 1241   AST  21 03/11/2017 1241   ALT 26 03/11/2017 1241   ALKPHOS 137 (H) 03/11/2017 1241   BILITOT 0.4 03/11/2017 1241   GFRNONAA >60 03/19/2017 1110   GFRAA >60 03/19/2017 1110    Assessment: 1. Abnormal CT of the chest : Thickening of the esophagus, patient does describe intermittent reflux symptoms once a week as well as 1 episode of dysphagia yesterday, no previous history of this, history of 2 packs per day smoking for 30+ years; concern for malignancy 2. GERD: Intermittent, possibly once a week 3. Screening colonoscopy  Plan: 1. Patient is already scheduled for a screening colonoscopy with Dr. Silverio Decamp in late September. She will continue this as scheduled. 2. Scheduled patient for an EGD with Dr. Silverio Decamp in the [facility] in 2 weeks. Discussed risks, benefits, limitations and alternatives the patient agrees to proceed. 3. Dr. Silverio Decamp will be sent a message to see if she would be willing to do procedures as scheduled together, sooner, otherwise she does not have time for a double procedure until October 4. Patient is calling her PCP today to have repeat chest x-ray to ensure that pneumonia is improved before time of procedure, she is symptomatically improved today 5. Patient to follow in clinic per recommendations after time of procedure  Ellouise Newer, Hershal Coria [facility] 04/16/2017, 10:03 AM  Cc: Ria Bush, MD

## 2017-04-19 ENCOUNTER — Encounter: Payer: Self-pay | Admitting: Gastroenterology

## 2017-04-20 ENCOUNTER — Ambulatory Visit (INDEPENDENT_AMBULATORY_CARE_PROVIDER_SITE_OTHER)
Admission: RE | Admit: 2017-04-20 | Discharge: 2017-04-20 | Disposition: A | Discharge status: Home / Self Care | Payer: Medicare HMO | Source: Ambulatory Visit | Attending: Family Medicine | Admitting: Family Medicine

## 2017-04-20 DIAGNOSIS — R05 Cough: Secondary | ICD-10-CM | POA: Diagnosis not present

## 2017-04-20 DIAGNOSIS — R0602 Shortness of breath: Secondary | ICD-10-CM | POA: Diagnosis not present

## 2017-04-20 DIAGNOSIS — R918 Other nonspecific abnormal finding of lung field: Secondary | ICD-10-CM

## 2017-04-21 ENCOUNTER — Telehealth: Payer: Self-pay | Admitting: Family Medicine

## 2017-04-21 NOTE — Telephone Encounter (Signed)
Patient called to get the results of her x-ray.

## 2017-04-22 NOTE — Telephone Encounter (Signed)
See result note.  

## 2017-04-28 ENCOUNTER — Ambulatory Visit (AMBULATORY_SURGERY_CENTER): Payer: Medicare HMO | Admitting: Gastroenterology

## 2017-04-28 ENCOUNTER — Encounter: Payer: Self-pay | Admitting: Gastroenterology

## 2017-04-28 VITALS — BP 136/73 | HR 86 | Temp 99.8°F | Resp 12 | Ht 62.0 in | Wt 113.0 lb

## 2017-04-28 DIAGNOSIS — B3781 Candidal esophagitis: Secondary | ICD-10-CM | POA: Diagnosis not present

## 2017-04-28 DIAGNOSIS — D509 Iron deficiency anemia, unspecified: Secondary | ICD-10-CM | POA: Diagnosis not present

## 2017-04-28 DIAGNOSIS — K219 Gastro-esophageal reflux disease without esophagitis: Secondary | ICD-10-CM

## 2017-04-28 DIAGNOSIS — J449 Chronic obstructive pulmonary disease, unspecified: Secondary | ICD-10-CM | POA: Diagnosis not present

## 2017-04-28 DIAGNOSIS — R933 Abnormal findings on diagnostic imaging of other parts of digestive tract: Secondary | ICD-10-CM | POA: Diagnosis not present

## 2017-04-28 MED ORDER — OMEPRAZOLE 40 MG PO CPDR: 40.0000 mg | DELAYED_RELEASE_CAPSULE | Freq: Two times a day (BID) | ORAL | 11 refills | Status: DC

## 2017-04-28 MED ORDER — SODIUM CHLORIDE 0.9 % IV SOLN: 500.0000 mL | INTRAVENOUS | Status: DC

## 2017-04-28 NOTE — Patient Instructions (Signed)
YOU HAD AN ENDOSCOPIC PROCEDURE TODAY AT [facility]:   Refer to the procedure report that was given to you for any specific questions about what was found during the examination.  If the procedure report does not answer your questions, please call your gastroenterologist to clarify.  If you requested that your care partner not be given the details of your procedure findings, then the procedure report has been included in a sealed envelope for you to review at your convenience later.  YOU SHOULD EXPECT: Some feelings of bloating in the abdomen. Passage of more gas than usual.  Walking can help get rid of the air that was put into your GI tract during the procedure and reduce the bloating. If you had a lower endoscopy (such as a colonoscopy or flexible sigmoidoscopy) you may notice spotting of blood in your stool or on the toilet paper. If you underwent a bowel prep for your procedure, you may not have a normal bowel movement for a few days.  Please Note:  You might notice some irritation and congestion in your nose or some drainage.  This is from the oxygen used during your procedure.  There is no need for concern and it should clear up in a day or so.  SYMPTOMS TO REPORT IMMEDIATELY:    Following upper endoscopy (EGD)  Vomiting of blood or coffee ground material  New chest pain or pain under the shoulder blades  Painful or persistently difficult swallowing  New shortness of breath  Fever of 100F or higher  Black, tarry-looking stools  For urgent or emergent issues, a gastroenterologist can be reached at any hour by calling [phone].   DIET:  We do recommend a small meal at first, but then you may proceed to your regular diet.  Drink plenty of fluids but you should avoid alcoholic beverages for 24 hours.  ACTIVITY:  You should plan to take it easy for the rest of today and you should NOT DRIVE or use heavy machinery until tomorrow (because of the sedation medicines used  during the test).    FOLLOW UP: Our staff will call the number listed on your records the next business day following your procedure to check on you and address any questions or concerns that you may have regarding the information given to you following your procedure. If we do not reach you, we will leave a message.  However, if you are feeling well and you are not experiencing any problems, there is no need to return our call.  We will assume that you have returned to your regular daily activities without incident.  If any biopsies were taken you will be contacted by phone or by letter within the next 1-3 weeks.  Please call us at [phone] if you have not heard about the biopsies in 3 weeks.    SIGNATURES/CONFIDENTIALITY: You and/or your care partner have signed paperwork which will be entered into your electronic medical record.  These signatures attest to the fact that that the information above on your After Visit Summary has been reviewed and is understood.  Full responsibility of the confidentiality of this discharge information lies with you and/or your care-partner.  Hiatal hernia information given. Pick up omeprazole from pharmacy. Take as directed. Repeat endoscopy in 3 months to check healing. Return to GI clinic in 2 months.

## 2017-04-28 NOTE — Progress Notes (Signed)
Called to room to assist during endoscopic procedure.  Patient ID and intended procedure confirmed with present staff. Received instructions for my participation in the procedure from the performing physician.  

## 2017-04-28 NOTE — Op Note (Signed)
[facility] Patient Name: Robin Welch Procedure Date: 04/28/2017 1:16 PM MRN: [id number] Endoscopist: Mauri Pole , MD Age: 51 Referring MD:  Date of Birth: 06-02-1966 Gender: Female Account #: [id number] Procedure:                Upper GI endoscopy Indications:              Suspected esophageal reflux, Abnormal CT of the GI                            tract Medicines:                Monitored Anesthesia Care Procedure:                Pre-Anesthesia Assessment:                           - Prior to the procedure, a History and Physical                            was performed, and patient medications and                            allergies were reviewed. The patient's tolerance of                            previous anesthesia was also reviewed. The risks                            and benefits of the procedure and the sedation                            options and risks were discussed with the patient.                            All questions were answered, and informed consent                            was obtained. Prior Anticoagulants: The patient has                            taken no previous anticoagulant or antiplatelet                            agents. ASA Grade Assessment: III - A patient with                            severe systemic disease. After reviewing the risks                            and benefits, the patient was deemed in                            satisfactory condition to undergo the procedure.  After obtaining informed consent, the endoscope was                            passed under direct vision. Throughout the                            procedure, the patient's blood pressure, pulse, and                            oxygen saturations were monitored continuously. The                            Endoscope was introduced through the mouth, and                            advanced to the second part of duodenum. The  upper                            GI endoscopy was accomplished without difficulty.                            The patient tolerated the procedure well. Scope In: Scope Out: Findings:                 There were esophageal mucosal changes suspicious                            for long-segment Barrett's esophagus , 2 tongues                            present in the lower third of the esophagus. The                            maximum longitudinal extent of these mucosal                            changes was 4 cm in length. Mucosa was biopsied                            with a cold forceps for histology in a targeted                            manner at intervals of 1 cm in the lower third of                            the esophagus. One specimen bottle was sent to                            pathology. Z-line at 34cm                           LA Grade D (one or more mucosal breaks involving at  least 75% of esophageal circumference)                            esophagitis/ulceration with bleeding was found 25                            to 34 cm from the incisors. Biopsies were taken                            with a cold forceps for histology.                           A 4 cm hiatal hernia was present.                           The cardia and gastric fundus were normal on                            retroflexion.                           The examined duodenum was normal. Complications:            No immediate complications. Estimated Blood Loss:     Estimated blood loss was minimal. Impression:               - Esophageal mucosal changes suspicious for                            long-segment Barrett's esophagus. Biopsied.                           - LA Grade D esophagitis. Biopsied.                           - 4 cm hiatal hernia.                           - Normal examined duodenum. Recommendation:           - Resume previous diet.                           -  Continue present medications.                           - Await pathology results.                           -Omeprazole 40mg  BID, 30 minutes before breakfast                            and dinner X 60 capsules with 11 refills                           - Repeat upper endoscopy in 3 months to check  healing.                           - Return to GI clinic in 2 months. Mauri Pole, MD 04/28/2017 1:46:47 PM This report has been signed electronically.

## 2017-04-28 NOTE — Progress Notes (Signed)
To recovery, report to RN, VSS. 

## 2017-04-29 ENCOUNTER — Telehealth: Payer: Self-pay

## 2017-04-29 NOTE — Telephone Encounter (Signed)
  Follow up Call-  Call back number 04/28/2017  Post procedure Call Back phone  # [phone]  Permission to leave phone message Yes  Some recent data might be hidden     Patient questions:  Do you have a fever, pain , or abdominal swelling? No. Pain Score  0 *  Have you tolerated food without any problems? Yes.    Have you been able to return to your normal activities? Yes.    Do you have any questions about your discharge instructions: Diet   No. Medications  No. Follow up visit  No.  Do you have questions or concerns about your Care? No.  Actions: * If pain score is 4 or above: No action needed, pain <4.  No problems noted per pt. maw

## 2017-05-04 DIAGNOSIS — F3175 Bipolar disorder, in partial remission, most recent episode depressed: Secondary | ICD-10-CM | POA: Diagnosis not present

## 2017-05-07 ENCOUNTER — Other Ambulatory Visit: Payer: Self-pay

## 2017-05-07 MED ORDER — FLUCONAZOLE 100 MG PO TABS: 100.0000 mg | ORAL_TABLET | Freq: Every day | ORAL | 0 refills | Status: AC

## 2017-05-09 ENCOUNTER — Encounter: Payer: Self-pay | Admitting: Family Medicine

## 2017-05-21 ENCOUNTER — Encounter: Payer: Self-pay | Admitting: Gastroenterology

## 2017-05-28 ENCOUNTER — Telehealth: Payer: Self-pay | Admitting: Family Medicine

## 2017-05-28 NOTE — Telephone Encounter (Signed)
Do not see on pt's current med list.  However, I do see pt has used Nicoderm patch in past. Last OV:  03/23/17, next OV:  None.

## 2017-05-31 NOTE — Telephone Encounter (Signed)
Patient called and said she's out of her medication and she needs the medication refilled today.  Patient can be reached at [phone].

## 2017-05-31 NOTE — Telephone Encounter (Signed)
Spoke with pt notifying her the refill was sent to the pharmacy. Says ok.

## 2017-05-31 NOTE — Telephone Encounter (Signed)
PLEASE NOTE: All timestamps contained within this report are represented as [place] Standard Time. CONFIDENTIALTY NOTICE: This fax transmission is intended only for the addressee. It contains information that is legally privileged, confidential or otherwise protected from use or disclosure. If you are not the intended recipient, you are strictly prohibited from reviewing, disclosing, copying using or disseminating any of this information or taking any action in reliance on or regarding this information. If you have received this fax in error, please notify us immediately by telephone so that we can arrange for its return to [place]. Phone: [phone], Toll-Free: [phone], Fax: [phone] Page: 1 of 1 Call Id: [id number] [place] Patient Name: Robin Welch Gender: Female DOB: 07/26/1966 Age: 51 Y 54 M 26 D Return Phone Number: [phone] (Primary) Address: City/State/Zip: [place] [address] Client [facility] - Client Client Site [place] - Day Physician Ria Bush - MD Contact Type Call Who Is Calling Patient / Member / Family / Caregiver Call Type Triage / Clinical Relationship To Patient Self Return Phone Number [phone] (Primary) Chief Complaint Prescription Refill or Medication Request (non symptomatic) Reason for Call Symptomatic / Request for Health Information Initial Comment needs refill on chantix Appointment Disposition EMR Appointment Not Necessary Info pasted into Epic No Translation No Nurse Assessment Nurse: Mills-Hernandez, RN, Izora Gala Date/Time (Eastern Time): 05/30/2017 10:01:09 AM Confirm and document reason for call. If symptomatic, describe symptoms. ---Caller states that she is needing her Chantix refilled. Pharmacy faxed request to office but never heard back. Does the patient have any new or worsening symptoms?  ---No Guidelines Guideline Title Affirmed Question Affirmed Notes Nurse Date/Time (Eastern Time) Disp. Time Eilene Ghazi Time) Disposition Final User 05/30/2017 10:02:12 AM Clinical Call Yes Mills-Hernandez, RN, Izora Gala Comments User: Haroldine Laws, RN Date/Time Eilene Ghazi Time): 05/30/2017 10:01:50 AM Caller to phone office in the morning.

## 2017-06-01 MED ORDER — VARENICLINE TARTRATE 1 MG PO TABS
1.0000 mg | ORAL_TABLET | Freq: Two times a day (BID) | ORAL | 1 refills | Status: DC
Start: 2017-06-01 — End: 2017-06-21

## 2017-06-01 NOTE — Telephone Encounter (Signed)
Spoke with pt relaying message per Dr. Darnell Level. She expresses her thanks.

## 2017-06-01 NOTE — Telephone Encounter (Signed)
Pt called stating she went to pick up Chantix rx and it was another starter kit, which she says she just finished. Pt requests continuing pack.

## 2017-06-01 NOTE — Addendum Note (Signed)
Addended by: Ria Bush on: 06/01/2017 02:13 PM   Modules accepted: Orders

## 2017-06-01 NOTE — Telephone Encounter (Addendum)
plz notify continuing month pack sent to [facility] pharmacy with RF x1.

## 2017-06-02 ENCOUNTER — Other Ambulatory Visit: Payer: Self-pay | Admitting: Family Medicine

## 2017-06-08 DIAGNOSIS — F3175 Bipolar disorder, in partial remission, most recent episode depressed: Secondary | ICD-10-CM | POA: Diagnosis not present

## 2017-06-21 ENCOUNTER — Ambulatory Visit (INDEPENDENT_AMBULATORY_CARE_PROVIDER_SITE_OTHER): Payer: Medicare HMO | Admitting: Family Medicine

## 2017-06-21 ENCOUNTER — Encounter: Payer: Self-pay | Admitting: Family Medicine

## 2017-06-21 VITALS — BP 106/58 | HR 103 | Temp 97.8°F | Ht 62.0 in | Wt 115.1 lb

## 2017-06-21 DIAGNOSIS — B370 Candidal stomatitis: Secondary | ICD-10-CM | POA: Diagnosis not present

## 2017-06-21 DIAGNOSIS — E538 Deficiency of other specified B group vitamins: Secondary | ICD-10-CM | POA: Diagnosis not present

## 2017-06-21 DIAGNOSIS — R070 Pain in throat: Secondary | ICD-10-CM

## 2017-06-21 LAB — VITAMIN B12: VITAMIN B 12: 665 pg/mL (ref 211–911)

## 2017-06-21 LAB — POCT RAPID STREP A (OFFICE): RAPID STREP A SCREEN: NEGATIVE

## 2017-06-21 MED ORDER — NYSTATIN 100000 UNIT/ML MT SUSP: 5.0000 mL | Freq: Four times a day (QID) | OROMUCOSAL | 0 refills | Status: DC

## 2017-06-21 MED ORDER — VARENICLINE TARTRATE 1 MG PO TABS: 1.0000 mg | ORAL_TABLET | Freq: Two times a day (BID) | ORAL | 1 refills | Status: DC

## 2017-06-21 NOTE — Addendum Note (Signed)
Addended by: Marrion Coy on: 06/21/2017 11:03 AM   Modules accepted: Orders

## 2017-06-21 NOTE — Assessment & Plan Note (Addendum)
Only Very faint Fermin plaques on buccal mucosa- eRx sent for nystatin rinse. If symptoms do not improve, needs to be re evaluated. Rapid strep neg.  Will also check Vit B12 as she has a h/o vit B12 deficiency and this can lead to oral pain. The patient indicates understanding of these issues and agrees with the plan.

## 2017-06-21 NOTE — Patient Instructions (Signed)
Great to see you.  Use Nystatin

## 2017-06-21 NOTE — Progress Notes (Signed)
Subjective:   Patient ID: Robin Welch, female    DOB: 15-Jun-1966, 51 y.o.   MRN: [id number]  Robin Welch is a pleasant 51 y.o. year old female who presents to clinic today with Oral Pain (Patient is here today C/O of mouth pain and feels that she may have thrush and this feels like that. She also is C/O chest congestion that worsens at night.  She sayd that she is not able to afford the Spiriva so she stopped taking it.)  on 06/21/2017  HPI:  Pt of Dr. Darnell Level, new to me.  Complicated medical history.  Has mouth pain- she feels perhaps it is thrush.  PCP had placed her on spiriva  (most recent note from PCP reviewed from 03/23/17) but she is not taking it due to cost.  Having some congestion.  Wants to be swabbed for strep throat.  Current Outpatient Prescriptions on File Prior to Visit  Medication Sig Dispense Refill  . busPIRone (BUSPAR) 15 MG tablet Take 2 po qam and 3 po qhs (Patient taking differently: Take 30 mg by mouth 3 (three) times daily. Take 2 po qam and 3 po qhs) 450 tablet 3  . gabapentin (NEURONTIN) 800 MG tablet Take 800 mg by mouth 4 (four) times daily.     . hydrOXYzine (ATARAX/VISTARIL) 50 MG tablet Take 1 tablet (50 mg total) by mouth every 6 (six) hours as needed. (Patient taking differently: Take 50 mg by mouth 3 (three) times daily. ) 120 tablet 0  . pantoprazole (PROTONIX) 20 MG tablet TAKE 1 TABLET(20 MG) BY MOUTH DAILY 30 tablet 3  . QUEtiapine (SEROQUEL) 400 MG tablet Take 1 tablet (400 mg total) by mouth at bedtime.    . vitamin B-12 (CYANOCOBALAMIN) 1000 MCG tablet Take 1 tablet (1,000 mcg total) by mouth daily.    . ziprasidone (GEODON) 80 MG capsule Take 3 capsules (240 mg total) by mouth every evening. Take 3  capsules each night at dinner (Patient taking differently: Take 160 mg by mouth every evening. ) 90 capsule 5   No current facility-administered medications on file prior to visit.     Allergies  Allergen Reactions  . Prozac [Fluoxetine Hcl] Swelling  and Rash  . Doxycycline Nausea And Vomiting  . Azithromycin     Reaction not recalled by the patient    Past Medical History:  Diagnosis Date  . Anxiety   . Bipolar 1 disorder [facility])    sees Dr. Candis Schatz psych [phone])  . COPD (chronic obstructive pulmonary disease) ([place]) 03/2013   hyperinflation by CXR  . History of cocaine dependence latest 2017   undergoing detox  . History of stomach ulcers   . Hx: UTI (urinary tract infection)   . Migraine    "stopped in ~ 2008" (03/11/2017)  . Positive PPD 1989   s/p CXR and INH 6 mo  . Seizure ([place]) X 1   "from takiing too many headache medicine?" (03/11/2017)  . Smoker   . Tension headache, chronic     Past Surgical History:  Procedure Laterality Date  . DILATION AND CURETTAGE OF UTERUS  X 1   S/P miscarriage  . ESOPHAGOGASTRODUODENOSCOPY  03/2017   candidal esophagitis, treated, LA grade D esophagitis, 4cm HH (Nandigam)  . LAPAROSCOPIC CHOLECYSTECTOMY  2006  . TUBAL LIGATION  2003    Family History  Problem Relation Age of Onset  . Diabetes Mother        T2DM  . Breast cancer Mother 46  .  Dementia Mother   . Lymphoma Mother   . Coronary artery disease Father 49       MI  . Anxiety disorder Paternal Aunt   . Anxiety disorder Cousin   . Depression Cousin   . ADD / ADHD Child   . Anxiety disorder Child   . Depression Child   . Cancer Neg Hx   . Stroke Neg Hx   . Colon cancer Neg Hx   . Esophageal cancer Neg Hx     Social History   Social History  . Marital status: Divorced    Spouse name: N/A  . Number of children: 2  . Years of education: bachelors   Occupational History  . 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 Smoking status: Current Every Day Smoker    Packs/day: 2.00    Years: 36.00    Types: Cigarettes  . Smokeless tobacco: Never Used  . Alcohol use 0.0 oz/week     Comment: 03/11/2017 "stopped 09/18/2015; never an alcoholic"   . Drug use: No     Comment: 03/11/2017 "stopped 09/18/2015"  . Sexual activity: No   Other Topics Concern  . Not on file   Social History Narrative   Caffeine: 40 oz diet mountain dew/day   Lives with husband and 2 children, no pets         The PMH, PSH, Social History, Family History, Medications, and allergies have been reviewed in [facility], and have been updated if relevant.   Review of Systems  Constitutional: Negative.   HENT: Positive for congestion and sore throat. Negative for dental problem, drooling, ear discharge, ear pain, facial swelling, hearing loss, mouth sores, nosebleeds, postnasal drip, rhinorrhea, sinus pain, sinus pressure and sneezing.   Respiratory: Negative.   Cardiovascular: Negative.   All other systems reviewed and are negative.      Objective:    BP (!) 106/58 (BP Location: Left Arm, Patient Position: Sitting, Cuff Size: Normal)   Pulse (!) 103   Temp 97.8 F (36.6 C) (Oral)   Ht 5\' 2"  (1.575 m)   Wt 115 lb 1.9 oz (52.2 kg)   LMP 01/04/2015   SpO2 99%   BMI 21.06 kg/m    Physical Exam  Constitutional: She is oriented to person, place, and time. She appears well-developed and well-nourished.  HENT:  Head: Normocephalic and atraumatic.  Mouth/Throat: Oropharynx is clear and moist.  Very faint ? Yodice plaque on left buccal mucosa  Eyes: Conjunctivae are normal.  Cardiovascular: Normal rate.   Pulmonary/Chest: Effort normal.  Musculoskeletal: Normal range of motion.  Neurological: She is alert and oriented to person, place, and time. No cranial nerve deficit.  Skin: Skin is warm and dry. She is not diaphoretic.  Psychiatric: She has a normal mood and affect. Her behavior is normal. Judgment and thought content normal.  Nursing note and vitals reviewed.         Assessment & Plan:   Thrush No Follow-up on file.

## 2017-06-23 ENCOUNTER — Telehealth: Payer: Self-pay | Admitting: Family Medicine

## 2017-06-23 NOTE — Telephone Encounter (Signed)
Copied from [place] #[id number]. Topic: Quick Communication - See Telephone Encounter >> Jun 23, 2017  1:48 PM Patrice Paradise wrote: CRM for notification. See Telephone encounter for:  06/23/17. Patient calling for Lab results

## 2017-06-23 NOTE — Telephone Encounter (Signed)
Pt aware/thx dmf 

## 2017-06-23 NOTE — Telephone Encounter (Signed)
This patient is calling about her lab results, B12. Please give her a call.  She is a patient of T. Deborra Medina. Thanks

## 2017-07-28 DIAGNOSIS — S62102A Fracture of unspecified carpal bone, left wrist, initial encounter for closed fracture: Secondary | ICD-10-CM | POA: Diagnosis not present

## 2017-07-28 DIAGNOSIS — S52502A Unspecified fracture of the lower end of left radius, initial encounter for closed fracture: Secondary | ICD-10-CM | POA: Diagnosis not present

## 2017-07-28 DIAGNOSIS — W010XXA Fall on same level from slipping, tripping and stumbling without subsequent striking against object, initial encounter: Secondary | ICD-10-CM | POA: Diagnosis not present

## 2017-08-02 ENCOUNTER — Encounter: Payer: Self-pay | Admitting: Gastroenterology

## 2017-08-03 DIAGNOSIS — S52502D Unspecified fracture of the lower end of left radius, subsequent encounter for closed fracture with routine healing: Secondary | ICD-10-CM | POA: Diagnosis not present

## 2017-08-06 DIAGNOSIS — D2272 Melanocytic nevi of left lower limb, including hip: Secondary | ICD-10-CM | POA: Diagnosis not present

## 2017-08-06 DIAGNOSIS — D2261 Melanocytic nevi of right upper limb, including shoulder: Secondary | ICD-10-CM | POA: Diagnosis not present

## 2017-08-06 DIAGNOSIS — D225 Melanocytic nevi of trunk: Secondary | ICD-10-CM | POA: Diagnosis not present

## 2017-08-06 DIAGNOSIS — D1801 Hemangioma of skin and subcutaneous tissue: Secondary | ICD-10-CM | POA: Diagnosis not present

## 2017-08-06 DIAGNOSIS — L570 Actinic keratosis: Secondary | ICD-10-CM | POA: Diagnosis not present

## 2017-08-06 DIAGNOSIS — L814 Other melanin hyperpigmentation: Secondary | ICD-10-CM | POA: Diagnosis not present

## 2017-08-06 DIAGNOSIS — L821 Other seborrheic keratosis: Secondary | ICD-10-CM | POA: Diagnosis not present

## 2017-08-14 DIAGNOSIS — R1110 Vomiting, unspecified: Secondary | ICD-10-CM | POA: Diagnosis not present

## 2017-08-14 DIAGNOSIS — J029 Acute pharyngitis, unspecified: Secondary | ICD-10-CM | POA: Diagnosis not present

## 2017-08-16 ENCOUNTER — Telehealth: Payer: Self-pay

## 2017-08-16 NOTE — Telephone Encounter (Signed)
Copied from [place] [id number]. Topic: General - Other >> Aug 16, 2017  3:03 PM Carolyn Stare wrote:  Pt call to say she had xrays done and it showed that pulmonary nodule on her right lung . She said they told her to let him know what they found   [phone]

## 2017-08-17 ENCOUNTER — Telehealth: Payer: Self-pay | Admitting: Family Medicine

## 2017-08-17 ENCOUNTER — Ambulatory Visit: Payer: Self-pay | Admitting: *Deleted

## 2017-08-17 NOTE — Telephone Encounter (Signed)
Contacted pt and veriifed that she had received a call back from the office; she verified that she wast told that she needed to be seen in office but she declined; she stated that she "would hold on"; will route conversation to [place]

## 2017-08-17 NOTE — Telephone Encounter (Signed)
Noted. Would see if we can get copy of xray report. Thanks.

## 2017-08-17 NOTE — Telephone Encounter (Signed)
Spoke with pt, says she was going to get it anyway so she will bring it by here.

## 2017-08-17 NOTE — Telephone Encounter (Signed)
Spoke with pt relaying message per Dr. Darnell Level. She says ok but may go on to another UC so she does not have to make appt.

## 2017-08-17 NOTE — Telephone Encounter (Signed)
Pt states that she went to urgent care on Saturday for sore throat and throwing up; she states that they gave her medication for her esophagus, but her throat is still sore; she further states that she "thinks shehas an ulcer in her esophagus and needs an antibiotic"; Pt says that her "strep test was negative but her chest xray had something on it" Pt states that she went to [place]; nurse triage protocol initiated and recommendation made top see PCP within 3 days; pt states that there is not a day this week and would like to know if Dr Danise Mina will give her an antibiotic; will route to [place] pool and call the pt back with recommendations per MD.  Reason for Disposition . [1] Sore throat is the only symptom AND [2] present > 48 hours  Answer Assessment - Initial Assessment Questions 1. ONSET: "When did the throat start hurting?" (Hours or days ago)      3 weeks 2. SEVERITY: "How bad is the sore throat?" (Scale 1-10; mild, moderate or severe)   - MILD (1-3):  doesn't interfere with eating or normal activities   - MODERATE (4-7): interferes with eating some solids and normal activities   - SEVERE (8-10):  excruciating pain, interferes with most normal activities   - SEVERE DYSPHAGIA: can't swallow liquids, drooling     moderate 3. STREP EXPOSURE: "Has there been any exposure to strep within the past week?" If so, ask: "What type of contact occurred?"      Negative throat culture on Saturday December 15  4.  VIRAL SYMPTOMS: "Are there any symptoms of a cold, such as a runny nose, cough, hoarse voice or red eyes?"      no 5. FEVER: "Do you have a fever?" If so, ask: "What is your temperature, how was it measured, and when did it start?"     no 6. PUS ON THE TONSILS: "Is there pus on the tonsils in the back of your throat?"     no 7. OTHER SYMPTOMS: "Do you have any other symptoms?" (e.g., difficulty breathing, headache, rash)     no 8. PREGNANCY: "Is there any chance you  are pregnant?" "When was your last menstrual period?"     No  Protocols used: SORE THROAT-A-AH

## 2017-08-17 NOTE — Telephone Encounter (Signed)
Attempted to contact pt regarding her previous request; see note dated 08/17/17; left message on voice mail to that her request has been sent and someone from office will contact her

## 2017-08-17 NOTE — Telephone Encounter (Signed)
Recommend office visit for evaluation.  Noted CXR findings.

## 2017-08-20 ENCOUNTER — Other Ambulatory Visit: Payer: Self-pay | Admitting: Family Medicine

## 2017-08-29 DIAGNOSIS — R5383 Other fatigue: Secondary | ICD-10-CM | POA: Diagnosis not present

## 2017-08-29 DIAGNOSIS — R112 Nausea with vomiting, unspecified: Secondary | ICD-10-CM | POA: Diagnosis not present

## 2017-08-30 ENCOUNTER — Telehealth: Payer: Self-pay | Admitting: Family Medicine

## 2017-08-30 ENCOUNTER — Telehealth: Payer: Self-pay

## 2017-08-30 NOTE — Telephone Encounter (Signed)
I spoke with pt; pt said throat had been hurting for weeks but now throat is not hurting.Pt states she feels fine today but thinks needs upper and lower endoscopy. Pt was seen at [facility] in [place] for vomiting and h/a. Pt said her iron was low and that made UC think had bleeding in GI tract. Pt said she stopped seeing GI doctor and not sure last seen GI doctor. Per chart pt last saw GI 04/16/17. Pt denies any vomiting of blood and no blood in BM.Please advise.

## 2017-08-30 NOTE — Telephone Encounter (Signed)
Patient called in for antibiotic, see note. Thanks.

## 2017-08-30 NOTE — Telephone Encounter (Signed)
Copied from [place] [phone]. Topic: Referral - Request >> Aug 30, 2017 10:27 AM Synthia Innocent wrote: Reason for CRM: Requesting referral for Upper and lower Endoscopy. States she has a bleeding ulcer.

## 2017-08-30 NOTE — Telephone Encounter (Signed)
Copied from [place] [phone]. Topic: Quick Communication - See Telephone Encounter >> Aug 30, 2017  1:05 PM Arletha Grippe wrote: CRM for notification. See Telephone encounter for:   08/30/17.pt calling - she thinks she has an ulcer in her esophagus. [facility] on [address]. She went to UC and they did not know what was wrong.  Cb [phone]  she would like to have abx called in

## 2017-09-01 ENCOUNTER — Encounter: Payer: Self-pay | Admitting: Family Medicine

## 2017-09-01 ENCOUNTER — Ambulatory Visit: Payer: Self-pay | Admitting: Family Medicine

## 2017-09-01 ENCOUNTER — Other Ambulatory Visit (INDEPENDENT_AMBULATORY_CARE_PROVIDER_SITE_OTHER): Payer: Medicare HMO

## 2017-09-01 ENCOUNTER — Ambulatory Visit (INDEPENDENT_AMBULATORY_CARE_PROVIDER_SITE_OTHER): Payer: Medicare HMO | Admitting: Family Medicine

## 2017-09-01 ENCOUNTER — Other Ambulatory Visit: Payer: Self-pay

## 2017-09-01 ENCOUNTER — Telehealth: Payer: Self-pay

## 2017-09-01 VITALS — BP 102/60 | HR 74 | Temp 97.8°F | Ht 62.0 in | Wt 111.2 lb

## 2017-09-01 DIAGNOSIS — J209 Acute bronchitis, unspecified: Secondary | ICD-10-CM | POA: Diagnosis not present

## 2017-09-01 DIAGNOSIS — D508 Other iron deficiency anemias: Secondary | ICD-10-CM | POA: Diagnosis not present

## 2017-09-01 DIAGNOSIS — E871 Hypo-osmolality and hyponatremia: Secondary | ICD-10-CM

## 2017-09-01 DIAGNOSIS — K2210 Ulcer of esophagus without bleeding: Secondary | ICD-10-CM | POA: Diagnosis not present

## 2017-09-01 DIAGNOSIS — J029 Acute pharyngitis, unspecified: Secondary | ICD-10-CM | POA: Diagnosis not present

## 2017-09-01 DIAGNOSIS — J440 Chronic obstructive pulmonary disease with acute lower respiratory infection: Secondary | ICD-10-CM

## 2017-09-01 LAB — CBC WITH DIFFERENTIAL/PLATELET
BASOS PCT: 0.4 % (ref 0.0–3.0)
Basophils Absolute: 0 10*3/uL (ref 0.0–0.1)
EOS ABS: 0 10*3/uL (ref 0.0–0.7)
EOS PCT: 0.9 % (ref 0.0–5.0)
HEMATOCRIT: 28.4 % — AB (ref 36.0–46.0)
Hemoglobin: 9 g/dL — ABNORMAL LOW (ref 12.0–15.0)
LYMPHS PCT: 25.2 % (ref 12.0–46.0)
Lymphs Abs: 1.3 10*3/uL (ref 0.7–4.0)
MCHC: 31.7 g/dL (ref 30.0–36.0)
MCV: 73.5 fl — ABNORMAL LOW (ref 78.0–100.0)
Monocytes Absolute: 0.5 10*3/uL (ref 0.1–1.0)
Monocytes Relative: 9.7 % (ref 3.0–12.0)
NEUTROS ABS: 3.3 10*3/uL (ref 1.4–7.7)
Neutrophils Relative %: 63.8 % (ref 43.0–77.0)
PLATELETS: 423 10*3/uL — AB (ref 150.0–400.0)
RBC: 3.86 Mil/uL — ABNORMAL LOW (ref 3.87–5.11)
RDW: 18.9 % — AB (ref 11.5–15.5)
WBC: 5.3 10*3/uL (ref 4.0–10.5)

## 2017-09-01 LAB — BASIC METABOLIC PANEL
BUN: 6 mg/dL (ref 6–23)
CALCIUM: 8.4 mg/dL (ref 8.4–10.5)
CO2: 27 mEq/L (ref 19–32)
CREATININE: 0.72 mg/dL (ref 0.40–1.20)
Chloride: 96 mEq/L (ref 96–112)
GFR: 89.72 mL/min (ref >60.00)
Glucose, Bld: 92 mg/dL (ref 70–99)
POTASSIUM: 4 meq/L (ref 3.5–5.1)
Sodium: 129 mEq/L — ABNORMAL LOW (ref 135–145)

## 2017-09-01 MED ORDER — AMOXICILLIN 875 MG PO TABS: 875.0000 mg | ORAL_TABLET | Freq: Two times a day (BID) | ORAL | 0 refills | Status: DC

## 2017-09-01 NOTE — Telephone Encounter (Signed)
She is due for rpt endoscopy per Dr Woodward Ku last notes - she should be able to call and make appointment for this as she was just seen late last year.

## 2017-09-01 NOTE — Addendum Note (Signed)
Addended by: Ellamae Sia on: 09/01/2017 02:17 PM   Modules accepted: Orders

## 2017-09-01 NOTE — Telephone Encounter (Signed)
Pt has appt with Dr Lorelei Pont 09/01/17 at 9 AM.

## 2017-09-01 NOTE — Telephone Encounter (Signed)
Called and spoke with patient informing her of Dr. Synthia Innocent recommendations she states she doesn't know that she wants to do that right now but she will call and schedule an appointment at her convenience.understanding verbalized nothing further needed at this time.

## 2017-09-01 NOTE — Progress Notes (Signed)
Dr. Frederico Hamman T. Tavaria Mackins, MD, [facility] [place], [address] Phone: [phone] Fax: [phone]  09/01/2017  Patient: Robin Welch, MRN: [id number], DOB: February 29, 1966, 52 y.o.  Primary Physician:  Ria Bush, MD   Chief Complaint  Patient presents with  . Throat feels raw    Seen at [facility]-Strep Negative-Thinks she has ulcer in esphogus  . Emesis   Subjective:   Robin Welch is a 52 y.o. very pleasant female patient who presents with the following:  Pleasant lady who is actually been to urgent care twice in the last 6 weeks.  She has been 6 she thinks for about 5 or 6 weeks, she has had a sore throat.  Previously she has had a significant bleeding ulcer in her esophagus, and she was on Protonix, but she did stop this.  She also has had some nausea, as well as some emesis.  She also has had some runny nose and coughing.  Last 5-6 weeks, throat is raw, has had an ulcer in the throat before. Stopped her protonix.   Not throwing up as much.   Past Medical History, Surgical History, Social History, Family History, Problem List, Medications, and Allergies have been reviewed and updated if relevant.  Patient Active Problem List   Diagnosis Date Noted  . Thrush 06/21/2017  . Acute nonintractable headache 03/23/2017  . Abnormal x-ray of lung 03/23/2017  . Hyponatremia 03/11/2017  . Iron deficiency anemia 03/11/2017  . Thrombocytosis ([place]) 03/02/2017  . Non-intractable vomiting with nausea 03/01/2017  . History of cocaine dependence   . Migraine 05/08/2015  . Hoarseness of voice 04/04/2015  . COPD (chronic obstructive pulmonary disease) with chronic bronchitis ([place]) 09/22/2013  . Tobacco abuse 12/18/2010  . Bipolar 1 disorder ([place]) 12/18/2010    Past Medical History:  Diagnosis Date  . Anxiety   . Bipolar 1 disorder [facility])    sees Dr. Candis Schatz psych [phone])  . COPD (chronic obstructive pulmonary  disease) ([place]) 03/2013   hyperinflation by CXR  . History of cocaine dependence latest 2017   undergoing detox  . History of stomach ulcers   . Hx: UTI (urinary tract infection)   . Migraine    "stopped in ~ 2008" (03/11/2017)  . Positive PPD 1989   s/p CXR and INH 6 mo  . Seizure ([place]) X 1   "from takiing too many headache medicine?" (03/11/2017)  . Smoker   . Tension headache, chronic     Past Surgical History:  Procedure Laterality Date  . DILATION AND CURETTAGE OF UTERUS  X 1   S/P miscarriage  . ESOPHAGOGASTRODUODENOSCOPY  03/2017   candidal esophagitis, treated, LA grade D esophagitis, 4cm HH (Nandigam)  . LAPAROSCOPIC CHOLECYSTECTOMY  2006  . TUBAL LIGATION  2003    Social History   Socioeconomic History  . Marital status: Divorced    Spouse name: Not on file  . Number of children: 2  . Years of education: bachelors  . Highest education level: Not on file  Social Needs  . Financial resource strain: Not on file  . Food insecurity - worry: Not on file  . Food insecurity - inability: Not on file  . Transportation needs - medical: Not on file  . Transportation needs - non-medical: Not on file  Occupational History  . 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  Smoking status: Current Every Day Smoker    Packs/day: 2.00    Years: 36.00    Pack years: 72.00    Types: Cigarettes  . Smokeless tobacco: Never Used  Substance and Sexual Activity  . Alcohol use: Yes    Alcohol/week: 0.0 oz    Comment: 03/11/2017 "stopped 09/18/2015; never an alcoholic"  . Drug use: No    Comment: 03/11/2017 "stopped 09/18/2015"  . Sexual activity: No    Birth control/protection: Surgical  Other Topics Concern  . Not on file  Social History Narrative   Caffeine: 40 oz diet mountain dew/day   Lives with husband and 2 children, no pets       Family History  Problem Relation Age of Onset  .  Diabetes Mother        T2DM  . Breast cancer Mother 68  . Dementia Mother   . Lymphoma Mother   . Coronary artery disease Father 27       MI  . Anxiety disorder Paternal Aunt   . Anxiety disorder Cousin   . Depression Cousin   . ADD / ADHD Child   . Anxiety disorder Child   . Depression Child   . Cancer Neg Hx   . Stroke Neg Hx   . Colon cancer Neg Hx   . Esophageal cancer Neg Hx     Allergies  Allergen Reactions  . Prozac [Fluoxetine Hcl] Swelling and Rash  . Doxycycline Nausea And Vomiting  . Azithromycin     Reaction not recalled by the patient    Medication list reviewed and updated in full in [place].  ROS: GEN: Acute illness details above GI: Tolerating PO intake GU: maintaining adequate hydration and urination Pulm: No SOB Interactive and getting along well at home.  Otherwise, ROS is as per the HPI.  Objective:   BP 102/60   Pulse 74   Temp 97.8 F (36.6 C) (Oral)   Ht 5\' 2"  (1.575 m)   Wt 111 lb 4 oz (50.5 kg)   LMP 01/04/2015   SpO2 97%   BMI 20.35 kg/m    GEN: A and O x 3. WDWN. NAD.    ENT: Nose clear, ext NML.  No LAD.  No JVD.  TM's clear. Oropharynx clear.  PULM: Normal WOB, no distress. No crackles, wheezes, rhonchi. CV: RRR, no M/G/R, No rubs, No JVD.   EXT: warm and well-perfused, No c/c/e. PSYCH: Pleasant and conversant.    Laboratory and Imaging Data:  Assessment and Plan:   Acute bronchitis with COPD ([place])  Other iron deficiency anemia  Hyponatremia - Plan: Basic metabolic panel  Ulcer of esophagus without bleeding - Plan: CBC with Differential/Platelet  Sore throat   Given risk factors and COPD, is not unreasonable I think treat this with some antibiotics.  Given the risk of possible ulcer, recommended that the patient restart her PPI.  She also was told that she had some hyponatremia as well as significant anemia and low iron, but she left prior to getting her blood drawn.  Follow-up: No Follow-up on  file.  Meds ordered this encounter  Medications  . amoxicillin (AMOXIL) 875 MG tablet    Sig: Take 1 tablet (875 mg total) by mouth 2 (two) times daily.    Dispense:  20 tablet    Refill:  0   Medications Discontinued During This Encounter  Medication Reason  . vitamin B-12 (CYANOCOBALAMIN) 1000 MCG tablet Completed Course  . nystatin (MYCOSTATIN) 100000 UNIT/ML  suspension Completed Course  . varenicline (CHANTIX CONTINUING MONTH PAK) 1 MG tablet Completed Course   Orders Placed This Encounter  Procedures  . Basic metabolic panel  . CBC with Differential/Platelet    Signed,  Frederico Hamman T. Weda Baumgarner, MD   Allergies as of 09/01/2017      Reactions   Prozac [fluoxetine Hcl] Swelling, Rash   Doxycycline Nausea And Vomiting   Azithromycin    Reaction not recalled by the patient      Medication List        Accurate as of 09/01/17  1:41 PM. Always use your most recent med list.          amoxicillin 875 MG tablet Commonly known as:  AMOXIL Take 1 tablet (875 mg total) by mouth 2 (two) times daily.   busPIRone 15 MG tablet Commonly known as:  BUSPAR Take 2 po qam and 3 po qhs   gabapentin 800 MG tablet Commonly known as:  NEURONTIN Take 800 mg by mouth 4 (four) times daily.   hydrOXYzine 50 MG tablet Commonly known as:  ATARAX/VISTARIL Take 1 tablet (50 mg total) by mouth every 6 (six) hours as needed.   ondansetron 8 MG disintegrating tablet Commonly known as:  ZOFRAN-ODT DIS 1 T ON THE TONGUE QID FOR 5 DAYS PRN   pantoprazole 20 MG tablet Commonly known as:  PROTONIX TAKE 1 TABLET(20 MG) BY MOUTH DAILY   QUEtiapine 400 MG tablet Commonly known as:  SEROQUEL Take 1 tablet (400 mg total) by mouth at bedtime.   VENTOLIN HFA 108 (90 Base) MCG/ACT inhaler Generic drug:  albuterol INHALE 2 PUFFS INTO THE LUNGS EVERY 6 HOURS AS NEEDED FOR WHEEZING OR SHORTNESS OF BREATH   ziprasidone 80 MG capsule Commonly known as:  GEODON Take 3 capsules (240 mg total) by mouth  every evening. Take 3  capsules each night at dinner

## 2017-09-01 NOTE — Telephone Encounter (Signed)
Robin Welch with [facility] said pt seen earlier today and missed call from office; Terri at lab said pt left without getting labs drawn; pt can come back for lab work today. Robin Welch will let pt know.

## 2017-09-13 DIAGNOSIS — S52502D Unspecified fracture of the lower end of left radius, subsequent encounter for closed fracture with routine healing: Secondary | ICD-10-CM | POA: Diagnosis not present

## 2017-09-13 DIAGNOSIS — F3175 Bipolar disorder, in partial remission, most recent episode depressed: Secondary | ICD-10-CM | POA: Diagnosis not present

## 2017-09-27 DIAGNOSIS — F3175 Bipolar disorder, in partial remission, most recent episode depressed: Secondary | ICD-10-CM | POA: Diagnosis not present

## 2017-10-13 ENCOUNTER — Ambulatory Visit (INDEPENDENT_AMBULATORY_CARE_PROVIDER_SITE_OTHER): Payer: Medicare HMO | Admitting: Family Medicine

## 2017-10-13 ENCOUNTER — Encounter: Payer: Self-pay | Admitting: Family Medicine

## 2017-10-13 ENCOUNTER — Other Ambulatory Visit: Payer: Self-pay

## 2017-10-13 VITALS — BP 100/68 | HR 104 | Temp 97.8°F | Ht 62.0 in | Wt 114.0 lb

## 2017-10-13 DIAGNOSIS — J0100 Acute maxillary sinusitis, unspecified: Secondary | ICD-10-CM

## 2017-10-13 MED ORDER — AMOXICILLIN 500 MG PO CAPS: 1000.0000 mg | ORAL_CAPSULE | Freq: Two times a day (BID) | ORAL | 0 refills | Status: AC

## 2017-10-13 NOTE — Progress Notes (Signed)
Dr. Frederico Hamman T. Fotini Lemus, MD, [facility] Primary Care and Sports Medicine [place], [address] Phone: [phone] Fax: [phone]  10/13/2017  Patient: Robin Welch, MRN: [id number], DOB: 1966/03/06, 52 y.o.  Primary Physician:  Ria Bush, MD   Chief Complaint  Patient presents with  . Bronchitis   Subjective:   This 52 y.o. female patient presents with runny nose, sneezing, cough, sore throat, malaise and minimal / low-grade fever for > 1 week. Now the primary complaint has become sinus pressure and pain behind the eyes and in the upper, anterior face.   Face hurts and will have some pain in her head and took some tylenol sinus cold and severe. Really congested this morning. Smokes 2 ppd.   The patent denies sore throat as the primary complaint. Denies sthortness of breath/wheezing, high fever, chest pain, significant myalgia, otalgia, abdominal pain, changes in bowel or bladder.  PMH, PHS, Allergies, Problem List, Medications, Family History, and Social History have all been reviewed.  Patient Active Problem List   Diagnosis Date Noted  . Thrush 06/21/2017  . Acute nonintractable headache 03/23/2017  . Abnormal x-ray of lung 03/23/2017  . Hyponatremia 03/11/2017  . Iron deficiency anemia 03/11/2017  . Thrombocytosis ([place]) 03/02/2017  . Non-intractable vomiting with nausea 03/01/2017  . History of cocaine dependence   . Migraine 05/08/2015  . Hoarseness of voice 04/04/2015  . COPD (chronic obstructive pulmonary disease) with chronic bronchitis ([place]) 09/22/2013  . Tobacco abuse 12/18/2010  . Bipolar 1 disorder ([place]) 12/18/2010    Past Medical History:  Diagnosis Date  . Anxiety   . Bipolar 1 disorder [facility])    sees Dr. Candis Schatz psych [phone])  . COPD (chronic obstructive pulmonary disease) ([place]) 03/2013   hyperinflation by CXR  . History of cocaine dependence latest 2017   undergoing detox  . History of stomach ulcers   . Hx:  UTI (urinary tract infection)   . Migraine    "stopped in ~ 2008" (03/11/2017)  . Positive PPD 1989   s/p CXR and INH 6 mo  . Seizure ([place]) X 1   "from takiing too many headache medicine?" (03/11/2017)  . Smoker   . Tension headache, chronic     Past Surgical History:  Procedure Laterality Date  . DILATION AND CURETTAGE OF UTERUS  X 1   S/P miscarriage  . ESOPHAGOGASTRODUODENOSCOPY  03/2017   candidal esophagitis, treated, LA grade D esophagitis, 4cm HH (Nandigam)  . LAPAROSCOPIC CHOLECYSTECTOMY  2006  . TUBAL LIGATION  2003    Social History   Socioeconomic History  . Marital status: Divorced    Spouse name: Not on file  . Number of children: 2  . Years of education: bachelors  . Highest education level: Not on file  Social Needs  . Financial resource strain: Not on file  . Food insecurity - worry: Not on file  . Food insecurity - inability: Not on file  . Transportation needs - medical: Not on file  . Transportation needs - non-medical: Not on file  Occupational History  . 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 Smoking status: Current Every Day Smoker    Packs/day: 2.00    Years: 36.00    Pack years: 72.00    Types: Cigarettes  . Smokeless tobacco: Never Used  Substance and Sexual Activity  . Alcohol use: Yes  Alcohol/week: 0.0 oz    Comment: 03/11/2017 "stopped 09/18/2015; never an alcoholic"  . Drug use: No    Comment: 03/11/2017 "stopped 09/18/2015"  . Sexual activity: No    Birth control/protection: Surgical  Other Topics Concern  . Not on file  Social History Narrative   Caffeine: 40 oz diet mountain dew/day   Lives with husband and 2 children, no pets       Family History  Problem Relation Age of Onset  . Diabetes Mother        T2DM  . Breast cancer Mother 83  . Dementia Mother   . Lymphoma Mother   . Coronary artery disease Father 44       MI  .  Anxiety disorder Paternal Aunt   . Anxiety disorder Cousin   . Depression Cousin   . ADD / ADHD Child   . Anxiety disorder Child   . Depression Child   . Cancer Neg Hx   . Stroke Neg Hx   . Colon cancer Neg Hx   . Esophageal cancer Neg Hx     Allergies  Allergen Reactions  . Prozac [Fluoxetine Hcl] Swelling and Rash  . Doxycycline Nausea And Vomiting  . Azithromycin     Reaction not recalled by the patient    Medication list reviewed and updated in full in [place].  ROS as above, eating and drinking - tolerating PO. Urinating normally. No excessive vomitting or diarrhea. O/w as above.  Objective:   Blood pressure 100/68, pulse (!) 104, temperature 97.8 F (36.6 C), temperature source Oral, height 5\' 2"  (1.575 m), weight 114 lb (51.7 kg), last menstrual period 01/04/2015, SpO2 99 %.  GEN: WDWN, Non-toxic, Atraumatic, normocephalic. A and O x 3. HEENT: Oropharynx clear without exudate, MMM, no significant LAD, mild rhinnorhea Sinuses: Right Frontal, ethmoid, and maxillary: Tender Left Frontal, Ethmoid, and maxillary: Tender Ears: TM clear, COL visualized with good landmarks CV: RRR, no m/g/r. Pulm: CTA B, no wheezes, rhonchi, or crackles, normal respiratory effort. EXT: no c/c/e Psych: well oriented, neither depressed nor anxious in appearance  Assessment and Plan:   Acute non-recurrent maxillary sinusitis  Acute sinusitis: ABX as below.   Reviewed symptomatic care as well as ABX in this case.    Follow-up: No Follow-up on file.  Meds ordered this encounter  Medications  . amoxicillin (AMOXIL) 500 MG capsule    Sig: Take 2 capsules (1,000 mg total) by mouth 2 (two) times daily for 10 days.    Dispense:  40 capsule    Refill:  0   Signed,  Meng Winterton T. Searra Carnathan, MD  Patient's Medications  New Prescriptions   AMOXICILLIN (AMOXIL) 500 MG CAPSULE    Take 2 capsules (1,000 mg total) by mouth 2 (two) times daily for 10 days.  Previous Medications    BUSPIRONE (BUSPAR) 30 MG TABLET       GABAPENTIN (NEURONTIN) 800 MG TABLET    Take 800 mg by mouth 4 (four) times daily.    HYDROXYZINE (ATARAX/VISTARIL) 25 MG TABLET       OLANZAPINE (ZYPREXA) 15 MG TABLET    TK 2 TS PO QHS   PANTOPRAZOLE (PROTONIX) 20 MG TABLET    TAKE 1 TABLET(20 MG) BY MOUTH DAILY   QUETIAPINE (SEROQUEL) 400 MG TABLET    Take 1 tablet (400 mg total) by mouth at bedtime.   SF 5000 PLUS 1.1 % CREA DENTAL CREAM    BRUSH TEETH WITH SMALL AMOUNT BID   VENTOLIN  HFA 108 (90 BASE) MCG/ACT INHALER    INHALE 2 PUFFS INTO THE LUNGS EVERY 6 HOURS AS NEEDED FOR WHEEZING OR SHORTNESS OF BREATH   ZIPRASIDONE (GEODON) 60 MG CAPSULE    Take 60 mg by mouth every evening.  Modified Medications   No medications on file  Discontinued Medications   AMOXICILLIN (AMOXIL) 875 MG TABLET    Take 1 tablet (875 mg total) by mouth 2 (two) times daily.   BUSPIRONE (BUSPAR) 15 MG TABLET    Take 2 po qam and 3 po qhs   HYDROXYZINE (ATARAX/VISTARIL) 50 MG TABLET    Take 1 tablet (50 mg total) by mouth every 6 (six) hours as needed.   ONDANSETRON (ZOFRAN-ODT) 8 MG DISINTEGRATING TABLET    DIS 1 T ON THE TONGUE QID FOR 5 DAYS PRN   ZIPRASIDONE (GEODON) 80 MG CAPSULE    Take 3 capsules (240 mg total) by mouth every evening. Take 3  capsules each night at dinner

## 2017-11-01 DIAGNOSIS — H16142 Punctate keratitis, left eye: Secondary | ICD-10-CM | POA: Diagnosis not present

## 2017-11-01 DIAGNOSIS — H18822 Corneal disorder due to contact lens, left eye: Secondary | ICD-10-CM | POA: Diagnosis not present

## 2017-11-06 ENCOUNTER — Other Ambulatory Visit (HOSPITAL_COMMUNITY): Payer: Self-pay | Admitting: Psychiatry

## 2017-11-09 DIAGNOSIS — F3175 Bipolar disorder, in partial remission, most recent episode depressed: Secondary | ICD-10-CM | POA: Diagnosis not present

## 2017-12-23 DIAGNOSIS — F3175 Bipolar disorder, in partial remission, most recent episode depressed: Secondary | ICD-10-CM | POA: Diagnosis not present

## 2018-01-17 ENCOUNTER — Other Ambulatory Visit: Payer: Self-pay | Admitting: Family Medicine

## 2018-01-25 ENCOUNTER — Ambulatory Visit: Payer: Self-pay | Admitting: Family Medicine

## 2018-01-26 DIAGNOSIS — H40033 Anatomical narrow angle, bilateral: Secondary | ICD-10-CM | POA: Diagnosis not present

## 2018-01-26 DIAGNOSIS — H2513 Age-related nuclear cataract, bilateral: Secondary | ICD-10-CM | POA: Diagnosis not present

## 2018-01-26 DIAGNOSIS — H04123 Dry eye syndrome of bilateral lacrimal glands: Secondary | ICD-10-CM | POA: Diagnosis not present

## 2018-01-27 DIAGNOSIS — Z0100 Encounter for examination of eyes and vision without abnormal findings: Secondary | ICD-10-CM | POA: Diagnosis not present

## 2018-01-31 ENCOUNTER — Encounter: Payer: Self-pay | Admitting: Family Medicine

## 2018-01-31 ENCOUNTER — Ambulatory Visit (INDEPENDENT_AMBULATORY_CARE_PROVIDER_SITE_OTHER): Payer: Medicare HMO | Admitting: Family Medicine

## 2018-01-31 VITALS — BP 120/70 | HR 102 | Temp 97.8°F | Ht 62.0 in | Wt 120.2 lb

## 2018-01-31 DIAGNOSIS — K2100 Gastro-esophageal reflux disease with esophagitis, without bleeding: Secondary | ICD-10-CM

## 2018-01-31 DIAGNOSIS — R1084 Generalized abdominal pain: Secondary | ICD-10-CM | POA: Diagnosis not present

## 2018-01-31 LAB — H. PYLORI ANTIBODY, IGG: H PYLORI IGG: NEGATIVE

## 2018-01-31 MED ORDER — PANTOPRAZOLE SODIUM 20 MG PO TBEC
DELAYED_RELEASE_TABLET | ORAL | 3 refills | Status: DC
Start: 2018-01-31 — End: 2018-01-31

## 2018-01-31 MED ORDER — PANTOPRAZOLE SODIUM 20 MG PO TBEC: 20.0000 mg | DELAYED_RELEASE_TABLET | Freq: Two times a day (BID) | ORAL | 0 refills | Status: DC

## 2018-01-31 MED ORDER — ONDANSETRON 4 MG PO TBDP: 4.0000 mg | ORAL_TABLET | Freq: Three times a day (TID) | ORAL | 2 refills | Status: DC | PRN

## 2018-01-31 NOTE — Progress Notes (Signed)
Dr. Frederico Hamman T. Roman Dubuc, MD, [facility] Primary Care and Sports Medicine [place], [address] Phone: [phone] Fax: [phone]  01/31/2018  Patient: Robin Welch, MRN: [id number], DOB: 06/17/1966, 52 y.o.  Primary Physician:  Ria Bush, MD   Chief Complaint  Patient presents with  . Emesis  . Mouth Lesions    Esophagus  . Nausea  . Sore Throat   Subjective:   Robin Welch is a 52 y.o. very pleasant female patient who presents with the following:  Feels like her esophagus is raw and throwing up some tea. Thinks this could be a recurrence of her prior ulcer, but she does not want to go to GI at all secondary to finances.  She is having very significant GERD now, some upper abdominal discomfort as well as some problems after eating.  She is having some nausea at the same time.  Ulcer?  nausea  Past Medical History, Surgical History, Social History, Family History, Problem List, Medications, and Allergies have been reviewed and updated if relevant.  Patient Active Problem List   Diagnosis Date Noted  . Thrush 06/21/2017  . Acute nonintractable headache 03/23/2017  . Abnormal x-ray of lung 03/23/2017  . Hyponatremia 03/11/2017  . Iron deficiency anemia 03/11/2017  . Thrombocytosis ([place]) 03/02/2017  . Non-intractable vomiting with nausea 03/01/2017  . History of cocaine dependence   . Migraine 05/08/2015  . Hoarseness of voice 04/04/2015  . COPD (chronic obstructive pulmonary disease) with chronic bronchitis ([place]) 09/22/2013  . Tobacco abuse 12/18/2010  . Bipolar 1 disorder ([place]) 12/18/2010    Past Medical History:  Diagnosis Date  . Anxiety   . Bipolar 1 disorder [facility])    sees Dr. Candis Schatz psych [phone])  . COPD (chronic obstructive pulmonary disease) ([place]) 03/2013   hyperinflation by CXR  . History of cocaine dependence latest 2017   undergoing detox  . History of stomach ulcers   . Hx: UTI (urinary tract infection)   .  Migraine    "stopped in ~ 2008" (03/11/2017)  . Positive PPD 1989   s/p CXR and INH 6 mo  . Seizure ([place]) X 1   "from takiing too many headache medicine?" (03/11/2017)  . Smoker   . Tension headache, chronic     Past Surgical History:  Procedure Laterality Date  . DILATION AND CURETTAGE OF UTERUS  X 1   S/P miscarriage  . ESOPHAGOGASTRODUODENOSCOPY  03/2017   candidal esophagitis, treated, LA grade D esophagitis, 4cm HH (Nandigam)  . LAPAROSCOPIC CHOLECYSTECTOMY  2006  . TUBAL LIGATION  2003    Social History   Socioeconomic History  . Marital status: Divorced    Spouse name: Not on file  . Number of children: 2  . Years of education: bachelors  . Highest education level: Not on file  Occupational History  . 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 Financial resource strain: Not on file  . Food insecurity:    Worry: Not on file    Inability: Not on file  . Transportation needs:    Medical: Not on file    Non-medical: Not on file  Tobacco Use  . Smoking status: Current Every Day Smoker    Packs/day: 2.00    Years: 36.00    Pack years: 72.00    Types: Cigarettes  . Smokeless tobacco: Never Used  Substance and Sexual Activity  .  Alcohol use: Yes    Alcohol/week: 0.0 oz    Comment: 03/11/2017 "stopped 09/18/2015; never an alcoholic"  . Drug use: No    Comment: 03/11/2017 "stopped 09/18/2015"  . Sexual activity: Never    Birth control/protection: Surgical  Lifestyle  . Physical activity:    Days per week: Not on file    Minutes per session: Not on file  . Stress: Not on file  Relationships  . Social connections:    Talks on phone: Not on file    Gets together: Not on file    Attends religious service: Not on file    Active member of club or organization: Not on file    Attends meetings of clubs or organizations: Not on file    Relationship status: Not on file  . Intimate  partner violence:    Fear of current or ex partner: Not on file    Emotionally abused: Not on file    Physically abused: Not on file    Forced sexual activity: Not on file  Other Topics Concern  . Not on file  Social History Narrative   Caffeine: 40 oz diet mountain dew/day   Lives with husband and 2 children, no pets       Family History  Problem Relation Age of Onset  . Diabetes Mother        T2DM  . Breast cancer Mother 57  . Dementia Mother   . Lymphoma Mother   . Coronary artery disease Father 1       MI  . Anxiety disorder Paternal Aunt   . Anxiety disorder Cousin   . Depression Cousin   . ADD / ADHD Child   . Anxiety disorder Child   . Depression Child   . Cancer Neg Hx   . Stroke Neg Hx   . Colon cancer Neg Hx   . Esophageal cancer Neg Hx     Allergies  Allergen Reactions  . Prozac [Fluoxetine Hcl] Swelling and Rash  . Doxycycline Nausea And Vomiting  . Azithromycin     Reaction not recalled by the patient    Medication list reviewed and updated in full in [place].   GEN: No acute illnesses, no fevers, chills. GI: No n/v/d, eating normally Pulm: No SOB Interactive and getting along well at home.  Otherwise, ROS is as per the HPI.  Objective:   BP 120/70   Pulse (!) 102   Temp 97.8 F (36.6 C) (Oral)   Ht 5\' 2"  (1.575 m)   Wt 120 lb 4 oz (54.5 kg)   LMP 01/04/2015   BMI 21.99 kg/m   GEN: WDWN, NAD, Non-toxic, A & O x 3 HEENT: Atraumatic, Normocephalic. Neck supple. No masses, No LAD. Ears and Nose: No external deformity. CV: RRR, No M/G/R. No JVD. No thrill. No extra heart sounds. PULM: CTA B, no wheezes, crackles, rhonchi. No retractions. No resp. distress. No accessory muscle use. ABD: S, NT, ND, + BS, No rebound, No HSM  EXTR: No c/c/e NEURO Normal gait.  PSYCH: Normally interactive. Conversant. Not depressed or anxious appearing.  Calm demeanor.   Laboratory and Imaging Data: Results for orders placed or performed in  visit on 01/31/18  H. pylori antibody, IgG  Result Value Ref Range   H Pylori IgG Negative Negative     Assessment and Plan:   Gastroesophageal reflux disease with esophagitis - Plan: H. pylori antibody, IgG  Abdominal pain, generalized - Plan: H. pylori antibody, IgG  She certainly has reflux, and she has some history of ulcer in the past, cannot exclude ulcer versus gastritis.  I am to place her on twice daily Protonix. GI if worsens.  She does not have H. Pylori.  She asked multiple times for antibiotic, but there is no clinical indication.  Follow-up: No follow-ups on file.  Meds ordered this encounter  Medications  . DISCONTD: pantoprazole (PROTONIX) 20 MG tablet    Sig: TAKE 1 TABLET(20 MG) BY MOUTH DAILY    Dispense:  30 tablet    Refill:  3  . ondansetron (ZOFRAN-ODT) 4 MG disintegrating tablet    Sig: Take 1 tablet (4 mg total) by mouth every 8 (eight) hours as needed for nausea or vomiting.    Dispense:  20 tablet    Refill:  2  . pantoprazole (PROTONIX) 20 MG tablet    Sig: Take 1 tablet (20 mg total) by mouth 2 (two) times daily. TAKE 1 TABLET(20 MG) BY MOUTH DAILY    Dispense:  60 tablet    Refill:  0   Orders Placed This Encounter  Procedures  . H. pylori antibody, IgG    Signed,  Meeghan Skipper T. Melisssa Donner, MD   Allergies as of 01/31/2018      Reactions   Prozac [fluoxetine Hcl] Swelling, Rash   Doxycycline Nausea And Vomiting   Azithromycin    Reaction not recalled by the patient      Medication List        Accurate as of 01/31/18 11:59 PM. Always use your most recent med list.          busPIRone 30 MG tablet Commonly known as:  BUSPAR   gabapentin 800 MG tablet Commonly known as:  NEURONTIN Take 800 mg by mouth 4 (four) times daily.   hydrOXYzine 25 MG tablet Commonly known as:  ATARAX/VISTARIL TAKE TWO TABLETS BY MOUTH EVERY MORNING, TWO tablets midday AND ONE tablet AT BEDTIME   OLANZapine 15 MG tablet Commonly known as:  ZYPREXA TK 1 TS  PO QHS   ondansetron 4 MG disintegrating tablet Commonly known as:  ZOFRAN-ODT Take 1 tablet (4 mg total) by mouth every 8 (eight) hours as needed for nausea or vomiting.   pantoprazole 20 MG tablet Commonly known as:  PROTONIX Take 1 tablet (20 mg total) by mouth 2 (two) times daily. TAKE 1 TABLET(20 MG) BY MOUTH DAILY   QUEtiapine 400 MG tablet Commonly known as:  SEROQUEL Take 1 tablet (400 mg total) by mouth at bedtime.   SF 5000 PLUS 1.1 % Crea dental cream Generic drug:  sodium fluoride BRUSH TEETH WITH SMALL AMOUNT BID   tobramycin 0.3 % ophthalmic solution Commonly known as:  TOBREX Use 1 drop into both eyes four times a day   VENTOLIN HFA 108 (90 Base) MCG/ACT inhaler Generic drug:  albuterol INHALE TWO puffs into THE lungs EVERY SIX HOURS AS NEEDED wheezind OR SHORTNESS OF BREATH   ziprasidone 80 MG capsule Commonly known as:  GEODON Take 80 mg by mouth 2 (two) times daily with a meal.

## 2018-02-03 ENCOUNTER — Ambulatory Visit: Payer: Medicare HMO | Admitting: Family Medicine

## 2018-02-08 ENCOUNTER — Ambulatory Visit: Payer: Medicare HMO | Admitting: Family Medicine

## 2018-02-10 ENCOUNTER — Ambulatory Visit: Payer: Medicare HMO | Admitting: Family Medicine

## 2018-02-14 ENCOUNTER — Other Ambulatory Visit: Payer: Self-pay | Admitting: Family Medicine

## 2018-02-14 NOTE — Telephone Encounter (Signed)
Copied from [place] [id number]. Topic: Quick Communication - Rx Refill/Question >> Feb 14, 2018  1:36 PM Cecelia Byars, NT wrote: Medication:  pantoprazole PROTONIX 20 MG tablet  Has the patient contacted their pharmacy? yes  Agent: If no, request that the patient contact the pharmacy for the refill. Agent: If yes, when and what did the pharmacy advise? Preferred Pharmacy (with phone number or street name [facility], [place], [place] - [address] [phone] Phone [phone] Fax  There are no  refill for  the prescription with instructions for taking the above medication 1 tablet (20 mg total) by mouth 2 (two) times daily    Agent: Please be advised that RX refills may take up to 3 business days. We ask that you follow-up with your pharmacy.

## 2018-02-15 DIAGNOSIS — F3175 Bipolar disorder, in partial remission, most recent episode depressed: Secondary | ICD-10-CM | POA: Diagnosis not present

## 2018-02-15 MED ORDER — PANTOPRAZOLE SODIUM 20 MG PO TBEC: 20.0000 mg | DELAYED_RELEASE_TABLET | Freq: Two times a day (BID) | ORAL | 0 refills | Status: DC

## 2018-02-15 NOTE — Telephone Encounter (Signed)
Please call pharmacy to correct SIG- has 2 instructions on it.

## 2018-02-15 NOTE — Telephone Encounter (Signed)
I called [facility] and spoke with Madelynn Done and took off instruction of take one tab daily for protonix 20 mg per 01/31/18 office note. Madelynn Done voiced understanding and med list corrected. Pt notified done and pt voiced understanding.

## 2018-03-25 ENCOUNTER — Ambulatory Visit: Payer: Self-pay

## 2018-03-25 NOTE — Telephone Encounter (Signed)
Pt. Reports she has "been feeling bad for about a month" but has noticed increased shortness of breath with exertion. Denies any other symptoms - denies chest pain. States she is out of town and request an appointment Monday. Reason for Disposition . [1] MODERATE longstanding difficulty breathing (e.g., speaks in phrases, SOB even at rest, pulse 100-120) AND [2] SAME as normal  Answer Assessment - Initial Assessment Questions 1. RESPIRATORY STATUS: "Describe your breathing?" (e.g., wheezing, shortness of breath, unable to speak, severe coughing)      Shortness of breath 2. ONSET: "When did this breathing problem begin?"      1 week 3. PATTERN "Does the difficult breathing come and go, or has it been constant since it started?"      Getting worse 4. SEVERITY: "How bad is your breathing?" (e.g., mild, moderate, severe)    - MILD: No SOB at rest, mild SOB with walking, speaks normally in sentences, can lay down, no retractions, pulse < 100.    - MODERATE: SOB at rest, SOB with minimal exertion and prefers to sit, cannot lie down flat, speaks in phrases, mild retractions, audible wheezing, pulse 100-120.    - SEVERE: Very SOB at rest, speaks in single words, struggling to breathe, sitting hunched forward, retractions, pulse > 120      Mild 5. RECURRENT SYMPTOM: "Have you had difficulty breathing before?" If so, ask: "When was the last time?" and "What happened that time?"      No 6. CARDIAC HISTORY: "Do you have any history of heart disease?" (e.g., heart attack, angina, bypass surgery, angioplasty)      No 7. LUNG HISTORY: "Do you have any history of lung disease?"  (e.g., pulmonary embolus, asthma, emphysema)     COPD 8. CAUSE: "What do you think is causing the breathing problem?"      COPD 9. OTHER SYMPTOMS: "Do you have any other symptoms? (e.g., dizziness, runny nose, cough, chest pain, fever)     Memory loss 10. PREGNANCY: "Is there any chance you are pregnant?" "When was your last  menstrual period?"       No 11. TRAVEL: "Have you traveled out of the country in the last month?" (e.g., travel history, exposures)       No  Protocols used: BREATHING DIFFICULTY-A-AH

## 2018-03-28 ENCOUNTER — Ambulatory Visit (INDEPENDENT_AMBULATORY_CARE_PROVIDER_SITE_OTHER): Payer: Medicare HMO | Admitting: Family Medicine

## 2018-03-28 ENCOUNTER — Telehealth: Payer: Self-pay | Admitting: Family Medicine

## 2018-03-28 ENCOUNTER — Encounter: Payer: Self-pay | Admitting: Family Medicine

## 2018-03-28 VITALS — BP 118/70 | HR 104 | Temp 97.8°F | Ht 62.0 in | Wt 123.0 lb

## 2018-03-28 DIAGNOSIS — J449 Chronic obstructive pulmonary disease, unspecified: Secondary | ICD-10-CM

## 2018-03-28 DIAGNOSIS — K920 Hematemesis: Secondary | ICD-10-CM | POA: Diagnosis not present

## 2018-03-28 DIAGNOSIS — D509 Iron deficiency anemia, unspecified: Secondary | ICD-10-CM

## 2018-03-28 DIAGNOSIS — Z720 Tobacco use: Secondary | ICD-10-CM

## 2018-03-28 DIAGNOSIS — F319 Bipolar disorder, unspecified: Secondary | ICD-10-CM

## 2018-03-28 DIAGNOSIS — R112 Nausea with vomiting, unspecified: Secondary | ICD-10-CM

## 2018-03-28 LAB — CBC WITH DIFFERENTIAL/PLATELET
BASOS ABS: 0 10*3/uL (ref 0.0–0.1)
Basophils Relative: 0.4 % (ref 0.0–3.0)
EOS ABS: 0.1 10*3/uL (ref 0.0–0.7)
Eosinophils Relative: 0.7 % (ref 0.0–5.0)
HCT: 24.4 % — ABNORMAL LOW (ref 36.0–46.0)
Hemoglobin: 7.9 g/dL — CL (ref 12.0–15.0)
Lymphocytes Relative: 16.5 % (ref 12.0–46.0)
Lymphs Abs: 1.6 10*3/uL (ref 0.7–4.0)
MCHC: 32.2 g/dL (ref 30.0–36.0)
MCV: 69 fl — ABNORMAL LOW (ref 78.0–100.0)
MONO ABS: 1.1 10*3/uL — AB (ref 0.1–1.0)
Monocytes Relative: 11.9 % (ref 3.0–12.0)
Neutro Abs: 6.7 10*3/uL (ref 1.4–7.7)
Neutrophils Relative %: 70.5 % (ref 43.0–77.0)
Platelets: 530 10*3/uL — ABNORMAL HIGH (ref 150.0–400.0)
RBC: 3.54 Mil/uL — AB (ref 3.87–5.11)
RDW: 17.3 % — ABNORMAL HIGH (ref 11.5–15.5)
WBC: 9.5 10*3/uL (ref 4.0–10.5)

## 2018-03-28 LAB — COMPREHENSIVE METABOLIC PANEL
ALBUMIN: 3.6 g/dL (ref 3.5–5.2)
ALK PHOS: 78 U/L (ref 39–117)
ALT: 10 U/L (ref 0–35)
AST: 13 U/L (ref 0–37)
BILIRUBIN TOTAL: 0.3 mg/dL (ref 0.2–1.2)
BUN: 5 mg/dL — AB (ref 6–23)
CO2: 30 mEq/L (ref 19–32)
Calcium: 8.5 mg/dL (ref 8.4–10.5)
Chloride: 86 mEq/L — ABNORMAL LOW (ref 96–112)
Creatinine, Ser: 0.72 mg/dL (ref 0.40–1.20)
GFR: 89.53 mL/min (ref >60.00)
GLUCOSE: 90 mg/dL (ref 70–99)
Potassium: 3.5 mEq/L (ref 3.5–5.1)
SODIUM: 124 meq/L — AB (ref 135–145)
TOTAL PROTEIN: 6.4 g/dL (ref 6.0–8.3)

## 2018-03-28 LAB — TSH: TSH: 0.94 u[IU]/mL (ref 0.35–4.50)

## 2018-03-28 LAB — FERRITIN: Ferritin: 6 ng/mL — ABNORMAL LOW (ref 10.0–291.0)

## 2018-03-28 MED ORDER — ONDANSETRON 4 MG PO TBDP: 4.0000 mg | ORAL_TABLET | Freq: Three times a day (TID) | ORAL | 2 refills | Status: DC | PRN

## 2018-03-28 MED ORDER — SUCRALFATE 1 G PO TABS: 1.0000 g | ORAL_TABLET | Freq: Three times a day (TID) | ORAL | 0 refills | Status: DC

## 2018-03-28 MED ORDER — PANTOPRAZOLE SODIUM 20 MG PO TBEC
20.0000 mg | DELAYED_RELEASE_TABLET | Freq: Two times a day (BID) | ORAL | 6 refills | Status: DC
Start: 2018-03-28 — End: 2018-04-19

## 2018-03-28 MED ORDER — TIOTROPIUM BROMIDE MONOHYDRATE 2.5 MCG/ACT IN AERS: 1.0000 | INHALATION_SPRAY | Freq: Every day | RESPIRATORY_TRACT | 6 refills | Status: DC

## 2018-03-28 NOTE — Patient Instructions (Addendum)
protonix refilled to take twice daily, zofran refilled.  Take carafate medicine to coat stomach.  I will refer you back to Dr Silver[facility] for further evaluation. Start spiriva inhaler once daily.  Return in 1 month for spirometry breathing test to best evaluate degree of COPD.

## 2018-03-28 NOTE — Progress Notes (Signed)
BP 118/70 (BP Location: Left Arm, Patient Position: Sitting, Cuff Size: Normal)   Pulse (!) 104   Temp 97.8 F (36.6 C) (Oral)   Ht 5\' 2"  (1.575 m)   Wt 123 lb (55.8 kg)   LMP 01/04/2015   SpO2 98%   BMI 22.50 kg/m    CC: dyspnea, nausea Subjective:    Patient ID: Robin Welch, female    DOB: 1965-12-20, 52 y.o.   MRN: [id number]  HPI: Robin Welch is a 52 y.o. female presenting on 03/28/2018 for Shortness of Breath (C/o SOB for 1 mo.  H/o COPD.  Pt states she has had 2 COPD meds [not sure of names] in the past but stopped taking them. Requesting to resume. ) and Nausea (C/o nausea and has had vomited blood twice in last 2 wks. )   I last saw patient 02/2017. Has seen other providers at our office since.  Pneumonia 7/018 on CXR and CT - fully resolved on repeat imaging.  Saw GI last year s/p EGD for thickened esophagus on CT chest. EGD showed 4cm HH and candidal esophagitis treated with 2 wk course of fluconazole 100mg  daily. Symptoms did improve. She did not return for scheduled colonoscopy. Did not follow up with GI.   Staying nauseated, vomiting almost daily. Has thrown up blood x2. Only taking pantoprazole once daily - feels better when she takes twice daily. Throat feels raw. Breakthrough symptoms if she misses PPI for 2 days. Denies abdominal pain with this. Ongoing over the past month.   Requests blood work today.  Thinks staying well hydrated. Drinking grape juice, water, diet mt dew.   She is taking seroquel, geodon, and zyprexa Thayer Headings NP at [facility]). She also takes buspar.   Worsening COPD - using albuterol inhaler 4 puffs in the mornings. Requests daily controller medicine. States spiriva was not effective.  Current smoker - 1 ppd. Having trouble quitting smoking. Considering hypnosis. Chantix caused mouth to swell.  Alcohol - none rec drugs - denies  Relevant past medical, surgical, family and social history reviewed and updated as indicated.  Interim medical history since our last visit reviewed. Allergies and medications reviewed and updated. Outpatient Medications Prior to Visit  Medication Sig Dispense Refill  . busPIRone (BUSPAR) 30 MG tablet     . gabapentin (NEURONTIN) 800 MG tablet Take 800 mg by mouth 4 (four) times daily.     . hydrOXYzine (ATARAX/VISTARIL) 25 MG tablet TAKE TWO TABLETS BY MOUTH EVERY MORNING, TWO tablets midday AND ONE tablet AT BEDTIME 450 tablet 3  . OLANZapine (ZYPREXA) 15 MG tablet TK 1 TS PO QHS  1  . QUEtiapine (SEROQUEL) 400 MG tablet Take 1 tablet (400 mg total) by mouth at bedtime.    . SF 5000 PLUS 1.1 % CREA dental cream BRUSH TEETH WITH SMALL AMOUNT BID  2  . VENTOLIN HFA 108 (90 Base) MCG/ACT inhaler INHALE TWO puffs into THE lungs EVERY SIX HOURS AS NEEDED wheezind OR SHORTNESS OF BREATH 18 g 3  . ziprasidone (GEODON) 80 MG capsule Take 80 mg by mouth daily.     . ondansetron (ZOFRAN-ODT) 4 MG disintegrating tablet Take 1 tablet (4 mg total) by mouth every 8 (eight) hours as needed for nausea or vomiting. 20 tablet 2  . pantoprazole (PROTONIX) 20 MG tablet Take 1 tablet (20 mg total) by mouth 2 (two) times daily. 60 tablet 0  . tobramycin (TOBREX) 0.3 % ophthalmic solution Use 1 drop into both eyes four  times a day  0   No facility-administered medications prior to visit.      Per HPI unless specifically indicated in ROS section below Review of Systems     Objective:    BP 118/70 (BP Location: Left Arm, Patient Position: Sitting, Cuff Size: Normal)   Pulse (!) 104   Temp 97.8 F (36.6 C) (Oral)   Ht 5\' 2"  (1.575 m)   Wt 123 lb (55.8 kg)   LMP 01/04/2015   SpO2 98%   BMI 22.50 kg/m   Wt Readings from Last 3 Encounters:  03/28/18 123 lb (55.8 kg)  01/31/18 120 lb 4 oz (54.5 kg)  10/13/17 114 lb (51.7 kg)    Physical Exam  Constitutional: She appears well-developed and well-nourished. No distress.  HENT:  Mouth/Throat: Oropharynx is clear and moist. No oropharyngeal exudate.   Eyes: Pupils are equal, round, and reactive to light. EOM are normal.  Neck: Normal range of motion. Neck supple.  Cardiovascular: Regular rhythm and normal heart sounds. Tachycardia present.  No murmur heard. Pulmonary/Chest: Effort normal and breath sounds normal. No respiratory distress. She has no wheezes. She has no rales.  Coarse at bases Loud breathing  Abdominal: Soft. Bowel sounds are normal. She exhibits no distension and no mass. There is no tenderness. There is no rebound and no guarding. No hernia.  Musculoskeletal: She exhibits no edema.  Lymphadenopathy:    She has no cervical adenopathy.  Skin: Skin is warm and dry. Capillary refill takes less than 2 seconds.  Nursing note and vitals reviewed.  Results for orders placed or performed in visit on 03/28/18  Comprehensive metabolic panel  Result Value Ref Range   Sodium 124 (L) 135 - 145 mEq/L   Potassium 3.5 3.5 - 5.1 mEq/L   Chloride 86 (L) 96 - 112 mEq/L   CO2 30 19 - 32 mEq/L   Glucose, Bld 90 70 - 99 mg/dL   BUN 5 (L) 6 - 23 mg/dL   Creatinine, Ser 0.72 0.40 - 1.20 mg/dL   Total Bilirubin 0.3 0.2 - 1.2 mg/dL   Alkaline Phosphatase 78 39 - 117 U/L   AST 13 0 - 37 U/L   ALT 10 0 - 35 U/L   Total Protein 6.4 6.0 - 8.3 g/dL   Albumin 3.6 3.5 - 5.2 g/dL   Calcium 8.5 8.4 - 10.5 mg/dL   GFR 89.53 >60.00 mL/min  CBC with Differential/Platelet  Result Value Ref Range   WBC 9.5 4.0 - 10.5 K/uL   RBC 3.54 (L) 3.87 - 5.11 Mil/uL   Hemoglobin 7.9 Repeated and verified X2. (LL) 12.0 - 15.0 g/dL   HCT 24.4 (L) 36.0 - 46.0 %   MCV 69.0 (L) 78.0 - 100.0 fl   MCHC 32.2 30.0 - 36.0 g/dL   RDW 17.3 (H) 11.5 - 15.5 %   Platelets 530.0 (H) 150.0 - 400.0 K/uL   Neutrophils Relative % 70.5 43.0 - 77.0 %   Lymphocytes Relative 16.5 12.0 - 46.0 %   Monocytes Relative 11.9 3.0 - 12.0 %   Eosinophils Relative 0.7 0.0 - 5.0 %   Basophils Relative 0.4 0.0 - 3.0 %   Neutro Abs 6.7 1.4 - 7.7 K/uL   Lymphs Abs 1.6 0.7 - 4.0 K/uL    Monocytes Absolute 1.1 (H) 0.1 - 1.0 K/uL   Eosinophils Absolute 0.1 0.0 - 0.7 K/uL   Basophils Absolute 0.0 0.0 - 0.1 K/uL  TSH  Result Value Ref Range   TSH 0.94  0.35 - 4.50 uIU/mL  Ferritin  Result Value Ref Range   Ferritin 6.0 (L) 10.0 - 291.0 ng/mL      Assessment & Plan:   Problem List Items Addressed This Visit    Tobacco abuse    Has failed several attempts to quit including chantix. She will look into hypnotherapy.      Non-intractable vomiting with nausea - Primary    Recurrent symptoms similar to last year (candidal esophagitis on EGD), now with vomiting up blood. Will refer back to GI. Will increase protonix to 20mg  BID. Will start carafate. Ddx includes PUD. Check labs today.       Relevant Orders   Ambulatory referral to Gastroenterology   Comprehensive metabolic panel (Completed)   CBC with Differential/Platelet (Completed)   TSH (Completed)   Iron deficiency anemia    Update ferritin. She never underwent colonoscopy. Will need this addressed.       Relevant Orders   Ferritin (Completed)   COPD (chronic obstructive pulmonary disease) with chronic bronchitis (HCC)    Current smoker.  Using daily albuterol.  Notes worsening exertional dyspnea. Will start spiriva respimat inhaler.  I asked her to return in 1 mo for spirometry.      Relevant Medications   Tiotropium Bromide Monohydrate (SPIRIVA RESPIMAT) 2.5 MCG/ACT AERS   Bipolar 1 disorder (HCC)    Sees Thayer Headings at [facility] psychiatric (Dr [facility] practice). On 3 antipsychotics prescribed by psych, states this works well for her.        Other Visit Diagnoses    Hematemesis with nausea           Meds ordered this encounter  Medications  . ondansetron (ZOFRAN-ODT) 4 MG disintegrating tablet    Sig: Take 1 tablet (4 mg total) by mouth every 8 (eight) hours as needed for nausea or vomiting.    Dispense:  20 tablet    Refill:  2  . pantoprazole (PROTONIX) 20 MG tablet    Sig: Take 1  tablet (20 mg total) by mouth 2 (two) times daily.    Dispense:  60 tablet    Refill:  6  . Tiotropium Bromide Monohydrate (SPIRIVA RESPIMAT) 2.5 MCG/ACT AERS    Sig: Inhale 1 puff into the lungs daily.    Dispense:  4 g    Refill:  6  . sucralfate (CARAFATE) 1 g tablet    Sig: Take 1 tablet (1 g total) by mouth 3 (three) times daily with meals.    Dispense:  60 tablet    Refill:  0   Orders Placed This Encounter  Procedures  . Comprehensive metabolic panel  . CBC with Differential/Platelet  . TSH  . Ferritin  . Ambulatory referral to Gastroenterology    Referral Priority:   Routine    Referral Type:   Consultation    Referral Reason:   Specialty Services Required    Number of Visits Requested:   1    Follow up plan: Return in about 1 month (around 04/25/2018) for follow up visit.  Ria Bush, MD

## 2018-03-28 NOTE — Telephone Encounter (Signed)
[facility] called with critical results. Hemoglobin 7.9 Hematocrit 24.4 Results given to Dr.Gutierrez at 2:30pm. Lendon Collar, RTR

## 2018-03-29 NOTE — Assessment & Plan Note (Signed)
Sees Thayer Headings at [facility] psychiatric (Dr [facility] practice). On 3 antipsychotics prescribed by psych, states this works well for her.

## 2018-03-29 NOTE — Assessment & Plan Note (Addendum)
Recurrent symptoms similar to last year (candidal esophagitis on EGD), now with vomiting up blood. Will refer back to GI. Will increase protonix to 20mg  BID. Will start carafate. Ddx includes PUD. Check labs today.

## 2018-03-29 NOTE — Assessment & Plan Note (Addendum)
Has failed several attempts to quit including chantix. She will look into hypnotherapy.

## 2018-03-29 NOTE — Telephone Encounter (Signed)
See result note.  

## 2018-03-29 NOTE — Assessment & Plan Note (Addendum)
Current smoker.  Using daily albuterol.  Notes worsening exertional dyspnea. Will start spiriva respimat inhaler.  I asked her to return in 1 mo for spirometry.

## 2018-03-29 NOTE — Assessment & Plan Note (Signed)
Update ferritin. She never underwent colonoscopy. Will need this addressed.

## 2018-03-31 ENCOUNTER — Telehealth: Payer: Self-pay | Admitting: Family Medicine

## 2018-03-31 NOTE — Telephone Encounter (Signed)
Copied from [place] [id number]. Topic: Quick Communication - See Telephone Encounter >> Mar 31, 2018  3:13 PM Synthia Innocent wrote: CRM for notification. See Telephone encounter for: 03/31/18. Patient calling back to schedule iron infusions, she would like to go ahead and schedule prior to going out of town. Would like done tonight or tomorrow. Please advise

## 2018-04-01 ENCOUNTER — Ambulatory Visit: Payer: Self-pay

## 2018-04-01 ENCOUNTER — Telehealth: Payer: Self-pay

## 2018-04-01 NOTE — Telephone Encounter (Signed)
Spoke with pt notifying her we received her message about her agreeing to the infusion.  [place].  Informed her we are working on setting up the infusion and will call her Wed, 04/06/18 to schedule. Pt verbalizes understanding. [See TE, 03/31/18]

## 2018-04-01 NOTE — Telephone Encounter (Signed)
Pt. Reports dizziness started 3 days ago. Happens when she stands up or stands for a long period of time. She reports she sits down and the lightheadedness "goes away." No shortness of breath or chest pain. No other symptoms. Answer Assessment - Initial Assessment Questions 1. DESCRIPTION: "Describe your dizziness."     Lightheaded with standing or standing long periods of time 2. LIGHTHEADED: "Do you feel lightheaded?" (e.g., somewhat faint, woozy, weak upon standing)     Weak with standing 3. VERTIGO: "Do you feel like either you or the room is spinning or tilting?" (i.e. vertigo)     No 4. SEVERITY: "How bad is it?"  "Do you feel like you are going to faint?" "Can you stand and walk?"   - MILD - walking normally   - MODERATE - interferes with normal activities (e.g., work, school)    - SEVERE - unable to stand, requires support to walk, feels like passing out now.      Mild 5. ONSET:  "When did the dizziness begin?"     3 days ago 6. AGGRAVATING FACTORS: "Does anything make it worse?" (e.g., standing, change in head position)     Standing makes it worse 7. HEART RATE: "Can you tell me your heart rate?" "How many beats in 15 seconds?"  (Note: not all patients can do this)       No 8. CAUSE: "What do you think is causing the dizziness?"     Maybe I need that infusion 9. RECURRENT SYMPTOM: "Have you had dizziness before?" If so, ask: "When was the last time?" "What happened that time?"     No 10. OTHER SYMPTOMS: "Do you have any other symptoms?" (e.g., fever, chest pain, vomiting, diarrhea, bleeding)       No 11. PREGNANCY: "Is there any chance you are pregnant?" "When was your last menstrual period?"       No  Protocols used: DIZZINESS [facility]

## 2018-04-01 NOTE — Telephone Encounter (Signed)
Faxed order to [facility] at [facility].  Scheduled appt for 04/13/18 at 12:00 PM.  Spoke with pt providing her with date and time of appt.  Also, gave pt [facility] Short Stay phn # [phone] to call if she needs to reschedule, cancel, etc.  Pt verbalizes understanding.

## 2018-04-01 NOTE — Telephone Encounter (Addendum)
Placed order in Dr. Synthia Innocent box.  [See TE, 04/01/18.] [place] will at the beach until [place]ues, 04/05/18.  Informed her we are working on setting up the infusion and will call her Wed, 04/06/18 to schedule. Pt verbalizes understanding.

## 2018-04-01 NOTE — Telephone Encounter (Signed)
Robin Welch spoke w pt

## 2018-04-01 NOTE — Telephone Encounter (Signed)
Filled and in Robin Welch's box/ Will schedule over next few weeks, can't do so quickly today

## 2018-04-01 NOTE — Telephone Encounter (Signed)
Copied from [place] [id number]. Topic: Inquiry >> Apr 01, 2018  8:23 AM Pricilla Handler wrote: Reason for CRM: Patient called returning a call to our office regarding an iron infusion. Please call patient.       Thank You!!!

## 2018-04-06 ENCOUNTER — Telehealth: Payer: Self-pay

## 2018-04-06 NOTE — Telephone Encounter (Signed)
Spoke with pt informing her to go to the main entrance of [facility] and check in at the registry desk.  And they will get her to the Short Stay clinic. Pt verbalizes understanding.

## 2018-04-06 NOTE — Telephone Encounter (Signed)
Copied from [place] [phone]. Topic: Quick Communication - See Telephone Encounter >> Apr 06, 2018  1:32 PM Neva Seat wrote: Pt needing to know the location of where she needs to have her iron infusion done for next Thursday.

## 2018-04-13 ENCOUNTER — Other Ambulatory Visit (HOSPITAL_COMMUNITY): Payer: Self-pay | Admitting: *Deleted

## 2018-04-13 ENCOUNTER — Encounter (HOSPITAL_COMMUNITY): Payer: Self-pay

## 2018-04-14 ENCOUNTER — Ambulatory Visit (HOSPITAL_COMMUNITY)
Admission: RE | Admit: 2018-04-14 | Discharge: 2018-04-14 | Disposition: A | Discharge status: Home / Self Care | Payer: Medicare HMO | Source: Ambulatory Visit | Attending: Family Medicine | Admitting: Family Medicine

## 2018-04-14 DIAGNOSIS — D509 Iron deficiency anemia, unspecified: Secondary | ICD-10-CM | POA: Insufficient documentation

## 2018-04-14 MED ORDER — FERUMOXYTOL INJECTION 510 MG/17 ML
510.0000 mg | Freq: Once | INTRAVENOUS | Status: AC
  Administered 2018-04-14: 510 mg via INTRAVENOUS
  Filled 2018-04-14: qty 17

## 2018-04-15 ENCOUNTER — Ambulatory Visit: Payer: Self-pay | Admitting: *Deleted

## 2018-04-15 ENCOUNTER — Telehealth: Payer: Self-pay | Admitting: Family Medicine

## 2018-04-15 ENCOUNTER — Ambulatory Visit: Payer: Self-pay | Admitting: Family

## 2018-04-15 DIAGNOSIS — Z0289 Encounter for other administrative examinations: Secondary | ICD-10-CM

## 2018-04-15 DIAGNOSIS — B370 Candidal stomatitis: Secondary | ICD-10-CM | POA: Diagnosis not present

## 2018-04-15 DIAGNOSIS — J029 Acute pharyngitis, unspecified: Secondary | ICD-10-CM | POA: Diagnosis not present

## 2018-04-15 NOTE — Telephone Encounter (Signed)
Copied from [place] [id number]. Topic: Quick Communication - See Telephone Encounter >> Apr 15, 2018  4:57 PM Hewitt Shorts wrote: PT WOULD LIKE TO KNOW IF SOMETHING FOR THRUSH COULD BE CALLED IN   [place]  Best number [phone]

## 2018-04-15 NOTE — Telephone Encounter (Signed)
See triage encounter for 04/15/18

## 2018-04-15 NOTE — Telephone Encounter (Signed)
Pt stating for the past 2-3 days she has complaints of her mouth being sore and her gums feeling raw when teeth rubbed against them. Pt states the only thing she has been able to drink was sweet tea and has not been able to eat anything. Pt states she tried to eat mashed potatoes and corn and was unable to eat due to pain in her mouth. Pt states that she feels like her throat is itching and swelling.  Pt feels like she has thrush and states that she did start Spiriva recently. Pt states she has Quigg patches on her tongue and throat.Pt states she had thrush previously but this time her mouth is more sore. Pt advised that she would need to be evaluate for her current symptoms before medication was prescribed. Notified pt that the office was now closed and Dr. Danise Mina was not available to call in a medication for thrush. Pt states "He can't call me anything in, I will go to a immediate care facility, bye".  Pt hung up phone before care advice could be given.  Reason for Disposition . Neilan patches that stick to tongue or inner cheek  Answer Assessment - Initial Assessment Questions 1. ONSET: "When did the mouth start hurting?" (e.g., hours or days ago)      2-3 days ago 2. SEVERITY: "How bad is the pain?" (Scale 1-10; mild, moderate or severe)   - MILD (1-3):  doesn't interfere with eating or normal activities   - MODERATE (4-7): interferes with eating some solids and normal activities   - SEVERE (8-10):  excruciating pain, interferes with most normal activities   - SEVERE DYSPHAGIA: can't swallow liquids, drooling    Pt states that she is not able to eat anything because of the pain in her throat 3. SORES: "Are there any sores or ulcers in the mouth?" If so, ask: "What part of the mouth are the sores in?"     Pt states she has Mikhail patches 4. FEVER: "Do you have a fever?" If so, ask: "What is your temperature, how was it measured, and when did it start?"     No 5. CAUSE: "What do you think is  causing the mouth pain?"     Pt thinks that it is due to thrush 6. OTHER SYMPTOMS: "Do you have any other symptoms?" (e.g., difficulty breathing)    No  Protocols used: MOUTH PAIN-A-AH

## 2018-04-18 NOTE — Telephone Encounter (Signed)
She should continue magic mouthwash (I assume it has nystatin in it). Recommend keep GI appt tomorrow - as if she has deeper fungal infection may need difference course of oral antifungal - touch base tomorrow about her symptoms with GI.

## 2018-04-18 NOTE — Telephone Encounter (Signed)
Pt states she has the liquid magic mouthwash for the thrush, but it is not getting rid of it.  Pt still has. Is there something else, a pill?  Pt states her daughter is a Software engineer and she advised there is something else.  (anti-fungal pill)   [place], [place], [place] [phone] (Phone) [phone] (Fax)   Pt states she is a little better, but still has the sores in her mouth.

## 2018-04-18 NOTE — Telephone Encounter (Signed)
Patient notified as instructed by telephone and verbalized understanding. 

## 2018-04-19 ENCOUNTER — Ambulatory Visit (INDEPENDENT_AMBULATORY_CARE_PROVIDER_SITE_OTHER): Payer: Medicare HMO | Admitting: Gastroenterology

## 2018-04-19 ENCOUNTER — Encounter: Payer: Self-pay | Admitting: Gastroenterology

## 2018-04-19 VITALS — BP 100/66 | HR 109 | Ht 62.0 in | Wt 124.0 lb

## 2018-04-19 DIAGNOSIS — K920 Hematemesis: Secondary | ICD-10-CM | POA: Diagnosis not present

## 2018-04-19 DIAGNOSIS — K5909 Other constipation: Secondary | ICD-10-CM | POA: Diagnosis not present

## 2018-04-19 DIAGNOSIS — K219 Gastro-esophageal reflux disease without esophagitis: Secondary | ICD-10-CM

## 2018-04-19 DIAGNOSIS — M79675 Pain in left toe(s): Secondary | ICD-10-CM | POA: Diagnosis not present

## 2018-04-19 DIAGNOSIS — Z8719 Personal history of other diseases of the digestive system: Secondary | ICD-10-CM | POA: Insufficient documentation

## 2018-04-19 DIAGNOSIS — D509 Iron deficiency anemia, unspecified: Secondary | ICD-10-CM

## 2018-04-19 MED ORDER — NA SULFATE-K SULFATE-MG SULF 17.5-3.13-1.6 GM/177ML PO SOLN: 1.0000 | ORAL | 0 refills | Status: AC

## 2018-04-19 MED ORDER — PANTOPRAZOLE SODIUM 40 MG PO TBEC: 40.0000 mg | DELAYED_RELEASE_TABLET | Freq: Two times a day (BID) | ORAL | 11 refills | Status: DC

## 2018-04-19 NOTE — Progress Notes (Addendum)
04/19/2018 Robin Welch [id number] 04-24-1966   HISTORY OF PRESENT ILLNESS:  This is a 52 year old female who is a patient of Dr. Woodward Welch.  She had EGD in 03/2017 at which time she was found to have the following:  - Esophageal mucosal changes suspicious for long-segment Barrett's esophagus. Biopsied. - LA Grade D esophagitis. Biopsied. - 4 cm hiatal hernia. - Normal examined duodenum.  Biopsies did not show Barrett's, but showed reflux changes with ulceration and candida.  Repeat was recommended in 3 months but she did not do it because of financial reasons.  She is here today complaining of reflux with sore throat and hoarseness.  Also with nausea and vomiting.  Had two episodes where she was vomiting and then resulted in seeing a small amount of blood.  Hgb 7.9 grams recently with low MCV and ferritin of 6.  Received a dose of IV iron last week because she cannot tolerate PO iron.  We had placed her on Protonix 40 mg twice daily, but recently her prescription was refilled by her PCP with only Protonix 20 mg twice daily.  They also gave her Carafate tablets to take, but she has not been taking them because she cannot swallow them well.  She also reports chronic constipation.  Says that sometimes she does not have a BM for a month at a time.  No colonoscopy since she was in her 36's.  Denies seeing blood in her stools.  Past Medical History:  Diagnosis Date  . Anxiety   . Bipolar 1 disorder [facility])    sees Dr. Candis Welch psych [phone])  . COPD (chronic obstructive pulmonary disease) ([place]) 03/2013   hyperinflation by CXR  . History of cocaine dependence latest 2017   undergoing detox  . History of stomach ulcers   . Hx: UTI (urinary tract infection)   . Migraine    "stopped in ~ 2008" (03/11/2017)  . Positive PPD 1989   s/p CXR and INH 6 mo  . Seizure (Robin Welch) X 1   "from takiing too many headache medicine?" (03/11/2017)  . Smoker    Past Surgical History:  Procedure Laterality  Date  . DILATION AND CURETTAGE OF UTERUS  X 1   S/P miscarriage  . ESOPHAGOGASTRODUODENOSCOPY  03/2017   candidal esophagitis, treated, LA grade D esophagitis, 4cm HH (Nandigam)  . LAPAROSCOPIC CHOLECYSTECTOMY  2006  . TUBAL LIGATION  2003    reports that she has been smoking cigarettes. She has a 72.00 pack-year smoking history. She has never used smokeless tobacco. She reports that she drank alcohol. She reports that she has current or past drug history. family history includes ADD / ADHD in her child; Anxiety disorder in her child, cousin, and paternal aunt; Breast cancer (age of onset: 35) in her mother; Coronary artery disease (age of onset: 68) in her father; Dementia in her mother; Depression in her child and cousin; Diabetes in her mother; Lymphoma in her mother. Allergies  Allergen Reactions  . Prozac [Fluoxetine Hcl] Swelling and Rash  . Doxycycline Nausea And Vomiting  . Chantix [Varenicline Tartrate] Swelling    Mouth swelling  . Azithromycin     Reaction not recalled by the patient      Outpatient Encounter Medications as of 04/19/2018  Medication Sig  . busPIRone (BUSPAR) 30 MG tablet   . gabapentin (NEURONTIN) 800 MG tablet Take 800 mg by mouth 4 (four) times daily.   . hydrOXYzine (ATARAX/VISTARIL) 25 MG tablet TAKE TWO TABLETS  BY MOUTH EVERY MORNING, TWO tablets midday AND ONE tablet AT BEDTIME  . OLANZapine (ZYPREXA) 15 MG tablet TK 1 TS PO QHS  . ondansetron (ZOFRAN-ODT) 4 MG disintegrating tablet Take 1 tablet (4 mg total) by mouth every 8 (eight) hours as needed for nausea or vomiting.  . pantoprazole (PROTONIX) 20 MG tablet Take 1 tablet (20 mg total) by mouth 2 (two) times daily.  . QUEtiapine (SEROQUEL) 400 MG tablet Take 800 mg by mouth at bedtime.   . SF 5000 PLUS 1.1 % CREA dental cream BRUSH TEETH WITH SMALL AMOUNT BID  . sucralfate (CARAFATE) 1 g tablet Take 1 tablet (1 g total) by mouth 3 (three) times daily with meals.  . Tiotropium Bromide Monohydrate  (SPIRIVA RESPIMAT) 2.5 MCG/ACT AERS Inhale 1 puff into the lungs daily.  . VENTOLIN HFA 108 (90 Base) MCG/ACT inhaler INHALE TWO puffs into THE lungs EVERY SIX HOURS AS NEEDED wheezind OR SHORTNESS OF BREATH  . ziprasidone (GEODON) 80 MG capsule Take 80 mg by mouth daily.    No facility-administered encounter medications on file as of 04/19/2018.      REVIEW OF SYSTEMS  : All other systems reviewed and negative except where noted in the History of Present Illness.   PHYSICAL EXAM: BP 100/66   Pulse (!) 109   Ht 5\' 2"  (1.575 m)   Wt 124 lb (56.2 kg)   LMP 01/04/2015   BMI 22.68 kg/m  General: Well developed Begue female in no acute distress Head: Normocephalic and atraumatic Eyes:  Sclerae anicteric, conjunctiva pink. Ears: Normal auditory acuity Lungs: Clear throughout to auscultation; no increased WOB. Heart: Tachy; no M/R/G. Abdomen: Soft, non-distended.  BS present.  Non-tender. Rectal:  Will be done at the time of colonoscopy. Musculoskeletal: Symmetrical with no gross deformities  Skin: No lesions on visible extremities Extremities: No edema  Neurological: Alert oriented x 4, grossly non-focal Psychological:  Alert and cooperative. Normal mood and affect  ASSESSMENT AND PLAN: *Chronic constipation:  Says that sometimes she does not have a BM for a month at a time.  Will have her start Miralax daily.  Can titrate up if needed.  No colonoscopy since she was in her 93's. *IDA:  Ferritin 6 and Hgb 7.9 grams.  Just received IV iron infusion last week. *GERD, hematemesis:  Had severe esophagitis last year and was supposed to have repeat EGD in 3 months.  She has had 2 episodes of small volume hematemesis recently that occur after vomiting multiple times.  I think this is likely due to her esophagitis or possibly MWT.  Is only on pantoprazole 20 mg BID.  I am going to increase back to 40 mg BID.  Has carafate pills but cannot swallow them so is not taking them.  Advised to crush and  mix with liquid and drink if like a slurry ACHS.   **Needs repeat EGD and colonoscopy.  Will schedule with Dr. Silverio Decamp.  The risks, benefits, and alternatives to EGD and colonoscopy were discussed with the patient and she consents to proceed.    CC:  Ria Bush, MD

## 2018-04-19 NOTE — Patient Instructions (Addendum)
We have sent the following medications to your pharmacy for you to pick up at your convenience: Pantoprazole 40 mg   Use Miralax 17 grams once daily in 8 oz of juice or liquid.  Crush & Mix carafate with liquids or water and drink.    You have been scheduled for an endoscopy and colonoscopy. Please follow the written instructions given to you at your visit today. Please pick up your prep supplies at the pharmacy within the next 1-3 days. If you use inhalers (even only as needed), please bring them with you on the day of your procedure. Your physician has requested that you go to www.startemmi.com and enter the access code given to you at your visit today. This web site gives a general overview about your procedure. However, you should still follow specific instructions given to you by our office regarding your preparation for the procedure.   Marland Kitchen

## 2018-04-21 NOTE — Progress Notes (Signed)
Reviewed and agree with documentation and assessment and plan. K. Veena Alexica Schlossberg , MD   

## 2018-05-04 ENCOUNTER — Ambulatory Visit: Payer: Self-pay | Admitting: *Deleted

## 2018-05-04 NOTE — Telephone Encounter (Signed)
Pt calling stating that she has been experiencing pain on the right side of chest beginning this afternoon. Pt states she really feels the pain and describes the feeling as a  Sharp pain.Pt states she also has the pain with breathing and trying to take a deep breath but it is worse with coughing. Pt denies having SOB at this time. Pt states she does have a history of COPD and has a productive cough. Pt denies any other symptoms at this time. Pt advised to seek care in the ED for current symptoms but pt is refusing to go at this time. Pt states that she is currently driving and has to watch her son tonight. Pt states she is exhausted from working today and "does not feel like sitting in the ED for 5 hours". Pt refusing to go to ED tonight and states she would like to come in for an appt with Dr. Danise Mina on 05/05/18 Reason for Disposition . Taking a deep breath makes pain worse  Answer Assessment - Initial Assessment Questions 1. LOCATION: "Where does it hurt?"       Right side of chest 2. RADIATION: "Does the pain go anywhere else?" (e.g., into neck, jaw, arms, back)     no 3. ONSET: "When did the chest pain begin?" (Minutes, hours or days)      This afternoon 4. PATTERN "Does the pain come and go, or has it been constant since it started?"  "Does it get worse with exertion?"      Gets worse with coughing and taking a deep breath 5. DURATION: "How long does it last" (e.g., seconds, minutes, hours)     Not assessed 6. SEVERITY: "How bad is the pain?"  (e.g., Scale 1-10; mild, moderate, or severe)    - MILD (1-3): doesn't interfere with normal activities     - MODERATE (4-7): interferes with normal activities or awakens from sleep    - SEVERE (8-10): excruciating pain, unable to do any normal activities       Light pain with breathing, sharp pain with coughing, rates at 6, feels pain with breathing in deep 7. CARDIAC RISK FACTORS: "Do you have any history of heart problems or risk factors for heart  disease?" (e.g., prior heart attack, angina; high blood pressure, diabetes, being overweight, high cholesterol, smoking, or strong family history of heart disease)     Smoker, yes and family history 8. PULMONARY RISK FACTORS: "Do you have any history of lung disease?"  (e.g., blood clots in lung, asthma, emphysema, birth control pills)     COPD 9. CAUSE: "What do you think is causing the chest pain?"     unsure 10. OTHER SYMPTOMS: "Do you have any other symptoms?" (e.g., dizziness, nausea, vomiting, sweating, fever, difficulty breathing, cough)       cough 11. PREGNANCY: "Is there any chance you are pregnant?" "When was your last menstrual period?" n/a  Protocols used: CHEST PAIN-A-AH

## 2018-05-05 ENCOUNTER — Other Ambulatory Visit: Payer: Self-pay

## 2018-05-05 ENCOUNTER — Emergency Department (HOSPITAL_COMMUNITY): Payer: Medicare HMO

## 2018-05-05 ENCOUNTER — Encounter (HOSPITAL_COMMUNITY): Payer: Self-pay | Admitting: Emergency Medicine

## 2018-05-05 ENCOUNTER — Emergency Department (HOSPITAL_COMMUNITY)
Admission: EM | Admit: 2018-05-05 | Discharge: 2018-05-05 | Disposition: A | Discharge status: Home / Self Care | Payer: Medicare HMO | Attending: Emergency Medicine | Admitting: Emergency Medicine

## 2018-05-05 DIAGNOSIS — R079 Chest pain, unspecified: Secondary | ICD-10-CM | POA: Diagnosis not present

## 2018-05-05 DIAGNOSIS — J449 Chronic obstructive pulmonary disease, unspecified: Secondary | ICD-10-CM | POA: Diagnosis not present

## 2018-05-05 DIAGNOSIS — R0781 Pleurodynia: Secondary | ICD-10-CM | POA: Insufficient documentation

## 2018-05-05 DIAGNOSIS — R05 Cough: Secondary | ICD-10-CM | POA: Diagnosis not present

## 2018-05-05 DIAGNOSIS — F17210 Nicotine dependence, cigarettes, uncomplicated: Secondary | ICD-10-CM | POA: Insufficient documentation

## 2018-05-05 DIAGNOSIS — R0789 Other chest pain: Secondary | ICD-10-CM | POA: Diagnosis not present

## 2018-05-05 DIAGNOSIS — Z79899 Other long term (current) drug therapy: Secondary | ICD-10-CM | POA: Diagnosis not present

## 2018-05-05 LAB — CBC
HCT: 30.9 % — ABNORMAL LOW (ref 36.0–46.0)
Hemoglobin: 9.8 g/dL — ABNORMAL LOW (ref 12.0–15.0)
MCH: 24.6 pg — ABNORMAL LOW (ref 26.0–34.0)
MCHC: 31.7 g/dL (ref 30.0–36.0)
MCV: 77.4 fL — ABNORMAL LOW (ref 78.0–100.0)
Platelets: 622 10*3/uL — ABNORMAL HIGH (ref 150–400)
RBC: 3.99 MIL/uL (ref 3.87–5.11)
RDW: 24.3 % — ABNORMAL HIGH (ref 11.5–15.5)
WBC: 5.9 10*3/uL (ref 4.0–10.5)

## 2018-05-05 LAB — BASIC METABOLIC PANEL
Anion gap: 13 (ref 5–15)
BUN: <5 mg/dL — ABNORMAL LOW (ref 6–20)
CO2: 24 mmol/L (ref 22–32)
Calcium: 8.3 mg/dL — ABNORMAL LOW (ref 8.9–10.3)
Chloride: 95 mmol/L — ABNORMAL LOW (ref 98–111)
Creatinine, Ser: 0.69 mg/dL (ref 0.44–1.00)
GFR calc Af Amer: >60 mL/min (ref >60)
GFR calc non Af Amer: >60 mL/min (ref >60)
Glucose, Bld: 139 mg/dL — ABNORMAL HIGH (ref 70–99)
Potassium: 2.9 mmol/L — ABNORMAL LOW (ref 3.5–5.1)
Sodium: 132 mmol/L — ABNORMAL LOW (ref 135–145)

## 2018-05-05 LAB — I-STAT TROPONIN, ED: Troponin i, poc: 0 ng/mL (ref 0.00–0.08)

## 2018-05-05 LAB — D-DIMER, QUANTITATIVE (NOT AT ARMC): D-Dimer, Quant: 0.38 ug/mL-FEU (ref 0.00–0.50)

## 2018-05-05 MED ORDER — POTASSIUM CHLORIDE CRYS ER 20 MEQ PO TBCR
60.0000 meq | EXTENDED_RELEASE_TABLET | Freq: Once | ORAL | Status: AC
  Administered 2018-05-05: 60 meq via ORAL
  Filled 2018-05-05: qty 3

## 2018-05-05 MED ORDER — IBUPROFEN 200 MG PO TABS
600.0000 mg | ORAL_TABLET | Freq: Once | ORAL | Status: AC
  Administered 2018-05-05: 600 mg via ORAL
  Filled 2018-05-05: qty 3

## 2018-05-05 NOTE — Telephone Encounter (Signed)
Per chart review note pt is at [facility] ED now.

## 2018-05-05 NOTE — ED Triage Notes (Signed)
Patient c/o rt chest pain staring 2 days ago. Exacerbated by  cough x2 weeks. Pt denies SOB but reports cp when breathing. Pt has hx of COPD and has been compliant with medications. Denies back or arm pain.

## 2018-05-05 NOTE — Discharge Instructions (Signed)
I believe your chest pain is from a condition called pleurisy.  There are potentially serious conditions that can cause pain like this, but I doubt it in your case.  Your lungs sound clear on exam.  Your chest x-ray does not show any concerning findings.  There is a very low probability of this being from a blood clot based on your blood work and other factors.  Pleuritic pain like you are having is typically treated with NSAIDs such as ibuprofen, Aleve, Naprosyn, etc.  I would take 600 mg of ibuprofen every 6 hours as needed for pain.  I would like you reevaluated if you develop significantly worsening pain, shortness of breath, fever or feel like you may pass out.

## 2018-05-05 NOTE — ED Provider Notes (Signed)
[facility] DEPT Provider Note   CSN: [id number] Arrival date & time: 05/05/18  0730     History   Chief Complaint Chief Complaint  Patient presents with  . Chest Pain    HPI Robin Welch is a 52 y.o. female.  HPI   51 year old female with chest pain.  Right-sided.  Describes sharp pain intermittently in the right anterior chest.  Onset 2 days ago.  Persistent since then.  Exacerbated by coughing and deep breathing.  She is a nonproductive cough.  No fevers or chills.  She does not feel short of breath.  No unusual leg pain or swelling.  Denies any past history of DVT/PE.  No recent immobilization or surgery.  No significant change in symptoms with exertion.  She has been taking Tylenol with improvement.  Past Medical History:  Diagnosis Date  . Anxiety   . Bipolar 1 disorder [facility])    sees Dr. Candis Schatz psych [phone])  . COPD (chronic obstructive pulmonary disease) ([place]) 03/2013   hyperinflation by CXR  . History of cocaine dependence latest 2017   undergoing detox  . History of stomach ulcers   . Hx: UTI (urinary tract infection)   . Migraine    "stopped in ~ 2008" (03/11/2017)  . Positive PPD 1989   s/p CXR and INH 6 mo  . Seizure ([place]) X 1   "from takiing too many headache medicine?" (03/11/2017)  . Smoker     Patient Active Problem List   Diagnosis Date Noted  . Chronic constipation 04/19/2018  . Gastroesophageal reflux disease 04/19/2018  . Hematemesis 04/19/2018  . H/O esophagitis 04/19/2018  . Thrush 06/21/2017  . Hyponatremia 03/11/2017  . Iron deficiency anemia 03/11/2017  . Thrombocytosis ([place]) 03/02/2017  . Non-intractable vomiting with nausea 03/01/2017  . History of cocaine dependence   . Migraine 05/08/2015  . Hoarseness of voice 04/04/2015  . COPD (chronic obstructive pulmonary disease) with chronic bronchitis ([place]) 09/22/2013  . Tobacco abuse 12/18/2010  . Bipolar 1 disorder ([place]) 12/18/2010    Past Surgical  History:  Procedure Laterality Date  . DILATION AND CURETTAGE OF UTERUS  X 1   S/P miscarriage  . ESOPHAGOGASTRODUODENOSCOPY  03/2017   candidal esophagitis, treated, LA grade D esophagitis, 4cm HH ([place])  . LAPAROSCOPIC CHOLECYSTECTOMY  2006  . TUBAL LIGATION  2003     OB History   None      Home Medications    Prior to Admission medications   Medication Sig Start Date End Date Taking? Authorizing Provider  acetaminophen (TYLENOL) 500 MG tablet Take 1,000 mg by mouth every 6 (six) hours as needed for mild pain or moderate pain.   Yes [provider]  busPIRone (BUSPAR) 30 MG tablet Take 30 mg by mouth 3 (three) times daily.  10/08/17  Yes [provider]  gabapentin (NEURONTIN) 800 MG tablet Take 800 mg by mouth 4 (four) times daily.  03/01/17  Yes Ria Bush, MD  hydrOXYzine (ATARAX/VISTARIL) 25 MG tablet TAKE TWO TABLETS BY MOUTH EVERY MORNING, TWO tablets midday AND ONE tablet AT BEDTIME Patient taking differently: Take 50 mg by mouth every 8 (eight) hours as needed for anxiety.  11/12/17  Yes Plovsky, Berneta Sages, MD  OLANZapine (ZYPREXA) 15 MG tablet Take 15 mg by mouth at bedtime.  09/27/17  Yes [provider]  ondansetron (ZOFRAN-ODT) 4 MG disintegrating tablet Take 1 tablet (4 mg total) by mouth every 8 (eight) hours as needed for nausea or vomiting. 03/28/18  Yes Ria Bush, MD  pantoprazole (PROTONIX) 40 MG tablet Take 1 tablet (40 mg total) by mouth 2 (two) times daily before a meal. 04/19/18  Yes Zehr, Janett Billow D, PA-C  QUEtiapine (SEROQUEL) 400 MG tablet Take 800 mg by mouth at bedtime.  03/23/17  Yes Ria Bush, MD  SF 5000 PLUS 1.1 % CREA dental cream Place 1 application onto teeth at bedtime.  09/21/17  Yes [provider]  sucralfate (CARAFATE) 1 g tablet Take 1 tablet (1 g total) by mouth 3 (three) times daily with meals. 03/28/18  Yes Ria Bush, MD  Tiotropium Bromide Monohydrate (SPIRIVA RESPIMAT) 2.5 MCG/ACT  AERS Inhale 1 puff into the lungs daily. 03/28/18  Yes Ria Bush, MD  VENTOLIN HFA 108 [phone] Base) MCG/ACT inhaler INHALE TWO puffs into THE lungs EVERY SIX HOURS AS NEEDED wheezind OR SHORTNESS OF BREATH Patient taking differently: Inhale 1-2 puffs into the lungs every 4 (four) hours as needed for wheezing.  01/17/18  Yes Ria Bush, MD  ziprasidone (GEODON) 80 MG capsule Take 80 mg by mouth daily.    Yes [provider]  Na Sulfate-K Sulfate-Mg Sulf 17.5-3.13-1.6 GM/177ML SOLN Take 1 kit by mouth as directed. 04/19/18 05/19/18  Zehr, Laban Emperor, PA-C    Family History Family History  Problem Relation Age of Onset  . Diabetes Mother        T2DM  . Breast cancer Mother 73  . Dementia Mother   . Lymphoma Mother   . Coronary artery disease Father 24       MI  . Anxiety disorder Paternal Aunt   . Anxiety disorder Cousin   . Depression Cousin   . ADD / ADHD Child   . Anxiety disorder Child   . Depression Child   . Stroke Neg Hx   . Colon cancer Neg Hx   . Esophageal cancer Neg Hx   . Cancer - Colon Neg Hx     Social History Social History   Tobacco Use  . Smoking status: Current Every Day Smoker    Packs/day: 2.00    Years: 36.00    Pack years: 72.00    Types: Cigarettes  . Smokeless tobacco: Never Used  Substance Use Topics  . Alcohol use: Not Currently    Alcohol/week: 0.0 standard drinks    Comment: 03/11/2017 "stopped 09/18/2015; never an alcoholic"  . Drug use: Not Currently    Comment: 03/11/2017 "stopped 09/18/2015"     Allergies   Prozac [fluoxetine hcl]; Doxycycline; Chantix [varenicline tartrate]; and Azithromycin   Review of Systems Review of Systems  All systems reviewed and negative, other than as noted in HPI.  Physical Exam Updated Vital Signs BP 114/72 (BP Location: Right Arm)   Pulse (!) 102   Temp (!) 97.5 F (36.4 C) (Oral)   Resp 13   Ht '5\' 2"'  (1.575 m)   Wt 58.1 kg   LMP 01/04/2015   SpO2 99%   BMI 23.41 kg/m    Physical Exam  Constitutional: She appears well-developed and well-nourished. No distress.  HENT:  Head: Normocephalic and atraumatic.  Eyes: Conjunctivae are normal. Right eye exhibits no discharge. Left eye exhibits no discharge.  Neck: Neck supple.  Cardiovascular: Regular rhythm and normal heart sounds. Exam reveals no gallop and no friction rub.  No murmur heard. Mild tachycardia  Pulmonary/Chest: Effort normal and breath sounds normal. No respiratory distress.  Abdominal: Soft. She exhibits no distension. There is no tenderness.  Musculoskeletal: She exhibits no edema or  tenderness.  Lower extremities symmetric as compared to each other. No calf tenderness. Negative Homan's. No palpable cords.   Neurological: She is alert.  Skin: Skin is warm and dry.  Psychiatric: She has a normal mood and affect. Her behavior is normal. Thought content normal.  Nursing note and vitals reviewed.    ED Treatments / Results  Labs (all labs ordered are listed, but only abnormal results are displayed) Labs Reviewed  BASIC METABOLIC PANEL - Abnormal; Notable for the following components:      Result Value   Sodium 132 (*)    Potassium 2.9 (*)    Chloride 95 (*)    Glucose, Bld 139 (*)    BUN <5 (*)    Calcium 8.3 (*)    All other components within normal limits  CBC - Abnormal; Notable for the following components:   Hemoglobin 9.8 (*)    HCT 30.9 (*)    MCV 77.4 (*)    MCH 24.6 (*)    RDW 24.3 (*)    Platelets 622 (*)    All other components within normal limits  D-DIMER, QUANTITATIVE (NOT AT [facility])  I-STAT TROPONIN, ED    EKG EKG Interpretation  Date/Time:  Thursday May 05 2018 07:45:19 EDT Ventricular Rate:  103 PR Interval:    QRS Duration: 76 QT Interval:  356 QTC Calculation: 466 R Axis:   67 Text Interpretation:  Sinus tachycardia Abnormal R-wave progression, early transition Confirmed by Virgel Manifold [phone]) on 05/05/2018 8:41:06 AM   Radiology Dg Chest 2  View  Result Date: 05/05/2018 CLINICAL DATA:  Cough for 2 weeks. Chest pain for 2 days. History of COPD. EXAM: CHEST - 2 VIEW COMPARISON:  04/20/2017 FINDINGS: The cardiomediastinal silhouette is within normal limits. Lung volumes are lower than on the prior study. No airspace consolidation, edema, pleural effusion, or pneumothorax is identified. No acute osseous abnormality is seen. IMPRESSION: No active cardiopulmonary disease. Electronically Signed   By: Logan Bores M.D.   On: 05/05/2018 08:51    Procedures Procedures (including critical care time)  Medications Ordered in ED Medications  potassium chloride SA (K-DUR,KLOR-CON) CR tablet 60 mEq (60 mEq Oral Given 05/05/18 0929)     Initial Impression / Assessment and Plan / ED Course  I have reviewed the triage vital signs and the nursing notes.  Pertinent labs & imaging results that were available during my care of the patient were reviewed by me and considered in my medical decision making (see chart for details).    52 year old female with chest pain.  Describes pleuritic right-sided chest pain.  Atypical for ACS.  EKG with no overt ischemic changes.  Troponin is normal with symptom duration of 2 days.  She denies any dyspnea.  Oxygen saturations are normal.  She is mildly tachycardic, but her d-dimer is normal.  Chest x-ray without any acute focal findings.  I doubt emergent etiology of her symptoms.  Recommended PRN NSAIDs.  Return precautions discussed.  I have reviewed the triage vital signs and the nursing notes. Prior records were reviewed for additional information.    Pertinent labs & imaging results that were available during my care of the patient were reviewed by me and considered in my medical decision making (see chart for details).    Final Clinical Impressions(s) / ED Diagnoses   Final diagnoses:  Pleuritic chest pain    ED Discharge Orders    None       Virgel Manifold, MD 05/05/18 1049

## 2018-05-05 NOTE — Telephone Encounter (Signed)
Noted seen at ER. plz schedule ER f/u visit, 30 min if able.

## 2018-05-05 NOTE — ED Notes (Signed)
Patient ambulated to restroom without difficulty.

## 2018-05-06 ENCOUNTER — Telehealth: Payer: Self-pay | Admitting: Family Medicine

## 2018-05-06 NOTE — Telephone Encounter (Signed)
Lvm asking pt to call office 

## 2018-05-06 NOTE — Telephone Encounter (Unsigned)
Copied from [place] [id number]. Topic: Quick Communication - Office Called Patient >> May 06, 2018  3:22 PM Elie Confer wrote: Reason for CRM: Called to schedule hospital f/u with Dr. Bonner Puna for PEC to schedule when she calls back.

## 2018-05-12 ENCOUNTER — Other Ambulatory Visit: Payer: Self-pay | Admitting: Family Medicine

## 2018-05-12 NOTE — Telephone Encounter (Signed)
Zofran ODT Last filled: 05/12/18, #20 Last OV:  03/28/18 Next OV:  none

## 2018-05-14 ENCOUNTER — Other Ambulatory Visit: Payer: Self-pay

## 2018-05-14 ENCOUNTER — Emergency Department (HOSPITAL_COMMUNITY)
Admission: EM | Admit: 2018-05-14 | Discharge: 2018-05-14 | Disposition: A | Discharge status: Home / Self Care | Payer: Medicare HMO | Attending: Emergency Medicine | Admitting: Emergency Medicine

## 2018-05-14 ENCOUNTER — Encounter (HOSPITAL_COMMUNITY): Payer: Self-pay

## 2018-05-14 ENCOUNTER — Emergency Department (HOSPITAL_COMMUNITY): Payer: Medicare HMO

## 2018-05-14 DIAGNOSIS — Y929 Unspecified place or not applicable: Secondary | ICD-10-CM | POA: Insufficient documentation

## 2018-05-14 DIAGNOSIS — S91321A Laceration with foreign body, right foot, initial encounter: Secondary | ICD-10-CM | POA: Diagnosis not present

## 2018-05-14 DIAGNOSIS — J449 Chronic obstructive pulmonary disease, unspecified: Secondary | ICD-10-CM | POA: Insufficient documentation

## 2018-05-14 DIAGNOSIS — F17210 Nicotine dependence, cigarettes, uncomplicated: Secondary | ICD-10-CM | POA: Insufficient documentation

## 2018-05-14 DIAGNOSIS — S92332A Displaced fracture of third metatarsal bone, left foot, initial encounter for closed fracture: Secondary | ICD-10-CM | POA: Diagnosis not present

## 2018-05-14 DIAGNOSIS — Y939 Activity, unspecified: Secondary | ICD-10-CM | POA: Diagnosis not present

## 2018-05-14 DIAGNOSIS — Y999 Unspecified external cause status: Secondary | ICD-10-CM | POA: Insufficient documentation

## 2018-05-14 DIAGNOSIS — Z79899 Other long term (current) drug therapy: Secondary | ICD-10-CM | POA: Insufficient documentation

## 2018-05-14 DIAGNOSIS — X58XXXA Exposure to other specified factors, initial encounter: Secondary | ICD-10-CM | POA: Diagnosis not present

## 2018-05-14 DIAGNOSIS — M79671 Pain in right foot: Secondary | ICD-10-CM | POA: Diagnosis not present

## 2018-05-14 DIAGNOSIS — M79672 Pain in left foot: Secondary | ICD-10-CM | POA: Diagnosis not present

## 2018-05-14 DIAGNOSIS — S92333A Displaced fracture of third metatarsal bone, unspecified foot, initial encounter for closed fracture: Secondary | ICD-10-CM

## 2018-05-14 DIAGNOSIS — S99922A Unspecified injury of left foot, initial encounter: Secondary | ICD-10-CM | POA: Diagnosis present

## 2018-05-14 NOTE — Discharge Instructions (Addendum)
Follow up with Dr. Percell Miller for the fracture of the left foot and with [facility] if the right heel starts to feel like there is a foreign body again.

## 2018-05-14 NOTE — ED Triage Notes (Signed)
She tells me she has persistent left foot pain (x 1 month); and recently stepped on glass, which she has in her left heel area and for which she was seen at urgent care this morning "but the doctor there couldn't find it".

## 2018-05-14 NOTE — ED Provider Notes (Signed)
[facility] DEPT Provider Note   CSN: [id number] Arrival date & time: 05/14/18  1239     History   Chief Complaint Chief Complaint  Patient presents with  . Foreign Body  . Foot Pain    HPI Robin Welch is a 52 y.o. female who presents to the ED with left foot pain that has been persistent for the past month. Patient reports going to an Urgent Care when the pain started and had x-ray and they told her everything was normal. She asked for a walking boot due to the severe pain and has been wearing it. She states that when she stops wearing it the pain returns. Patient also c/o FB to the right heel. She went to Urgent Care today and reports that the provider numbed her up and probed for possible glass and thought he felt something but could never get it out. Patient reports she no longer feels like anything in the foot.   HPI  Past Medical History:  Diagnosis Date  . Anxiety   . Bipolar 1 disorder [facility])    sees Dr. Candis Schatz psych [phone])  . COPD (chronic obstructive pulmonary disease) ([place]) 03/2013   hyperinflation by CXR  . History of cocaine dependence latest 2017   undergoing detox  . History of stomach ulcers   . Hx: UTI (urinary tract infection)   . Migraine    "stopped in ~ 2008" (03/11/2017)  . Positive PPD 1989   s/p CXR and INH 6 mo  . Seizure ([place]) X 1   "from takiing too many headache medicine?" (03/11/2017)  . Smoker     Patient Active Problem List   Diagnosis Date Noted  . Chronic constipation 04/19/2018  . Gastroesophageal reflux disease 04/19/2018  . Hematemesis 04/19/2018  . H/O esophagitis 04/19/2018  . Thrush 06/21/2017  . Hyponatremia 03/11/2017  . Iron deficiency anemia 03/11/2017  . Thrombocytosis ([place]) 03/02/2017  . Non-intractable vomiting with nausea 03/01/2017  . History of cocaine dependence   . Migraine 05/08/2015  . Hoarseness of voice 04/04/2015  . COPD (chronic obstructive pulmonary disease) with chronic  bronchitis ([place]) 09/22/2013  . Tobacco abuse 12/18/2010  . Bipolar 1 disorder ([place]) 12/18/2010    Past Surgical History:  Procedure Laterality Date  . DILATION AND CURETTAGE OF UTERUS  X 1   S/P miscarriage  . ESOPHAGOGASTRODUODENOSCOPY  03/2017   candidal esophagitis, treated, LA grade D esophagitis, 4cm HH (Nandigam)  . LAPAROSCOPIC CHOLECYSTECTOMY  2006  . TUBAL LIGATION  2003     OB History   None      Home Medications    Prior to Admission medications   Medication Sig Start Date End Date Taking? Authorizing Provider  acetaminophen (TYLENOL) 500 MG tablet Take 1,000 mg by mouth every 6 (six) hours as needed for mild pain or moderate pain.    [provider]  busPIRone (BUSPAR) 30 MG tablet Take 30 mg by mouth 3 (three) times daily.  10/08/17   [provider]  gabapentin (NEURONTIN) 800 MG tablet Take 800 mg by mouth 3 (three) times daily.  03/01/17   Ria Bush, MD  hydrOXYzine (ATARAX/VISTARIL) 25 MG tablet TAKE TWO TABLETS BY MOUTH EVERY MORNING, TWO tablets midday AND ONE tablet AT BEDTIME Patient taking differently: Take 50 mg by mouth 3 (three) times daily.  11/12/17   Plovsky, Berneta Sages, MD  Na Sulfate-K Sulfate-Mg Sulf 17.5-3.13-1.6 GM/177ML SOLN Take 1 kit by mouth as directed. 04/19/18 05/19/18  Zehr, Laban Emperor, PA-C  OLANZapine (ZYPREXA) 15 MG tablet Take 15 mg by mouth at bedtime.  09/27/17   [provider]  ondansetron (ZOFRAN-ODT) 4 MG disintegrating tablet Take 1 tablet (4 mg total) by mouth every 8 (eight) hours as needed for nausea or vomiting. 05/13/18   Ria Bush, MD  pantoprazole (PROTONIX) 40 MG tablet Take 1 tablet (40 mg total) by mouth 2 (two) times daily before a meal. 04/19/18   Zehr, Janett Billow D, PA-C  QUEtiapine (SEROQUEL) 400 MG tablet Take 800 mg by mouth at bedtime.  03/23/17   Ria Bush, MD  SF 5000 PLUS 1.1 % CREA dental cream Place 1 application onto teeth at bedtime.  09/21/17   [provider]    sucralfate (CARAFATE) 1 g tablet Take 1 tablet (1 g total) by mouth 3 (three) times daily with meals. 03/28/18   Ria Bush, MD  Tiotropium Bromide Monohydrate (SPIRIVA RESPIMAT) 2.5 MCG/ACT AERS Inhale 1 puff into the lungs daily. 03/28/18   Ria Bush, MD  VENTOLIN HFA 108 [phone] Base) MCG/ACT inhaler INHALE TWO puffs into THE lungs EVERY SIX HOURS AS NEEDED wheezind OR SHORTNESS OF BREATH Patient taking differently: Inhale 1-2 puffs into the lungs every 4 (four) hours as needed for wheezing.  01/17/18   Ria Bush, MD  ziprasidone (GEODON) 80 MG capsule Take 80 mg by mouth daily.     [provider]    Family History Family History  Problem Relation Age of Onset  . Diabetes Mother        T2DM  . Breast cancer Mother 1  . Dementia Mother   . Lymphoma Mother   . Coronary artery disease Father 21       MI  . Anxiety disorder Paternal Aunt   . Anxiety disorder Cousin   . Depression Cousin   . ADD / ADHD Child   . Anxiety disorder Child   . Depression Child   . Stroke Neg Hx   . Colon cancer Neg Hx   . Esophageal cancer Neg Hx   . Cancer - Colon Neg Hx     Social History Social History   Tobacco Use  . Smoking status: Current Every Day Smoker    Packs/day: 2.00    Years: 36.00    Pack years: 72.00    Types: Cigarettes  . Smokeless tobacco: Never Used  Substance Use Topics  . Alcohol use: Not Currently    Alcohol/week: 0.0 standard drinks    Comment: 03/11/2017 "stopped 09/18/2015; never an alcoholic"  . Drug use: Not Currently    Comment: 03/11/2017 "stopped 09/18/2015"     Allergies   Prozac [fluoxetine hcl]; Doxycycline; Celexa [citalopram hydrobromide]; Chantix [varenicline tartrate]; Hydroxyzine; Klonopin [clonazepam]; Lunesta [eszopiclone]; Sonata [zaleplon]; Trazodone and nefazodone; Zolpimist [zolpidem tartrate]; and Azithromycin   Review of Systems Review of Systems  Musculoskeletal: Positive for arthralgias.  Skin: Positive for  wound.  All other systems reviewed and are negative.    Physical Exam Updated Vital Signs BP 132/82 (BP Location: Right Arm)   Pulse (!) 102 Comment: Pt reports she is anxious because she needs a cigarette  Temp 97.8 F (36.6 C) (Oral)   Resp 18   LMP 04/23/2015 (Approximate)   SpO2 97%   Physical Exam  Constitutional: She appears well-developed and well-nourished. No distress.  HENT:  Head: Normocephalic.  Eyes: EOM are normal.  Neck: Neck supple.  Cardiovascular: Normal rate.  Pulmonary/Chest: Effort normal.  Musculoskeletal:       Left foot: There is tenderness  and swelling. There is normal range of motion, normal capillary refill and no deformity.  Swelling and tenderness to the dorsum of the left foot. Right foot with bandage to the heel and patient reports does not hurt and does not want it evaluated further. Right foot with bandage and patient does not want further evaluation of that foot other than the x-ray.   Neurological: She is alert.  Skin: Skin is warm and dry.  Psychiatric: She has a normal mood and affect.  Nursing note and vitals reviewed.    ED Treatments / Results  Labs (all labs ordered are listed, but only abnormal results are displayed) Labs Reviewed - No data to display  Radiology Dg Foot 2 Views Left  Result Date: 05/14/2018 CLINICAL DATA:  Pt states walked on a piece of glass and both heels are hurting. Right heal hurts worse then the left and has progressively gotten worse EXAM: LEFT FOOT - 2 VIEW COMPARISON:  None. FINDINGS: Subacute fracture across the neck of the third metatarsal, minimally displaced, with moderate amount of surrounding partially calcified callus. Diffuse osteopenia. No acute fracture, dislocation. Small calcaneal spurs. No radiodense foreign body. IMPRESSION: 1. No acute findings. 2. Healing fracture of third metatarsal. Electronically Signed   By: Lucrezia Europe M.D.   On: 05/14/2018 14:55   Dg Foot Complete Right  Result Date:  05/14/2018 CLINICAL DATA:  Pt states walked on a piece of glass and both heels are hurting. Right heal hurts worse then the left and has progressively gotten worse EXAM: RIGHT FOOT COMPLETE - 3+ VIEW COMPARISON:  None. FINDINGS: There is no evidence of fracture or dislocation. There is no evidence of arthropathy or other focal bone abnormality. Soft tissues are unremarkable. No radiodense foreign body. IMPRESSION: Negative. Electronically Signed   By: Lucrezia Europe M.D.   On: 05/14/2018 14:55    Procedures Procedures (including critical care time)  Medications Ordered in ED Medications - No data to display   Initial Impression / Assessment and Plan / ED Course  I have reviewed the triage vital signs and the nursing notes. 52 y.o. female here for pain to the left foot that has been ongoing for 3 weeks stable for d/c with fracture noted that is healing to the third MT of the left foot. Patient has a Cam Walker at home that she will continue to use until her f/u appointment with Dr. Percell Miller. Right foot with bandage to the heel where ? F/b removed at Urgent Care per patient and she request no further evaluation of that at this time. Patient d/c to home.  Final Clinical Impressions(s) / ED Diagnoses   Final diagnoses:  Closed fracture of third metatarsal bone, initial encounter    ED Discharge Orders    None       Debroah Baller [place], NP 05/14/18 2300    Lacretia Leigh, MD 05/15/18 [phone]

## 2018-05-23 DIAGNOSIS — S92335A Nondisplaced fracture of third metatarsal bone, left foot, initial encounter for closed fracture: Secondary | ICD-10-CM | POA: Diagnosis not present

## 2018-05-29 DIAGNOSIS — N390 Urinary tract infection, site not specified: Secondary | ICD-10-CM | POA: Diagnosis not present

## 2018-05-31 ENCOUNTER — Ambulatory Visit (INDEPENDENT_AMBULATORY_CARE_PROVIDER_SITE_OTHER): Payer: Medicare HMO | Admitting: Psychiatry

## 2018-05-31 ENCOUNTER — Encounter: Payer: Self-pay | Admitting: Psychiatry

## 2018-05-31 VITALS — BP 103/68 | HR 116

## 2018-05-31 DIAGNOSIS — F319 Bipolar disorder, unspecified: Secondary | ICD-10-CM

## 2018-05-31 DIAGNOSIS — F5105 Insomnia due to other mental disorder: Secondary | ICD-10-CM | POA: Diagnosis not present

## 2018-05-31 DIAGNOSIS — F411 Generalized anxiety disorder: Secondary | ICD-10-CM

## 2018-05-31 MED ORDER — GABAPENTIN 800 MG PO TABS: 800.0000 mg | ORAL_TABLET | Freq: Three times a day (TID) | ORAL | 5 refills | Status: DC

## 2018-05-31 MED ORDER — HYDROXYZINE HCL 25 MG PO TABS: 50.0000 mg | ORAL_TABLET | Freq: Three times a day (TID) | ORAL | 5 refills | Status: DC

## 2018-05-31 MED ORDER — OLANZAPINE 15 MG PO TABS: 15.0000 mg | ORAL_TABLET | Freq: Every day | ORAL | 1 refills | Status: DC

## 2018-05-31 MED ORDER — BUSPIRONE HCL 15 MG PO TABS: 15.0000 mg | ORAL_TABLET | Freq: Two times a day (BID) | ORAL | 1 refills | Status: DC

## 2018-05-31 MED ORDER — ZIPRASIDONE HCL 80 MG PO CAPS: 80.0000 mg | ORAL_CAPSULE | Freq: Every day | ORAL | 5 refills | Status: DC

## 2018-05-31 MED ORDER — QUETIAPINE FUMARATE 400 MG PO TABS: 800.0000 mg | ORAL_TABLET | Freq: Every day | ORAL | 0 refills | Status: DC

## 2018-05-31 NOTE — Progress Notes (Signed)
[facility] MD/PA/NP Initial Note  05/31/2018 3:52 PM Robin Welch  MRN:  [id number]  Chief Complaint:  Chief Complaint    Anxiety; Insomnia     HPI: "I don't really want to change my medicine at all." She reports "memory loss" and describes losing train of thought, such as picking up her phone and not remembering what she was going to do. Daughter got married last weekend and pt reports that she had to be around her ex-husband and "it didn't bother me a bit" whereas this would have triggered mood and anxiety. Reports her mood has been stable. Denies depressed mood or irritability. She reports experiencing some anxiety "but I have been able to handle it on my own." Denies any recent panic attacks. Sleeping well. Appetite is stable. Energy and motivation has been adequate. Has not worked for the last 2 weeks due to vacation and daughter's wedding. Some anxiety about returning to work after taking 2 weeks off. Denies any recent manic s/s. Denies SI.  Visit Diagnosis:    ICD-10-CM   1. Bipolar I disorder (HCC) F31.9 OLANZapine (ZYPREXA) 15 MG tablet    gabapentin (NEURONTIN) 800 MG tablet    ziprasidone (GEODON) 80 MG capsule  2. Generalized anxiety disorder F41.1 busPIRone (BUSPAR) 15 MG tablet    gabapentin (NEURONTIN) 800 MG tablet    hydrOXYzine (ATARAX/VISTARIL) 25 MG tablet  3. Insomnia due to mental disorder F51.05 OLANZapine (ZYPREXA) 15 MG tablet  4. Bipolar 1 disorder (HCC) F31.9 QUEtiapine (SEROQUEL) 400 MG tablet    Past Psychiatric History:   Past Medical History:  Past Medical History:  Diagnosis Date  . Anxiety   . Bipolar 1 disorder [facility])    sees Dr. Candis Schatz psych [phone])  . COPD (chronic obstructive pulmonary disease) ([place]) 03/2013   hyperinflation by CXR  . History of cocaine dependence ([place]) latest 2017   undergoing detox  . History of stomach ulcers   . Hx: UTI (urinary tract infection)   . Migraine    "stopped in ~ 2008" (03/11/2017)  . 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 Seizure ([place]) X 1   "from takiing too many headache medicine?" (03/11/2017)  . Smoker     Past Surgical History:  Procedure Laterality Date  . DILATION AND CURETTAGE OF UTERUS  X 1   S/P miscarriage  . ESOPHAGOGASTRODUODENOSCOPY  03/2017   candidal esophagitis, treated, LA grade D esophagitis, 4cm HH (Nandigam)  . LAPAROSCOPIC CHOLECYSTECTOMY  2006  . TUBAL LIGATION  2003    Family History:  Family History  Problem Relation Age of Onset  . Diabetes Mother        T2DM  . Breast cancer Mother 66  . Dementia Mother   . Lymphoma Mother   . Coronary artery disease Father 25       MI  . Anxiety disorder Paternal Aunt   . Anxiety disorder Cousin   . Depression Cousin   . ADD / ADHD Child   . Anxiety disorder Child   . Depression Child   . Stroke Neg Hx   . Colon cancer Neg Hx   . Esophageal cancer Neg Hx   . Cancer - Colon Neg Hx     Social History:  Social History   Socioeconomic History  . Marital status: Divorced    Spouse name: Not on file  . Number of children: 2  . Years of education: bachelors  . Highest education level: Not on file  Occupational History  . 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 Financial resource strain: Not on file  . Food insecurity:    Worry: Not on file    Inability: Not on file  . Transportation needs:    Medical: Not on file    Non-medical: Not on file  Tobacco Use  . Smoking status: Current Every Day Smoker    Packs/day: 1.00    Years: 36.00    Pack years: 36.00    Types: Cigarettes  . Smokeless tobacco: Never Used  Substance and Sexual Activity  . Alcohol use: Not Currently    Alcohol/week: 0.0 standard drinks    Comment: 03/11/2017 "stopped 09/18/2015; never an alcoholic"  . Drug use: Not Currently    Comment: 03/11/2017 "stopped 09/18/2015"  . Sexual activity: Never    Birth control/protection: Surgical   Lifestyle  . Physical activity:    Days per week: Not on file    Minutes per session: Not on file  . Stress: Not on file  Relationships  . Social connections:    Talks on phone: Not on file    Gets together: Not on file    Attends religious service: Not on file    Active member of club or organization: Not on file    Attends meetings of clubs or organizations: Not on file    Relationship status: Not on file  Other Topics Concern  . Not on file  Social History Narrative   Caffeine: 40 oz diet mountain dew/day   Lives with husband and 2 children, no pets       Allergies:  Allergies  Allergen Reactions  . Prozac [Fluoxetine Hcl] Swelling and Rash  . Doxycycline Nausea And Vomiting  . Celexa [Citalopram Hydrobromide] Other (See Comments)    Possible allergic reaction  . Chantix [Varenicline Tartrate] Swelling    Mouth swelling  . Hydroxyzine     Not effective  . Klonopin [Clonazepam]     Caused instablility. Not able to drive due to over medicated  . Lunesta [Eszopiclone]     Not effective  . Sonata [Zaleplon]     Not effective  . Trazodone And Nefazodone     ineffective  . Zolpimist [Zolpidem Tartrate]     Not effective  . Azithromycin     Reaction not recalled by the patient    Metabolic Disorder Labs: Lab Results  Component Value Date   HGBA1C 5.4 12/28/2012   No results found for: PROLACTIN Lab Results  Component Value Date   CHOL 216 (H) 12/28/2012   TRIG 68.0 12/28/2012   HDL 81.50 12/28/2012   CHOLHDL 3 12/28/2012   VLDL 13.6 12/28/2012   Lab Results  Component Value Date   TSH 0.94 03/28/2018   TSH 1.35 03/01/2017    Therapeutic Level Labs: Lab Results  Component Value Date   LITHIUM 1.43 (H) 12/27/2010   LITHIUM 0.62 (L) 12/18/2010   No results found for: VALPROATE No components found for:  CBMZ  Current Medications: Current Outpatient Medications  Medication Sig Dispense Refill  . busPIRone (BUSPAR) 15 MG tablet Take 1 tablet (15  mg total) by mouth 2 (two) times daily. 180 tablet 1  . gabapentin (NEURONTIN) 800 MG tablet Take 1 tablet (800 mg total) by mouth 3 (three) times daily. 90 tablet 5  . hydrOXYzine (ATARAX/VISTARIL) 25 MG tablet Take 2 tablets (50 mg total) by  mouth 3 (three) times daily. 120 tablet 5  . OLANZapine (ZYPREXA) 15 MG tablet Take 1 tablet (15 mg total) by mouth at bedtime. 90 tablet 1  . ondansetron (ZOFRAN-ODT) 4 MG disintegrating tablet Take 1 tablet (4 mg total) by mouth every 8 (eight) hours as needed for nausea or vomiting. 20 tablet 2  . pantoprazole (PROTONIX) 40 MG tablet Take 1 tablet (40 mg total) by mouth 2 (two) times daily before a meal. 60 tablet 11  . QUEtiapine (SEROQUEL) 400 MG tablet Take 2 tablets (800 mg total) by mouth at bedtime. 180 tablet 0  . SF 5000 PLUS 1.1 % CREA dental cream Place 1 application onto teeth at bedtime.   2  . Tiotropium Bromide Monohydrate (SPIRIVA RESPIMAT) 2.5 MCG/ACT AERS Inhale 1 puff into the lungs daily. 4 g 6  . VENTOLIN HFA 108 (90 Base) MCG/ACT inhaler INHALE TWO puffs into THE lungs EVERY SIX HOURS AS NEEDED wheezind OR SHORTNESS OF BREATH (Patient taking differently: Inhale 1-2 puffs into the lungs every 4 (four) hours as needed for wheezing. ) 18 g 3  . ziprasidone (GEODON) 80 MG capsule Take 1 capsule (80 mg total) by mouth daily. 30 capsule 5  . acetaminophen (TYLENOL) 500 MG tablet Take 1,000 mg by mouth every 6 (six) hours as needed for mild pain or moderate pain.    Marland Kitchen sucralfate (CARAFATE) 1 g tablet Take 1 tablet (1 g total) by mouth 3 (three) times daily with meals. (Patient not taking: Reported on 05/31/2018) 60 tablet 0   No current facility-administered medications for this visit.    Medication Side Effects: Other: Possible concentration difficulties    Orders placed this visit:  No orders of the defined types were placed in this encounter.   Psychiatric Specialty Exam: Review of Systems  Constitutional: Negative.   HENT: Negative.    Gastrointestinal: Positive for vomiting.       Reports that she has an endoscopy and colonoscopy scheduled    Blood pressure 103/68, pulse (!) 116, last menstrual period 04/23/2015.There is no height or weight on file to calculate BMI.  General Appearance: Casual and Disheveled  Eye Contact:  Good  Speech:  garbled  Volume:  Increased  Mood:  Anxious  Affect:  Appropriate  Thought Process:  Coherent  Orientation:  Full (Time, Place, and Person)  Thought Content: Logical   Suicidal Thoughts:  No  Homicidal Thoughts:  No  Memory:  Immediate  Judgement:  Good  Insight:  Good  Psychomotor Activity:  Normal  Concentration:  Concentration: Fair  Recall:  [place] of Knowledge: Good  Language: Good  Akathisia:  No  AIMS (if indicated): not done  Assets:  Desire for Improvement  ADL's:  Intact  Cognition: WNL  Prognosis:  Fair   Screenings:  PHQ2-9     Office Visit from 06/21/2017 in [place] at [facility] Visit from 12/07/2014 in [place] at [facility]  PHQ-2 Total Score  0  2       Treatment Plan/Recommendations: Discussed that Buspar would be more effective taken twice daily and will therefore change to 15 mg po BID. Discussed taking Vistaril and Gabapentin at lowest effective doses for anxiety to minimize risk of concentration difficulties. Will continue Seroquel, Geodon, and Olanzapine for mood and insomnia since these s/s have worsened with attempts to reduce these medications.    Thayer Headings, PMHNP

## 2018-06-06 ENCOUNTER — Encounter: Payer: Self-pay | Admitting: Gastroenterology

## 2018-06-06 ENCOUNTER — Encounter

## 2018-06-07 DIAGNOSIS — M810 Age-related osteoporosis without current pathological fracture: Secondary | ICD-10-CM | POA: Diagnosis not present

## 2018-06-07 LAB — HM DEXA SCAN: HM Dexa Scan: -2.5

## 2018-06-09 ENCOUNTER — Telehealth: Payer: Self-pay | Admitting: Family Medicine

## 2018-06-09 ENCOUNTER — Encounter: Payer: Self-pay | Admitting: Family Medicine

## 2018-06-09 DIAGNOSIS — M810 Age-related osteoporosis without current pathological fracture: Secondary | ICD-10-CM | POA: Insufficient documentation

## 2018-06-09 HISTORY — DX: Age-related osteoporosis without current pathological fracture: M81.0

## 2018-06-09 NOTE — Telephone Encounter (Signed)
plz notify I received bone density scan showing osteoporosis - would offer OV to review treatment options, would also recommend she come in for physical as overdue.

## 2018-06-09 NOTE — Telephone Encounter (Signed)
Spoke with pt relaying results and message per Dr. Darnell Level.  Pt scheduled OV on 06/14/18 at 12:15 PM to discuss tx options.  Will have to check her schedule to make wellness visit.

## 2018-06-13 ENCOUNTER — Encounter: Payer: Self-pay | Admitting: Family Medicine

## 2018-06-13 ENCOUNTER — Telehealth: Payer: Self-pay | Admitting: Family Medicine

## 2018-06-13 ENCOUNTER — Encounter (INDEPENDENT_AMBULATORY_CARE_PROVIDER_SITE_OTHER): Payer: Self-pay

## 2018-06-13 ENCOUNTER — Ambulatory Visit (INDEPENDENT_AMBULATORY_CARE_PROVIDER_SITE_OTHER): Payer: Medicare HMO | Admitting: Family Medicine

## 2018-06-13 VITALS — BP 118/64 | HR 118 | Temp 97.8°F | Ht 62.0 in | Wt 128.5 lb

## 2018-06-13 DIAGNOSIS — M810 Age-related osteoporosis without current pathological fracture: Secondary | ICD-10-CM

## 2018-06-13 DIAGNOSIS — R112 Nausea with vomiting, unspecified: Secondary | ICD-10-CM | POA: Diagnosis not present

## 2018-06-13 DIAGNOSIS — D509 Iron deficiency anemia, unspecified: Secondary | ICD-10-CM | POA: Diagnosis not present

## 2018-06-13 DIAGNOSIS — S92335A Nondisplaced fracture of third metatarsal bone, left foot, initial encounter for closed fracture: Secondary | ICD-10-CM | POA: Insufficient documentation

## 2018-06-13 DIAGNOSIS — E8809 Other disorders of plasma-protein metabolism, not elsewhere classified: Secondary | ICD-10-CM

## 2018-06-13 DIAGNOSIS — J449 Chronic obstructive pulmonary disease, unspecified: Secondary | ICD-10-CM | POA: Diagnosis not present

## 2018-06-13 DIAGNOSIS — K219 Gastro-esophageal reflux disease without esophagitis: Secondary | ICD-10-CM

## 2018-06-13 DIAGNOSIS — E871 Hypo-osmolality and hyponatremia: Secondary | ICD-10-CM

## 2018-06-13 LAB — CBC WITH DIFFERENTIAL/PLATELET
BASOS ABS: 0.1 10*3/uL (ref 0.0–0.1)
Basophils Relative: 0.9 % (ref 0.0–3.0)
Eosinophils Absolute: 0.1 10*3/uL (ref 0.0–0.7)
Eosinophils Relative: 2.2 % (ref 0.0–5.0)
HEMATOCRIT: 31.9 % — AB (ref 36.0–46.0)
Hemoglobin: 10.2 g/dL — ABNORMAL LOW (ref 12.0–15.0)
LYMPHS PCT: 29.2 % (ref 12.0–46.0)
Lymphs Abs: 1.8 10*3/uL (ref 0.7–4.0)
MCHC: 31.9 g/dL (ref 30.0–36.0)
MCV: 79.2 fl (ref 78.0–100.0)
MONOS PCT: 13.8 % — AB (ref 3.0–12.0)
Monocytes Absolute: 0.8 10*3/uL (ref 0.1–1.0)
Neutro Abs: 3.3 10*3/uL (ref 1.4–7.7)
Neutrophils Relative %: 53.9 % (ref 43.0–77.0)
Platelets: 696 10*3/uL — ABNORMAL HIGH (ref 150.0–400.0)
RBC: 4.03 Mil/uL (ref 3.87–5.11)
RDW: 23.5 % — ABNORMAL HIGH (ref 11.5–15.5)
WBC: 6 10*3/uL (ref 4.0–10.5)

## 2018-06-13 LAB — BASIC METABOLIC PANEL
BUN: 2 mg/dL — AB (ref 6–23)
CHLORIDE: 94 meq/L — AB (ref 96–112)
CO2: 29 mEq/L (ref 19–32)
CREATININE: 0.58 mg/dL (ref 0.40–1.20)
Calcium: 8.1 mg/dL — ABNORMAL LOW (ref 8.4–10.5)
GFR: 114.81 mL/min (ref >60.00)
Glucose, Bld: 93 mg/dL (ref 70–99)
Potassium: 3.5 mEq/L (ref 3.5–5.1)
Sodium: 128 mEq/L — ABNORMAL LOW (ref 135–145)

## 2018-06-13 LAB — FERRITIN: FERRITIN: 14.7 ng/mL (ref 10.0–291.0)

## 2018-06-13 LAB — VITAMIN D 25 HYDROXY (VIT D DEFICIENCY, FRACTURES): VITD: 41.49 ng/mL (ref 30.00–100.00)

## 2018-06-13 LAB — IBC PANEL
IRON: 16 ug/dL — AB (ref 42–145)
SATURATION RATIOS: 4.1 % — AB (ref 20.0–50.0)
Transferrin: 282 mg/dL (ref 212.0–360.0)

## 2018-06-13 MED ORDER — CALCIUM 500 MG PO CHEW: 1.0000 | CHEWABLE_TABLET | Freq: Two times a day (BID) | ORAL | Status: DC

## 2018-06-13 MED ORDER — VITAMIN D 50 MCG (2000 UT) PO CAPS: 1.0000 | ORAL_CAPSULE | Freq: Every day | ORAL | Status: DC

## 2018-06-13 NOTE — Assessment & Plan Note (Signed)
She finds spiriva is helping with her chronic dyspnea - will continue this. Overdue for spirometry.

## 2018-06-13 NOTE — Progress Notes (Signed)
BP 118/64 (BP Location: Left Arm, Patient Position: Sitting, Cuff Size: Normal)   Pulse (!) 118   Temp 97.8 F (36.6 C) (Oral)   Ht 5\' 2"  (1.575 m)   Wt 128 lb 8 oz (58.3 kg)   LMP 04/23/2015 (Approximate)   SpO2 99%   BMI 23.50 kg/m    CC: osteoporosis treatment options Subjective:    Patient ID: Robin Welch, female    DOB: [birth year], 52 y.o.   MRN: [id number]  HPI: Robin Welch is a 52 y.o. female presenting on 06/13/2018 for Treatment Planning (Here to discuss tx options for osteoporosis.)   Recent dexa 05/2018 showing T score -2.5.   Recent bone fracture seen at ER - subacute fracture across 3rd MT neck. Treated with CAM walker boot, saw Dr Alfonso Ramus, told she was healing well. She notes continued pain at site with prolonged walking.   Also seen at ER for pleuritic chest pain 05/05/2018 - note reviewed. reassuring evaluation.   Upcoming appt 06/28/2018 for EGD/colonoscopy.   Relevant past medical, surgical, family and social history reviewed and updated as indicated. Interim medical history since our last visit reviewed. Allergies and medications reviewed and updated. Outpatient Medications Prior to Visit  Medication Sig Dispense Refill  . acetaminophen (TYLENOL) 500 MG tablet Take 1,000 mg by mouth every 6 (six) hours as needed for mild pain or moderate pain.    . busPIRone (BUSPAR) 15 MG tablet Take 1 tablet (15 mg total) by mouth 2 (two) times daily. 180 tablet 1  . gabapentin (NEURONTIN) 800 MG tablet Take 1 tablet (800 mg total) by mouth 3 (three) times daily. 90 tablet 5  . hydrOXYzine (ATARAX/VISTARIL) 25 MG tablet Take 2 tablets (50 mg total) by mouth 3 (three) times daily. 120 tablet 5  . OLANZapine (ZYPREXA) 15 MG tablet Take 1 tablet (15 mg total) by mouth at bedtime. 90 tablet 1  . ondansetron (ZOFRAN-ODT) 4 MG disintegrating tablet Take 1 tablet (4 mg total) by mouth every 8 (eight) hours as needed for nausea or vomiting. 20 tablet 2  . pantoprazole (PROTONIX) 40 MG  tablet Take 1 tablet (40 mg total) by mouth 2 (two) times daily before a meal. 60 tablet 11  . QUEtiapine (SEROQUEL) 400 MG tablet Take 2 tablets (800 mg total) by mouth at bedtime. 180 tablet 0  . SF 5000 PLUS 1.1 % CREA dental cream Place 1 application onto teeth at bedtime.   2  . sucralfate (CARAFATE) 1 g tablet Take 1 tablet (1 g total) by mouth 3 (three) times daily with meals. 60 tablet 0  . Tiotropium Bromide Monohydrate (SPIRIVA RESPIMAT) 2.5 MCG/ACT AERS Inhale 1 puff into the lungs daily. 4 g 6  . VENTOLIN HFA 108 (90 Base) MCG/ACT inhaler INHALE TWO puffs into THE lungs EVERY SIX HOURS AS NEEDED wheezind OR SHORTNESS OF BREATH (Patient taking differently: Inhale 1-2 puffs into the lungs every 4 (four) hours as needed for wheezing. ) 18 g 3  . ziprasidone (GEODON) 80 MG capsule Take 1 capsule (80 mg total) by mouth daily. 30 capsule 5   No facility-administered medications prior to visit.      Per HPI unless specifically indicated in ROS section below Review of Systems     Objective:    BP 118/64 (BP Location: Left Arm, Patient Position: Sitting, Cuff Size: Normal)   Pulse (!) 118   Temp 97.8 F (36.6 C) (Oral)   Ht 5\' 2"  (1.575 m)   Wt 128 lb 8  oz (58.3 kg)   LMP 04/23/2015 (Approximate)   SpO2 99%   BMI 23.50 kg/m   Wt Readings from Last 3 Encounters:  06/13/18 128 lb 8 oz (58.3 kg)  05/05/18 128 lb (58.1 kg)  04/19/18 124 lb (56.2 kg)    Physical Exam  Constitutional: She appears well-developed and well-nourished. No distress.  HENT:  Mouth/Throat: Oropharynx is clear and moist. No oropharyngeal exudate.  Eyes: Pupils are equal, round, and reactive to light. EOM are normal.  Cardiovascular: Regular rhythm and normal heart sounds. Tachycardia present.  No murmur heard. Pulmonary/Chest: Effort normal and breath sounds normal. No respiratory distress. She has no wheezes. She has no rales.  Improved breathing compared to prior exams  Nursing note and vitals  reviewed.  Results for orders placed or performed in visit on 06/09/18  HM DEXA SCAN  Result Value Ref Range   HM Dexa Scan -2.5       Assessment & Plan:   Problem List Items Addressed This Visit    Osteoporosis - Primary    Discussed recent diagnosis by DEXA scan as well as treatment options. Reviewed recommended daily amt cal/vit D as well as importance of weight bearing activity. She is not good oral bisphosphonate candidate due to GI history. Will ask to price out prolia cost for patient.       Relevant Medications   Calcium 500 MG CHEW   Cholecalciferol (VITAMIN D) 2000 units CAPS   Other Relevant Orders   VITAMIN D 25 Hydroxy (Vit-D Deficiency, Fractures)   Non-intractable vomiting with nausea    Ongoing. Appreciate GI care. Upcoming EGD/colonoscopy.       Iron deficiency anemia    She did well with recent feraheme infusion - will update labs today. Does not tolerate oral iron.       Relevant Orders   CBC with Differential/Platelet   Ferritin   IBC panel   Hyponatremia   Relevant Orders   Basic metabolic panel   Gastroesophageal reflux disease   COPD (chronic obstructive pulmonary disease) with chronic bronchitis ([place])    She finds spiriva is helping with her chronic dyspnea - will continue this. Overdue for spirometry.       Closed nondisplaced fracture of third metatarsal bone of left foot    For ongoing pain, will place in post-op hard soled shoe to use for next 2 wks.           Meds ordered this encounter  Medications  . Calcium 500 MG CHEW    Sig: Chew 1 tablet (500 mg total) by mouth 2 (two) times daily.  . Cholecalciferol (VITAMIN D) 2000 units CAPS    Sig: Take 1 capsule (2,000 Units total) by mouth daily.    Dispense:  30 capsule   Orders Placed This Encounter  Procedures  . CBC with Differential/Platelet  . Basic metabolic panel  . Ferritin  . IBC panel  . VITAMIN D 25 Hydroxy (Vit-D Deficiency, Fractures)    Follow up plan: Return if  symptoms worsen or fail to improve.  Ria Bush, MD

## 2018-06-13 NOTE — Assessment & Plan Note (Addendum)
Discussed recent diagnosis by DEXA scan as well as treatment options. Reviewed recommended daily amt cal/vit D as well as importance of weight bearing activity. She is not good oral bisphosphonate candidate due to GI history. Will ask to price out prolia cost for patient.

## 2018-06-13 NOTE — Assessment & Plan Note (Signed)
For ongoing pain, will place in post-op hard soled shoe to use for next 2 wks.

## 2018-06-13 NOTE — Assessment & Plan Note (Signed)
Ongoing. Appreciate GI care. Upcoming EGD/colonoscopy.

## 2018-06-13 NOTE — Telephone Encounter (Signed)
plz check on cost of prolia for patient. Thank you.

## 2018-06-13 NOTE — Patient Instructions (Addendum)
Labs today Wear post op shoe for 2 more weeks - provided today.  For osteoporosis: Try to get most or all of your calcium from your food--aim for 1000 mg/day for women up to 61 and men up to 70 and 1200 mg/day for women over 60 and men over 70. To figure out dietary calcium: 300 mg/day from all non dairy foods plus 300 mg per cup of milk, other dairy, or fortified juice. Non dairy foods that contain calcium: Kale, oranges, sardines, oatmeal, soy milk/soybeans, salmon, Stettler beans, dried figs, turnip greens, almonds, broccoli, tofu.   - if you're not getting enough calcium in the diet, take calcium chewable twice daily.  - start vitamin D 2000 units daily.  - work on [employer] bearing exercise (walking).   We will price out prolia for you and contact you with estimated cost.  Osteoporosis Osteoporosis is the thinning and loss of density in the bones. Osteoporosis makes the bones more brittle, fragile, and likely to break (fracture). Over time, osteoporosis can cause the bones to become so weak that they fracture after a simple fall. The bones most likely to fracture are the bones in the hip, wrist, and spine. What are the causes? The exact cause is not known. What increases the risk? Anyone can develop osteoporosis. You may be at greater risk if you have a family history of the condition or have poor nutrition. You may also have a higher risk if you are:  Female.  54 years old or older.  A smoker.  Not physically active.  [place] or Asian.  Slender.  What are the signs or symptoms? A fracture might be the first sign of the disease, especially if it results from a fall or injury that would not usually cause a bone to break. Other signs and symptoms include:  Low back and neck pain.  Stooped posture.  Height loss.  How is this diagnosed? To make a diagnosis, your health care provider may:  Take a medical history.  Perform a physical exam.  Order tests, such as: ? A bone  mineral density test. ? A dual-energy X-ray absorptiometry test.  How is this treated? The goal of osteoporosis treatment is to strengthen your bones to reduce your risk of a fracture. Treatment may involve:  Making lifestyle changes, such as: ? Eating a diet rich in calcium. ? Doing weight-bearing and muscle-strengthening exercises. ? Stopping tobacco use. ? Limiting alcohol intake.  Taking medicine to slow the process of bone loss or to increase bone density.  Monitoring your levels of calcium and vitamin D.  Follow these instructions at home:  Include calcium and vitamin D in your diet. Calcium is important for bone health, and vitamin D helps the body absorb calcium.  Perform weight-bearing and muscle-strengthening exercises as directed by your health care provider.  Do not use any tobacco products, including cigarettes, chewing tobacco, and electronic cigarettes. If you need help quitting, ask your health care provider.  Limit your alcohol intake.  Take medicines only as directed by your health care provider.  Keep all follow-up visits as directed by your health care provider. This is important.  Take precautions at home to lower your risk of falling, such as: ? Keeping rooms well lit and clutter free. ? Installing safety rails on stairs. ? Using rubber mats in the bathroom and other areas that are often wet or slippery. Get help right away if: You fall or injure yourself. This information is not intended to replace advice  given to you by your health care provider. Make sure you discuss any questions you have with your health care provider. Document Released: 05/27/2005 Document Revised: 01/20/2016 Document Reviewed: 01/25/2014 Elsevier Interactive Patient Education  [employer].

## 2018-06-13 NOTE — Assessment & Plan Note (Signed)
She did well with recent feraheme infusion - will update labs today. Does not tolerate oral iron.

## 2018-06-14 ENCOUNTER — Encounter: Payer: Self-pay | Admitting: Gastroenterology

## 2018-06-14 ENCOUNTER — Ambulatory Visit: Payer: Self-pay | Admitting: Family Medicine

## 2018-06-15 ENCOUNTER — Telehealth: Payer: Self-pay | Admitting: Family Medicine

## 2018-06-15 NOTE — Telephone Encounter (Signed)
Copied from [place] [id number]. Topic: Quick Communication - See Telephone Encounter >> Jun 15, 2018  4:47 PM Vernona Rieger wrote: CRM for notification. See Telephone encounter for: 06/15/18.  Patient is requesting her lab results from Monday, October 14th. Please contact patient.

## 2018-06-16 ENCOUNTER — Telehealth: Payer: Self-pay | Admitting: Family Medicine

## 2018-06-16 ENCOUNTER — Other Ambulatory Visit: Payer: Self-pay | Admitting: Family Medicine

## 2018-06-16 DIAGNOSIS — D473 Essential (hemorrhagic) thrombocythemia: Secondary | ICD-10-CM

## 2018-06-16 DIAGNOSIS — D75839 Thrombocytosis, unspecified: Secondary | ICD-10-CM

## 2018-06-16 DIAGNOSIS — E871 Hypo-osmolality and hyponatremia: Secondary | ICD-10-CM

## 2018-06-16 NOTE — Telephone Encounter (Signed)
Copied from [place] [id number]. Topic: Quick Communication - Lab Results (Clinic Use ONLY) >> Jun 16, 2018  4:12 AM Brenton Grills, CMA wrote: Called patient to inform them of 06/13/18 lab results. When patient returns call, triage nurse may disclose results and schedule lab visit.

## 2018-06-16 NOTE — Telephone Encounter (Signed)
See result note.  

## 2018-06-16 NOTE — Telephone Encounter (Signed)
Charted in result notes. 

## 2018-06-16 NOTE — Telephone Encounter (Signed)
Copied from [place] [id number]. Topic: Quick Communication - See Telephone Encounter >> Jun 16, 2018  4:56 PM Blase Mess A wrote: CRM for notification. See Telephone encounter for: 06/16/18.  Patinet is requesting Pap and discuss ovarian cancer with a female Appt is scheduled for 06/22/18. She is a patient of Dr. Darnell Level.

## 2018-06-16 NOTE — Telephone Encounter (Signed)
Pt given results  

## 2018-06-17 ENCOUNTER — Other Ambulatory Visit (INDEPENDENT_AMBULATORY_CARE_PROVIDER_SITE_OTHER): Payer: Medicare HMO

## 2018-06-17 ENCOUNTER — Ambulatory Visit: Payer: Self-pay | Admitting: *Deleted

## 2018-06-17 DIAGNOSIS — D649 Anemia, unspecified: Secondary | ICD-10-CM

## 2018-06-17 DIAGNOSIS — D473 Essential (hemorrhagic) thrombocythemia: Secondary | ICD-10-CM

## 2018-06-17 DIAGNOSIS — D75839 Thrombocytosis, unspecified: Secondary | ICD-10-CM

## 2018-06-17 DIAGNOSIS — D509 Iron deficiency anemia, unspecified: Secondary | ICD-10-CM

## 2018-06-17 DIAGNOSIS — E871 Hypo-osmolality and hyponatremia: Secondary | ICD-10-CM

## 2018-06-17 LAB — BASIC METABOLIC PANEL
BUN: 4 mg/dL — ABNORMAL LOW (ref 6–23)
CALCIUM: 8.1 mg/dL — AB (ref 8.4–10.5)
CO2: 24 mEq/L (ref 19–32)
Chloride: 96 mEq/L (ref 96–112)
Creatinine, Ser: 0.7 mg/dL (ref 0.40–1.20)
GFR: 92.41 mL/min (ref >60.00)
GLUCOSE: 136 mg/dL — AB (ref 70–99)
Potassium: 3.5 mEq/L (ref 3.5–5.1)
SODIUM: 129 meq/L — AB (ref 135–145)

## 2018-06-17 NOTE — Telephone Encounter (Signed)
I spoke with Robin Welch; Robin Welch has already left from having labs. Robin Welch said she has had confusion and memory loss for 6 months but Robin Welch thought would be better by now. Also new symptom for 2 days; Robin Welch experiencing both ankles swelling. No difficulty breathing and no pain in ankles. Robin Welch will keep feet up when sitting and if develops difficulty breathing prior to 30' appt on 06/20/18 at 8 AM Robin Welch will go to [facility] or ED. FYI to Dr Darnell Level.

## 2018-06-17 NOTE — Telephone Encounter (Signed)
Information has been submitted to pts insurance for verification of benefits. Awaiting response for coverage  

## 2018-06-17 NOTE — Telephone Encounter (Signed)
She called in c/o memory loss and confusion that has been going on for about a month.   "I have bipolar and I think it's from my medications for anxiety and depression".  She has blood work scheduled this morning and is wanting to know if she can see Dr. Danise Mina when she comes in for the blood work.   The agent has sent a note asking the practice if they can work her in this morning with Dr. Danise Mina.  I let pt know someone from the office will be contacting her.  She also mentioned her ankles have been swollen for the last 2 days.   See triage notes.  I routed this note to Dr. Bosie Clos nurse pool for further disposition.       Reason for Disposition . [1] MILD swelling of both ankles (i.e., pedal edema) AND [2] new onset or worsening  Answer Assessment - Initial Assessment Questions 1. ONSET: "When did the swelling start?" (e.g., minutes, hours, days)     Noticed swollen ankles for last 2 days.   My left ankle was broken 2 months ago and I was in a boot but not now but now both ankles swollen.   I've been having memory lose for the last 3 months.  I thought it was from my antidepressant.      I'm suppose to have blood work this morning and want to see Dr. Darnell Level.   The agent has sent a note to see if they can work me in when I come in for my blood work this morning.    "I have bipolar".    2. LOCATION: "What part of the leg is swollen?"  "Are both legs swollen or just one leg?"     I don't know but I had on short socks yesterday and I had indention in my ankles when I took my socks off.    3. SEVERITY: "How bad is the swelling?" (e.g., localized; mild, moderate, severe)  - Localized - small area of swelling localized to one leg  - MILD pedal edema - swelling limited to foot and ankle, pitting edema < 1/4 inch (6 mm) deep, rest and elevation eliminate most or all swelling  - MODERATE edema - swelling of lower leg to knee, pitting edema > 1/4 inch (6 mm) deep, rest and elevation only  partially reduce swelling  - SEVERE edema - swelling extends above knee, facial or hand swelling present      No 4. REDNESS: "Does the swelling look red or infected?"     No 5. PAIN: "Is the swelling painful to touch?" If so, ask: "How painful is it?"   (Scale 1-10; mild, moderate or severe)     No 6. FEVER: "Do you have a fever?" If so, ask: "What is it, how was it measured, and when did it start?"      No 7. CAUSE: "What do you think is causing the leg swelling?"     I don't know 8. MEDICAL HISTORY: "Do you have a history of heart failure, kidney disease, liver failure, or cancer?"     I have bipolar.   My platelets are high is reason for the blood work this morning. 9. RECURRENT SYMPTOM: "Have you had leg swelling before?" If so, ask: "When was the last time?" "What happened that time?"     No 10. OTHER SYMPTOMS: "Do you have any other symptoms?" (e.g., chest pain, difficulty breathing)       No 11.  PREGNANCY: "Is there any chance you are pregnant?" "When was your last menstrual period?"       Not asked due to age  Protocols used: LEG SWELLING AND EDEMA-A-AH

## 2018-06-19 DIAGNOSIS — N390 Urinary tract infection, site not specified: Secondary | ICD-10-CM | POA: Diagnosis not present

## 2018-06-19 DIAGNOSIS — Z87440 Personal history of urinary (tract) infections: Secondary | ICD-10-CM | POA: Diagnosis not present

## 2018-06-19 NOTE — Telephone Encounter (Signed)
Noted  

## 2018-06-20 ENCOUNTER — Encounter: Payer: Self-pay | Admitting: Family Medicine

## 2018-06-20 ENCOUNTER — Ambulatory Visit (INDEPENDENT_AMBULATORY_CARE_PROVIDER_SITE_OTHER): Payer: Medicare HMO | Admitting: Family Medicine

## 2018-06-20 VITALS — BP 118/64 | HR 112 | Temp 97.8°F | Ht 62.0 in | Wt 129.5 lb

## 2018-06-20 DIAGNOSIS — R112 Nausea with vomiting, unspecified: Secondary | ICD-10-CM | POA: Diagnosis not present

## 2018-06-20 DIAGNOSIS — E871 Hypo-osmolality and hyponatremia: Secondary | ICD-10-CM

## 2018-06-20 DIAGNOSIS — D473 Essential (hemorrhagic) thrombocythemia: Secondary | ICD-10-CM | POA: Diagnosis not present

## 2018-06-20 DIAGNOSIS — R413 Other amnesia: Secondary | ICD-10-CM

## 2018-06-20 DIAGNOSIS — F319 Bipolar disorder, unspecified: Secondary | ICD-10-CM | POA: Diagnosis not present

## 2018-06-20 DIAGNOSIS — Z8719 Personal history of other diseases of the digestive system: Secondary | ICD-10-CM

## 2018-06-20 DIAGNOSIS — M810 Age-related osteoporosis without current pathological fracture: Secondary | ICD-10-CM | POA: Diagnosis not present

## 2018-06-20 DIAGNOSIS — D509 Iron deficiency anemia, unspecified: Secondary | ICD-10-CM

## 2018-06-20 DIAGNOSIS — D75839 Thrombocytosis, unspecified: Secondary | ICD-10-CM

## 2018-06-20 LAB — EXTRA URINE SPECIMEN

## 2018-06-20 LAB — SODIUM, URINE, RANDOM: Sodium, Ur: 13 mmol/L — ABNORMAL LOW (ref 28–272)

## 2018-06-20 LAB — CBC
HCT: 31.7 % — ABNORMAL LOW (ref 35.0–45.0)
Hemoglobin: 10.1 g/dL — ABNORMAL LOW (ref 11.7–15.5)
MCH: 25.1 pg — AB (ref 27.0–33.0)
MCHC: 31.9 g/dL — ABNORMAL LOW (ref 32.0–36.0)
MCV: 78.7 fL — ABNORMAL LOW (ref 80.0–100.0)
MPV: 8 fL (ref 7.5–12.5)
PLATELETS: 621 10*3/uL — AB (ref 140–400)
RBC: 4.03 10*6/uL (ref 3.80–5.10)
RDW: 19.4 % — ABNORMAL HIGH (ref 11.0–15.0)
WBC: 8.9 10*3/uL (ref 3.8–10.8)

## 2018-06-20 LAB — OSMOLALITY, URINE: Osmolality, Ur: 45 mOsm/kg — ABNORMAL LOW (ref 50–1200)

## 2018-06-20 LAB — PATHOLOGIST SMEAR REVIEW

## 2018-06-20 MED ORDER — GABAPENTIN 300 MG PO CAPS: 600.0000 mg | ORAL_CAPSULE | Freq: Two times a day (BID) | ORAL | 3 refills | Status: DC

## 2018-06-20 NOTE — Assessment & Plan Note (Signed)
Pending periph smear - may refer to heme - discussed this.

## 2018-06-20 NOTE — Assessment & Plan Note (Addendum)
Chronic. Pt attributes this to high fluid intake - advised to back off her caffeinated beverages (mt dew and sweet tea). Pending Uosm

## 2018-06-20 NOTE — Assessment & Plan Note (Signed)
Awaiting prolia pricing.

## 2018-06-20 NOTE — Assessment & Plan Note (Signed)
Followed by psych Thayer Headings at [facility]), on 3 antipsychotics ?related to cloudy sensorium. She will touch base with psych if decreasing gabapentin doesn't help.

## 2018-06-20 NOTE — Assessment & Plan Note (Signed)
Unclear cause, anticipate med related. Reviewed this. She will try lower gabapentin dose, f/u with psych if no improvement.

## 2018-06-20 NOTE — Assessment & Plan Note (Signed)
Await EGD/colonsocopy - may repeat feraheme infusion pending results. Pt does not tolerate oral iron.

## 2018-06-20 NOTE — Progress Notes (Signed)
BP 118/64 (BP Location: Left Arm, Patient Position: Sitting, Cuff Size: Normal)   Pulse (!) 112   Temp 97.8 F (36.6 C) (Oral)   Ht 5\' 2"  (1.575 m)   Wt 129 lb 8 oz (58.7 kg)   LMP 04/23/2015 (Approximate)   SpO2 98%   BMI 23.69 kg/m    CC: f/u visit Subjective:    Patient ID: Robin Welch, female    DOB: 11-19-1965, 52 y.o.   MRN: [id number]  HPI: Robin Welch is a 52 y.o. female presenting on 06/20/2018 for Memory Loss (Co/ memory loss and confusion. Thinks it may be due to gabapentin. ) and Joint Swelling (C/o bilateral ankle swelling, left is worse. Started last week. )   See prior note for details.   Notes worsening memory trouble and confusion over the last 6 months - ie picking up phone then forgetting what she picked it up to do. Feels "cloudy". This is worrisome to her. Worried med related - has decreased gabapentin to 600mg  once to twice daily daily (has 300mg  tablets at home - requests refill). Was prior taking 800mg  bid. Taking gabapentin for anxiety (along with buspar 15mg  bid and vistaril 50mg  bid. Continues geodon, zyprexa, and seroquel through psych.   Hyponatremia - more chronic, may be med related. She feels she is drinking too much fluid especially sweet tea.   Thrombocytosis - progressive over the past year. Periph smear pending. She did have iron infusion 03/2018.    Prolia pricing pending.   Upcoming EGD for chronic n/v and GERD. She has been taking miralax. zofran has helped nausea.   Denies dyspnea, chest discomfort, or leg swelling.   Relevant past medical, surgical, family and social history reviewed and updated as indicated. Interim medical history since our last visit reviewed. Allergies and medications reviewed and updated. Outpatient Medications Prior to Visit  Medication Sig Dispense Refill  . acetaminophen (TYLENOL) 500 MG tablet Take 1,000 mg by mouth every 6 (six) hours as needed for mild pain or moderate pain.    . busPIRone (BUSPAR) 15 MG  tablet Take 1 tablet (15 mg total) by mouth 2 (two) times daily. 180 tablet 1  . Calcium 500 MG CHEW Chew 1 tablet (500 mg total) by mouth 2 (two) times daily.    . Cholecalciferol (VITAMIN D) 2000 units CAPS Take 1 capsule (2,000 Units total) by mouth daily. 30 capsule   . hydrOXYzine (ATARAX/VISTARIL) 25 MG tablet Take 2 tablets (50 mg total) by mouth 3 (three) times daily. 120 tablet 5  . OLANZapine (ZYPREXA) 15 MG tablet Take 1 tablet (15 mg total) by mouth at bedtime. 90 tablet 1  . ondansetron (ZOFRAN-ODT) 4 MG disintegrating tablet Take 1 tablet (4 mg total) by mouth every 8 (eight) hours as needed for nausea or vomiting. 20 tablet 2  . pantoprazole (PROTONIX) 40 MG tablet Take 1 tablet (40 mg total) by mouth 2 (two) times daily before a meal. 60 tablet 11  . QUEtiapine (SEROQUEL) 400 MG tablet Take 2 tablets (800 mg total) by mouth at bedtime. 180 tablet 0  . SF 5000 PLUS 1.1 % CREA dental cream Place 1 application onto teeth at bedtime.   2  . sucralfate (CARAFATE) 1 g tablet Take 1 tablet (1 g total) by mouth 3 (three) times daily with meals. 60 tablet 0  . Tiotropium Bromide Monohydrate (SPIRIVA RESPIMAT) 2.5 MCG/ACT AERS Inhale 1 puff into the lungs daily. 4 g 6  . VENTOLIN HFA 108 (90 Base) MCG/ACT  inhaler INHALE TWO puffs into THE lungs EVERY SIX HOURS AS NEEDED wheezind OR SHORTNESS OF BREATH (Patient taking differently: Inhale 1-2 puffs into the lungs every 4 (four) hours as needed for wheezing. ) 18 g 3  . ziprasidone (GEODON) 80 MG capsule Take 1 capsule (80 mg total) by mouth daily. 30 capsule 5  . gabapentin (NEURONTIN) 800 MG tablet Take 1 tablet (800 mg total) by mouth 3 (three) times daily. 90 tablet 5   No facility-administered medications prior to visit.      Per HPI unless specifically indicated in ROS section below Review of Systems     Objective:    BP 118/64 (BP Location: Left Arm, Patient Position: Sitting, Cuff Size: Normal)   Pulse (!) 112   Temp 97.8 F  (36.6 C) (Oral)   Ht 5\' 2"  (1.575 m)   Wt 129 lb 8 oz (58.7 kg)   LMP 04/23/2015 (Approximate)   SpO2 98%   BMI 23.69 kg/m   Wt Readings from Last 3 Encounters:  06/20/18 129 lb 8 oz (58.7 kg)  06/13/18 128 lb 8 oz (58.3 kg)  05/05/18 128 lb (58.1 kg)    Physical Exam  Constitutional: She appears well-developed and well-nourished. No distress.  Psychiatric: She has a normal mood and affect.  Nursing note and vitals reviewed.  Results for orders placed or performed in visit on 06/17/18  CBC  Result Value Ref Range   WBC 8.9 3.8 - 10.8 Thousand/uL   RBC 4.03 3.80 - 5.10 Million/uL   Hemoglobin 10.1 (L) 11.7 - 15.5 g/dL   HCT 31.7 (L) 35.0 - 45.0 %   MCV 78.7 (L) 80.0 - 100.0 fL   MCH 25.1 (L) 27.0 - 33.0 pg   MCHC 31.9 (L) 32.0 - 36.0 g/dL   RDW 19.4 (H) 11.0 - 15.0 %   Platelets 621 (H) 140 - 400 Thousand/uL   MPV 8.0 7.5 - 12.5 fL  Basic metabolic panel  Result Value Ref Range   Sodium 129 (L) 135 - 145 mEq/L   Potassium 3.5 3.5 - 5.1 mEq/L   Chloride 96 96 - 112 mEq/L   CO2 24 19 - 32 mEq/L   Glucose, Bld 136 (H) 70 - 99 mg/dL   BUN 4 (L) 6 - 23 mg/dL   Creatinine, Ser 0.70 0.40 - 1.20 mg/dL   Calcium 8.1 (L) 8.4 - 10.5 mg/dL   GFR 92.41 >60.00 mL/min  Sodium, urine, random  Result Value Ref Range   Sodium, Ur 13 (L) 28 - 272 mmol/L  Extra Urine Specimen  Result Value Ref Range   Extra Urine Specimen     Lab Results  Component Value Date   VITAMINB12 665 06/21/2017       Assessment & Plan:   Problem List Items Addressed This Visit    Thrombocytosis ([place])    Pending periph smear - may refer to heme - discussed this.       Osteoporosis    Awaiting prolia pricing.      Non-intractable vomiting with nausea    zofran helping - pending EGD/colonoscopy next week.       Memory loss - Primary    Unclear cause, anticipate med related. Reviewed this. She will try lower gabapentin dose, f/u with psych if no improvement.       Iron deficiency anemia     Await EGD/colonsocopy - may repeat feraheme infusion pending results. Pt does not tolerate oral iron.       Hyponatremia  Chronic. Pt attributes this to high fluid intake - advised to back off her caffeinated beverages (mt dew and sweet tea). Pending Uosm       H/O esophagitis   Bipolar 1 disorder ([place])    Followed by psych Thayer Headings at [place]), on 3 antipsychotics ?related to cloudy sensorium. She will touch base with psych if decreasing gabapentin doesn't help.           Meds ordered this encounter  Medications  . gabapentin (NEURONTIN) 300 MG capsule    Sig: Take 2 capsules (600 mg total) by mouth 2 (two) times daily.    Dispense:  135 capsule    Refill:  3   No orders of the defined types were placed in this encounter.   Follow up plan: No follow-ups on file.  Ria Bush, MD

## 2018-06-20 NOTE — Assessment & Plan Note (Signed)
zofran helping - pending EGD/colonoscopy next week.

## 2018-06-20 NOTE — Patient Instructions (Addendum)
Low sodium may be coming from medicines or from fluid intake - back off sweet tea.  I agree with backing off gabapentin - try this for 1-2 weeks.  We may repeat iron infusion after colonoscopy/endoscopy.  We will await peripheral smear results.  Good to see you today, call us with questions.

## 2018-06-21 ENCOUNTER — Other Ambulatory Visit: Payer: Self-pay | Admitting: Family Medicine

## 2018-06-21 ENCOUNTER — Other Ambulatory Visit: Payer: Self-pay

## 2018-06-21 ENCOUNTER — Telehealth: Payer: Self-pay

## 2018-06-21 ENCOUNTER — Telehealth: Payer: Self-pay | Admitting: Family Medicine

## 2018-06-21 DIAGNOSIS — D473 Essential (hemorrhagic) thrombocythemia: Secondary | ICD-10-CM

## 2018-06-21 DIAGNOSIS — D75839 Thrombocytosis, unspecified: Secondary | ICD-10-CM

## 2018-06-21 NOTE — Telephone Encounter (Signed)
See lab results.  

## 2018-06-21 NOTE — Telephone Encounter (Signed)
Please advise 

## 2018-06-21 NOTE — Telephone Encounter (Signed)
Patient notified as instructed by telephone and verbalized understanding. Appointment scheduled for 06/22/18 with Dr. Danise Mina.

## 2018-06-21 NOTE — Telephone Encounter (Signed)
Copied from [place] [id number]. Topic: Quick Communication - See Telephone Encounter >> Jun 21, 2018 12:23 PM Vernona Rieger wrote: CRM for notification. See Telephone encounter for: 06/21/18.  Patient feels pressure on her bladder along with stinging when she urinates, she said she went to Urgent Care on Sunday and they told her she does not have a UTI. She is taking the AZO pills and not getting any relief. Patient would like the nurse to call her. She said they put her on NITROFURANTOIN 1000mg  even though she did not test positive for UTI. She said she is in a lot of pain.

## 2018-06-21 NOTE — Telephone Encounter (Signed)
Copied from [place] [id number]. Topic: Quick Communication - Lab Results (Clinic Use ONLY) >> Jun 21, 2018  9:14 AM Carolyn Stare wrote:  Pt would like a call back about her lab results

## 2018-06-21 NOTE — Telephone Encounter (Signed)
Please schedule appointment for this. We did not evaluate these symptoms when I saw her yesterday.

## 2018-06-22 ENCOUNTER — Ambulatory Visit: Payer: Self-pay | Admitting: Family Medicine

## 2018-06-22 NOTE — Telephone Encounter (Signed)
Pt confirms she saw results in [place]. [See lab result note, 06/17/18.]

## 2018-06-26 ENCOUNTER — Encounter: Payer: Self-pay | Admitting: Family Medicine

## 2018-06-27 ENCOUNTER — Encounter: Payer: Self-pay | Admitting: Hematology and Oncology

## 2018-06-27 ENCOUNTER — Telehealth: Payer: Self-pay | Admitting: Hematology and Oncology

## 2018-06-27 ENCOUNTER — Telehealth: Payer: Self-pay | Admitting: Gastroenterology

## 2018-06-27 NOTE — Telephone Encounter (Signed)
Spoke with Patient. Patient worried that she will not be able to keep the "Syrup" down tonight. Patient stating she threw up the ducolax. Patient stating she was able to maintain the Miralax. Reviewed patient's med list. Instructed the patient to take her Zofran 30 mins. Prior to evening and morning Suprep. Patient verbalized understanding and agreement with the plan.if patient has any problems tonight, she will call th on call MD

## 2018-06-27 NOTE — Telephone Encounter (Signed)
New referral received from Dr. Danise Mina for thrombocytosis. Pt has been cld and scheduled to see Dr. Audelia Hives on 11/11 at 9am. Pt aware to arrive 30 minutes early. Letter mailed.

## 2018-06-28 ENCOUNTER — Ambulatory Visit (AMBULATORY_SURGERY_CENTER): Payer: Medicare HMO | Admitting: Gastroenterology

## 2018-06-28 ENCOUNTER — Encounter: Payer: Self-pay | Admitting: Family Medicine

## 2018-06-28 ENCOUNTER — Encounter: Payer: Self-pay | Admitting: Gastroenterology

## 2018-06-28 VITALS — BP 114/74 | HR 90 | Temp 98.9°F | Resp 14 | Ht 62.0 in | Wt 124.0 lb

## 2018-06-28 DIAGNOSIS — K2211 Ulcer of esophagus with bleeding: Secondary | ICD-10-CM

## 2018-06-28 DIAGNOSIS — D508 Other iron deficiency anemias: Secondary | ICD-10-CM

## 2018-06-28 DIAGNOSIS — K209 Esophagitis, unspecified: Secondary | ICD-10-CM | POA: Diagnosis not present

## 2018-06-28 DIAGNOSIS — J449 Chronic obstructive pulmonary disease, unspecified: Secondary | ICD-10-CM | POA: Diagnosis not present

## 2018-06-28 DIAGNOSIS — K219 Gastro-esophageal reflux disease without esophagitis: Secondary | ICD-10-CM | POA: Diagnosis not present

## 2018-06-28 DIAGNOSIS — K2210 Ulcer of esophagus without bleeding: Secondary | ICD-10-CM

## 2018-06-28 DIAGNOSIS — K449 Diaphragmatic hernia without obstruction or gangrene: Secondary | ICD-10-CM

## 2018-06-28 DIAGNOSIS — Z1211 Encounter for screening for malignant neoplasm of colon: Secondary | ICD-10-CM | POA: Diagnosis not present

## 2018-06-28 DIAGNOSIS — K5909 Other constipation: Secondary | ICD-10-CM

## 2018-06-28 DIAGNOSIS — D509 Iron deficiency anemia, unspecified: Secondary | ICD-10-CM | POA: Diagnosis not present

## 2018-06-28 DIAGNOSIS — K5900 Constipation, unspecified: Secondary | ICD-10-CM | POA: Diagnosis not present

## 2018-06-28 MED ORDER — PANTOPRAZOLE SODIUM 40 MG PO TBEC: 40.0000 mg | DELAYED_RELEASE_TABLET | Freq: Every day | ORAL | 3 refills | Status: DC

## 2018-06-28 MED ORDER — SUCRALFATE 1 GM/10ML PO SUSP: 1.0000 g | Freq: Four times a day (QID) | ORAL | 1 refills | Status: DC

## 2018-06-28 MED ORDER — PANTOPRAZOLE SODIUM 40 MG PO TBEC: 40.0000 mg | DELAYED_RELEASE_TABLET | Freq: Two times a day (BID) | ORAL | 11 refills | Status: DC

## 2018-06-28 MED ORDER — SODIUM CHLORIDE 0.9 % IV SOLN: 500.0000 mL | Freq: Once | INTRAVENOUS | Status: DC

## 2018-06-28 NOTE — Progress Notes (Signed)
Called to room to assist during endoscopic procedure.  Patient ID and intended procedure confirmed with present staff. Received instructions for my participation in the procedure from the performing physician.  

## 2018-06-28 NOTE — Op Note (Signed)
[place] Patient Name: Robin Welch Procedure Date: 06/28/2018 1:26 PM MRN: [id number] Endoscopist: Mauri Pole , MD Age: 52 Referring MD:  Date of Birth: 1965-11-16 Gender: Female Account #: [id number] Procedure:                Colonoscopy Indications:              Screening for colorectal malignant neoplasm Medicines:                Monitored Anesthesia Care Procedure:                Pre-Anesthesia Assessment:                           - Prior to the procedure, a History and Physical                            was performed, and patient medications and                            allergies were reviewed. The patient's tolerance of                            previous anesthesia was also reviewed. The risks                            and benefits of the procedure and the sedation                            options and risks were discussed with the patient.                            All questions were answered, and informed consent                            was obtained. Prior Anticoagulants: The patient has                            taken no previous anticoagulant or antiplatelet                            agents. ASA Grade Assessment: III - A patient with                            severe systemic disease. After reviewing the risks                            and benefits, the patient was deemed in                            satisfactory condition to undergo the procedure.                           After obtaining informed consent, the colonoscope  was passed under direct vision. Throughout the                            procedure, the patient's blood pressure, pulse, and                            oxygen saturations were monitored continuously. The                            Colonoscope was introduced through the anus with                            the intention of advancing to the splenic flexure.                            The scope  was advanced to the sigmoid colon before                            the procedure was aborted. Medications were given.                            The colonoscopy was performed without difficulty.                            The patient tolerated the procedure well. The                            quality of the bowel preparation was poor. The                            rectum was photographed. Scope In: 1:59:05 PM Scope Out: 2:00:40 PM Total Procedure Duration: 0 hours 1 minute 35 seconds  Findings:                 The perianal and digital rectal examinations were                            normal.                           A large amount of stool was found in the rectum and                            in the sigmoid colon, precluding visualization. Complications:            No immediate complications. Estimated Blood Loss:     Estimated blood loss: none. Impression:               - Preparation of the colon was poor.                           - Stool in the rectum and in the sigmoid colon.                           - No specimens collected.  Recommendation:           - Patient has a contact number available for                            emergencies. The signs and symptoms of potential                            delayed complications were discussed with the                            patient. Return to normal activities tomorrow.                            Written discharge instructions were provided to the                            patient.                           - Resume previous diet.                           - Continue present medications.                           - Repeat colonoscopy at the next available                            appointment because the bowel preparation was                            suboptimal.                           - For future colonoscopy the patient will require                            an extended preparation. If there are any                             questions, please contact the gastroenterologist. Mauri Pole, MD 06/28/2018 2:15:25 PM This report has been signed electronically.

## 2018-06-28 NOTE — Patient Instructions (Signed)
Incomplete colonoscopy Rescheduled egd and colon.     YOU HAD AN ENDOSCOPIC PROCEDURE TODAY AT [facility]:   Refer to the procedure report that was given to you for any specific questions about what was found during the examination.  If the procedure report does not answer your questions, please call your gastroenterologist to clarify.  If you requested that your care partner not be given the details of your procedure findings, then the procedure report has been included in a sealed envelope for you to review at your convenience later.  YOU SHOULD EXPECT: Some feelings of bloating in the abdomen. Passage of more gas than usual.  Walking can help get rid of the air that was put into your GI tract during the procedure and reduce the bloating. If you had a lower endoscopy (such as a colonoscopy or flexible sigmoidoscopy) you may notice spotting of blood in your stool or on the toilet paper. If you underwent a bowel prep for your procedure, you may not have a normal bowel movement for a few days.  Please Note:  You might notice some irritation and congestion in your nose or some drainage.  This is from the oxygen used during your procedure.  There is no need for concern and it should clear up in a day or so.  SYMPTOMS TO REPORT IMMEDIATELY:   Following lower endoscopy (colonoscopy or flexible sigmoidoscopy):  Excessive amounts of blood in the stool  Significant tenderness or worsening of abdominal pains  Swelling of the abdomen that is new, acute  Fever of 100F or higher   Following upper endoscopy (EGD)  Vomiting of blood or coffee ground material  New chest pain or pain under the shoulder blades  Painful or persistently difficult swallowing  New shortness of breath  Fever of 100F or higher  Black, tarry-looking stools  For urgent or emergent issues, a gastroenterologist can be reached at any hour by calling [phone].   DIET:  We do recommend a small meal at  first, but then you may proceed to your regular diet.  Drink plenty of fluids but you should avoid alcoholic beverages for 24 hours.  ACTIVITY:  You should plan to take it easy for the rest of today and you should NOT DRIVE or use heavy machinery until tomorrow (because of the sedation medicines used during the test).    FOLLOW UP: Our staff will call the number listed on your records the next business day following your procedure to check on you and address any questions or concerns that you may have regarding the information given to you following your procedure. If we do not reach you, we will leave a message.  However, if you are feeling well and you are not experiencing any problems, there is no need to return our call.  We will assume that you have returned to your regular daily activities without incident.  If any biopsies were taken you will be contacted by phone or by letter within the next 1-3 weeks.  Please call us at [phone] if you have not heard about the biopsies in 3 weeks.    SIGNATURES/CONFIDENTIALITY: You and/or your care partner have signed paperwork which will be entered into your electronic medical record.  These signatures attest to the fact that that the information above on your After Visit Summary has been reviewed and is understood.  Full responsibility of the confidentiality of this discharge information lies with you and/or your care-partner.

## 2018-06-28 NOTE — Progress Notes (Signed)
Report given to PACU, vss 

## 2018-06-28 NOTE — Op Note (Signed)
[place] Patient Name: Robin Welch Procedure Date: 06/28/2018 1:26 PM MRN: [id number] Endoscopist: Mauri Pole , MD Age: 52 Referring MD:  Date of Birth: 1966-05-24 Gender: Female Account #: [id number] Procedure:                Upper GI endoscopy Indications:              Follow-up of esophageal ulcer and severe erosive                            esophagitis Medicines:                Monitored Anesthesia Care Procedure:                Pre-Anesthesia Assessment:                           - Prior to the procedure, a History and Physical                            was performed, and patient medications and                            allergies were reviewed. The patient's tolerance of                            previous anesthesia was also reviewed. The risks                            and benefits of the procedure and the sedation                            options and risks were discussed with the patient.                            All questions were answered, and informed consent                            was obtained. Prior Anticoagulants: The patient has                            taken no previous anticoagulant or antiplatelet                            agents. ASA Grade Assessment: II - A patient with                            mild systemic disease. After reviewing the risks                            and benefits, the patient was deemed in                            satisfactory condition to undergo the procedure.  After obtaining informed consent, the endoscope was                            passed under direct vision. Throughout the                            procedure, the patient's blood pressure, pulse, and                            oxygen saturations were monitored continuously. The                            Endoscope was introduced through the mouth, and                            advanced to the second part of duodenum.  The upper                            GI endoscopy was accomplished without difficulty.                            The patient tolerated the procedure well. Scope In: Scope Out: Findings:                 Many cratered and linear esophageal ulcers with                            oozing blood were found 23 to 38 cm from the                            incisors. Biopsies were taken with a cold forceps                            for histology.                           The Z-line was irregular and was found 38 cm from                            the incisors.                           A small hiatal hernia was present.                           No gross lesions were noted in the entire examined                            stomach.                           The first portion of the duodenum and second                            portion of the duodenum were normal.  Biopsies for                            histology were taken with a cold forceps for                            evaluation of celiac disease. Complications:            No immediate complications. Estimated Blood Loss:     Estimated blood loss was minimal. Impression:               - Bleeding esophageal ulcers. Biopsied.                           - Z-line irregular, 38 cm from the incisors.                           - Small hiatal hernia.                           - No gross lesions in the stomach.                           - Normal first portion of the duodenum and second                            portion of the duodenum. Biopsied. Recommendation:           - Patient has a contact number available for                            emergencies. The signs and symptoms of potential                            delayed complications were discussed with the                            patient. Return to normal activities tomorrow.                            Written discharge instructions were provided to the                             patient.                           - Resume previous diet.                           - Continue present medications.                           - Await pathology results.                           - Repeat upper endoscopy in 4-6 weeks to check  healing.                           - Protonix 40mg  BID before breakfast and dinner                           - Carafate 1 gm suspension before meals and at                            bedtime                           - Anti reflux measures Mauri Pole, MD 06/28/2018 2:12:54 PM This report has been signed electronically.

## 2018-06-29 ENCOUNTER — Telehealth: Payer: Self-pay | Admitting: *Deleted

## 2018-06-29 ENCOUNTER — Encounter (HOSPITAL_COMMUNITY): Payer: Self-pay | Admitting: Emergency Medicine

## 2018-06-29 ENCOUNTER — Other Ambulatory Visit (HOSPITAL_COMMUNITY): Payer: Self-pay | Admitting: *Deleted

## 2018-06-29 ENCOUNTER — Other Ambulatory Visit: Payer: Self-pay

## 2018-06-29 ENCOUNTER — Other Ambulatory Visit: Payer: Self-pay | Admitting: Gastroenterology

## 2018-06-29 ENCOUNTER — Emergency Department (HOSPITAL_COMMUNITY)
Admission: EM | Admit: 2018-06-29 | Discharge: 2018-06-29 | Disposition: A | Discharge status: Home / Self Care | Payer: Medicare HMO | Attending: Emergency Medicine | Admitting: Emergency Medicine

## 2018-06-29 DIAGNOSIS — F5105 Insomnia due to other mental disorder: Secondary | ICD-10-CM

## 2018-06-29 DIAGNOSIS — F430 Acute stress reaction: Secondary | ICD-10-CM | POA: Insufficient documentation

## 2018-06-29 DIAGNOSIS — R69 Illness, unspecified: Secondary | ICD-10-CM | POA: Diagnosis not present

## 2018-06-29 DIAGNOSIS — J449 Chronic obstructive pulmonary disease, unspecified: Secondary | ICD-10-CM | POA: Insufficient documentation

## 2018-06-29 DIAGNOSIS — F419 Anxiety disorder, unspecified: Secondary | ICD-10-CM | POA: Insufficient documentation

## 2018-06-29 DIAGNOSIS — Z79899 Other long term (current) drug therapy: Secondary | ICD-10-CM | POA: Insufficient documentation

## 2018-06-29 DIAGNOSIS — F411 Generalized anxiety disorder: Secondary | ICD-10-CM

## 2018-06-29 DIAGNOSIS — K2211 Ulcer of esophagus with bleeding: Secondary | ICD-10-CM

## 2018-06-29 DIAGNOSIS — F410 Panic disorder [episodic paroxysmal anxiety] without agoraphobia: Secondary | ICD-10-CM | POA: Diagnosis not present

## 2018-06-29 DIAGNOSIS — F319 Bipolar disorder, unspecified: Secondary | ICD-10-CM

## 2018-06-29 DIAGNOSIS — R5381 Other malaise: Secondary | ICD-10-CM | POA: Diagnosis not present

## 2018-06-29 MED ORDER — BUSPIRONE HCL 15 MG PO TABS: 15.0000 mg | ORAL_TABLET | Freq: Two times a day (BID) | ORAL | 1 refills | Status: DC

## 2018-06-29 MED ORDER — TIOTROPIUM BROMIDE MONOHYDRATE 2.5 MCG/ACT IN AERS: 1.0000 | INHALATION_SPRAY | Freq: Every day | RESPIRATORY_TRACT | 6 refills | Status: DC

## 2018-06-29 MED ORDER — ZIPRASIDONE HCL 80 MG PO CAPS: 80.0000 mg | ORAL_CAPSULE | Freq: Every day | ORAL | 5 refills | Status: DC

## 2018-06-29 MED ORDER — ONDANSETRON 4 MG PO TBDP: 4.0000 mg | ORAL_TABLET | Freq: Three times a day (TID) | ORAL | 2 refills | Status: DC | PRN

## 2018-06-29 MED ORDER — OLANZAPINE 15 MG PO TABS: 15.0000 mg | ORAL_TABLET | Freq: Every day | ORAL | 1 refills | Status: DC

## 2018-06-29 MED ORDER — HYDROXYZINE HCL 25 MG PO TABS: 50.0000 mg | ORAL_TABLET | Freq: Three times a day (TID) | ORAL | 5 refills | Status: DC

## 2018-06-29 MED ORDER — PANTOPRAZOLE SODIUM 40 MG PO TBEC: 40.0000 mg | DELAYED_RELEASE_TABLET | Freq: Two times a day (BID) | ORAL | 11 refills | Status: DC

## 2018-06-29 MED ORDER — ALBUTEROL SULFATE HFA 108 (90 BASE) MCG/ACT IN AERS: 1.0000 | INHALATION_SPRAY | Freq: Four times a day (QID) | RESPIRATORY_TRACT | 3 refills | Status: DC | PRN

## 2018-06-29 MED ORDER — QUETIAPINE FUMARATE 400 MG PO TABS: 800.0000 mg | ORAL_TABLET | Freq: Every day | ORAL | 0 refills | Status: DC

## 2018-06-29 MED ORDER — GABAPENTIN 300 MG PO CAPS: 600.0000 mg | ORAL_CAPSULE | Freq: Two times a day (BID) | ORAL | 3 refills | Status: DC

## 2018-06-29 MED ORDER — LORAZEPAM 1 MG PO TABS
0.5000 mg | ORAL_TABLET | Freq: Once | ORAL | Status: AC
  Administered 2018-06-29: 0.5 mg via ORAL
  Filled 2018-06-29: qty 1

## 2018-06-29 MED ORDER — SUCRALFATE 1 GM/10ML PO SUSP: 1.0000 g | Freq: Four times a day (QID) | ORAL | 1 refills | Status: DC

## 2018-06-29 NOTE — Telephone Encounter (Signed)
Attempted follow up call.  Left message.  Will call again this afternoon.  
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
## 2018-06-30 ENCOUNTER — Ambulatory Visit (HOSPITAL_COMMUNITY)
Admission: RE | Admit: 2018-06-30 | Discharge: 2018-06-30 | Disposition: A | Discharge status: Home / Self Care | Payer: Medicare HMO | Source: Ambulatory Visit | Attending: Family Medicine | Admitting: Family Medicine

## 2018-06-30 ENCOUNTER — Other Ambulatory Visit: Payer: Self-pay | Admitting: Gastroenterology

## 2018-06-30 DIAGNOSIS — D509 Iron deficiency anemia, unspecified: Secondary | ICD-10-CM | POA: Insufficient documentation

## 2018-06-30 MED ORDER — SODIUM CHLORIDE 0.9 % IV SOLN
510.0000 mg | INTRAVENOUS | Status: DC
  Administered 2018-06-30: 510 mg via INTRAVENOUS
  Filled 2018-06-30: qty 17

## 2018-06-30 MED ORDER — SUCRALFATE 1 G PO TABS: 1.0000 g | ORAL_TABLET | Freq: Three times a day (TID) | ORAL | 3 refills | Status: DC

## 2018-06-30 NOTE — Telephone Encounter (Signed)
Spoke to patient and informed her it was ok to take Carafate tablets she can just crush them to make into a slurry with water. Patient states her daughter is a Software engineer and instructed her to do that. She verbalized understanding. Rx sent to pharmacy.

## 2018-07-01 ENCOUNTER — Ambulatory Visit: Payer: Self-pay | Admitting: Family Medicine

## 2018-07-01 HISTORY — PX: COLONOSCOPY: SHX174

## 2018-07-01 HISTORY — PX: ESOPHAGOGASTRODUODENOSCOPY: SHX1529

## 2018-07-05 DIAGNOSIS — N341 Nonspecific urethritis: Secondary | ICD-10-CM | POA: Diagnosis not present

## 2018-07-05 NOTE — Telephone Encounter (Signed)
Plz have her come in for recheck labs (calcium level). Ordered.

## 2018-07-05 NOTE — Telephone Encounter (Signed)
Spoke to pt and scheduled Ca lab

## 2018-07-05 NOTE — Telephone Encounter (Signed)
Lm on pts vm requesting a call back 

## 2018-07-05 NOTE — Telephone Encounter (Signed)
Verification of benefits have been processed and an approval has been received for pts prolia injection. Pts estimated cost are appx $255. This is only an estimate and cannot be confirmed until benefits are paid. Please advise pt and schedule if needed. If scheduled, once the injection is received, pls contact me back with the date it was received so that I am able to update prolia folder. thanks

## 2018-07-05 NOTE — Addendum Note (Signed)
Addended by: Ria Bush on: 07/05/2018 09:05 AM   Modules accepted: Orders

## 2018-07-05 NOTE — Telephone Encounter (Signed)
Spoke to pt and advised of out of pocket expense and pt is agreeable to charge. Her last Ca lab was completed on 10/18 and low. Is there a time frame that she has to meet before having it repeated? She must have normal labs before we are able to schedule injection. pls advise

## 2018-07-06 ENCOUNTER — Telehealth: Payer: Self-pay | Admitting: Psychiatry

## 2018-07-06 DIAGNOSIS — F411 Generalized anxiety disorder: Secondary | ICD-10-CM

## 2018-07-06 DIAGNOSIS — F319 Bipolar disorder, unspecified: Secondary | ICD-10-CM

## 2018-07-06 MED ORDER — QUETIAPINE FUMARATE 400 MG PO TABS: 800.0000 mg | ORAL_TABLET | Freq: Every day | ORAL | 0 refills | Status: DC

## 2018-07-06 MED ORDER — BUSPIRONE HCL 15 MG PO TABS: 15.0000 mg | ORAL_TABLET | Freq: Two times a day (BID) | ORAL | 0 refills | Status: DC
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
## 2018-07-07 ENCOUNTER — Other Ambulatory Visit: Payer: Self-pay | Admitting: Gastroenterology

## 2018-07-07 ENCOUNTER — Ambulatory Visit (HOSPITAL_COMMUNITY)
Admission: RE | Admit: 2018-07-07 | Discharge: 2018-07-07 | Disposition: A | Discharge status: Home / Self Care | Payer: Medicare HMO | Source: Ambulatory Visit | Attending: Family Medicine | Admitting: Family Medicine

## 2018-07-07 DIAGNOSIS — D509 Iron deficiency anemia, unspecified: Secondary | ICD-10-CM | POA: Diagnosis not present

## 2018-07-07 MED ORDER — SODIUM CHLORIDE 0.9 % IV SOLN
510.0000 mg | INTRAVENOUS | Status: AC
  Administered 2018-07-07: 510 mg via INTRAVENOUS
  Filled 2018-07-07: qty 17

## 2018-07-11 ENCOUNTER — Encounter: Payer: Self-pay | Admitting: Hematology and Oncology

## 2018-07-11 ENCOUNTER — Telehealth: Payer: Self-pay | Admitting: Hematology and Oncology

## 2018-07-11 ENCOUNTER — Encounter: Payer: Self-pay | Admitting: Gastroenterology

## 2018-07-11 ENCOUNTER — Inpatient Hospital Stay: Payer: Medicare HMO | Attending: Hematology and Oncology | Admitting: Hematology and Oncology

## 2018-07-11 ENCOUNTER — Inpatient Hospital Stay: Payer: Medicare HMO

## 2018-07-11 VITALS — BP 128/79 | HR 112 | Temp 97.7°F | Resp 22 | Ht 62.0 in | Wt 130.3 lb

## 2018-07-11 DIAGNOSIS — D509 Iron deficiency anemia, unspecified: Secondary | ICD-10-CM

## 2018-07-11 DIAGNOSIS — D539 Nutritional anemia, unspecified: Secondary | ICD-10-CM

## 2018-07-11 DIAGNOSIS — E871 Hypo-osmolality and hyponatremia: Secondary | ICD-10-CM | POA: Insufficient documentation

## 2018-07-11 DIAGNOSIS — D473 Essential (hemorrhagic) thrombocythemia: Secondary | ICD-10-CM

## 2018-07-11 DIAGNOSIS — K5900 Constipation, unspecified: Secondary | ICD-10-CM | POA: Diagnosis not present

## 2018-07-11 LAB — RETIC PANEL
Immature Retic Fract: 30 % — ABNORMAL HIGH (ref 2.3–15.9)
RBC.: 4.12 MIL/uL (ref 3.87–5.11)
RETICULOCYTE HEMOGLOBIN: 33.6 pg (ref >27.9)
Retic Count, Absolute: 149.6 10*3/uL (ref 19.0–186.0)
Retic Ct Pct: 3.6 % — ABNORMAL HIGH (ref 0.4–3.1)

## 2018-07-11 LAB — CBC WITH DIFFERENTIAL (CANCER CENTER ONLY)
Abs Immature Granulocytes: 0.03 10*3/uL (ref 0.00–0.07)
BASOS ABS: 0.1 10*3/uL (ref 0.0–0.1)
Basophils Relative: 1 %
EOS ABS: 0.4 10*3/uL (ref 0.0–0.5)
EOS PCT: 5 %
HEMATOCRIT: 34.5 % — AB (ref 36.0–46.0)
Hemoglobin: 10.8 g/dL — ABNORMAL LOW (ref 12.0–15.0)
Immature Granulocytes: 0 %
LYMPHS ABS: 1.6 10*3/uL (ref 0.7–4.0)
Lymphocytes Relative: 19 %
MCH: 26.2 pg (ref 26.0–34.0)
MCHC: 31.3 g/dL (ref 30.0–36.0)
MCV: 83.7 fL (ref 80.0–100.0)
MONO ABS: 0.6 10*3/uL (ref 0.1–1.0)
MONOS PCT: 7 %
NRBC: 0.2 % (ref 0.0–0.2)
Neutro Abs: 5.4 10*3/uL (ref 1.7–7.7)
Neutrophils Relative %: 68 %
PLATELETS: 547 10*3/uL — AB (ref 150–400)
RBC: 4.12 MIL/uL (ref 3.87–5.11)
RDW: 19 % — AB (ref 11.5–15.5)
WBC Count: 8 10*3/uL (ref 4.0–10.5)

## 2018-07-11 LAB — COMPREHENSIVE METABOLIC PANEL
ALBUMIN: 3.3 g/dL — AB (ref 3.5–5.0)
ALT: 13 U/L (ref 0–44)
ANION GAP: 10 (ref 5–15)
AST: 15 U/L (ref 15–41)
Alkaline Phosphatase: 147 U/L — ABNORMAL HIGH (ref 38–126)
BILIRUBIN TOTAL: 0.4 mg/dL (ref 0.3–1.2)
BUN: <4 mg/dL — ABNORMAL LOW (ref 6–20)
CO2: 27 mmol/L (ref 22–32)
Calcium: 8.6 mg/dL — ABNORMAL LOW (ref 8.9–10.3)
Chloride: 94 mmol/L — ABNORMAL LOW (ref 98–111)
Creatinine, Ser: 0.68 mg/dL (ref 0.44–1.00)
GFR calc Af Amer: >60 mL/min (ref >60)
GFR calc non Af Amer: >60 mL/min (ref >60)
GLUCOSE: 87 mg/dL (ref 70–99)
POTASSIUM: 3.8 mmol/L (ref 3.5–5.1)
Sodium: 131 mmol/L — ABNORMAL LOW (ref 135–145)
TOTAL PROTEIN: 6.8 g/dL (ref 6.5–8.1)

## 2018-07-11 LAB — IRON AND TIBC
IRON: 132 ug/dL (ref 41–142)
Saturation Ratios: 42 % (ref 21–57)
TIBC: 316 ug/dL (ref 236–444)
UIBC: 184 ug/dL (ref 120–384)

## 2018-07-11 LAB — VITAMIN B12: VITAMIN B 12: 265 pg/mL (ref 180–914)

## 2018-07-11 LAB — FOLATE: FOLATE: 6 ng/mL (ref >5.9)

## 2018-07-11 LAB — LACTATE DEHYDROGENASE: LDH: 177 U/L (ref 98–192)

## 2018-07-11 LAB — FERRITIN: Ferritin: 814 ng/mL — ABNORMAL HIGH (ref 11–307)

## 2018-07-11 NOTE — Progress Notes (Signed)
Outpatient Hematology/Oncology Initial Consultation  Patient Name:  Robin Welch  DOB: [birth year]   Date of Service: July 11, 2018  Referring Provider: Ria Bush, MD [address] [place], [place] [address]   Consulting Physician: Henreitta Leber, MD Hematology/Oncology  Reason for Referral: In the setting of anemia and thrombocytosis, she presents now for further diagnostic and therapeutic recommendations.  History Present Illness: Robin Welch is a 52 year old resident of [place] whose past medical history is significant for anxiety disorder; bipolar disorder; chronic obstructive pulmonary disease; osteoporosis; peptic ulcer disease; gastroesophageal reflux disease; chronic dysphasia; active tobacco dependence; and former substance abuse.  Her primary care physician is Dr. Ria Bush.  She is alone at this first visit.  Robin Welch has been aware of her anemia for at least the past 2 years. She has been treated with parenteral iron infusion at least 3 times over the past 2 years.  Her last iron infusion was last week.  She is not tolerant of oral iron.  It causes her to be nauseous and vomit.  She underwent in the past several weeks upper endoscopic evaluation which revealed esophageal ulceration and esophagitis. Although scheduled, she has never had a screening colonoscopy.  With medication, her symptoms have improved significantly.  She continues to smoke 1-1/2 packs of cigarettes daily.  She stopped recreational drug use 14 years ago.  In the past she drank heavily.  For the past 5 years she has not consumed any alcoholic beverages.  She has no diabetes mellitus, primary hypertension, or coronary artery disease.  She has no cardiac dysrhythmia.  She reports no seizure disorder or stroke syndrome.  She has no thyroid disease.  In the setting of esophageal ulceration and esophagitis, she has experienced chronic dysphagia to solids.  She was told this was a consequence  of her esophagitis. She denies viral hepatitis, inflammatory bowel disease, or symptomatic diverticulosis.  She reports no known liver or renal disease.  She has no rheumatoid or gouty arthritis.  She has no peripheral arterial venous thromboembolic disease.  Over the past 12 months, she is gained 15 pounds. Her appetite is stable. She has no new visual changes or hearing deficit.  She reports her chronic hacking cough with Mapes productive phlegm.  There is no sore throat or orthopnea.  She denies dyspnea at rest.  She has chronic but stable exertional dyspnea.  There is no pain on swallowing.  No fever, shaking chills, sweats, or flulike symptoms are evident.  Her heartburn and dyspepsia have improved with medication. There is no nausea, vomiting, or diarrhea.  She has straining at stool and a tendency toward constipation.  She moves her bowels once weekly.  MiraLAX appears to be effective. There is no melena or bright red blood per rectum.  2 days ago she completed a course of antibiotics for urinary tract infection. She reports no urinary frequency, urgency, hematuria, dysuria.  There are no arthralgias or myalgias. She denies any numbness or tingling in the fingers or toes.  It is with this background she presents now for further diagnostic and therapeutic recommendations in the setting of anemia as outlined above.  Past Medical History:  Diagnosis Date  . Anxiety   . Bipolar 1 disorder [facility])    sees Dr. Candis Schatz psych [phone])  . COPD (chronic obstructive pulmonary disease) ([place]) 03/2013   hyperinflation by CXR  . History of cocaine dependence ([place]) latest 2017   undergoing detox  . History of stomach ulcers   .  Hx: UTI (urinary tract infection)   . Migraine    "stopped in ~ 2008" (03/11/2017)  . Osteoporosis 06/09/2018   DEXA 05/2017 - T score -2.5 L hip, -1.3 spine  . Positive PPD 1989   s/p CXR and INH 6 mo  . Seizure ([place]) X 1   "from takiing too many headache medicine?" (03/11/2017)   . Smoker    Past Surgical History:  Procedure Laterality Date  . DILATION AND CURETTAGE OF UTERUS  X 1   S/P miscarriage  . ESOPHAGOGASTRODUODENOSCOPY  03/2017   candidal esophagitis, treated, LA grade D esophagitis, 4cm HH (Nandigam)  . LAPAROSCOPIC CHOLECYSTECTOMY  2006  . TUBAL LIGATION  2003   Gynecologic History: Her menarche was at age 14 years. Her last menstruation was at age 53 years. She is a gravida 4 para 1 miscarriage 1 Her first pregnancy was at age 67 years. Her last pregnancy was at age 54 years. She reports no hormone replacement therapy. In her 35s she was on oral contraception for 10 years. Her last screening mammogram was 3 years ago. She has never had a breast biopsy. Her last Pap smear was many years ago.  Family History  Problem Relation Age of Onset  . Diabetes Mother        T2DM  . Breast cancer Mother 53  . Dementia Mother   . Lymphoma Mother   . Coronary artery disease Father 30       MI  . Anxiety disorder Paternal Aunt   . Anxiety disorder Cousin   . Depression Cousin   . ADD / ADHD Child   . Anxiety disorder Child   . Depression Child   . Stroke Neg Hx   . Colon cancer Neg Hx   . Cancer - Colon Neg Hx   Mother: Deceased: Age 13 years: MI/NHL Father: Deceased: Age 11 years: MI/TAA Brothers (2): No medical problems Sisters (1): No medical problems  Social History   Socioeconomic History  . Marital status: Divorced    Spouse name: Not on file  . Number of children: 2  . Years of education: bachelors  . Highest education level: Not on file  Occupational History  . 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 Financial resource strain: Not on file  . Food insecurity:    Worry: Not on file    Inability: Not on file  . Transportation needs:    Medical: Not on file    Non-medical: Not on file  Tobacco Use  . Smoking status: Current Every Day  Smoker    Packs/day: 1.00    Years: 36.00    Pack years: 36.00    Types: Cigarettes  . Smokeless tobacco: Never Used  Substance and Sexual Activity  . Alcohol use: Not Currently    Alcohol/week: 0.0 standard drinks    Comment: 03/11/2017 "stopped 09/18/2015; never an alcoholic"  . Drug use: Not Currently    Types: Cocaine    Comment: 03/11/2017 "stopped 09/18/2015"  . Sexual activity: Never    Birth control/protection: Surgical  Lifestyle  . Physical activity:    Days per week: Not on file    Minutes per session: Not on file  . Stress: Not on file  Relationships  . Social connections:    Talks on phone: Not on file    Gets together: Not on file    Attends religious service:  Not on file    Active member of club or organization: Not on file    Attends meetings of clubs or organizations: Not on file    Relationship status: Not on file  . Intimate partner violence:    Fear of current or ex partner: Not on file    Emotionally abused: Not on file    Physically abused: Not on file    Forced sexual activity: Not on file  Other Topics Concern  . Not on file  Social History Narrative   Caffeine: 40 oz diet mountain dew/day   Lives with husband and 2 children, no pets     Robin Welch is divorced. She works as a Oceanographer. She began smoking the age of 30 years. She smoked a maximum 2 packs of cigarettes daily. She currently smokes 1/2 packs of cigarettes daily. Her alcohol intake in the past was heavy. She stopped drinking 5 years ago. She currently does not consume alcoholic beverages. She stopped all substance abuse 3 years ago. She used inappropriately alprazolam, Percocet, Adderall, and cocaine.  Transfusion History: No prior transfusion  Exposure History: She has no known exposure to toxic chemicals, radiation, or pesticides.  Allergies  Allergen Reactions  . Prozac [Fluoxetine Hcl] Swelling and Rash  . Doxycycline Nausea And Vomiting  . Celexa [Citalopram  Hydrobromide] Other (See Comments)    Possible allergic reaction  . Chantix [Varenicline Tartrate] Swelling    Mouth swelling  . Hydroxyzine     Not effective  . Klonopin [Clonazepam]     Caused instablility. Not able to drive due to over medicated  . Lunesta [Eszopiclone]     Not effective  . Sonata [Zaleplon]     Not effective  . Trazodone And Nefazodone     ineffective  . Zolpimist [Zolpidem Tartrate]     Not effective  . Azithromycin     Reaction not recalled by the patient  She has no food or seasonal allergies  Current Outpatient Medications on File Prior to Visit  Medication Sig  . albuterol (VENTOLIN HFA) 108 (90 Base) MCG/ACT inhaler Inhale 1-2 puffs into the lungs every 6 (six) hours as needed for wheezing or shortness of breath.  . busPIRone (BUSPAR) 15 MG tablet Take 1 tablet (15 mg total) by mouth 2 (two) times daily.  . Calcium 500 MG CHEW Chew 1 tablet (500 mg total) by mouth 2 (two) times daily.  Marland Kitchen CARAFATE 1 GM/10ML suspension Take 10 mLs (1 g total) by mouth 4 (four) times daily.  . Cholecalciferol (VITAMIN D) 2000 units CAPS Take 1 capsule (2,000 Units total) by mouth daily.  Marland Kitchen gabapentin (NEURONTIN) 300 MG capsule Take 2 capsules (600 mg total) by mouth 2 (two) times daily.  . hydrOXYzine (ATARAX/VISTARIL) 25 MG tablet Take 2 tablets (50 mg total) by mouth 3 (three) times daily.  Marland Kitchen OLANZapine (ZYPREXA) 15 MG tablet Take 1 tablet (15 mg total) by mouth at bedtime.  . ondansetron (ZOFRAN-ODT) 4 MG disintegrating tablet Take 1 tablet (4 mg total) by mouth every 8 (eight) hours as needed for nausea or vomiting.  . pantoprazole (PROTONIX) 40 MG tablet Take 1 tablet (40 mg total) by mouth 2 (two) times daily before a meal.  . QUEtiapine (SEROQUEL) 400 MG tablet Take 2 tablets (800 mg total) by mouth at bedtime.  . SF 5000 PLUS 1.1 % CREA dental cream Place 1 application onto teeth at bedtime.   . Tiotropium Bromide Monohydrate (SPIRIVA RESPIMAT) 2.5 MCG/ACT AERS Inhale  1 puff into the lungs daily.  . ziprasidone (GEODON) 80 MG capsule Take 1 capsule (80 mg total) by mouth daily.  Marland Kitchen acetaminophen (TYLENOL) 500 MG tablet Take 1,000 mg by mouth every 6 (six) hours as needed for mild pain or moderate pain.   No current facility-administered medications on file prior to visit.     Review of Systems: Constitutional: No fever, sweats, or shaking chills.  No appetite or weight deficit.  Energy level stable. Skin: No rash, scaling, sores, lumps, or jaundice. HEENT: No visual changes or hearing deficit. Pulmonary: Chronic hacking morning cough with Granzow productive phlegm; no sore throat, or orthopnea; COPD; raspy voice. Breasts: No complaints. Cardiovascular: No coronary artery disease, angina, or myocardial infarction.  No cardiac dysrhythmia, essential hypertension, or dyslipidemia. Gastrointestinal: No indigestion, abdominal pain, diarrhea, or constipation.  Chronic dysphagia to solids.  No change in bowel habits.  GERD; PUD; status post EGD several weeks ago. Genitourinary: No urinary frequency, urgency, hematuria, or dysuria. Musculoskeletal: No arthralgias or myalgias; no joint swelling, pain, or instability; osteoporosis. Hematologic: No bleeding tendency or easy bruisability. Endocrine: No intolerance to hot or cold; no thyroid disease or diabetes mellitus. Vascular: No peripheral arterial or venous thromboembolic disease. Psychological: Chronic anxiety disorder; bipolar disorder; no mood changes; former alcohol and substance abuse; active tobacco dependence. Neurological: No dizziness, lightheadedness, syncope, or near syncopal episodes; no numbness or tingling in the fingers or toes.  Physical Examination: Vital Signs: Body surface area is 1.61 meters squared.  Vitals:   07/11/18 0856  BP: 128/79  Pulse: (!) 112  Resp: (!) 22  Temp: 97.7 F (36.5 C)  SpO2: 100%    Filed Weights   07/11/18 0856  Weight: 130 lb 4.8 oz (59.1 kg)  ECOG PERFORMANCE  STATUS: 0 Constitutional:  Robin Welch is fully nourished and developed. She looks age appropriate. She is friendly and cooperative without respiratory compromise at rest. Skin: No rashes, scaling, dryness, jaundice, or itching. HEENT: Head is normocephalic and atraumatic.  Pupils are equal round and reactive to light and accommodation.  Sclerae are anicteric.  Conjunctivae are pink.  Her voice is coarse and raspy.  No sinus tenderness nor oropharyngeal lesions.  Lips without cracking or peeling; tongue without mass, inflammation, or nodularity.  Mucous membranes are moist. Neck: Supple and symmetric.  No jugular venous distention or thyromegaly.  Trachea is midline. Lymphatics: No cervical or supraclavicular lymphadenopathy.  No epitrochlear, axillary, or inguinal lymphadenopathy is appreciated. Respiratory/chest: Thorax is symmetrical.  Breath sounds are clear to auscultation and percussion.  Normal excursion and respiratory effort. Back: Symmetric without deformity or tenderness. Cardiovascular: Heart rate and rhythm are regular without murmurs. Gastrointestinal: Abdomen is soft, nontender; no organomegaly.  Bowel sounds are normoactive.  No masses are appreciated. Extremities: In the lower extremities, there is no asymmetric swelling, erythema, tenderness, or cord formation.  No clubbing, cyanosis, nor edema. Hematologic: No petechiae, hematomas, or ecchymoses. Psychological:  She is oriented to person, place, and time; normal affect. Neurological: There are no gross neurologic deficits.  Laboratory Results: I have reviewed the data as listed: July 11, 2018  Ref Range & Units 10:50 3wk ago 4wk ago 21moago 364mogo  WBC Count 4.0 - 10.5 K/uL 8.0  8.9 R 6.0  5.9  9.5   RBC 3.87 - 5.11 MIL/uL 4.12  4.03 R 4.03 R 3.99  3.54Low  R  Hemoglobin 12.0 - 15.0 g/dL 10.8Low   10.1Low  R 10.2Low   9.8Low   7.9 Repeated  and verified X2.Low Panic    HCT 36.0 - 46.0 % 34.5Low   31.7Low  R 31.9Low    30.9Low   24.4Low    MCV 80.0 - 100.0 fL 83.7  78.7Low   79.2 R 77.4Low  R 69.0Low  R  MCH 26.0 - 34.0 pg 26.2  25.1Low  R  24.6Low     MCHC 30.0 - 36.0 g/dL 31.3  31.9Low  R 31.9  31.7  32.2   RDW 11.5 - 15.5 % 19.0High   19.4High  R 23.5High   24.3High   17.3High    Platelet Count 150 - 400 K/uL 547High   621High  R 696.0High  R 622High  CM 530.0High  R  nRBC 0.0 - 0.2 % 0.2       Neutrophils Relative % % 68   53.9 R  70.5 R  Neutro Abs 1.7 - 7.7 K/uL 5.4   3.3 R  6.7 R  Lymphocytes Relative % 19   29.2 R  16.5 R  Lymphs Abs 0.7 - 4.0 K/uL 1.6   1.8   1.6   Monocytes Relative % 7   13.8High  R  11.9 R  Monocytes Absolute 0.1 - 1.0 K/uL 0.6   0.8   1.1High    Eosinophils Relative % 5   2.2 R  0.7 R  Eosinophils Absolute 0.0 - 0.5 K/uL 0.4   0.1 R  0.1 R  Basophils Relative % 1   0.9 R  0.4 R  Basophils Absolute 0.0 - 0.1 K/uL 0.1   0.1   0.0   Immature Granulocytes % 0       Abs Immature Granulocytes 0.00 - 0.07 K/uL 0.03             Ref Range & Units 10:50 3wk ago 4wk ago 36moago 336mogo  Sodium 135 - 145 mmol/L 131Low   129Low  R 128Low  R 132Low   124Low  R  Potassium 3.5 - 5.1 mmol/L 3.8  3.5 R 3.5 R 2.9Low   3.5 R  Chloride 98 - 111 mmol/L 94Low   96 R 94Low  R 95Low   86Low  R  CO2 22 - 32 mmol/L 27  24 R 29 R 24  30 R  Glucose, Bld 70 - 99 mg/dL 87  136High   93  139High   90   BUN 6 - 20 mg/dL <4Low   4Low  R 2Low  R <5Low   5Low  R  Creatinine, Ser 0.44 - 1.00 mg/dL 0.68  0.70 R 0.58 R 0.69  0.72 R  Calcium 8.9 - 10.3 mg/dL 8.6Low   8.1Low  R 8.1Low  R 8.3Low   8.5 R  Total Protein 6.5 - 8.1 g/dL 6.8     6.4 R  Albumin 3.5 - 5.0 g/dL 3.3Low      3.6 R  AST 15 - 41 U/L 15     13 R  ALT 0 - 44 U/L 13     10 R  Alkaline Phosphatase 38 - 126 U/L 147High      78 R  Total Bilirubin 0.3 - 1.2 mg/dL 0.4     0.3 R  GFR calc non Af Amer >60 mL/min >60    >60    GFR calc Af Amer >60 mL/min >60    >60 CM     Ref Range & Units 10:50  Retic Ct Pct 0.4 - 3.1 % 3.6High  RBC.  3.87 - 5.11 MIL/uL 4.12   Retic Count, Absolute 19.0 - 186.0 K/uL 149.6   Immature Retic Fract 2.3 - 15.9 % 30.0High    Reticulocyte Hemoglobin >27.9 pg 33.6   Comment:     Given the high negative predictive  value of a RET-He result > 32 pg  iron deficiency is essentially  excluded. If this patient is anemic  other etiologies should be  considered.   LDH 177 Vitamin D32 671 Folic acid 6 Iron/TIBC 132/316 Iron saturation 42% Ferritin 814  Diagnostic/Imaging Studies: May 05, 2018 CHEST - 2 VIEW  COMPARISON:  04/20/2017  FINDINGS: The cardiomediastinal silhouette is within normal limits. Lung volumes are lower than on the prior study. No airspace consolidation, edema, pleural effusion, or pneumothorax is identified. No acute osseous abnormality is seen.  IMPRESSION: No active cardiopulmonary disease.  Robin Welch M.D. 05/05/2018 08:51  Summary/Assessment: In the setting of anemia and thrombocytosis, she presents now for further diagnostic and therapeutic recommendations.  Kavita has been aware of her anemia for at least the past 2 years. She has been treated with parenteral iron infusion at least 3 times over the past 2 years.  Her last iron infusion was last week.  She is not tolerant of oral iron.  It causes her to be nauseous and vomit.  She underwent in the past several weeks upper endoscopic evaluation which revealed esophageal ulceration and esophagitis. Although scheduled, she has never had a screening colonoscopy.  With medication, her symptoms have improved significantly.  She continues to smoke 1-1/2 packs of cigarettes daily.  She stopped recreational drug use 14 years ago.  In the past she drank heavily.  For the past 5 years she has not consumed any alcoholic beverages.  She has no diabetes mellitus, primary hypertension, or coronary artery disease.  She has no cardiac dysrhythmia.  She reports no seizure disorder or stroke syndrome.  She has no  thyroid disease.  In the setting of esophageal ulceration and esophagitis, she has experienced chronic dysphagia to solids.  She was told this was a consequence of her esophagitis. She denies viral hepatitis, inflammatory bowel disease, or symptomatic diverticulosis.  She reports no liver or renal disease.  She has no rheumatoid or gouty arthritis.  She has no peripheral arterial venous thromboembolic disease.  Over the past 12 months, she is gained 15 pounds. Her appetite is stable. She has no new visual changes or hearing deficit.  She reports her chronic hacking cough with Wartman productive phlegm.  There is no sore throat or orthopnea.  She denies dyspnea at rest.  She has chronic but stable exertional dyspnea.  There is no pain on swallowing.  No fever, shaking chills, sweats, or flulike symptoms are evident. Her heartburn and dyspepsia have improved with medication. There is no nausea, vomiting, or diarrhea. She has straining at stool and a tendency toward constipation.  She moves her bowels once weekly.  MiraLAX appears to be effective.  There is no melena or bright red blood per rectum.  2 days ago she completed a course of antibiotics for urinary tract infection.  She reports no urinary frequency, urgency, hematuria, dysuria.  There are no arthralgias or myalgias.  She denies any numbness or tingling in the fingers or toes.    Recommendation/Plan: The results of her laboratory studies from today were not available at the time of discharge.  Some of those results are outlined above.  Because she was recently given parenteral iron infusion,  her iron studies are difficult to interpret.  Certainly there is no convincing evidence of iron deficiency at present.  Both her anemia and thrombocytosis persist.  Although barely within the low normal range, folic acid and vitamin B12 levels are normal. Those levels, however, frequently reflect dietary intake and not the storage or utilization of those important  vitamins.  For that reason, the metabolite of vitamin L79 and folic acid, methylmalonic acid and serum homocysteine will be requested.  If agreeable, she will be notified of these results and asked to come in for additional lab work.  Her lab work over the past 3 months, suggests chronic hyponatremia and hypochloremia.  Her BUN is markedly low.  This generally represents some form of nutritional deficit. Her low serum albumin also suggests undernutrition. It is unclear how long her alkaline phosphatase has been widely elevated.  This generally implies some form of infiltrative liver disease. Given her history, an abdominal ultrasound and/or CT imaging may be useful given her history and young age.  We will discuss this further at her next visit.  Other important studies to rule out an underlying myeloproliferative process or active hemolysis are pending.  Because of her chronic constipation, it was recommended that she take MiraLAX: 17 g in 8 ounces of water 1-2 times daily to maintain a daily bowel movement.  In the setting of iron deficiency, she needs a diagnostic colonoscopy.  [place]ore was advised to titrate downward and eventually discontinue cigarette smoking for multiple health associated reasons. Through her primary care office, both pharmacologic and/or behavioral intervention should be considered.  Unrelated to her anemia and thrombocytosis, but because of her smoking history, she is a candidate for primary lung cancer screening.  Barring any unforeseen complications, her next scheduled doctor visit to discuss those results he is on August 01, 2018.  Lena was advised to call us in the interim should any new or untoward problems arise.  The total time spent discussing the previous laboratory studies, role and rationale for evaluating anemia of unclear etiology, systematic methodology for testing, physical examination, and review of outside records with recommendations was 60 minutes.  At least  50% of that time was spent in face-to-face discussion, counseling, and answering questions. There was ample time allotted to answer all questions.  This note was dictated using voice activated technology/software.  Unfortunately, typographical errors are not uncommon, and transcription is subject to mistakes and regrettably misinterpretation.  If necessary, clarification of the above information can be discussed with me at any time.  Thank you Dr. Danise Mina for allowing my participation in the care of Torreon. I will keep you closely informed as the results of her preliminary laboratory data become complete.  Please do not hesitate to call should any questions arise regarding this initial consultation and discussion.  FOLLOW UP: AS DIRECTED   cc:         Ria Bush, MD         Henreitta Leber, MD  Hematology/Oncology [facility] [address] [place], [place] [address] Office: [phone] YJEH: [phone]

## 2018-07-11 NOTE — Patient Instructions (Signed)
We discussed the laboratory studies done previously which suggest both anemia and high platelets.  Laboratory studies will be done today to help identify the cause of your high platelets and low hemoglobin.  Although iron deficiency has been identified previously, additional studies will be done to exclude an underlying nutritional deficit, premature destruction of cells, or underlying abnormal production of platelets.  Barring any unforeseen complications, your next scheduled doctor visit is on August 01, 2018.  Please do not hesitate to call should any new or untoward problems arise in the interim.

## 2018-07-11 NOTE — Telephone Encounter (Signed)
Scheduled appt per 11/11 los - gave patient AVS and calender per los.   

## 2018-07-12 LAB — HAPTOGLOBIN: Haptoglobin: 238 mg/dL — ABNORMAL HIGH (ref 34–200)

## 2018-07-12 LAB — ERYTHROPOIETIN: Erythropoietin: 16.9 m[IU]/mL (ref 2.6–18.5)

## 2018-07-13 ENCOUNTER — Telehealth: Payer: Self-pay | Admitting: *Deleted

## 2018-07-13 ENCOUNTER — Other Ambulatory Visit (INDEPENDENT_AMBULATORY_CARE_PROVIDER_SITE_OTHER): Payer: Medicare HMO

## 2018-07-13 DIAGNOSIS — E8809 Other disorders of plasma-protein metabolism, not elsewhere classified: Secondary | ICD-10-CM | POA: Diagnosis not present

## 2018-07-13 LAB — COMPREHENSIVE METABOLIC PANEL
ALK PHOS: 122 U/L — AB (ref 39–117)
ALT: 11 U/L (ref 0–35)
AST: 12 U/L (ref 0–37)
Albumin: 3.7 g/dL (ref 3.5–5.2)
BILIRUBIN TOTAL: 0.3 mg/dL (ref 0.2–1.2)
BUN: 4 mg/dL — ABNORMAL LOW (ref 6–23)
CO2: 28 meq/L (ref 19–32)
CREATININE: 0.68 mg/dL (ref 0.40–1.20)
Calcium: 8.4 mg/dL (ref 8.4–10.5)
Chloride: 95 mEq/L — ABNORMAL LOW (ref 96–112)
GFR: 95.53 mL/min (ref >60.00)
GLUCOSE: 153 mg/dL — AB (ref 70–99)
Potassium: 3.8 mEq/L (ref 3.5–5.1)
Sodium: 130 mEq/L — ABNORMAL LOW (ref 135–145)
TOTAL PROTEIN: 6.6 g/dL (ref 6.0–8.3)
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
## 2018-07-13 NOTE — Telephone Encounter (Signed)
Dr Silverio Decamp,  This pt is scheduled for an ECL 12-3 with you.  She had an ECL 06-28-2018 with you and the pt did a 2 day suprep miralax prep and she had a poor prep.  Its in the system for her to have a suprep/ miralax prep again.  Is there something else she needs since she was not clean the last time with the standard 2 day Suprep prep.  I read she is to be taking miralax 1 capful daily as well.  Maybe Golytely and Miralax- I just want to be sure she is clean this time.  Please advise, Thanks  [name]

## 2018-07-13 NOTE — Telephone Encounter (Signed)
Ok to do Miralax and [employer]. Please advise patient to take Miralax 1 capful daily and start taking 1 capful BID for 5 days prior to the procedure. Thanks

## 2018-07-14 ENCOUNTER — Other Ambulatory Visit: Payer: Self-pay | Admitting: Family Medicine

## 2018-07-15 ENCOUNTER — Telehealth: Payer: Self-pay

## 2018-07-15 DIAGNOSIS — E538 Deficiency of other specified B group vitamins: Secondary | ICD-10-CM

## 2018-07-15 NOTE — Telephone Encounter (Signed)
Pt is asking about the high numbers in her labs she had done at the cancer center last week. Her oncologist has not told her what they are. Hoped Dr Darnell Level. Could explain.

## 2018-07-19 DIAGNOSIS — E538 Deficiency of other specified B group vitamins: Secondary | ICD-10-CM | POA: Insufficient documentation

## 2018-07-19 NOTE — Telephone Encounter (Signed)
Labs were overall ok - anemia is still present but better. B12 level was low normal. I think the blood doctor is planning to review labwork at her f/u visit early next month.

## 2018-07-20 NOTE — Telephone Encounter (Signed)
Spoke with pt relaying Dr. Synthia Innocent message.  Pt verbalizes understanding and expresses her thanks for the call.

## 2018-07-22 ENCOUNTER — Telehealth: Payer: Self-pay | Admitting: Hematology and Oncology

## 2018-07-22 NOTE — Telephone Encounter (Signed)
Patient called to reschedule  °

## 2018-08-01 ENCOUNTER — Ambulatory Visit: Payer: Self-pay | Admitting: Hematology and Oncology

## 2018-08-02 ENCOUNTER — Encounter: Payer: Self-pay | Admitting: Gastroenterology

## 2018-08-09 ENCOUNTER — Encounter: Payer: Self-pay | Admitting: Hematology and Oncology

## 2018-08-09 ENCOUNTER — Inpatient Hospital Stay: Payer: Medicare HMO | Attending: Hematology and Oncology | Admitting: Hematology and Oncology

## 2018-08-09 ENCOUNTER — Other Ambulatory Visit: Payer: Self-pay | Admitting: Hematology and Oncology

## 2018-08-09 VITALS — BP 145/89 | HR 93 | Temp 97.5°F | Resp 18 | Ht 62.0 in | Wt 129.5 lb

## 2018-08-09 DIAGNOSIS — R7989 Other specified abnormal findings of blood chemistry: Secondary | ICD-10-CM | POA: Diagnosis not present

## 2018-08-09 DIAGNOSIS — R739 Hyperglycemia, unspecified: Secondary | ICD-10-CM

## 2018-08-09 DIAGNOSIS — K2210 Ulcer of esophagus without bleeding: Secondary | ICD-10-CM | POA: Diagnosis not present

## 2018-08-09 DIAGNOSIS — F419 Anxiety disorder, unspecified: Secondary | ICD-10-CM | POA: Diagnosis not present

## 2018-08-09 DIAGNOSIS — Z79899 Other long term (current) drug therapy: Secondary | ICD-10-CM | POA: Diagnosis not present

## 2018-08-09 DIAGNOSIS — D509 Iron deficiency anemia, unspecified: Secondary | ICD-10-CM | POA: Diagnosis not present

## 2018-08-09 DIAGNOSIS — J449 Chronic obstructive pulmonary disease, unspecified: Secondary | ICD-10-CM | POA: Insufficient documentation

## 2018-08-09 DIAGNOSIS — M810 Age-related osteoporosis without current pathological fracture: Secondary | ICD-10-CM | POA: Diagnosis not present

## 2018-08-09 DIAGNOSIS — D539 Nutritional anemia, unspecified: Secondary | ICD-10-CM

## 2018-08-09 DIAGNOSIS — F319 Bipolar disorder, unspecified: Secondary | ICD-10-CM

## 2018-08-09 DIAGNOSIS — K210 Gastro-esophageal reflux disease with esophagitis: Secondary | ICD-10-CM | POA: Insufficient documentation

## 2018-08-09 NOTE — Patient Instructions (Signed)
We discussed in detail the results of your lab work from November 11.  At that time you had just received an infusion of iron prior to your first visit.  That iron is continuing to work.  At that time your hemoglobin was rising slowly, 10.8 g/dL  Because your vitamin W86 and folic acid level were low-normal, the metabolite of those vitamins has been requested to confirm or not, normal vitamin cofactors in the production of normal red blood cells.  Copies of your lab work were given for review.  At the time of your next visit, a repeat iron level (ferritin) will be obtained along with the metabolite of vitamin H68 and folic acid, methylmalonic acid and serum homocysteine.  A follow-up appoint with lab work has been scheduled on December 19.  Because I am leaving the practice, you will be seeing a new provider.  Please do not hesitate to call should any new or untoward problems arise.  Wishing you the happiest and healthiest of holiday seasons. Merry Christmas! Happy New Year!

## 2018-08-09 NOTE — Progress Notes (Signed)
Hematology/Oncology Outpatient Progress Note  Patient Name:  Robin Welch  DOB: 20-Sep-1965   Date of Service: August 09, 2018  Referring Provider: Ria Bush, MD [place],  [place] [address]   Consulting Physician: Henreitta Leber, MD Hematology/Oncology  Reason for Visit: In the setting of anemia and thrombocytosis, she presents now to discuss the results of her preliminary laboratory evaluation and recommendations.  Brief History: Robin Welch is a 52 year old resident of [place] whose past medical history is significant for anxiety disorder; bipolar disorder; chronic obstructive pulmonary disease; osteoporosis; peptic ulcer disease; gastroesophageal reflux disease; chronic dysphasia; active tobacco dependence; and former substance abuse. Her primary care physician is Dr. Ria Bush.  She is alone at this visit.  Robin Welch has been aware of her anemia for at least the past 2 years. She has been treated with parenteral iron infusion at least 3 times over the past 2 years.  Her last iron infusion was in early November.  She does not tolerante oral iron.  It causes her to be nauseous and vomit.  She underwent in the past several weeks upper endoscopic evaluation which revealed esophageal ulceration and esophagitis.   Although scheduled, she has never had a screening colonoscopy. With medication, her symptoms have improved significantly. She continues to smoke 1-1/2 packs of cigarettes daily.  She stopped recreational drug use 14 years ago. In the past she drank heavily. For the past 5 years she has not consumed any alcoholic beverages.  In the setting of esophageal ulceration and esophagitis, she has experienced chronic dysphagia to solids. Likewise her voice is chronically raspy but unchanged. She was told this was a consequence of her esophagitis. She denies viral hepatitis, inflammatory bowel disease, or symptomatic diverticulosis.   At the time of our  initial visit, we discussed her lab work over the past 3 months, which revealed chronic hyponatremia and hypochloremia.  Her BUN is markedly low. This generally represents some form of nutritional deficit. Her low serum albumin also suggests undernutrition.  It could also, however reflect some form of water intoxication. She admits to drinking copious amounts of "sweet tea."    It is unclear how long her alkaline phosphatase has been widely elevated. This generally implies some form of infiltrative liver disease. Given her history, a repeat abdominal ultrasound and/or CT imaging may be useful given her history and young age.  It is with this background she presents now for the results of her lab work and reevaluation in the setting of anemia and likely reactive thrombocytosis as outlined above.  Interval History: In the interim since her last visit, she reports no new problems or complaints. Over the past 13 months, she is gained back 15 pounds.  Her energy level is fairly robust. Her appetite is stable. She has no new visual changes or hearing deficit.  She reports her chronic hacking cough with Guerrini productive phlegm.  There is no sore throat or orthopnea.  She denies dyspnea at rest. She has chronic but stable exertional dyspnea.  There is no pain on swallowing.  No fever, shaking chills, sweats, or flulike symptoms are evident.  Her heartburn and dyspepsia have improved with medication. There is no nausea, vomiting, or diarrhea.  She has straining at stool and a tendency toward constipation. She moves her bowels 1-2 weekly.  MiraLAX appears to be effective. There is no melena or bright red blood per rectum. She reports no urinary frequency, urgency, hematuria, dysuria.  There are no arthralgias or  myalgias. She denies any numbness or tingling in the fingers or toes.   Past Medical History:  Diagnosis Date  . Anxiety   . Bipolar 1 disorder [facility])    sees Dr. Candis Schatz psych [phone])  . COPD  (chronic obstructive pulmonary disease) ([place]) 03/2013   hyperinflation by CXR  . History of cocaine dependence ([place]) latest 2017   undergoing detox  . History of stomach ulcers   . Hx: UTI (urinary tract infection)   . Migraine    "stopped in ~ 2008" (03/11/2017)  . Osteoporosis 06/09/2018   DEXA 05/2017 - T score -2.5 L hip, -1.3 spine  . Positive PPD 1989   s/p CXR and INH 6 mo  . Seizure ([place]) X 1   "from takiing too many headache medicine?" (03/11/2017)  . Smoker    Past Surgical History:  Procedure Laterality Date  . DILATION AND CURETTAGE OF UTERUS  X 1   S/P miscarriage  . ESOPHAGOGASTRODUODENOSCOPY  03/2017   candidal esophagitis, treated, LA grade D esophagitis, 4cm HH (Nandigam)  . LAPAROSCOPIC CHOLECYSTECTOMY  2006  . TUBAL LIGATION  2003   Allergies  Allergen Reactions  . Prozac [Fluoxetine Hcl] Swelling and Rash  . Doxycycline Nausea And Vomiting  . Celexa [Citalopram Hydrobromide] Other (See Comments)    Possible allergic reaction  . Chantix [Varenicline Tartrate] Swelling    Mouth swelling  . Hydroxyzine     Not effective  . Klonopin [Clonazepam]     Caused instablility. Not able to drive due to over medicated  . Lunesta [Eszopiclone]     Not effective  . Sonata [Zaleplon]     Not effective  . Trazodone And Nefazodone     ineffective  . Zolpimist [Zolpidem Tartrate]     Not effective  . Azithromycin     Reaction not recalled by the patient  She has no food or seasonal allergies  Current Outpatient Medications on File Prior to Visit  Medication Sig  . acetaminophen (TYLENOL) 500 MG tablet Take 1,000 mg by mouth every 6 (six) hours as needed for mild pain or moderate pain.  Marland Kitchen albuterol (VENTOLIN HFA) 108 (90 Base) MCG/ACT inhaler Inhale 1-2 puffs into the lungs every 6 (six) hours as needed for wheezing or shortness of breath.  . Calcium 500 MG CHEW Chew 1 tablet (500 mg total) by mouth 2 (two) times daily.  Marland Kitchen CARAFATE 1 GM/10ML suspension Take 10 mLs  (1 g total) by mouth 4 (four) times daily.  . Cholecalciferol (VITAMIN D) 2000 units CAPS Take 1 capsule (2,000 Units total) by mouth daily.  Marland Kitchen gabapentin (NEURONTIN) 300 MG capsule Take 2 capsules (600 mg total) by mouth 2 (two) times daily.  . hydrOXYzine (ATARAX/VISTARIL) 25 MG tablet Take 2 tablets (50 mg total) by mouth 3 (three) times daily.  Marland Kitchen OLANZapine (ZYPREXA) 15 MG tablet Take 1 tablet (15 mg total) by mouth at bedtime.  . ondansetron (ZOFRAN-ODT) 4 MG disintegrating tablet Take 1 tablet (4 mg total) by mouth every 8 (eight) hours as needed for nausea or vomiting.  . pantoprazole (PROTONIX) 40 MG tablet Take 1 tablet (40 mg total) by mouth 2 (two) times daily before a meal.  . SF 5000 PLUS 1.1 % CREA dental cream Place 1 application onto teeth at bedtime.   . Tiotropium Bromide Monohydrate (SPIRIVA RESPIMAT) 2.5 MCG/ACT AERS Inhale 1 puff into the lungs daily.  . VENTOLIN HFA 108 (90 Base) MCG/ACT inhaler INHALE TWO puffs into THE lungs EVERY SIX HOURS  AS NEEDED wheezind OR SHORTNESS OF BREATH  . QUEtiapine (SEROQUEL) 400 MG tablet Take 2 tablets (800 mg total) by mouth at bedtime.  . ziprasidone (GEODON) 80 MG capsule Take 1 capsule (80 mg total) by mouth daily.   No current facility-administered medications on file prior to visit.     Review of Systems: Constitutional: No fever, sweats, or shaking chills.  No appetite or weight deficit.  Energy level improved. Skin: No rash, scaling, sores, lumps, or jaundice. HEENT: No visual changes or hearing deficit. Pulmonary: Chronic hacking morning cough with Dittmar productive phlegm; no sore throat, or orthopnea; COPD; raspy voice. Breasts: No complaints. Cardiovascular: No coronary artery disease, angina, or myocardial infarction.  No cardiac dysrhythmia, essential hypertension, or dyslipidemia. Gastrointestinal: No indigestion, abdominal pain, diarrhea, or constipation.  Chronic dysphagia to solids.  No change in bowel habits.  GERD; PUD;  status post EGD several weeks ago. Genitourinary: No urinary frequency, urgency, hematuria, or dysuria. Musculoskeletal: No arthralgias or myalgias; no joint swelling, pain, or instability; osteoporosis. Hematologic: No bleeding tendency or easy bruisability. Endocrine: No intolerance to hot or cold; no thyroid disease or diabetes mellitus. Vascular: No peripheral arterial or venous thromboembolic disease. Psychological: Chronic anxiety disorder; bipolar disorder; no mood changes; former alcohol and substance abuse; active tobacco dependence. Neurological: No dizziness, lightheadedness, syncope, or near syncopal episodes; no numbness or tingling in the fingers or toes.  Physical Examination: Vital Signs: Body surface area is 1.6 meters squared.  Vitals:   08/09/18 1615  BP: (!) 145/89  Pulse: 93  Resp: 18  Temp: (!) 97.5 F (36.4 C)  SpO2: 100%    Filed Weights   08/09/18 1615  Weight: 129 lb 8 oz (58.7 kg)  ECOG PERFORMANCE STATUS: 0 Constitutional:  Robin Welch is fully nourished and developed. She looks age appropriate. She is friendly and cooperative without respiratory compromise at rest. Skin: No rashes, scaling, dryness, jaundice, or itching. HEENT: Head is normocephalic and atraumatic. Pupils are equal round and reactive to light and accommodation.  Sclerae are anicteric. Conjunctivae are pink.  Her voice is coarse and raspy.  No sinus tenderness nor oropharyngeal lesions.  Lips without cracking or peeling; tongue without mass, inflammation, or nodularity.  Mucous membranes are moist. Neck: Supple and symmetric. No jugular venous distention or thyromegaly. Trachea is midline. Lymphatics: No cervical or supraclavicular lymphadenopathy.  No epitrochlear, axillary, or inguinal lymphadenopathy is appreciated. Respiratory/chest: Thorax is symmetrical.  Breath sounds are clear to auscultation and percussion.  Normal excursion and respiratory effort. Back: Symmetric without deformity  or tenderness. Cardiovascular: Heart rate and rhythm are regular without murmur. Gastrointestinal: Abdomen is soft, nontender; no organomegaly.  Bowel sounds are normoactive.  No masses are appreciated. Extremities: In the lower extremities, there is no asymmetric swelling, erythema, tenderness, or cord formation.  No clubbing, cyanosis, nor edema. Hematologic: No petechiae, hematomas, or ecchymoses. Psychological:  She is oriented to person, place, and time; normal affect. Neurological: There are no gross neurologic deficits.  Laboratory Results: I have reviewed the data as listed: July 11, 2018  Ref Range & Units 10:50 3wk ago 4wk ago 50moago 383mogo  WBC Count 4.0 - 10.5 K/uL 8.0  8.9 R 6.0  5.9  9.5   RBC 3.87 - 5.11 MIL/uL 4.12  4.03 R 4.03 R 3.99  3.54Low  R  Hemoglobin 12.0 - 15.0 g/dL 10.8Low   10.1Low  R 10.2Low   9.8Low   7.9 Repeated and verified X2.Low Panic    HCT 36.0 -  46.0 % 34.5Low   31.7Low  R 31.9Low   30.9Low   24.4Low    MCV 80.0 - 100.0 fL 83.7  78.7Low   79.2 R 77.4Low  R 69.0Low  R  MCH 26.0 - 34.0 pg 26.2  25.1Low  R  24.6Low     MCHC 30.0 - 36.0 g/dL 31.3  31.9Low  R 31.9  31.7  32.2   RDW 11.5 - 15.5 % 19.0High   19.4High  R 23.5High   24.3High   17.3High    Platelet Count 150 - 400 K/uL 547High   621High  R 696.0High  R 622High  CM 530.0High  R  nRBC 0.0 - 0.2 % 0.2       Neutrophils Relative % % 68   53.9 R  70.5 R  Neutro Abs 1.7 - 7.7 K/uL 5.4   3.3 R  6.7 R  Lymphocytes Relative % 19   29.2 R  16.5 R  Lymphs Abs 0.7 - 4.0 K/uL 1.6   1.8   1.6   Monocytes Relative % 7   13.8High  R  11.9 R  Monocytes Absolute 0.1 - 1.0 K/uL 0.6   0.8   1.1High    Eosinophils Relative % 5   2.2 R  0.7 R  Eosinophils Absolute 0.0 - 0.5 K/uL 0.4   0.1 R  0.1 R  Basophils Relative % 1   0.9 R  0.4 R  Basophils Absolute 0.0 - 0.1 K/uL 0.1   0.1   0.0   Immature Granulocytes % 0       Abs Immature Granulocytes 0.00 - 0.07 K/uL 0.03             Ref Range & Units 10:50  3wk ago 4wk ago 6moago 362mogo  Sodium 135 - 145 mmol/L 131Low   129Low  R 128Low  R 132Low   124Low  R  Potassium 3.5 - 5.1 mmol/L 3.8  3.5 R 3.5 R 2.9Low   3.5 R  Chloride 98 - 111 mmol/L 94Low   96 R 94Low  R 95Low   86Low  R  CO2 22 - 32 mmol/L 27  24 R 29 R 24  30 R  Glucose, Bld 70 - 99 mg/dL 87  136High   93  139High   90   BUN 6 - 20 mg/dL <4Low   4Low  R 2Low  R <5Low   5Low  R  Creatinine, Ser 0.44 - 1.00 mg/dL 0.68  0.70 R 0.58 R 0.69  0.72 R  Calcium 8.9 - 10.3 mg/dL 8.6Low   8.1Low  R 8.1Low  R 8.3Low   8.5 R  Total Protein 6.5 - 8.1 g/dL 6.8     6.4 R  Albumin 3.5 - 5.0 g/dL 3.3Low      3.6 R  AST 15 - 41 U/L 15     13 R  ALT 0 - 44 U/L 13     10 R  Alkaline Phosphatase 38 - 126 U/L 147High      78 R  Total Bilirubin 0.3 - 1.2 mg/dL 0.4     0.3 R  GFR calc non Af Amer >60 mL/min >60    >60    GFR calc Af Amer >60 mL/min >60    >60 CM     Ref Range & Units 10:50  Retic Ct Pct 0.4 - 3.1 % 3.6High    RBC. 3.87 - 5.11 MIL/uL 4.12   Retic Count,  Absolute 19.0 - 186.0 K/uL 149.6   Immature Retic Fract 2.3 - 15.9 % 30.0High    Reticulocyte Hemoglobin >27.9 pg 33.6   Comment:     Given the high negative predictive  value of a RET-He result > 32 pg  iron deficiency is essentially  excluded. If this patient is anemic  other etiologies should be  considered.   LDH 177 Vitamin Z76 734 Folic acid 6 Iron/TIBC 132/316 Iron saturation 42% Ferritin 814 Haptoglobin 238  Erythropoietin 16.9  Diagnostic/Imaging Studies: May 05, 2018 CHEST - 2 VIEW  COMPARISON:  04/20/2017  FINDINGS: The cardiomediastinal silhouette is within normal limits. Lung volumes are lower than on the prior study. No airspace consolidation, edema, pleural effusion, or pneumothorax is identified. No acute osseous abnormality is seen.  IMPRESSION: No active cardiopulmonary disease.  Logan Bores M.D. 05/05/2018 08:51  Summary/Assessment: In the setting of anemia and  thrombocytosis, she presents now to discuss the results of her preliminary laboratory evaluation and recommendations.  Robin Welch has been aware of her anemia for at least the past 2 years. She has been treated with parenteral iron infusion at least 3 times over the past 2 years.  Her last iron infusion was in early November.  She does not tolerante oral iron.  It causes her to be nauseous and vomit.  She underwent in the past several weeks upper endoscopic evaluation which revealed esophageal ulceration and esophagitis.   Although scheduled, she has never had a screening colonoscopy. With medication, her symptoms have improved significantly. She continues to smoke 1-1/2 packs of cigarettes daily.  She stopped recreational drug use 14 years ago. In the past she drank heavily. For the past 5 years she has not consumed any alcoholic beverages.  In the setting of esophageal ulceration and esophagitis, she has experienced chronic dysphagia to solids. Likewise her voice is chronically raspy but unchanged. She was told this was a consequence of her esophagitis. She denies viral hepatitis, inflammatory bowel disease, or symptomatic diverticulosis.   At the time of our initial visit, we discussed her lab work over the past 3 months, which revealed chronic hyponatremia and hypochloremia.  Her BUN is markedly low. This generally represents some form of nutritional deficit. Her low serum albumin also suggests undernutrition.  It could also, however reflect some form of water intoxication. She admits to drinking copious amounts of "sweet tea."    It is unclear how long her alkaline phosphatase has been widely elevated. This generally implies some form of infiltrative liver disease. Given her history, a repeat abdominal ultrasound and/or CT imaging may be useful given her history and young age.   Her other comorbid problems include anxiety disorder; bipolar disorder; chronic obstructive pulmonary disease; osteoporosis;  peptic ulcer disease; gastroesophageal reflux disease; chronic dysphasia; active tobacco dependence; and former substance abuse.   In the interim since her last visit, she reports no new problems or complaints. Over the past 13 months, she is gained back 15 pounds.  Her energy level is fairly robust. Her appetite is stable. She has no new visual changes or hearing deficit.  She reports her chronic hacking cough with Guiney productive phlegm.  There is no sore throat or orthopnea.  She denies dyspnea at rest. She has chronic but stable exertional dyspnea.  There is no pain on swallowing.  No fever, shaking chills, sweats, or flulike symptoms are evident.  Her heartburn and dyspepsia have improved with medication. There is no nausea, vomiting, or diarrhea.  She has  straining at stool and a tendency toward constipation. She moves her bowels 1-2 weekly.  MiraLAX appears to be effective. There is no melena or bright red blood per rectum. She reports no urinary frequency, urgency, hematuria, dysuria.  There are no arthralgias or myalgias. She denies any numbness or tingling in the fingers or toes.   Her other comorbid problems include anxiety disorder; bipolar disorder; chronic obstructive pulmonary disease; osteoporosis; peptic ulcer disease; gastroesophageal reflux disease; chronic dysphasia; active tobacco dependence; and former substance abuse.   Recommendation/Plan: We discussed in detail the results of her laboratory studies from November 11.  Those results are outlined above.  Because she was recently given parenteral iron infusion, her iron studies were difficult to interpret.  Certainly there is no convincing evidence of iron deficiency at present.  Both her anemia and thrombocytosis persist.  Although barely within the low normal range, folic acid and vitamin B12 levels are normal. Those levels, however, frequently reflect dietary intake and not the storage or utilization of those important vitamins.   For that reason, the metabolite of vitamin I75 and folic acid, methylmalonic acid and serum homocysteine were requested at the time of her next visit.  Although her serum erythropoietin level was normal, a JAK 2 V617F, exon 12, MPL, and CALR gene mutation was requested to exclude an underlying myeloproliferative disorder.  Her hyponatremia and hypochloremia and low BUN may be the consequence of water intoxication. She drinks copious amounts of "sweet tea."  She was advised to limit her intake of free water to 1-1/2-2 quarts daily.  Because of her chronic constipation, it was recommended that she continue MiraLAX: 17 g in 8 ounces of water 1-2 times daily to maintain a daily bowel movement.  In the setting of iron deficiency, she needs a diagnostic colonoscopy.  This will be arranged through the office of Dr. Danise Mina.  Neveah was advised to titrate downward and eventually discontinue cigarette smoking for multiple health associated reasons. Through her primary care office, both pharmacologic and/or behavioral intervention should be considered.  Although unrelated to anemia and thrombocytosis, because of her smoking history, she is a candidate for primary lung cancer screening.  Barring any unforeseen complications, her next scheduled doctor visit with lab work is on December 19 , 2019.  Since I am leaving the practice, she will have a new provider following her care.  Robin Welch was advised to call us in the interim should any new or untoward problems arise.  This note was dictated using voice activated technology/software.  Unfortunately, typographical errors are not uncommon, and transcription is subject to mistakes and regrettably misinterpretation.  If necessary, clarification of the above information can be discussed with me at any time.  FOLLOW UP: AS DIRECTED   cc:         Ria Bush, MD         Henreitta Leber, MD  Hematology/Oncology [facility] [address] [place], [place] [address] Office: [phone] PHKF: [phone]

## 2018-08-10 ENCOUNTER — Telehealth: Payer: Self-pay | Admitting: Hematology and Oncology

## 2018-08-11 ENCOUNTER — Other Ambulatory Visit: Payer: Self-pay | Admitting: Hematology

## 2018-08-11 ENCOUNTER — Other Ambulatory Visit: Payer: Self-pay | Admitting: *Deleted

## 2018-08-11 DIAGNOSIS — D509 Iron deficiency anemia, unspecified: Secondary | ICD-10-CM

## 2018-08-11 DIAGNOSIS — D500 Iron deficiency anemia secondary to blood loss (chronic): Secondary | ICD-10-CM

## 2018-08-14 ENCOUNTER — Encounter: Payer: Self-pay | Admitting: Family Medicine

## 2018-08-15 ENCOUNTER — Telehealth: Payer: Self-pay | Admitting: Hematology

## 2018-08-15 NOTE — Telephone Encounter (Signed)
R/s appt per 12/16 sch message - pt is aware of new appt date and time

## 2018-08-16 ENCOUNTER — Inpatient Hospital Stay: Payer: Medicare HMO | Admitting: Hematology

## 2018-08-16 ENCOUNTER — Inpatient Hospital Stay: Payer: Medicare HMO

## 2018-08-17 ENCOUNTER — Ambulatory Visit: Payer: Self-pay | Admitting: Gastroenterology

## 2018-08-23 ENCOUNTER — Encounter: Payer: Self-pay | Admitting: Gastroenterology

## 2018-09-01 ENCOUNTER — Ambulatory Visit: Payer: Medicare HMO | Admitting: Psychiatry

## 2018-09-02 ENCOUNTER — Encounter: Payer: Self-pay | Admitting: Emergency Medicine

## 2018-09-02 DIAGNOSIS — F411 Generalized anxiety disorder: Secondary | ICD-10-CM | POA: Insufficient documentation

## 2018-09-02 DIAGNOSIS — G4700 Insomnia, unspecified: Secondary | ICD-10-CM | POA: Insufficient documentation

## 2018-09-03 DIAGNOSIS — M25551 Pain in right hip: Secondary | ICD-10-CM | POA: Diagnosis not present

## 2018-09-06 ENCOUNTER — Telehealth: Payer: Self-pay | Admitting: Hematology

## 2018-09-06 ENCOUNTER — Ambulatory Visit (INDEPENDENT_AMBULATORY_CARE_PROVIDER_SITE_OTHER): Payer: Medicare HMO | Admitting: Family Medicine

## 2018-09-06 ENCOUNTER — Encounter: Payer: Self-pay | Admitting: Family Medicine

## 2018-09-06 ENCOUNTER — Encounter: Payer: Self-pay | Admitting: Gastroenterology

## 2018-09-06 VITALS — BP 112/66 | HR 115 | Temp 98.1°F | Ht 62.0 in | Wt 138.5 lb

## 2018-09-06 DIAGNOSIS — M1611 Unilateral primary osteoarthritis, right hip: Secondary | ICD-10-CM

## 2018-09-06 DIAGNOSIS — M25551 Pain in right hip: Secondary | ICD-10-CM | POA: Diagnosis not present

## 2018-09-06 MED ORDER — OXYCODONE HCL 5 MG PO TABS: 5.0000 mg | ORAL_TABLET | ORAL | 0 refills | Status: DC | PRN

## 2018-09-06 NOTE — Telephone Encounter (Signed)
R/s appt per 1/6 sch message - pt is aware of appt date and time

## 2018-09-06 NOTE — Patient Instructions (Addendum)
I will try to find out if the provider here is able to do a hip injection. And if so will have someone call you tomorrow.   Otherwise, I've placed a referral to orthopedic surgery for evaluation and consideration for hip injection as this is something they should be able to do.   I have prescribed a stronger pain reliever but really Ibuprofen is the best medication you could use. Only use this if the pain is severe and not improving.   As we discussed you should schedule a follow-up with Ria Bush, MD to discuss your other medications

## 2018-09-06 NOTE — Progress Notes (Signed)
Subjective:     Robin Welch is a 53 y.o. female presenting for Fall (fell on 08/26/18, lost balance and fel ldown the steps. Fell on the left side. Went to urgent Care on 09/03/2018. Did right hip xray and it was normal.) and Hip Pain (started to hurt in the right hip and right leg on 09/01/2018 and it is getting worse. Not able to walk due to the pain.)     HPI   #Hip Pain (Right hip) - prior fall on the left side w/o fracture - Pain started last Thursday - had xray of the right hip at urgent care on 09/03/2018 - tried using a walker - no pain immediately after the fall - worse with ambulation - feels like there is grinding and it rubbing  - taking tylenol, motrin, and heat pads w/o help - pain is the lateral hip and posterior with pain location - no radiation - difficulty weight bearing - pain is worse after rest with first step, and better with time - now pain is severe - pain is improved with rest - no back pain   Review of Systems   Social History   Tobacco Use  Smoking Status Current Every Day Smoker  . Packs/day: 1.00  . Years: 36.00  . Pack years: 36.00  . Types: Cigarettes  Smokeless Tobacco Never Used        Objective:    BP Readings from Last 3 Encounters:  09/06/18 112/66  08/09/18 (!) 145/89  07/11/18 128/79   Wt Readings from Last 3 Encounters:  09/06/18 138 lb 8 oz (62.8 kg)  08/09/18 129 lb 8 oz (58.7 kg)  07/11/18 130 lb 4.8 oz (59.1 kg)    BP 112/66   Pulse (!) 115   Temp 98.1 F (36.7 C)   Ht 5\' 2"  (1.575 m)   Wt 138 lb 8 oz (62.8 kg)   LMP 04/23/2015 (Approximate)   SpO2 98%   BMI 25.33 kg/m    Physical Exam Constitutional:      General: She is not in acute distress.    Appearance: She is well-developed. She is not diaphoretic.  HENT:     Right Ear: External ear normal.     Left Ear: External ear normal.     Nose: Nose normal.     Mouth/Throat:     Comments: Hoarse voice Eyes:     Conjunctiva/sclera: Conjunctivae normal.   Neck:     Musculoskeletal: Neck supple.  Cardiovascular:     Rate and Rhythm: Normal rate.  Pulmonary:     Effort: Pulmonary effort is normal.  Musculoskeletal:     Comments: Back: no spinous process TTP or paraspinous TTP Right hip:  No swelling ROM - some decreased external rotation, otherwise normal flexion, pain with hip extension which limited exam Pain anterior with FADIR testing. No pain with FABER   Skin:    General: Skin is warm and dry.     Capillary Refill: Capillary refill takes less than 2 seconds.  Neurological:     Mental Status: She is alert. Mental status is at baseline.  Psychiatric:        Mood and Affect: Mood normal.        Behavior: Behavior normal.    Care Everywhere Hip x-ray Interface, Rad Results In - 09/03/2018  4:10 PM EST X-RAY RIGHT HIP (2+ VIEWS), 09/03/2018 3:35 PM  INDICATION: fell done steps 7 days ago and getting worse. h/o osteoporosis(not treated) \ M25.551 Right hip  pain  COMPARISON: None.  CONCLUSION:  1.  No acute fracture or malalignment.     2.  Mild degenerative changes of the bilateral hips and pubic symphysis. 3.  Bone island within the left femoral neck. 4.  Osteopenia.  NOTE: This examination and preliminary report were reviewed under the supervision of the senior diagnostic radiology resident on call.      Assessment & Plan:   Problem List Items Addressed This Visit    None    Visit Diagnoses    Arthritis of right hip    -  Primary   Relevant Medications   oxyCODONE (OXY IR/ROXICODONE) 5 MG immediate release tablet   Other Relevant Orders   Ambulatory referral to Orthopedic Surgery   Acute right hip pain       Relevant Medications   oxyCODONE (OXY IR/ROXICODONE) 5 MG immediate release tablet   Other Relevant Orders   Ambulatory referral to Orthopedic Surgery     Imaging results and exam concerning for possible hip arthritis as the cause of pain, though difficult to determine if pain was more lateral vs internal  hip source. May have both pathologies.   Referral to orthopedic for evaluation and consideration for US guided hip injection if appropriate.   Would also want PT following improvement in pain.    Return if symptoms worsen or fail to improve.  [name], MD

## 2018-09-07 ENCOUNTER — Inpatient Hospital Stay: Payer: Medicare HMO | Admitting: Hematology

## 2018-09-07 ENCOUNTER — Inpatient Hospital Stay: Payer: Medicare HMO

## 2018-09-07 DIAGNOSIS — M25551 Pain in right hip: Secondary | ICD-10-CM | POA: Diagnosis not present

## 2018-09-10 DIAGNOSIS — M25551 Pain in right hip: Secondary | ICD-10-CM | POA: Diagnosis not present

## 2018-09-12 ENCOUNTER — Ambulatory Visit: Payer: Self-pay | Admitting: Gastroenterology

## 2018-09-13 ENCOUNTER — Inpatient Hospital Stay: Payer: Medicare HMO | Admitting: Hematology

## 2018-09-13 ENCOUNTER — Inpatient Hospital Stay: Payer: Medicare HMO | Attending: Hematology and Oncology

## 2018-09-14 ENCOUNTER — Telehealth: Payer: Self-pay

## 2018-09-14 DIAGNOSIS — M810 Age-related osteoporosis without current pathological fracture: Secondary | ICD-10-CM

## 2018-09-14 NOTE — Telephone Encounter (Signed)
Pt left v/m that had previously discussed osteoporosis treatment with a shot rather than oral med. Pt request cb so can get started with treatment.Please advise.

## 2018-09-14 NOTE — Telephone Encounter (Signed)
plz run patient through prolia assistance. I believe we were waiting on the new year?  Pt will need calcium level checked prior to shot.

## 2018-09-14 NOTE — Telephone Encounter (Signed)
Last Ca lab abnormal. (see 06/13/2018 phone note.) pls advise

## 2018-09-16 NOTE — Telephone Encounter (Signed)
Information has been submitted to pts insurance for verification of benefits. Awaiting response for coverage  

## 2018-09-19 DIAGNOSIS — M6281 Muscle weakness (generalized): Secondary | ICD-10-CM | POA: Diagnosis not present

## 2018-09-19 DIAGNOSIS — M25651 Stiffness of right hip, not elsewhere classified: Secondary | ICD-10-CM | POA: Diagnosis not present

## 2018-09-19 DIAGNOSIS — R262 Difficulty in walking, not elsewhere classified: Secondary | ICD-10-CM | POA: Diagnosis not present

## 2018-09-19 DIAGNOSIS — M25551 Pain in right hip: Secondary | ICD-10-CM | POA: Diagnosis not present

## 2018-09-20 DIAGNOSIS — M25551 Pain in right hip: Secondary | ICD-10-CM | POA: Diagnosis not present

## 2018-09-26 ENCOUNTER — Telehealth: Payer: Self-pay | Admitting: Family Medicine

## 2018-09-26 ENCOUNTER — Ambulatory Visit: Payer: Self-pay | Admitting: Family Medicine

## 2018-09-26 NOTE — Telephone Encounter (Signed)
Pt called office stating she was diagnosed with osteoporosis. Pt is requesting a call because she wants to know the next steps to being treated. Pt says she was diagnosed about 6 months ago.

## 2018-09-27 NOTE — Telephone Encounter (Signed)
Spoke with pt relaying Dr. G's message.  Verbalizes understanding.  

## 2018-09-27 NOTE — Telephone Encounter (Addendum)
I believe are awaiting insurance coverage verification for prolia.

## 2018-09-30 ENCOUNTER — Telehealth: Payer: Self-pay | Admitting: Family Medicine

## 2018-09-30 MED ORDER — TRAMADOL HCL 50 MG PO TABS: 50.0000 mg | ORAL_TABLET | Freq: Three times a day (TID) | ORAL | 0 refills | Status: DC | PRN

## 2018-09-30 NOTE — Telephone Encounter (Signed)
Saw Dr Einar Pheasant with hip pain treated with oxycodone #10. Saw Dr Alfonso Ramus 1/8 and given another few hydrocodone Rx last few weeks. Planned MRI r/o occult fracture.  plz call - what did MRI show? What is ortho plan?   Will send in tramadol course for her but want f/u with ortho or here in office for further pain meds.  [place] CSRS reviewed.

## 2018-09-30 NOTE — Telephone Encounter (Signed)
Patient says MRI did not show anything except maybe bruising.  Patient says Ortho sent her to PT but she went one time and when she was leaving, something popped and she couldn't even get out of the parking lot so she hasn't went back until she could get some better.  Patient plans to start back to PT.  Patient advised that Tramadol was sent in to pharmacy but a visit here or to Ortho would be needed for any further pain meds.

## 2018-09-30 NOTE — Telephone Encounter (Signed)
Pt is having pain in her leg and want to know if she can get a prescription for tramdol. Please advise pt.   Sent to [facility]

## 2018-10-03 ENCOUNTER — Ambulatory Visit: Payer: Medicare HMO | Admitting: Psychiatry

## 2018-10-04 NOTE — Telephone Encounter (Signed)
Spoke with pt relaying Dr. Synthia Innocent message.  Pt verbalizes understanding and agrees to trying Reclast.   Placed order in Dr. Synthia Innocent box.

## 2018-10-04 NOTE — Telephone Encounter (Addendum)
plz notify patient - prolia was denied by insurance.  She must first try reclast which is a once yearly infusion of an IV bisphosphonate (like fosamax) - if she's willing to try this we can place order for infusion at short stay at [facility].

## 2018-10-04 NOTE — Addendum Note (Signed)
Addended by: Ria Bush on: 10/04/2018 06:42 PM   Modules accepted: Orders

## 2018-10-04 NOTE — Telephone Encounter (Signed)
Spoke to [employer] and under her new benefits, pt must first try and fail Reclast before coverage for prolia will be available

## 2018-10-04 NOTE — Telephone Encounter (Addendum)
Order written.  Needs to come in for calcium and vitamin D levels prior to getting it done.  Pt should not have any dental work for a year after receiving infusion.  Needs to watch for jaw pain, hip pain on this medicine and if that develops let us know right away for evaluation as this medicine can be associated with atypical bone growth.

## 2018-10-05 ENCOUNTER — Ambulatory Visit (INDEPENDENT_AMBULATORY_CARE_PROVIDER_SITE_OTHER): Payer: Medicare HMO | Admitting: Family Medicine

## 2018-10-05 ENCOUNTER — Encounter: Payer: Self-pay | Admitting: Family Medicine

## 2018-10-05 VITALS — BP 120/76 | HR 109 | Temp 97.9°F | Ht 62.0 in | Wt 129.8 lb

## 2018-10-05 DIAGNOSIS — M810 Age-related osteoporosis without current pathological fracture: Secondary | ICD-10-CM | POA: Diagnosis not present

## 2018-10-05 DIAGNOSIS — S76011D Strain of muscle, fascia and tendon of right hip, subsequent encounter: Secondary | ICD-10-CM | POA: Diagnosis not present

## 2018-10-05 HISTORY — DX: Strain of muscle, fascia and tendon of right hip, subsequent encounter: S76.011D

## 2018-10-05 MED ORDER — TRAMADOL HCL 50 MG PO TABS: 50.0000 mg | ORAL_TABLET | Freq: Three times a day (TID) | ORAL | 0 refills | Status: DC | PRN

## 2018-10-05 MED ORDER — METHOCARBAMOL 500 MG PO TABS: 500.0000 mg | ORAL_TABLET | Freq: Three times a day (TID) | ORAL | 0 refills | Status: DC | PRN

## 2018-10-05 NOTE — Telephone Encounter (Signed)
Pt here today for OV.  Relayed Dr. Synthia Innocent message and explained he will go into more detail with her.  Pt verbalizes understanding.

## 2018-10-05 NOTE — Assessment & Plan Note (Signed)
Mild. Insurance did not cover prolia. Avoid oral bisphosphonate in GI history. Discussed reclast, risk of atypical hip fractures, need to undergo dental work prior to starting infusion. She will see dentist as she has planned dental work pending, then notify us when ready to proceed with reclast. Pt agrees with plan.

## 2018-10-05 NOTE — Patient Instructions (Addendum)
For R hip pain - MRI showed right gluteus muscle strain. Return to physical therapy, use heating pad covered in a towel for 10-15 min a few times a day to buttock area, try muscle relaxant, tramadol refilled.  For osteoporosis - I recommend dental work first, then let us know when completed and we can order reclast medicine for bone strength.

## 2018-10-05 NOTE — Progress Notes (Signed)
BP 120/76 (BP Location: Left Arm, Patient Position: Sitting, Cuff Size: Normal)   Pulse (!) 109   Temp 97.9 F (36.6 C) (Oral)   Ht 5\' 2"  (1.575 m)   Wt 129 lb 12 oz (58.9 kg)   LMP 04/23/2015 (Approximate)   SpO2 98%   BMI 23.73 kg/m    CC: hip pain Subjective:    Patient ID: Robin Welch, female    DOB: May 21, 1966, 53 y.o.   MRN: [id number]  HPI: Julie-Ann Vanmaanen is a 53 y.o. female presenting on 10/05/2018 for Hip Pain (C/o right hip pain for about 8 wks. Says it feels like it slips in/out of place. Seen in our office on 09/06/18 for same sxs. Treated with oxycodone, takes edge off. )    See recent phone notes and prior note for details.  Seen by Dr Einar Pheasant then [facility] then ortho with R hip pain after fall 07/2018 onto L side with L rib pain. 1 wk later R hip pain started - states hip pops in and out of place. Constant R hip pain poist to groin region contsant pain, better with rest, worst with ambulation. She had reassuring xray and MRI - told possible bruising form fall. Referred to PT but this worsened pain. She is now using walker - too much pain with cane due to bearing weight on right leg. She has been out of work due to pain.   Pain medicine takes the edge off but pain persists.   MRI from 09/10/2018 reviewed - R gluteus minimus strain otherwise stable.   Osteoporosis - insurance did not cover prolia - first must try reclast. Pending setting up at short stay.       Relevant past medical, surgical, family and social history reviewed and updated as indicated. Interim medical history since our last visit reviewed. Allergies and medications reviewed and updated. Outpatient Medications Prior to Visit  Medication Sig Dispense Refill  . acetaminophen (TYLENOL) 500 MG tablet Take 1,000 mg by mouth every 6 (six) hours as needed for mild pain or moderate pain.    Marland Kitchen albuterol (VENTOLIN HFA) 108 (90 Base) MCG/ACT inhaler Inhale 1-2 puffs into the lungs every 6 (six) hours as needed for wheezing or  shortness of breath. 18 g 3  . busPIRone (BUSPAR) 30 MG tablet Take 30 mg by mouth 2 (two) times daily.    . Calcium 500 MG CHEW Chew 1 tablet (500 mg total) by mouth 2 (two) times daily.    Marland Kitchen CARAFATE 1 GM/10ML suspension Take 10 mLs (1 g total) by mouth 4 (four) times daily. 420 mL 1  . Cholecalciferol (VITAMIN D) 2000 units CAPS Take 1 capsule (2,000 Units total) by mouth daily. 30 capsule   . gabapentin (NEURONTIN) 300 MG capsule Take 2 capsules (600 mg total) by mouth 2 (two) times daily. 135 capsule 3  . gabapentin (NEURONTIN) 800 MG tablet Take 800 mg by mouth 3 (three) times daily.    . hydrOXYzine (ATARAX/VISTARIL) 25 MG tablet Take 2 tablets (50 mg total) by mouth 3 (three) times daily. 120 tablet 5  . OLANZapine (ZYPREXA) 15 MG tablet Take 1 tablet (15 mg total) by mouth at bedtime. 90 tablet 1  . pantoprazole (PROTONIX) 40 MG tablet Take 1 tablet (40 mg total) by mouth 2 (two) times daily before a meal. 60 tablet 11  . QUEtiapine (SEROQUEL) 400 MG tablet Take 2 tablets (800 mg total) by mouth at bedtime. 60 tablet 0  . SF 5000 PLUS 1.1 %  CREA dental cream Place 1 application onto teeth at bedtime.   2  . Tiotropium Bromide Monohydrate (SPIRIVA RESPIMAT) 2.5 MCG/ACT AERS Inhale 1 puff into the lungs daily. 4 g 6  . VENTOLIN HFA 108 (90 Base) MCG/ACT inhaler INHALE TWO puffs into THE lungs EVERY SIX HOURS AS NEEDED wheezind OR SHORTNESS OF BREATH 18 g 3  . ziprasidone (GEODON) 80 MG capsule Take 1 capsule (80 mg total) by mouth daily. 30 capsule 5  . ondansetron (ZOFRAN-ODT) 4 MG disintegrating tablet Take 1 tablet (4 mg total) by mouth every 8 (eight) hours as needed for nausea or vomiting. 20 tablet 2  . oxyCODONE (OXY IR/ROXICODONE) 5 MG immediate release tablet Take 1 tablet (5 mg total) by mouth every 4 (four) hours as needed for severe pain. 10 tablet 0  . traMADol (ULTRAM) 50 MG tablet Take 1 tablet (50 mg total) by mouth every 8 (eight) hours as needed. 15 tablet 0   No  facility-administered medications prior to visit.      Per HPI unless specifically indicated in ROS section below Review of Systems Objective:    BP 120/76 (BP Location: Left Arm, Patient Position: Sitting, Cuff Size: Normal)   Pulse (!) 109   Temp 97.9 F (36.6 C) (Oral)   Ht 5\' 2"  (1.575 m)   Wt 129 lb 12 oz (58.9 kg)   LMP 04/23/2015 (Approximate)   SpO2 98%   BMI 23.73 kg/m   Wt Readings from Last 3 Encounters:  10/05/18 129 lb 12 oz (58.9 kg)  09/06/18 138 lb 8 oz (62.8 kg)  08/09/18 129 lb 8 oz (58.7 kg)    Physical Exam Vitals signs and nursing note reviewed.  Constitutional:      Appearance: Normal appearance. She is not ill-appearing.     Comments: Walks with walker  Musculoskeletal: Normal range of motion.        General: Tenderness present.     Right lower leg: No edema.     Left lower leg: No edema.     Comments: No pain midline spine No paraspinous mm tenderness Neg SLR bilaterally. Mild discomfort pain with int/ext rotation at hip. Neg FABER. No pain at SIJ, GTB or sciatic notch bilaterally.  Tender to palpation R upper buttock area at site of gluteus minimus Reproducible tenderness to hip abduction and internal rotation on right  Skin:    General: Skin is warm and dry.     Findings: No erythema or rash.  Neurological:     Mental Status: She is alert.       Results for orders placed or performed in visit on 07/13/18  Comprehensive metabolic panel  Result Value Ref Range   Sodium 130 (L) 135 - 145 mEq/L   Potassium 3.8 3.5 - 5.1 mEq/L   Chloride 95 (L) 96 - 112 mEq/L   CO2 28 19 - 32 mEq/L   Glucose, Bld 153 (H) 70 - 99 mg/dL   BUN 4 (L) 6 - 23 mg/dL   Creatinine, Ser 0.68 0.40 - 1.20 mg/dL   Total Bilirubin 0.3 0.2 - 1.2 mg/dL   Alkaline Phosphatase 122 (H) 39 - 117 U/L   AST 12 0 - 37 U/L   ALT 11 0 - 35 U/L   Total Protein 6.6 6.0 - 8.3 g/dL   Albumin 3.7 3.5 - 5.2 g/dL   Calcium 8.4 8.4 - 10.5 mg/dL   GFR 95.53 >60.00 mL/min    Assessment & Plan:   Problem List Items  Addressed This Visit    Osteoporosis    Mild. Insurance did not cover prolia. Avoid oral bisphosphonate in GI history. Discussed reclast, risk of atypical hip fractures, need to undergo dental work prior to starting infusion. She will see dentist as she has planned dental work pending, then notify us when ready to proceed with reclast. Pt agrees with plan.       Muscle strain of gluteal region, right, subsequent encounter - Primary    Reviewed MRI results with patient showing R gluteus minimus muscle strain. Anticipate this is cause of pain/symptoms, but slow to improve. Recommended return to PT as well as treat with robaxin muscle relaxant and heating pad. Will refill tramadol x 1 more acute course. Pt agrees with plan.           Meds ordered this encounter  Medications  . traMADol (ULTRAM) 50 MG tablet    Sig: Take 1 tablet (50 mg total) by mouth every 8 (eight) hours as needed.    Dispense:  15 tablet    Refill:  0  . methocarbamol (ROBAXIN) 500 MG tablet    Sig: Take 1-2 tablets (500-1,000 mg total) by mouth 3 (three) times daily as needed for muscle spasms.    Dispense:  30 tablet    Refill:  0   No orders of the defined types were placed in this encounter.   Follow up plan: No follow-ups on file.  Ria Bush, MD

## 2018-10-05 NOTE — Assessment & Plan Note (Signed)
Reviewed MRI results with patient showing R gluteus minimus muscle strain. Anticipate this is cause of pain/symptoms, but slow to improve. Recommended return to PT as well as treat with robaxin muscle relaxant and heating pad. Will refill tramadol x 1 more acute course. Pt agrees with plan.

## 2018-10-16 ENCOUNTER — Telehealth: Payer: Self-pay | Admitting: Family Medicine

## 2018-10-16 NOTE — Telephone Encounter (Signed)
plz call and schedule wellness visit/physical as she's overdue.

## 2018-10-18 DIAGNOSIS — M25651 Stiffness of right hip, not elsewhere classified: Secondary | ICD-10-CM | POA: Diagnosis not present

## 2018-10-18 DIAGNOSIS — M6281 Muscle weakness (generalized): Secondary | ICD-10-CM | POA: Diagnosis not present

## 2018-10-18 DIAGNOSIS — R262 Difficulty in walking, not elsewhere classified: Secondary | ICD-10-CM | POA: Diagnosis not present

## 2018-10-18 DIAGNOSIS — M25551 Pain in right hip: Secondary | ICD-10-CM | POA: Diagnosis not present

## 2018-10-18 NOTE — Telephone Encounter (Signed)
Noted.  Fyi to Dr. G.  

## 2018-10-18 NOTE — Telephone Encounter (Signed)
Spoke with pt she declined to make appointment.  She stated she would call back to schedule.  I offered to schedule something in July or late pt declined

## 2018-10-24 ENCOUNTER — Telehealth: Payer: Self-pay | Admitting: Psychiatry

## 2018-10-24 NOTE — Telephone Encounter (Signed)
Patient called and said that the gabapentin is giving her extreme memory loss. She wants to switch to a different medicine. Please give her a call at [phone]

## 2018-10-25 ENCOUNTER — Other Ambulatory Visit: Payer: Self-pay

## 2018-10-25 MED ORDER — GABAPENTIN 300 MG PO CAPS: ORAL_CAPSULE | ORAL | 1 refills | Status: DC

## 2018-10-25 NOTE — Telephone Encounter (Signed)
Pt called has appt 11/01/2018. Stated she needs sooner appt., like tomorrow. You don't have a 30 min avaliable. She then ask you to return her call. [phone]

## 2018-10-25 NOTE — Telephone Encounter (Signed)
Says she's taking gabapentin 800mg  bid-tid daily

## 2018-10-25 NOTE — Telephone Encounter (Signed)
Pt aware and rx submitted as directed

## 2018-10-26 ENCOUNTER — Other Ambulatory Visit: Payer: Self-pay | Admitting: Psychiatry

## 2018-10-26 DIAGNOSIS — F319 Bipolar disorder, unspecified: Secondary | ICD-10-CM

## 2018-10-27 NOTE — Telephone Encounter (Signed)
Refill ok? Has office visit 03/03 Last office visit 05/2018

## 2018-11-01 ENCOUNTER — Ambulatory Visit: Payer: Medicare HMO | Admitting: Psychiatry

## 2018-11-15 ENCOUNTER — Telehealth: Payer: Self-pay

## 2018-11-15 ENCOUNTER — Other Ambulatory Visit: Payer: Self-pay

## 2018-11-15 ENCOUNTER — Ambulatory Visit (INDEPENDENT_AMBULATORY_CARE_PROVIDER_SITE_OTHER): Payer: Medicare HMO | Admitting: Psychiatry

## 2018-11-15 ENCOUNTER — Encounter: Payer: Self-pay | Admitting: Psychiatry

## 2018-11-15 VITALS — BP 104/75 | HR 113

## 2018-11-15 DIAGNOSIS — F5105 Insomnia due to other mental disorder: Secondary | ICD-10-CM

## 2018-11-15 DIAGNOSIS — F319 Bipolar disorder, unspecified: Secondary | ICD-10-CM | POA: Diagnosis not present

## 2018-11-15 DIAGNOSIS — F411 Generalized anxiety disorder: Secondary | ICD-10-CM | POA: Diagnosis not present

## 2018-11-15 DIAGNOSIS — F3130 Bipolar disorder, current episode depressed, mild or moderate severity, unspecified: Secondary | ICD-10-CM

## 2018-11-15 MED ORDER — ALPRAZOLAM 0.5 MG PO TABS: 0.5000 mg | ORAL_TABLET | Freq: Every day | ORAL | 1 refills | Status: DC | PRN

## 2018-11-15 MED ORDER — BUSPIRONE HCL 30 MG PO TABS: 30.0000 mg | ORAL_TABLET | Freq: Two times a day (BID) | ORAL | 0 refills | Status: DC

## 2018-11-15 MED ORDER — GABAPENTIN 400 MG PO CAPS: 400.0000 mg | ORAL_CAPSULE | Freq: Three times a day (TID) | ORAL | 0 refills | Status: DC

## 2018-11-15 MED ORDER — QUETIAPINE FUMARATE 400 MG PO TABS: 800.0000 mg | ORAL_TABLET | Freq: Every day | ORAL | 1 refills | Status: DC

## 2018-11-15 MED ORDER — ZIPRASIDONE HCL 80 MG PO CAPS: 80.0000 mg | ORAL_CAPSULE | Freq: Every day | ORAL | 2 refills | Status: DC

## 2018-11-15 MED ORDER — PROPRANOLOL HCL 10 MG PO TABS: ORAL_TABLET | ORAL | 1 refills | Status: DC

## 2018-11-15 MED ORDER — OLANZAPINE 15 MG PO TABS: 15.0000 mg | ORAL_TABLET | Freq: Every day | ORAL | 1 refills | Status: DC
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
## 2018-11-15 NOTE — Telephone Encounter (Signed)
Patient called wanting advise. Patient went to her doctor's appointment today and they checked her temp which was 100 at first and then it was 99.5. She does not feel bad. She was not seen and was advised to call us. On the way home she had severe episode of diarrhea all over herself. She does not have any other symptoms. No cough or dyspnea, no travel history or known exposure. Patient did go to [employer] last weekend. Patient advised I would check with Dr. Danise Mina on how to proceed. CB: [phone]

## 2018-11-16 ENCOUNTER — Telehealth: Payer: Self-pay

## 2018-11-16 NOTE — Telephone Encounter (Signed)
[place] Night - Client TELEPHONE ADVICE RECORD [facility] Medical Call Center Patient Name: Robin Welch Gender: Female DOB: Mar 09, 1966 Age: 53 Y 2 M 13 D Return Phone Number: [phone] (Primary) Address: [address] City/State/Zip: [place] [address] Client [place] Night - Client Client Site [place] Physician Ria Bush - MD Contact Type Call Who Is Calling Patient / Member / Family / Caregiver Call Type Triage / Clinical Relationship To Patient Self Return Phone Number [phone] (Primary) Chief Complaint Fever (non urgent symptom) (> THREE MONTHS) Reason for Call Symptomatic / Request for [place] states she has a fever 100 (forehead) and diarrhea. Translation No Nurse Assessment Nurse: Earnie Larsson, RN, Joy Date/Time (Eastern Time): 11/15/2018 5:08:34 PM Confirm and document reason for call. If symptomatic, describe symptoms. ---Current temp 98.6 under the tongue. Diarrhea one time earlier. Feels totally fine now. Has the patient traveled to [place], [place], [place], [place], or [place] OR had close contact with a person known to have the novel coronavirus illness in the last 14 days? ---No Does the patient have any new or worsening symptoms? ---Yes Will a triage be completed? ---Yes Related visit to physician within the last 2 weeks? ---No Does the PT have any chronic conditions? (i.e. diabetes, asthma, this includes High risk factors for pregnancy, etc.) ---Yes List chronic conditions. ---Asthma, COPD Is this a behavioral health or substance abuse call? ---No Guidelines Guideline Title Affirmed Question Affirmed Notes Nurse Date/Time (Eastern Time) Diarrhea MILD-MODERATE diarrhea (e.g., 1-6 times / day more than normal) Earnie Larsson, RN, Joy 11/15/2018 5:10:24 PM Disp. Time Eilene Ghazi Time) Disposition Final User 11/15/2018 5:13:55 Roper, RN, Joy PLEASE NOTE: All timestamps contained within this report are represented as [place] Standard Time. CONFIDENTIALTY NOTICE: This fax transmission is intended only for the addressee. It contains information that is legally privileged, confidential or otherwise protected from use or disclosure. If you are not the intended recipient, you are strictly prohibited from reviewing, disclosing, copying using or disseminating any of this information or taking any action in reliance on or regarding this information. If you have received this fax in error, please notify us immediately by telephone so that we can arrange for its return to [place]. Phone: [phone], Toll-Free: [phone], Fax: [phone] Page: 2 of 2 Call Id: [id number] [place] Disagree/Comply Comply Caller Understands Yes PreDisposition Call a family member Care Advice Given Per Guideline HOME CARE: * You should be able to treat this at home. * Sometimes the cause is an infection caused by a virus ('stomach flu) or a bacteria. Diarrhea is one of the body's way of getting rid of germs. REASSURANCE AND EDUCATION: FLUID THERAPY DURING MILD-MODERATE DIARRHEA: * Drink more fluids, at least 8-10 cups daily. One cup equals 8 oz (240 ml). * WATER: For mild to moderate diarrhea, water is often the best liquid to drink. You should also eat some salty foods (e.g., potato chips, pretzels, saltine crackers). This is important to make sure you are getting enough salt, sugars, and fluids to meet your body's needs. * SPORTS DRINKS: You can also drink a sports drinks (e.g., Gatorade, Powerade) to help treat and prevent dehydration. For it to work best, mix it half and half with water. * Avoid caffeinated beverages (Reason: caffeine is mildly dehydrating). * Avoid alcohol beverages (beer, wine, hard liquor). * Maintaining some food intake during episodes of diarrhea is important. FOOD AND NUTRITION DURING MILD-MODERATE  DIARRHEA * Begin with boiled  starches / cereals (e.g., potatoes, rice, noodles, wheat, oats) with a small amount of salt to taste. * Other foods that are OK include: bananas, yogurt, crackers, soup. * As the diarrhea starts to get better, you can slowly return to a normal diet. DIARRHEA MEDICINE - Loperamide (Imodium AD): * Adult dosage: 4 mg (2 capsules) is the recommended first dose. You may take an additional 2 mg (1 capsule) after each loose BM. CALL BACK IF: * Signs of dehydration occur (e.g., no urine over 12 hours, very dry mouth, lightheaded, etc.) * Diarrhea lasts over 7 days * You become worse. Comments User: Agustina Caroli, RN Date/Time Eilene Ghazi Time): 11/15/2018 5:11:18 PM Last urine a few minute ago

## 2018-11-16 NOTE — Telephone Encounter (Signed)
Spoke with pt relaying Dr. Synthia Innocent message and instructions. Pt verbalizes understanding and states she feels a lot better today. She is not having any sxs.  Fyi to Dr. Darnell Level.

## 2018-11-16 NOTE — Telephone Encounter (Signed)
This does not sound like coronavirus. However possible GI bug - would monitor symptoms at this time, encourage good fluids to stay well hydrated, bland diet, can offer office visit if starts feeling worse for evaluation.  Keep an eye on temperature, let us know if fever >101 or developing new cough/shortness of breath.

## 2018-11-16 NOTE — Telephone Encounter (Signed)
I spoke with pt and pt said she does not have fever or diarrhea this morning.pt said she has no symptoms at all. Pt will cb if needed. FYI to Dr Darnell Level.

## 2018-11-21 ENCOUNTER — Telehealth: Payer: Self-pay | Admitting: Psychiatry

## 2018-11-21 NOTE — Telephone Encounter (Signed)
Patient left vm stated that she needs a refill on her Xanax 5 mg., was taking 2 2x a day can a script be re-written or if the Xanax can be increased

## 2018-11-21 NOTE — Telephone Encounter (Signed)
Pt called requested refill on Xanax 10mg   2/d.

## 2018-11-22 NOTE — Telephone Encounter (Signed)
Pt given information and verbalized understanding of instructions.

## 2018-11-24 ENCOUNTER — Other Ambulatory Visit: Payer: Self-pay | Admitting: Psychiatry

## 2018-11-29 ENCOUNTER — Telehealth: Payer: Self-pay | Admitting: Psychiatry

## 2018-11-29 NOTE — Telephone Encounter (Signed)
Patient called and said that she has stopped taking the proponol because she was having nightmares. She is also stopped taking the gabapentin. However she would like to increase the xanax to 10 mg. She is on 5 mg. Please escribe to [facility]. Next appt 4/22

## 2018-11-29 NOTE — Telephone Encounter (Signed)
I had went over last week with her that the Xanax would not be increased, she did verbalize understanding.

## 2018-11-30 ENCOUNTER — Telehealth: Payer: Self-pay

## 2018-11-30 NOTE — Telephone Encounter (Signed)
[place] Night - Client Nonclinical Telephone Record [facility] Night - Client Client Site [place] - Night Contact Type Call Who Is Calling Patient / Member / Family / Caregiver Caller Name [place] Phone Number [phone] Patient Name Robin Welch Patient DOB 01/29/1966 Call Type Message Only Information Provided Reason for Call Request for General Office Information Initial Comment Caller has mgs in chart and cant open it wants to know what it is. Additional Comment Call Closed By: Lenox Ponds Transaction Date/Time: 11/29/2018 5:16:02 PM (ET)

## 2018-11-30 NOTE — Telephone Encounter (Signed)
Contacted patient regarding concern about patient messages. Patient states this issue has been resolved.

## 2018-11-30 NOTE — Telephone Encounter (Signed)
[place] Night - Client Nonclinical Telephone Record [facility] [facility] Night - Client Client Site [place] - Night Contact Type Call Who Is Calling Patient / Member / Family / Caregiver Caller Name [place] Phone Number [phone] Patient Name Robin Welch Patient DOB [birth year] Call Type Message Only Information Provided Reason for Call Request for General Office Information Initial Comment Caller has mgs in chart and cant open it wants to know what it is. Additional Comment Call Closed By: Lenox Ponds Transaction Date/Time: 11/29/2018 5:16:02 PM (ET)

## 2018-12-05 ENCOUNTER — Telehealth: Payer: Self-pay

## 2018-12-05 DIAGNOSIS — K920 Hematemesis: Secondary | ICD-10-CM

## 2018-12-05 DIAGNOSIS — R112 Nausea with vomiting, unspecified: Secondary | ICD-10-CM

## 2018-12-05 NOTE — Telephone Encounter (Signed)
Pt said that for a while pt has had vomiting after eating especially eating something red like ketchup.now for a while when pt eats she has burping and burps until she vomits. After food comes up pt vomits 1/2 c of bright red blood. Pt has been taking carafate and pantoprazole. No fever, cough,SOB,CP or Abd pain. Pt to go to ED. Pt voiced understanding and will take into consideration what Dr Darnell Level said and pt said she would go to [facility] ED.FYI to Dr Darnell Level.

## 2018-12-07 NOTE — Telephone Encounter (Signed)
I don't see where patient was seen at local ER - plz call for update on symptoms. Needs evaluation - would offer virtual visit. If ongoing vomiting blood, again would recommend ER eval and will need to return to GI. Let me know if she needs referral.

## 2018-12-07 NOTE — Telephone Encounter (Signed)
Left message on vm for pt to call back.   Need update on sxs, per Dr. Darnell Level.  If still vomiting blood, go to ER!  If not, but still feeling bad and having other sxs, pt needs eval with her GI or virtual visit with Dr. Darnell Level.

## 2018-12-08 ENCOUNTER — Telehealth: Payer: Self-pay | Admitting: Psychiatry

## 2018-12-08 NOTE — Telephone Encounter (Signed)
Left message on vm for pt to call back.   Need update on sxs, per Dr. Darnell Level.  If still vomiting blood, go to ER!  If not, but still feeling bad and having other sxs, pt needs eval with her GI or virtual visit with Dr. Darnell Level.

## 2018-12-08 NOTE — Telephone Encounter (Signed)
Patient need refill on Ziprasidone (GEODON) 80 mg.,cap to be sent to [facility].  Patient is currently out of her medication

## 2018-12-08 NOTE — Telephone Encounter (Signed)
Spoke to pharmacist and he said she picked it up 11/16/2018 and has 2 refills left, it's too soon and she shouldn't be out if taking 1 daily.  Can you check with her Angie? Thank you

## 2018-12-08 NOTE — Telephone Encounter (Signed)
Refill has been submitted with refills in March, will contact [[facility] to check

## 2018-12-12 ENCOUNTER — Other Ambulatory Visit: Payer: Self-pay | Admitting: Psychiatry

## 2018-12-12 ENCOUNTER — Other Ambulatory Visit: Payer: Self-pay | Admitting: Family Medicine

## 2018-12-12 ENCOUNTER — Telehealth: Payer: Self-pay

## 2018-12-12 DIAGNOSIS — F411 Generalized anxiety disorder: Secondary | ICD-10-CM

## 2018-12-12 MED ORDER — POLYMYXIN B-TRIMETHOPRIM 10000-0.1 UNIT/ML-% OP SOLN: 1.0000 [drp] | Freq: Four times a day (QID) | OPHTHALMIC | 0 refills | Status: DC

## 2018-12-12 NOTE — Telephone Encounter (Signed)
Eye drops sent to pharmacy.  Use warm compresses to eyes as well.  Will need eval if not improving with this.

## 2018-12-12 NOTE — Addendum Note (Signed)
Addended by: Ria Bush on: 12/12/2018 12:01 PM   Modules accepted: Orders

## 2018-12-12 NOTE — Telephone Encounter (Signed)
Spoke with pt asking how she is doing.  Says she is a lot better. Has not seen any blood in several days. Seems to be the same issue when she had to have upper GI procedure.  States sxs are controlled as long as she takes sucralfate regularly.

## 2018-12-12 NOTE — Telephone Encounter (Signed)
Pt spoke with Lattie Haw earlier and forgot to mention; pt had a stye on lt eye and she squeezed the stye and infection went into her lt eye. Now both eyes are not red but itching. Pt thinks she has carried the infection from the lt eye to the rt eye. Pt request abx eye drops; the eye doctor is not in the office.no fever. [place]; [place].

## 2018-12-12 NOTE — Telephone Encounter (Signed)
Still recommend GI re evaluation for ongoing hematemesis. New referral placed.

## 2018-12-12 NOTE — Telephone Encounter (Signed)
Called [facility] and placed Referral on [facility]. They will triage the call and call the patient directly.

## 2018-12-13 ENCOUNTER — Telehealth: Payer: Self-pay

## 2018-12-13 NOTE — Telephone Encounter (Signed)
Refill submitted for gabapentin but propranolol and xanax has refill on file for April.

## 2018-12-13 NOTE — Telephone Encounter (Signed)
Spoke with pt relaying Dr. G's message. Pt verbalizes understanding.  

## 2018-12-15 ENCOUNTER — Telehealth: Payer: Self-pay | Admitting: Gastroenterology

## 2018-12-15 NOTE — Telephone Encounter (Signed)
Patient reports that at least one time a day she is vomiting blood at least 1/2 cup a day.  She has been doing this for the last several days.  She was advised by her PCP that she needs to go to the ED, she does not want to go.  She called here for advice.  She is again advised that she needs to go to the ED for eval.  She verbalized understanding.

## 2018-12-16 ENCOUNTER — Encounter: Payer: Self-pay | Admitting: *Deleted

## 2018-12-16 ENCOUNTER — Other Ambulatory Visit: Payer: Self-pay | Admitting: Psychiatry

## 2018-12-16 DIAGNOSIS — F5105 Insomnia due to other mental disorder: Secondary | ICD-10-CM

## 2018-12-16 DIAGNOSIS — F3130 Bipolar disorder, current episode depressed, mild or moderate severity, unspecified: Secondary | ICD-10-CM

## 2018-12-19 ENCOUNTER — Ambulatory Visit (INDEPENDENT_AMBULATORY_CARE_PROVIDER_SITE_OTHER): Payer: Medicare HMO | Admitting: Gastroenterology

## 2018-12-19 ENCOUNTER — Other Ambulatory Visit (INDEPENDENT_AMBULATORY_CARE_PROVIDER_SITE_OTHER): Payer: Medicare HMO

## 2018-12-19 ENCOUNTER — Other Ambulatory Visit: Payer: Self-pay

## 2018-12-19 ENCOUNTER — Telehealth: Payer: Self-pay | Admitting: Gastroenterology

## 2018-12-19 ENCOUNTER — Encounter: Payer: Self-pay | Admitting: Gastroenterology

## 2018-12-19 VITALS — BP 120/76 | HR 109 | Ht 62.0 in | Wt 129.0 lb

## 2018-12-19 DIAGNOSIS — K2211 Ulcer of esophagus with bleeding: Secondary | ICD-10-CM

## 2018-12-19 DIAGNOSIS — R112 Nausea with vomiting, unspecified: Secondary | ICD-10-CM

## 2018-12-19 DIAGNOSIS — K219 Gastro-esophageal reflux disease without esophagitis: Secondary | ICD-10-CM

## 2018-12-19 LAB — BASIC METABOLIC PANEL
BUN: 3 mg/dL — ABNORMAL LOW (ref 6–23)
CO2: 30 mEq/L (ref 19–32)
Calcium: 8.4 mg/dL (ref 8.4–10.5)
Chloride: 86 mEq/L — ABNORMAL LOW (ref 96–112)
Creatinine, Ser: 0.67 mg/dL (ref 0.40–1.20)
GFR: 91.28 mL/min (ref >60.00)
Glucose, Bld: 122 mg/dL — ABNORMAL HIGH (ref 70–99)
Potassium: 3.5 mEq/L (ref 3.5–5.1)
Sodium: 124 mEq/L — ABNORMAL LOW (ref 135–145)

## 2018-12-19 LAB — CBC WITH DIFFERENTIAL/PLATELET
Basophils Absolute: 0.1 10*3/uL (ref 0.0–0.1)
Basophils Relative: 1.1 % (ref 0.0–3.0)
Eosinophils Absolute: 0.2 10*3/uL (ref 0.0–0.7)
Eosinophils Relative: 2.8 % (ref 0.0–5.0)
HCT: 36 % (ref 36.0–46.0)
Hemoglobin: 12.1 g/dL (ref 12.0–15.0)
Lymphocytes Relative: 29.7 % (ref 12.0–46.0)
Lymphs Abs: 2.4 10*3/uL (ref 0.7–4.0)
MCHC: 33.6 g/dL (ref 30.0–36.0)
MCV: 86.7 fl (ref 78.0–100.0)
Monocytes Absolute: 0.6 10*3/uL (ref 0.1–1.0)
Monocytes Relative: 7.9 % (ref 3.0–12.0)
Neutro Abs: 4.6 10*3/uL (ref 1.4–7.7)
Neutrophils Relative %: 58.5 % (ref 43.0–77.0)
Platelets: 578 10*3/uL — ABNORMAL HIGH (ref 150.0–400.0)
RBC: 4.16 Mil/uL (ref 3.87–5.11)
RDW: 13.5 % (ref 11.5–15.5)
WBC: 7.9 10*3/uL (ref 4.0–10.5)

## 2018-12-19 LAB — FOLATE: Folate: 14.9 ng/mL (ref >5.9)

## 2018-12-19 LAB — IBC + FERRITIN
Ferritin: 64.8 ng/mL (ref 10.0–291.0)
Iron: 47 ug/dL (ref 42–145)
Saturation Ratios: 13.1 % — ABNORMAL LOW (ref 20.0–50.0)
Transferrin: 257 mg/dL (ref 212.0–360.0)

## 2018-12-19 LAB — VITAMIN B12: Vitamin B-12: 369 pg/mL (ref 211–911)

## 2018-12-19 MED ORDER — ONDANSETRON 4 MG PO TBDP: 4.0000 mg | ORAL_TABLET | Freq: Every day | ORAL | 0 refills | Status: DC | PRN

## 2018-12-19 MED ORDER — SUCRALFATE 1 GM/10ML PO SUSP: ORAL | 3 refills | Status: DC

## 2018-12-19 MED ORDER — DEXLANSOPRAZOLE 60 MG PO CPDR: 60.0000 mg | DELAYED_RELEASE_CAPSULE | ORAL | 3 refills | Status: DC

## 2018-12-19 NOTE — Progress Notes (Signed)
Robin Welch    [id number]    07-01-1966  Primary Care Physician:Gutierrez, Garlon Hatchet, MD  Referring Physician: Ria Bush, MD [place], [place] [id number]  This service was provided via audio and video telemedicine (Doximity) due to [place] 19 pandemic.  Patient location: Home Provider location: Office Used 2 patient identifiers to confirm the correct person. Explained the limitations in evaluation and management via telemedicine. Patient is aware of potential medical charges for this visit.  Patient consented to this virtual visit.  The persons participating in this telemedicine service were myself and the patient   Chief complaint: Regurgitation, nausea, heartburn  HPI:  53 year old female with chronic GERD, severe erosive esophagitis with complaints of persistent heartburn, retrosternal discomfort, regurgitation and vomiting.  She feels her symptoms are getting progressively worse.  EGD April 28, 2017 showed severe LA grade D esophagitis and long segment Barrett's esophagus    Outpatient Encounter Medications as of 12/19/2018  Medication Sig  . acetaminophen (TYLENOL) 500 MG tablet Take 1,000 mg by mouth every 6 (six) hours as needed for mild pain or moderate pain.  Marland Kitchen albuterol (VENTOLIN HFA) 108 (90 Base) MCG/ACT inhaler Inhale 1-2 puffs into the lungs every 6 (six) hours as needed for wheezing or shortness of breath.  . ALPRAZolam (XANAX) 0.5 MG tablet Take 1 tablet (0.5 mg total) by mouth daily as needed for anxiety.  . busPIRone (BUSPAR) 30 MG tablet Take 1 tablet (30 mg total) by mouth 2 (two) times daily.  . Calcium 500 MG CHEW Chew 1 tablet (500 mg total) by mouth 2 (two) times daily.  Marland Kitchen CARAFATE 1 GM/10ML suspension Take 10 mLs (1 g total) by mouth 4 (four) times daily.  . Cholecalciferol (VITAMIN D) 2000 units CAPS Take 1 capsule (2,000 Units total) by mouth daily.  . hydrOXYzine (ATARAX/VISTARIL) 25 MG tablet Take 2 tablets (50 mg  total) by mouth 3 (three) times daily.  . methocarbamol (ROBAXIN) 500 MG tablet Take 1-2 tablets (500-1,000 mg total) by mouth 3 (three) times daily as needed for muscle spasms.  . naproxen sodium (ALEVE) 220 MG tablet Take 220 mg by mouth daily as needed.   Marland Kitchen OLANZapine (ZYPREXA) 15 MG tablet Take 1 tablet (15 mg total) by mouth at bedtime.  . pantoprazole (PROTONIX) 40 MG tablet Take 1 tablet (40 mg total) by mouth 2 (two) times daily before a meal.  . propranolol (INDERAL) 10 MG tablet TAKE 1 OR 2 TABLETS BY MOUTH TWICE DAILY  . QUEtiapine (SEROQUEL) 400 MG tablet Take 2 tablets (800 mg total) by mouth at bedtime.  . SF 5000 PLUS 1.1 % CREA dental cream Place 1 application onto teeth at bedtime.   . Tiotropium Bromide Monohydrate (SPIRIVA RESPIMAT) 2.5 MCG/ACT AERS Inhale 1 puff into the lungs daily.  Marland Kitchen trimethoprim-polymyxin b (POLYTRIM) ophthalmic solution Place 1 drop into both eyes every 6 (six) hours.  . VENTOLIN HFA 108 (90 Base) MCG/ACT inhaler INHALE TWO puffs into THE lungs EVERY SIX HOURS AS NEEDED wheezind OR SHORTNESS OF BREATH  . ziprasidone (GEODON) 80 MG capsule Take 1 capsule (80 mg total) by mouth daily for 30 days.   No facility-administered encounter medications on file as of 12/19/2018.     Allergies as of 12/19/2018 - Review Complete 12/19/2018  Allergen Reaction Noted  . Prozac [fluoxetine hcl] Swelling and Rash 12/18/2010  . Doxycycline Nausea And Vomiting 10/31/2014  . Celexa [citalopram hydrobromide] Other (See Comments) 05/06/2018  .  Chantix [varenicline tartrate] Swelling 03/28/2018  . Hydroxyzine  05/06/2018  . Klonopin [clonazepam]  05/06/2018  . Lunesta [eszopiclone]  05/06/2018  . Sonata [zaleplon]  05/06/2018  . Trazodone and nefazodone  05/06/2018  . Zolpimist [zolpidem tartrate]  05/06/2018  . Azithromycin  04/03/2015    Past Medical History:  Diagnosis Date  . Anxiety   . Barrett esophagus   . Bipolar 1 disorder [facility])    sees Dr. Candis Schatz  psych [phone])  . COPD (chronic obstructive pulmonary disease) ([place]) 03/2013   hyperinflation by CXR  . History of cocaine dependence ([place]) latest 2017   undergoing detox  . History of stomach ulcers   . Hx: UTI (urinary tract infection)   . Migraine    "stopped in ~ 2008" (03/11/2017)  . Osteoporosis 06/09/2018   DEXA 05/2017 - T score -2.5 L hip, -1.3 spine  . Osteoporosis   . Positive PPD 1989   s/p CXR and INH 6 mo  . Seizure ([place]) X 1   "from takiing too many headache medicine?" (03/11/2017)  . Smoker     Past Surgical History:  Procedure Laterality Date  . COLONOSCOPY  07/2018   poor colon prep - rec repeat ([facility]gan)  . DILATION AND CURETTAGE OF UTERUS  X 1   S/P miscarriage  . ESOPHAGOGASTRODUODENOSCOPY  03/2017   candidal esophagitis, treated, LA grade D esophagitis, 4cm HH (Selvin Yun)  . ESOPHAGOGASTRODUODENOSCOPY  07/2018   severe reflux esophagitis, neg candida (Jari Dipasquale)  . LAPAROSCOPIC CHOLECYSTECTOMY  2006  . TUBAL LIGATION  2003    Family History  Problem Relation Age of Onset  . Diabetes Mother        T2DM  . Breast cancer Mother 89  . Dementia Mother   . Lymphoma Mother   . Coronary artery disease Father 60       MI  . Anxiety disorder Paternal Aunt   . Anxiety disorder Cousin   . Depression Cousin   . ADD / ADHD Child   . Anxiety disorder Child   . Depression Child   . Stroke Neg Hx   . Colon cancer Neg Hx   . Cancer - Colon Neg Hx     Social History   Socioeconomic History  . Marital status: Divorced    Spouse name: Not on file  . Number of children: 2  . Years of education: bachelors  . Highest education level: Not on file  Occupational History  . 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 Financial resource strain: Not on file  . Food insecurity:    Worry: Not on file    Inability: Not on file  . Transportation needs:    Medical: Not on  file    Non-medical: Not on file  Tobacco Use  . Smoking status: Current Every Day Smoker    Packs/day: 1.00    Years: 36.00    Pack years: 36.00    Types: Cigarettes  . Smokeless tobacco: Never Used  Substance and Sexual Activity  . Alcohol use: Not Currently    Alcohol/week: 0.0 standard drinks    Comment: 03/11/2017 "stopped 09/18/2015; never an alcoholic"  . Drug use: Not Currently    Types: Cocaine    Comment: 03/11/2017 "stopped 09/18/2015"  . Sexual activity: Never    Birth control/protection: Surgical  Lifestyle  . Physical activity:    Days per week: Not on file  Minutes per session: Not on file  . Stress: Not on file  Relationships  . Social connections:    Talks on phone: Not on file    Gets together: Not on file    Attends religious service: Not on file    Active member of club or organization: Not on file    Attends meetings of clubs or organizations: Not on file    Relationship status: Not on file  . Intimate partner violence:    Fear of current or ex partner: Not on file    Emotionally abused: Not on file    Physically abused: Not on file    Forced sexual activity: Not on file  Other Topics Concern  . Not on file  Social History Narrative   Caffeine: 40 oz diet mountain dew/day   Lives with husband and 2 children, no pets         Review of systems: Review of Systems as per HPI All other systems reviewed and are negative.   Physical Exam: Vitals were not taken and physical exam was not performed during this virtual visit.  Data Reviewed:  Reviewed labs, radiology imaging, old records and pertinent past GI work up   Assessment and Plan/Recommendations:  53 year old female with complaints of worsening heartburn, nausea, vomiting and discomfort.  History of severe erosive esophagitis and Barrett's esophagus.  Switch to Dexilant 60 mg daily Carafate suspension before meals and at bedtime  Zofran 4 mg daily for nausea and vomiting  Discussed  antireflux measures and lifestyle modifications in detail  Check CBC, CMP, B12, folate and iron panel  Will need to schedule EGD for further evaluation and document healing of severe erosive esophagitis, will try to schedule it within next few weeks  Follow-up in 2 to 4 weeks    K. Denzil Magnuson , MD   CC: Ria Bush, MD

## 2018-12-19 NOTE — Telephone Encounter (Signed)
Pt just had virtual visit with Dr. Silverio Decamp and forgot to tell her that she thinks that her iron is low, she states that she cannot take iron pills so needs to have injections. Pls call her.

## 2018-12-19 NOTE — Telephone Encounter (Signed)
Spoke with the patient and advised her of this.

## 2018-12-19 NOTE — Telephone Encounter (Signed)
I requested labs including iron panel, based on results will consider IV iron if needed.

## 2018-12-19 NOTE — Patient Instructions (Addendum)
If you are age 55 or older, your body mass index should be between 23-30. Your Body mass index is 23.59 kg/m. If this is out of the aforementioned range listed, please consider follow up with your Primary Care Provider.  If you are age 40 or younger, your body mass index should be between 19-25. Your Body mass index is 23.59 kg/m. If this is out of the aformentioned range listed, please consider follow up with your Primary Care Provider.   We have sent the following medications to your pharmacy for you to pick up at your convenience:  dexilant 60mg  daily, 30 minutes before breakfast X 90 days supply with 3 refills  Carafate 1gm before meals and at bedtime X 90 days supply  Zofran 4 mg ODT once daily as needed X90 tablets  If you have not heard from your mail in pharmacy within 1 week or if you have not received your medication in the mail, please contact us at [phone] so we may find out why.  Your provider has requested that you go to the basement level for lab work. Press "B" on the elevator. The lab is located at the first door on the left as you exit the elevator.   CBC, BMP, B12, folate, iron panel with ferritin  We will plan for EGD at [facility] in 6 to 8 weeks for follow-up of esophageal ulcers.  We will contact you with an appointment regarding procedure.  Thank you for choosing me and [facility].   Harl Bowie, MD

## 2018-12-21 ENCOUNTER — Ambulatory Visit (INDEPENDENT_AMBULATORY_CARE_PROVIDER_SITE_OTHER): Payer: Medicare HMO | Admitting: Psychiatry

## 2018-12-21 ENCOUNTER — Encounter: Payer: Self-pay | Admitting: Psychiatry

## 2018-12-21 ENCOUNTER — Other Ambulatory Visit: Payer: Self-pay

## 2018-12-21 DIAGNOSIS — F5105 Insomnia due to other mental disorder: Secondary | ICD-10-CM | POA: Diagnosis not present

## 2018-12-21 DIAGNOSIS — F411 Generalized anxiety disorder: Secondary | ICD-10-CM

## 2018-12-21 DIAGNOSIS — F319 Bipolar disorder, unspecified: Secondary | ICD-10-CM

## 2018-12-21 MED ORDER — ZIPRASIDONE HCL 80 MG PO CAPS: 80.0000 mg | ORAL_CAPSULE | Freq: Every day | ORAL | 2 refills | Status: DC

## 2018-12-21 MED ORDER — BUSPIRONE HCL 30 MG PO TABS: 30.0000 mg | ORAL_TABLET | Freq: Two times a day (BID) | ORAL | 0 refills | Status: DC

## 2018-12-21 MED ORDER — OLANZAPINE 15 MG PO TABS: 15.0000 mg | ORAL_TABLET | Freq: Every day | ORAL | 0 refills | Status: DC

## 2018-12-21 MED ORDER — ALPRAZOLAM 1 MG PO TABS: 1.0000 mg | ORAL_TABLET | Freq: Every day | ORAL | 3 refills | Status: DC

## 2018-12-21 NOTE — Progress Notes (Signed)
Robin Welch [id number] 09/10/1965 53 y.o. Virtual Visit via Telephone Note  I connected with Robin Welch on 12/21/18 at  9:30 AM EDT by telephone and verified that I am speaking with the correct person using two identifiers.   I discussed the limitations, risks, security and privacy concerns of performing an evaluation and management service by telephone and the availability of in person appointments. I also discussed with the patient that there may be a patient responsible charge related to this service. The patient expressed understanding and agreed to proceed.   I discussed the assessment and treatment plan with the patient. The patient was provided an opportunity to ask questions and all were answered. The patient agreed with the plan and demonstrated an understanding of the instructions.   The patient was advised to call back or seek an in-person evaluation if the symptoms worsen or if the condition fails to improve as anticipated.  I provided 30 minutes of non-face-to-face time during this encounter.  The patient was located at home.  The provider was located at home.   Robin Welch, PMHNP   Subjective:   Patient ID:  Robin Welch is a 53 y.o. (DOB 05/31/1966) female.  Chief Complaint:  Chief Complaint  Patient presents with  . Anxiety  . Follow-up    History of mood disturbance and insomnia    HPI Robin Welch presents for follow-up of anxiety, mood, and insomnia. "Everything is going ok for me." She reports that she feels that Xanax 0.5 mg is only partially effective- "if I take 2 I am ok for the rest of the day." She reports that if she takes 2 tabs of Xanax she does not need to take Gabapentin. She reports that her memory and cognition has significantly improved and not has had a recent incident of not being able to recall things. She reports that thinking is clearer the days on the days that she does not take Gabapentin. She reports that she has to take Gabapentin for anxiety if  she takes only Xanax 0.5 mg. Reports that anxiety has been better controlled despite multiple stressors with pandemic. She reports that she has had some anxiety with being confined at home and some financial stress with not receiving funds as expected. Denies any recent panic attacks. She reports that her mood has been "good." She reports that she has had some sadness in response to losses. "I wouldn't call it depression... just been a little bit more down state of mind." Denies irritability or elevated mood. Reports that she has occasional low motivation- "I just get frozen in my spot." Reports energy has been ok. She reports that she is sleeping well. Appetite has been good and reports that she has gained some weight. Denies SI.   She reports that she has been using some recordings to help relieve anxiety.    Review of Systems:  Review of Systems  Musculoskeletal: Negative for gait problem.  Neurological: Negative for tremors.  Psychiatric/Behavioral:       Please refer to HPI    Medications: I have reviewed the patient's current medications.  Current Outpatient Medications  Medication Sig Dispense Refill  . acetaminophen (TYLENOL) 500 MG tablet Take 1,000 mg by mouth every 6 (six) hours as needed for mild pain or moderate pain.    Marland Kitchen albuterol (VENTOLIN HFA) 108 (90 Base) MCG/ACT inhaler Inhale 1-2 puffs into the lungs every 6 (six) hours as needed for wheezing or shortness of breath. 18 g 3  . busPIRone (BUSPAR)  30 MG tablet Take 1 tablet (30 mg total) by mouth 2 (two) times daily. 180 tablet 0  . Calcium 500 MG CHEW Chew 1 tablet (500 mg total) by mouth 2 (two) times daily.    . Cholecalciferol (VITAMIN D) 2000 units CAPS Take 1 capsule (2,000 Units total) by mouth daily. 30 capsule   . dexlansoprazole (DEXILANT) 60 MG capsule Take 1 capsule (60 mg total) by mouth every morning. Take 30 minutes before breakfast. 90 capsule 3  . hydrOXYzine (ATARAX/VISTARIL) 25 MG tablet Take 2 tablets (50  mg total) by mouth 3 (three) times daily. 120 tablet 5  . methocarbamol (ROBAXIN) 500 MG tablet Take 1-2 tablets (500-1,000 mg total) by mouth 3 (three) times daily as needed for muscle spasms. 30 tablet 0  . naproxen sodium (ALEVE) 220 MG tablet Take 220 mg by mouth daily as needed.     Marland Kitchen OLANZapine (ZYPREXA) 15 MG tablet Take 1 tablet (15 mg total) by mouth at bedtime. 90 tablet 0  . ondansetron (ZOFRAN ODT) 4 MG disintegrating tablet Take 1 tablet (4 mg total) by mouth daily as needed for nausea or vomiting. 90 tablet 0  . pantoprazole (PROTONIX) 40 MG tablet Take 1 tablet (40 mg total) by mouth 2 (two) times daily before a meal. 60 tablet 11  . QUEtiapine (SEROQUEL) 400 MG tablet Take 2 tablets (800 mg total) by mouth at bedtime. 180 tablet 1  . SF 5000 PLUS 1.1 % CREA dental cream Place 1 application onto teeth at bedtime.   2  . sucralfate (CARAFATE) 1 GM/10ML suspension Take 10 mLs (1 g total) by mouth 4 (four) times daily. 1260 mL 3  . Tiotropium Bromide Monohydrate (SPIRIVA RESPIMAT) 2.5 MCG/ACT AERS Inhale 1 puff into the lungs daily. 4 g 6  . trimethoprim-polymyxin b (POLYTRIM) ophthalmic solution Place 1 drop into both eyes every 6 (six) hours. 10 mL 0  . VENTOLIN HFA 108 (90 Base) MCG/ACT inhaler INHALE TWO puffs into THE lungs EVERY SIX HOURS AS NEEDED wheezind OR SHORTNESS OF BREATH 18 g 3  . ALPRAZolam (XANAX) 1 MG tablet Take 1 tablet (1 mg total) by mouth daily for 30 days. 30 tablet 3  . ziprasidone (GEODON) 80 MG capsule Take 1 capsule (80 mg total) by mouth daily for 30 days. 30 capsule 2   No current facility-administered medications for this visit.     Medication Side Effects: Other: Wt Gain  Allergies:  Allergies  Allergen Reactions  . Prozac [Fluoxetine Hcl] Swelling and Rash  . Doxycycline Nausea And Vomiting  . Celexa [Citalopram Hydrobromide] Other (See Comments)    Possible allergic reaction  . Chantix [Varenicline Tartrate] Swelling    Mouth swelling  .  Hydroxyzine     Not effective  . Klonopin [Clonazepam]     Caused instablility. Not able to drive due to over medicated  . Lunesta [Eszopiclone]     Not effective  . Sonata [Zaleplon]     Not effective  . Trazodone And Nefazodone     ineffective  . Zolpimist [Zolpidem Tartrate]     Not effective  . Azithromycin     Reaction not recalled by the patient    Past Medical History:  Diagnosis Date  . Anxiety   . Barrett esophagus   . Bipolar 1 disorder [facility])    sees Dr. Candis Schatz psych [phone])  . COPD (chronic obstructive pulmonary disease) ([place]) 03/2013   hyperinflation by CXR  . History of cocaine dependence ([place]) latest 2017  undergoing detox  . History of stomach ulcers   . Hx: UTI (urinary tract infection)   . Migraine    "stopped in ~ 2008" (03/11/2017)  . Osteoporosis 06/09/2018   DEXA 05/2017 - T score -2.5 L hip, -1.3 spine  . Osteoporosis   . Positive PPD 1989   s/p CXR and INH 6 mo  . Seizure ([place]) X 1   "from takiing too many headache medicine?" (03/11/2017)  . Smoker     Family History  Problem Relation Age of Onset  . Diabetes Mother        T2DM  . Breast cancer Mother 15  . Dementia Mother   . Lymphoma Mother   . Coronary artery disease Father 26       MI  . Anxiety disorder Paternal Aunt   . Anxiety disorder Cousin   . Depression Cousin   . ADD / ADHD Child   . Anxiety disorder Child   . Depression Child   . Stroke Neg Hx   . Colon cancer Neg Hx   . Cancer - Colon Neg Hx     Social History   Socioeconomic History  . Marital status: Divorced    Spouse name: Not on file  . Number of children: 2  . Years of education: bachelors  . Highest education level: Not on file  Occupational History  . 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 Financial resource strain: Not on file  . Food insecurity:    Worry: Not on file    Inability: Not on file  .  Transportation needs:    Medical: Not on file    Non-medical: Not on file  Tobacco Use  . Smoking status: Current Every Day Smoker    Packs/day: 1.00    Years: 36.00    Pack years: 36.00    Types: Cigarettes  . Smokeless tobacco: Never Used  Substance and Sexual Activity  . Alcohol use: Not Currently    Alcohol/week: 0.0 standard drinks    Comment: 03/11/2017 "stopped 09/18/2015; never an alcoholic"  . Drug use: Not Currently    Types: Cocaine    Comment: 03/11/2017 "stopped 09/18/2015"  . Sexual activity: Never    Birth control/protection: Surgical  Lifestyle  . Physical activity:    Days per week: Not on file    Minutes per session: Not on file  . Stress: Not on file  Relationships  . Social connections:    Talks on phone: Not on file    Gets together: Not on file    Attends religious service: Not on file    Active member of club or organization: Not on file    Attends meetings of clubs or organizations: Not on file    Relationship status: Not on file  . Intimate partner violence:    Fear of current or ex partner: Not on file    Emotionally abused: Not on file    Physically abused: Not on file    Forced sexual activity: Not on file  Other Topics Concern  . Not on file  Social History Narrative   Caffeine: 40 oz diet mountain dew/day   Lives with husband and 2 children, no pets       Past Medical History, Surgical history, Social history, and Family history were reviewed and updated as appropriate.   Please see review of systems for further details on  the patient's review from today.   Objective:   Physical Exam:  LMP 04/23/2015 (Approximate)   Physical Exam Neurological:     Mental Status: She is alert and oriented to person, place, and time.     Cranial Nerves: No dysarthria.  Psychiatric:        Attention and Perception: Attention normal.        Mood and Affect: Mood is anxious.        Speech: Speech normal.        Behavior: Behavior is cooperative.         Thought Content: Thought content normal. Thought content is not paranoid or delusional. Thought content does not include homicidal or suicidal ideation. Thought content does not include homicidal or suicidal plan.        Cognition and Memory: Cognition and memory normal.        Judgment: Judgment normal.     Lab Review:     Component Value Date/Time   NA 124 (L) 12/19/2018 1149   K 3.5 12/19/2018 1149   CL 86 (L) 12/19/2018 1149   CO2 30 12/19/2018 1149   GLUCOSE 122 (H) 12/19/2018 1149   BUN 3 (L) 12/19/2018 1149   CREATININE 0.67 12/19/2018 1149   CALCIUM 8.4 12/19/2018 1149   PROT 6.6 07/13/2018 0929   ALBUMIN 3.7 07/13/2018 0929   AST 12 07/13/2018 0929   ALT 11 07/13/2018 0929   ALKPHOS 122 (H) 07/13/2018 0929   BILITOT 0.3 07/13/2018 0929   GFRNONAA >60 07/11/2018 1050   GFRAA >60 07/11/2018 1050       Component Value Date/Time   WBC 7.9 12/19/2018 1149   RBC 4.16 12/19/2018 1149   HGB 12.1 12/19/2018 1149   HGB 10.8 (L) 07/11/2018 1050   HCT 36.0 12/19/2018 1149   PLT 578.0 (H) 12/19/2018 1149   PLT 547 (H) 07/11/2018 1050   MCV 86.7 12/19/2018 1149   MCH 26.2 07/11/2018 1050   MCHC 33.6 12/19/2018 1149   RDW 13.5 12/19/2018 1149   LYMPHSABS 2.4 12/19/2018 1149   MONOABS 0.6 12/19/2018 1149   EOSABS 0.2 12/19/2018 1149   BASOSABS 0.1 12/19/2018 1149    Lithium Lvl  Date Value Ref Range Status  12/27/2010 1.43 (H) 0.80 - 1.40 mEq/L Final     No results found for: PHENYTOIN, PHENOBARB, VALPROATE, CBMZ   .res Assessment: Plan:   Agreed to increase Xanax to 1 mg daily for anxiety.  Discussed that there would be no future increases in Xanax.  Discussed continuing to reduce gabapentin as tolerated since patient reports having cognitive side effects with gabapentin.  Continue Seroquel and Olanzapine for mood, anxiety, and insomnia.  Continue Buspar 30 mg po BID for anxiety.   Continue Hydroxyzine 50 mg TID for anxiety.   Bipolar I disorder  (HCC)  Generalized anxiety disorder - Plan: ALPRAZolam (XANAX) 1 MG tablet, busPIRone (BUSPAR) 30 MG tablet  Insomnia due to mental disorder - Plan: OLANZapine (ZYPREXA) 15 MG tablet, ziprasidone (GEODON) 80 MG capsule  Please see After Visit Summary for patient specific instructions.  No future appointments.  No orders of the defined types were placed in this encounter.     -------------------------------

## 2019-01-04 ENCOUNTER — Other Ambulatory Visit: Payer: Self-pay

## 2019-01-04 ENCOUNTER — Telehealth: Payer: Self-pay | Admitting: *Deleted

## 2019-01-04 DIAGNOSIS — E871 Hypo-osmolality and hyponatremia: Secondary | ICD-10-CM

## 2019-01-04 NOTE — Telephone Encounter (Signed)
Called patient to offer Monday May 11th EGD appointment  Patient declined stated she No longer wants or needs a EGD she feels much better taking the medication   I informed Dr Silverio Decamp

## 2019-01-05 ENCOUNTER — Other Ambulatory Visit: Payer: Self-pay | Admitting: Psychiatry

## 2019-01-05 DIAGNOSIS — F5105 Insomnia due to other mental disorder: Secondary | ICD-10-CM

## 2019-01-13 ENCOUNTER — Other Ambulatory Visit: Payer: Self-pay | Admitting: Psychiatry

## 2019-01-13 DIAGNOSIS — F411 Generalized anxiety disorder: Secondary | ICD-10-CM

## 2019-01-14 DIAGNOSIS — H04123 Dry eye syndrome of bilateral lacrimal glands: Secondary | ICD-10-CM | POA: Diagnosis not present

## 2019-01-25 DIAGNOSIS — R300 Dysuria: Secondary | ICD-10-CM | POA: Diagnosis not present

## 2019-01-25 DIAGNOSIS — R350 Frequency of micturition: Secondary | ICD-10-CM | POA: Diagnosis not present

## 2019-01-25 DIAGNOSIS — N390 Urinary tract infection, site not specified: Secondary | ICD-10-CM | POA: Diagnosis not present

## 2019-01-27 ENCOUNTER — Other Ambulatory Visit: Payer: Self-pay | Admitting: Psychiatry

## 2019-01-27 DIAGNOSIS — F411 Generalized anxiety disorder: Secondary | ICD-10-CM

## 2019-01-30 ENCOUNTER — Other Ambulatory Visit: Payer: Self-pay | Admitting: Psychiatry

## 2019-01-30 DIAGNOSIS — F411 Generalized anxiety disorder: Secondary | ICD-10-CM

## 2019-01-31 ENCOUNTER — Ambulatory Visit (INDEPENDENT_AMBULATORY_CARE_PROVIDER_SITE_OTHER): Payer: Medicare HMO | Admitting: Family Medicine

## 2019-01-31 ENCOUNTER — Telehealth: Payer: Self-pay

## 2019-01-31 ENCOUNTER — Encounter: Payer: Self-pay | Admitting: Family Medicine

## 2019-01-31 DIAGNOSIS — R197 Diarrhea, unspecified: Secondary | ICD-10-CM | POA: Insufficient documentation

## 2019-01-31 NOTE — Progress Notes (Signed)
I connected with Robin Welch on 01/31/19 at 11:20 AM EDT by video and verified that I am speaking with the correct person using two identifiers.   I discussed the limitations, risks, security and privacy concerns of performing an evaluation and management service by video and the availability of in person appointments. I also discussed with the patient that there may be a patient responsible charge related to this service. The patient expressed understanding and agreed to proceed.  Patient location: Home Provider Location: [place] Participants: Robin Welch and Robin Welch   Subjective:     Robin Welch is a 53 y.o. female presenting for Nausea and Diarrhea (Started almost 2 weeks ago. )     HPI   #nausea/Diarrhea - feels like she has been sick for 12 days - had to change clothes 10 times yesterday - has had intermittent n/v - having stools every 30 minutes - has tried peptobismal, loperimide w/o improvement - was having diarrhea when she went to urgent care but their focus was the UTI - did get started on antibiotics - was also having N/V before the UTI started - now it is watery diarrhea and brownish/black  Endorses chronic vomiting due to reflux symptoms - but that is typically under control  - no one else is sick   Review of Systems  Constitutional: Positive for fatigue. Negative for chills and fever.  HENT: Negative for congestion.   Respiratory: Negative for cough and shortness of breath.   Cardiovascular: Negative for chest pain.  Gastrointestinal: Positive for diarrhea, nausea and vomiting. Negative for abdominal pain and blood in stool.    01/25/2019: urgent care - UTI treated with nitrofurantoin  Social History   Tobacco Use  Smoking Status Current Every Day Smoker  . Packs/day: 1.00  . Years: 36.00  . Pack years: 36.00  . Types: Cigarettes  Smokeless Tobacco Never Used        Objective:   BP Readings from Last 3 Encounters:   12/19/18 120/76  10/05/18 120/76  09/06/18 112/66   Wt Readings from Last 3 Encounters:  12/19/18 129 lb (58.5 kg)  12/16/18 150 lb (68 kg)  10/05/18 129 lb 12 oz (58.9 kg)   LMP 04/23/2015 (Approximate)    Physical Exam Constitutional:      Appearance: Normal appearance. She is not ill-appearing.  HENT:     Head: Normocephalic and atraumatic.     Right Ear: External ear normal.     Left Ear: External ear normal.  Eyes:     Conjunctiva/sclera: Conjunctivae normal.  Pulmonary:     Effort: Pulmonary effort is normal. No respiratory distress.  Neurological:     Mental Status: She is alert. Mental status is at baseline.  Psychiatric:        Mood and Affect: Mood normal.        Behavior: Behavior normal.        Thought Content: Thought content normal.        Judgment: Judgment normal.            Assessment & Plan:   Problem List Items Addressed This Visit      Digestive   Diarrhea of presumed infectious origin - Primary    Pt with known chronic N/V though now worse. Also with new diarrhea complicated by incontinence. Is on antibiotics and has failed over the counter treatment. Will check for infectious cause, however, if negative recommended PCP f/u. If she has been taking a lot  of medications like loperamide and given the incontinence wonder about overflow as a possible cause.       Relevant Orders   Gastrointestinal Pathogen Panel PCR       Return if symptoms worsen or fail to improve.  Robin Noe, MD

## 2019-01-31 NOTE — Assessment & Plan Note (Signed)
Pt with known chronic N/V though now worse. Also with new diarrhea complicated by incontinence. Is on antibiotics and has failed over the counter treatment. Will check for infectious cause, however, if negative recommended PCP f/u. If she has been taking a lot of medications like loperamide and given the incontinence wonder about overflow as a possible cause.

## 2019-01-31 NOTE — Telephone Encounter (Signed)
[facility] - Client TELEPHONE ADVICE RECORD [facility] Patient Name: Robin Welch Gender: Female DOB: Feb 14, 1966 Age: 53 Y 16 M 28 D Return Phone Number: [phone] (Primary) Address: [address] City/State/Zip: [place] [address] Client [facility] - Client Client Site [place] Physician Ria Bush - MD Contact Type Call Who Is Calling Patient / Member / Family / Caregiver Call Type Triage / Clinical Relationship To Patient Self Return Phone Number [phone] (Primary) Chief Complaint Vomiting Reason for Call Symptomatic / Request for [place] states she has been having severe vomiting and diarrhea for 10 days. It is so bad she is soiling her clothes unable to make it to the bathroom. Today is worse and she has a headache. Nothing OTC is working. Translation No Nurse Assessment Nurse: Alveta Heimlich, RN, Santiago Glad Date/Time Eilene Ghazi Time): 01/30/2019 5:54:59 PM Confirm and document reason for call. If symptomatic, describe symptoms. ---Caller states has been having diarrhea and vomiting for the last 10 days. Caller states very watery stools and black and watery. Caller states Caller denies any abdominal pain. Worse today. Has 6 episodes of diarrhea and 1 episode of vomiting. Patient denies any fever. Has the patient had close contact with a person known or suspected to have the novel coronavirus illness OR traveled / lives in area with major community spread (including international travel) in the last 14 days from the onset of symptoms? * If Asymptomatic, screen for exposure and travel within the last 14 days. ---No Does the patient have any new or worsening symptoms? ---Yes Will a triage be completed? ---Yes Related visit to physician within the last 2 weeks? ---No Does the PT have any chronic conditions? (i.e. diabetes, asthma, this includes High  risk factors for pregnancy, etc.) ---Yes List chronic conditions. ---asthma copd Is this a behavioral health or substance abuse call? ---No Guidelines Guideline Title Affirmed Question Affirmed Notes Nurse Date/Time (Eastern Time) Diarrhea [1] SEVERE diarrhea (e.g., 7 or more times / Germain Osgood 01/30/2019 5:58:57 PM PLEASE NOTE: All timestamps contained within this report are represented as [place] Standard Time. CONFIDENTIALTY NOTICE: This fax transmission is intended only for the addressee. It contains information that is legally privileged, confidential or otherwise protected from use or disclosure. If you are not the intended recipient, you are strictly prohibited from reviewing, disclosing, copying using or disseminating any of this information or taking any action in reliance on or regarding this information. If you have received this fax in error, please notify us immediately by telephone so that we can arrange for its return to [place]. Phone: [phone], Toll-Free: [phone], Fax: [phone] Page: 2 of 2 Call Id: [id number] Guidelines Guideline Title Affirmed Question Affirmed Notes Nurse Date/Time Eilene Ghazi Time) day more than normal) AND [2] present > 24 hours (1 day) Disp. Time Eilene Ghazi Time) Disposition Final User 01/30/2019 6:04:46 PM See PCP within 24 Hours Yes Alveta Heimlich, RN, York Pellant Disagree/Comply Comply Caller Understands Yes PreDisposition Go to ED Care Advice Given Per Guideline SEE PCP WITHIN 24 HOURS: * IF OFFICE WILL BE OPEN: You need to be seen within the next 24 hours. Call your doctor (or NP/PA) when the office opens and make an appointment. * IF OFFICE WILL BE CLOSED AND NO PCP (PRIMARY CARE PROVIDER) SECOND-LEVEL TRIAGE: You need to be seen within the next 24 hours. A clinic or an urgent care center is often a good source of care if your doctor's  office is closed or you can't get an appointment. FLUID THERAPY DURING SEVERE DIARRHEA: * Drink more fluids,  at least 8-10 cups daily. One cup equals 8 oz (240 ml). * WATER: Even for severe diarrhea, water is often the best liquid to drink. You should also eat some salty foods (e.g., potato chips, pretzels, saltine crackers). This is important to make sure you are getting enough salt, sugars, and fluids to meet your body's needs. * SPORTS DRINKS: You can also drink a sports drink (e.g., Gatorade, Powerade) to help treat and prevent dehydration. For it to work best, mix it half and half with water. * Avoid alcohol beverages (beer, wine, hard liquor). * Avoid caffeinated beverages (Reason: caffeine is mildly dehydrating). FOOD AND NUTRITION DURING SEVERE DIARRHEA: * Drinking enough liquids is more important that eating when one has severe diarrhea. * As the diarrhea starts to get better, you can slowly return to a normal diet. * Begin with boiled starches / cereals (e.g., potatoes, rice, noodles, wheat, oats) with a small amount of salt to taste. * Other foods that are OK include: bananas, yogurt, crackers, soup. DIARRHEA MEDICINE - Loperamide (Imodium AD): * This medicine helps decrease diarrhea. It is available over-thecounter (OTC) in a drug store. * Adult dosage: 4 mg (2 capsules) is the recommended first dose. You may take an additional 2 mg (1 capsule) after each loose BM. * Maximum dosage: 16 mg per day (8 capsules). * Do not use for more than 2 days. CAUTION - Loperamide (Imodium AD): * DO NOT use if there is a fever over 100.4 F (38.0 C) or if there is blood or mucus in the stools. * Read and follow the package instructions carefully. DIARRHEA MEDICINE - Bismuth Subsalicylate (e.g., Kaopectate, Pepto-Bismol): * This medicine can help reduce diarrhea, vomiting, and abdominal cramping. It is available over-the-counter (OTC) in a drug store. * Adult dosage: Take two tablets or two tablespoons by mouth every hour (if diarrhea continues) to a maximum of 8 doses in a 24 hour period. * Do not use for more  than 2 days. CONTAGIOUSNESS: * Be certain to wash your hands after using the restroom. CAUTION - Bismuth Subsalicylate (e.g., Kaopectate, Pepto-Bismol): * May cause a temporary darkening of stool and tongue. * If your work is cooking, Research scientist (medical), serving or preparing food, then you should not work until the diarrhea has completely stopped. CALL BACK IF: * Signs of dehydration occur (e.g., no urine over 12 hours, very dry mouth, lightheaded, etc.) * Bloody stools * Constant or severe abdominal pain * You become worse. CARE ADVICE given per Diarrhea (Adult) guideline. Referrals REFERRED TO PCP OFFICE

## 2019-01-31 NOTE — Telephone Encounter (Signed)
Noted! Thank you

## 2019-01-31 NOTE — Telephone Encounter (Signed)
Patient has appointment today with Dr. Einar Pheasant at [phone]. Virtual visit.

## 2019-02-02 LAB — GASTROINTESTINAL PATHOGEN PANEL PCR
C. difficile Tox A/B, PCR: NOT DETECTED
Campylobacter, PCR: NOT DETECTED
Cryptosporidium, PCR: NOT DETECTED
E coli (ETEC) LT/ST PCR: NOT DETECTED
E coli (STEC) stx1/stx2, PCR: NOT DETECTED
E coli 0157, PCR: NOT DETECTED
Giardia lamblia, PCR: NOT DETECTED
Norovirus, PCR: NOT DETECTED
Rotavirus A, PCR: NOT DETECTED
Salmonella, PCR: NOT DETECTED
Shigella, PCR: NOT DETECTED

## 2019-02-03 ENCOUNTER — Encounter: Payer: Self-pay | Admitting: Family Medicine

## 2019-02-03 ENCOUNTER — Other Ambulatory Visit: Payer: Self-pay | Admitting: Family Medicine

## 2019-02-03 DIAGNOSIS — R197 Diarrhea, unspecified: Secondary | ICD-10-CM

## 2019-02-03 NOTE — Telephone Encounter (Signed)
Spoke with patient and advised of below. Patient states she thinks Dexilant caused her symptms of uncotrollable dirrhea she was having. She has stopped taking this medication and diarrhea is better as far as she can get to the bathroom when she needs to but still present. Patient said she will follow up with Dr. Darnell Level at some point after the virus maybe. She did not want to cancel referral to GI. There is another referral to GI already in there from April. Not sure if need to cancel this one since another one already in there that [place] has called patient. Advised patient I would send this note to Dr. Einar Pheasant and Dr. Darnell Level as an Juluis Rainier

## 2019-02-03 NOTE — Progress Notes (Signed)
Noted! Thank you

## 2019-02-03 NOTE — Telephone Encounter (Signed)
Pt left v/m that she cannot read mychart and request cb with results of recent labs.pt also note in v/m that she thinks Dexilant might be causing her problem and she cannot take that med.

## 2019-02-03 NOTE — Progress Notes (Signed)
Diarrhea and n/v with close to 2 weeks of symptoms and negative infectious work-up.   Referral to GI placed if symptoms are not improving.

## 2019-02-03 NOTE — Telephone Encounter (Signed)
Robin Welch's GI referral has been completed as she saw GI in [facility] [place]. So new GI referral would be needed if she is to return sooner to GI.  Up to patient - sounds like she did not want to cancel GI referral at this time.

## 2019-02-06 ENCOUNTER — Telehealth: Payer: Self-pay | Admitting: Family Medicine

## 2019-02-06 DIAGNOSIS — R300 Dysuria: Secondary | ICD-10-CM | POA: Diagnosis not present

## 2019-02-06 DIAGNOSIS — R319 Hematuria, unspecified: Secondary | ICD-10-CM | POA: Diagnosis not present

## 2019-02-06 DIAGNOSIS — N390 Urinary tract infection, site not specified: Secondary | ICD-10-CM | POA: Diagnosis not present

## 2019-02-06 NOTE — Telephone Encounter (Signed)
Pt called to request an antibiotic for UTI. She said she had this 2 weeks ago and went to Urgent care. She is in [place] right now and cannot come in for sample. Her symptoms are burning, frequency, full bladder, stinging, lightheadness. Please call [phone]

## 2019-02-06 NOTE — Telephone Encounter (Addendum)
Because she recently had treatment for this it's even more important to collect urine sample for culture prior to starting treatment. How soon could she bring in a sample for [place]? Would offer adding on today or tomorrow for virtual visit - whenever she can bring in sample.  If unable to bring in sample, please go back to [facility] for eval.

## 2019-02-06 NOTE — Telephone Encounter (Signed)
Patient notified as instructed by telephone and verbalized understanding. Patient stated that she is in the process of moving and will go to an UC because she needs an antibiotic today because her symptoms started 3 days ago.

## 2019-03-06 ENCOUNTER — Telehealth: Payer: Self-pay

## 2019-03-06 NOTE — Telephone Encounter (Signed)
Pt left v/m that on 03/04/19 did not eat and on 03/05/19 pt ate jello. Pt taking carafate and pantoprazole.I spoke with pt; Pt passed out last night when went to bathroom; pt is not sure how long she was unconscious. Pt thinks the bipolar med is too strong which is given to pt by Thayer Headings PMHNP. Pt last vomited last night with bright red blood in vomitus. Pt last had watery diarrhea with abdominal pain this morning. Pt did not see blood in diarrhea. Today no H/A,dizziness,CP,SOB,or change in vision. Pt will go to [facility]; someone will drive pt to ED.FYI to Dr Darnell Level.

## 2019-03-07 NOTE — Telephone Encounter (Signed)
I don't see where pt was evaluated at ER last night.  Please call for an update.

## 2019-03-07 NOTE — Telephone Encounter (Signed)
Tried to call patient and got her voicemail. Unable to leave message because mail box was full.

## 2019-03-08 NOTE — Telephone Encounter (Signed)
Attempted to contact pt. No answer. Vm box is full.   Need an update on pt, per Dr. Darnell Level.

## 2019-03-09 NOTE — Telephone Encounter (Signed)
Attempted to contact pt. No answer. Vm box is full.   Need an update on pt, per Dr. Darnell Level.

## 2019-03-10 NOTE — Telephone Encounter (Addendum)
Attempted to contact pt. No answer. Vm box is full.  Mailing a letter.   Need an update on pt, per Dr. Darnell Level.

## 2019-03-13 ENCOUNTER — Other Ambulatory Visit: Payer: Self-pay | Admitting: Psychiatry

## 2019-03-13 DIAGNOSIS — F411 Generalized anxiety disorder: Secondary | ICD-10-CM

## 2019-03-14 DIAGNOSIS — Z135 Encounter for screening for eye and ear disorders: Secondary | ICD-10-CM | POA: Diagnosis not present

## 2019-03-14 DIAGNOSIS — H04123 Dry eye syndrome of bilateral lacrimal glands: Secondary | ICD-10-CM | POA: Diagnosis not present

## 2019-03-14 DIAGNOSIS — H5200 Hypermetropia, unspecified eye: Secondary | ICD-10-CM | POA: Diagnosis not present

## 2019-03-14 NOTE — Telephone Encounter (Signed)
Pharmacy is requesting refills on file? Just filled 07/13

## 2019-03-16 ENCOUNTER — Other Ambulatory Visit: Payer: Self-pay | Admitting: Psychiatry

## 2019-03-16 DIAGNOSIS — F5105 Insomnia due to other mental disorder: Secondary | ICD-10-CM

## 2019-03-17 ENCOUNTER — Ambulatory Visit: Payer: Medicare HMO | Admitting: Gastroenterology

## 2019-03-17 ENCOUNTER — Other Ambulatory Visit: Payer: Self-pay | Admitting: Psychiatry

## 2019-03-17 DIAGNOSIS — F411 Generalized anxiety disorder: Secondary | ICD-10-CM

## 2019-03-23 ENCOUNTER — Other Ambulatory Visit: Payer: Self-pay

## 2019-03-23 NOTE — Telephone Encounter (Signed)
Has not been filled by Dr. Darnell Level last time. Will let provider review first.

## 2019-03-24 MED ORDER — SPIRIVA RESPIMAT 2.5 MCG/ACT IN AERS: 1.0000 | INHALATION_SPRAY | Freq: Every day | RESPIRATORY_TRACT | 6 refills | Status: DC

## 2019-04-03 DIAGNOSIS — F1120 Opioid dependence, uncomplicated: Secondary | ICD-10-CM | POA: Diagnosis not present

## 2019-04-03 DIAGNOSIS — F1420 Cocaine dependence, uncomplicated: Secondary | ICD-10-CM | POA: Diagnosis not present

## 2019-04-05 DIAGNOSIS — F1420 Cocaine dependence, uncomplicated: Secondary | ICD-10-CM | POA: Diagnosis not present

## 2019-04-05 DIAGNOSIS — F1120 Opioid dependence, uncomplicated: Secondary | ICD-10-CM | POA: Diagnosis not present

## 2019-04-07 DIAGNOSIS — S52691A Other fracture of lower end of right ulna, initial encounter for closed fracture: Secondary | ICD-10-CM | POA: Diagnosis not present

## 2019-04-07 DIAGNOSIS — S52551A Other extraarticular fracture of lower end of right radius, initial encounter for closed fracture: Secondary | ICD-10-CM | POA: Diagnosis not present

## 2019-04-07 DIAGNOSIS — S52221A Displaced transverse fracture of shaft of right ulna, initial encounter for closed fracture: Secondary | ICD-10-CM | POA: Diagnosis not present

## 2019-04-07 DIAGNOSIS — S6991XA Unspecified injury of right wrist, hand and finger(s), initial encounter: Secondary | ICD-10-CM | POA: Diagnosis not present

## 2019-04-08 ENCOUNTER — Encounter (HOSPITAL_COMMUNITY)
Admission: EM | Disposition: A | Discharge status: Home / Self Care | Payer: Self-pay | Source: Home / Self Care | Attending: Emergency Medicine

## 2019-04-08 ENCOUNTER — Emergency Department (HOSPITAL_COMMUNITY): Payer: Medicare HMO | Admitting: Anesthesiology

## 2019-04-08 ENCOUNTER — Ambulatory Visit (HOSPITAL_COMMUNITY)
Admission: EM | Admit: 2019-04-08 | Discharge: 2019-04-08 | Disposition: A | Discharge status: Home / Self Care | Payer: Medicare HMO | Attending: Emergency Medicine | Admitting: Emergency Medicine

## 2019-04-08 ENCOUNTER — Encounter (HOSPITAL_COMMUNITY): Payer: Self-pay | Admitting: *Deleted

## 2019-04-08 ENCOUNTER — Other Ambulatory Visit: Payer: Self-pay

## 2019-04-08 ENCOUNTER — Emergency Department (HOSPITAL_COMMUNITY): Payer: Medicare HMO

## 2019-04-08 DIAGNOSIS — S52291A Other fracture of shaft of right ulna, initial encounter for closed fracture: Secondary | ICD-10-CM | POA: Diagnosis not present

## 2019-04-08 DIAGNOSIS — F419 Anxiety disorder, unspecified: Secondary | ICD-10-CM | POA: Diagnosis not present

## 2019-04-08 DIAGNOSIS — S52501A Unspecified fracture of the lower end of right radius, initial encounter for closed fracture: Secondary | ICD-10-CM

## 2019-04-08 DIAGNOSIS — Z888 Allergy status to other drugs, medicaments and biological substances status: Secondary | ICD-10-CM | POA: Diagnosis not present

## 2019-04-08 DIAGNOSIS — G4700 Insomnia, unspecified: Secondary | ICD-10-CM | POA: Diagnosis not present

## 2019-04-08 DIAGNOSIS — S52691A Other fracture of lower end of right ulna, initial encounter for closed fracture: Secondary | ICD-10-CM | POA: Diagnosis not present

## 2019-04-08 DIAGNOSIS — F319 Bipolar disorder, unspecified: Secondary | ICD-10-CM | POA: Insufficient documentation

## 2019-04-08 DIAGNOSIS — S52571A Other intraarticular fracture of lower end of right radius, initial encounter for closed fracture: Secondary | ICD-10-CM | POA: Diagnosis not present

## 2019-04-08 DIAGNOSIS — Z03818 Encounter for observation for suspected exposure to other biological agents ruled out: Secondary | ICD-10-CM | POA: Diagnosis not present

## 2019-04-08 DIAGNOSIS — F17210 Nicotine dependence, cigarettes, uncomplicated: Secondary | ICD-10-CM | POA: Insufficient documentation

## 2019-04-08 DIAGNOSIS — Z20828 Contact with and (suspected) exposure to other viral communicable diseases: Secondary | ICD-10-CM | POA: Diagnosis not present

## 2019-04-08 DIAGNOSIS — K219 Gastro-esophageal reflux disease without esophagitis: Secondary | ICD-10-CM | POA: Insufficient documentation

## 2019-04-08 DIAGNOSIS — J449 Chronic obstructive pulmonary disease, unspecified: Secondary | ICD-10-CM | POA: Insufficient documentation

## 2019-04-08 DIAGNOSIS — S52251A Displaced comminuted fracture of shaft of ulna, right arm, initial encounter for closed fracture: Secondary | ICD-10-CM | POA: Insufficient documentation

## 2019-04-08 DIAGNOSIS — Z881 Allergy status to other antibiotic agents status: Secondary | ICD-10-CM | POA: Diagnosis not present

## 2019-04-08 DIAGNOSIS — Z79899 Other long term (current) drug therapy: Secondary | ICD-10-CM | POA: Insufficient documentation

## 2019-04-08 DIAGNOSIS — S52201A Unspecified fracture of shaft of right ulna, initial encounter for closed fracture: Secondary | ICD-10-CM | POA: Diagnosis not present

## 2019-04-08 DIAGNOSIS — K5909 Other constipation: Secondary | ICD-10-CM | POA: Diagnosis not present

## 2019-04-08 HISTORY — PX: ORIF WRIST FRACTURE: SHX2133

## 2019-04-08 LAB — CBC
HCT: 34.8 % — ABNORMAL LOW (ref 36.0–46.0)
Hemoglobin: 11.6 g/dL — ABNORMAL LOW (ref 12.0–15.0)
MCH: 28.1 pg (ref 26.0–34.0)
MCHC: 33.3 g/dL (ref 30.0–36.0)
MCV: 84.3 fL (ref 80.0–100.0)
Platelets: 434 10*3/uL — ABNORMAL HIGH (ref 150–400)
RBC: 4.13 MIL/uL (ref 3.87–5.11)
RDW: 15.3 % (ref 11.5–15.5)
WBC: 13.5 10*3/uL — ABNORMAL HIGH (ref 4.0–10.5)
nRBC: 0 % (ref 0.0–0.2)

## 2019-04-08 LAB — COMPREHENSIVE METABOLIC PANEL
ALT: 12 U/L (ref 0–44)
AST: 15 U/L (ref 15–41)
Albumin: 2.5 g/dL — ABNORMAL LOW (ref 3.5–5.0)
Alkaline Phosphatase: 84 U/L (ref 38–126)
Anion gap: 11 (ref 5–15)
BUN: 5 mg/dL — ABNORMAL LOW (ref 6–20)
CO2: 23 mmol/L (ref 22–32)
Calcium: 7.9 mg/dL — ABNORMAL LOW (ref 8.9–10.3)
Chloride: 92 mmol/L — ABNORMAL LOW (ref 98–111)
Creatinine, Ser: 0.72 mg/dL (ref 0.44–1.00)
GFR calc Af Amer: >60 mL/min (ref >60)
GFR calc non Af Amer: >60 mL/min (ref >60)
Glucose, Bld: 104 mg/dL — ABNORMAL HIGH (ref 70–99)
Potassium: 3.1 mmol/L — ABNORMAL LOW (ref 3.5–5.1)
Sodium: 126 mmol/L — ABNORMAL LOW (ref 135–145)
Total Bilirubin: 0.6 mg/dL (ref 0.3–1.2)
Total Protein: 5 g/dL — ABNORMAL LOW (ref 6.5–8.1)

## 2019-04-08 LAB — SARS CORONAVIRUS 2 BY RT PCR (HOSPITAL ORDER, PERFORMED IN ~~LOC~~ HOSPITAL LAB): SARS Coronavirus 2: NEGATIVE

## 2019-04-08 SURGERY — OPEN REDUCTION INTERNAL FIXATION (ORIF) WRIST FRACTURE
Anesthesia: Monitor Anesthesia Care | Site: Arm Lower | Laterality: Right

## 2019-04-08 MED ORDER — FENTANYL CITRATE (PF) 100 MCG/2ML IJ SOLN
50.0000 ug | Freq: Once | INTRAMUSCULAR | Status: AC
  Administered 2019-04-08: 50 ug via INTRAVENOUS
  Filled 2019-04-08: qty 2

## 2019-04-08 MED ORDER — LACTATED RINGERS IV SOLN
INTRAVENOUS | Status: DC
  Administered 2019-04-08: 14:00:00 via INTRAVENOUS

## 2019-04-08 MED ORDER — FENTANYL CITRATE (PF) 100 MCG/2ML IJ SOLN: 25.0000 ug | INTRAMUSCULAR | Status: DC | PRN

## 2019-04-08 MED ORDER — CELECOXIB 200 MG PO CAPS
ORAL_CAPSULE | ORAL | Status: AC
  Administered 2019-04-08: 400 mg via ORAL
  Filled 2019-04-08: qty 2

## 2019-04-08 MED ORDER — CEFAZOLIN SODIUM-DEXTROSE 2-4 GM/100ML-% IV SOLN: 2.0000 g | Freq: Once | INTRAVENOUS | Status: DC

## 2019-04-08 MED ORDER — ONDANSETRON HCL 4 MG/2ML IJ SOLN
INTRAMUSCULAR | Status: DC | PRN
  Administered 2019-04-08: 4 mg via INTRAVENOUS

## 2019-04-08 MED ORDER — PROMETHAZINE HCL 25 MG/ML IJ SOLN
6.2500 mg | INTRAMUSCULAR | Status: DC | PRN
  Administered 2019-04-08: 18:00:00 6.25 mg via INTRAVENOUS

## 2019-04-08 MED ORDER — FENTANYL CITRATE (PF) 100 MCG/2ML IJ SOLN
50.0000 ug | Freq: Once | INTRAMUSCULAR | Status: AC
  Administered 2019-04-08: 10:00:00 50 ug via INTRAVENOUS
  Filled 2019-04-08: qty 2

## 2019-04-08 MED ORDER — CEFAZOLIN SODIUM-DEXTROSE 2-3 GM-%(50ML) IV SOLR
INTRAVENOUS | Status: DC | PRN
  Administered 2019-04-08: 2 g via INTRAVENOUS

## 2019-04-08 MED ORDER — ACETAMINOPHEN 500 MG PO TABS
ORAL_TABLET | ORAL | Status: AC
  Administered 2019-04-08: 14:00:00 1000 mg via ORAL
  Filled 2019-04-08: qty 2

## 2019-04-08 MED ORDER — HYDROCODONE-ACETAMINOPHEN 5-325 MG PO TABS: 1.0000 | ORAL_TABLET | Freq: Four times a day (QID) | ORAL | 0 refills | Status: AC | PRN

## 2019-04-08 MED ORDER — CEFAZOLIN SODIUM-DEXTROSE 2-4 GM/100ML-% IV SOLN
INTRAVENOUS | Status: AC
  Filled 2019-04-08: qty 100

## 2019-04-08 MED ORDER — MIDAZOLAM HCL 2 MG/2ML IJ SOLN
INTRAMUSCULAR | Status: AC
  Filled 2019-04-08: qty 2

## 2019-04-08 MED ORDER — MIDAZOLAM HCL 2 MG/2ML IJ SOLN
INTRAMUSCULAR | Status: AC
  Administered 2019-04-08: 2 mg via INTRAVENOUS
  Filled 2019-04-08: qty 2

## 2019-04-08 MED ORDER — FENTANYL CITRATE (PF) 250 MCG/5ML IJ SOLN
INTRAMUSCULAR | Status: AC
  Filled 2019-04-08: qty 5

## 2019-04-08 MED ORDER — GLYCOPYRROLATE 0.2 MG/ML IJ SOLN
INTRAMUSCULAR | Status: DC | PRN
  Administered 2019-04-08: .1 mg via INTRAVENOUS

## 2019-04-08 MED ORDER — PROPOFOL 10 MG/ML IV BOLUS
INTRAVENOUS | Status: DC | PRN
  Administered 2019-04-08: 30 mg via INTRAVENOUS
  Administered 2019-04-08: 70 mg via INTRAVENOUS

## 2019-04-08 MED ORDER — ONDANSETRON HCL 4 MG/2ML IJ SOLN
4.0000 mg | Freq: Once | INTRAMUSCULAR | Status: AC
  Administered 2019-04-08: 4 mg via INTRAVENOUS
  Filled 2019-04-08: qty 2

## 2019-04-08 MED ORDER — FENTANYL CITRATE (PF) 100 MCG/2ML IJ SOLN
INTRAMUSCULAR | Status: AC
  Administered 2019-04-08: 100 ug via INTRAVENOUS
  Filled 2019-04-08: qty 2

## 2019-04-08 MED ORDER — MIDAZOLAM HCL 2 MG/2ML IJ SOLN
2.0000 mg | Freq: Once | INTRAMUSCULAR | Status: AC
  Administered 2019-04-08: 14:00:00 2 mg via INTRAVENOUS

## 2019-04-08 MED ORDER — BUPIVACAINE LIPOSOME 1.3 % IJ SUSP
INTRAMUSCULAR | Status: DC | PRN
  Administered 2019-04-08: 10 mL via PERINEURAL

## 2019-04-08 MED ORDER — ACETAMINOPHEN 500 MG PO TABS
1000.0000 mg | ORAL_TABLET | Freq: Once | ORAL | Status: AC
  Administered 2019-04-08: 14:00:00 1000 mg via ORAL

## 2019-04-08 MED ORDER — CELECOXIB 200 MG PO CAPS
400.0000 mg | ORAL_CAPSULE | Freq: Once | ORAL | Status: AC
  Administered 2019-04-08: 400 mg via ORAL

## 2019-04-08 MED ORDER — FENTANYL CITRATE (PF) 100 MCG/2ML IJ SOLN
100.0000 ug | Freq: Once | INTRAMUSCULAR | Status: AC
  Administered 2019-04-08: 100 ug via INTRAVENOUS

## 2019-04-08 MED ORDER — BUPIVACAINE HCL (PF) 0.25 % IJ SOLN
INTRAMUSCULAR | Status: AC
  Filled 2019-04-08: qty 30

## 2019-04-08 MED ORDER — SODIUM CHLORIDE 0.9 % IV SOLN
INTRAVENOUS | Status: DC | PRN
  Administered 2019-04-08: 20 ug/min via INTRAVENOUS

## 2019-04-08 MED ORDER — BUPIVACAINE HCL (PF) 0.5 % IJ SOLN
INTRAMUSCULAR | Status: DC | PRN
  Administered 2019-04-08: 15 mL via PERINEURAL

## 2019-04-08 MED ORDER — 0.9 % SODIUM CHLORIDE (POUR BTL) OPTIME
TOPICAL | Status: DC | PRN
  Administered 2019-04-08: 1000 mL

## 2019-04-08 MED ORDER — PROPOFOL 10 MG/ML IV BOLUS
INTRAVENOUS | Status: AC
  Filled 2019-04-08: qty 20

## 2019-04-08 MED ORDER — PROPOFOL 500 MG/50ML IV EMUL
INTRAVENOUS | Status: DC | PRN
  Administered 2019-04-08: 70 ug/kg/min via INTRAVENOUS

## 2019-04-08 MED ORDER — PROMETHAZINE HCL 25 MG/ML IJ SOLN
INTRAMUSCULAR | Status: AC
  Filled 2019-04-08: qty 1

## 2019-04-08 MED ORDER — ONDANSETRON HCL 4 MG/2ML IJ SOLN
4.0000 mg | Freq: Once | INTRAMUSCULAR | Status: AC
  Administered 2019-04-08: 10:00:00 4 mg via INTRAVENOUS
  Filled 2019-04-08: qty 2

## 2019-04-08 SURGICAL SUPPLY — 65 items
BIT DRILL 2.0 LNG QUCK RELEASE (BIT) ×1 IMPLANT
BIT DRILL 2.8 QUICK RELEASE (BIT) ×1 IMPLANT
BNDG ELASTIC 3X5.8 VLCR STR LF (GAUZE/BANDAGES/DRESSINGS) ×3 IMPLANT
BNDG ELASTIC 4X5.8 VLCR STR LF (GAUZE/BANDAGES/DRESSINGS) ×3 IMPLANT
BNDG ESMARK 4X9 LF (GAUZE/BANDAGES/DRESSINGS) ×3 IMPLANT
BNDG GAUZE ELAST 4 BULKY (GAUZE/BANDAGES/DRESSINGS) ×3 IMPLANT
CANISTER SUCT 3000ML PPV (MISCELLANEOUS) ×3 IMPLANT
CORD BIPOLAR FORCEPS 12FT (ELECTRODE) ×3 IMPLANT
COVER SURGICAL LIGHT HANDLE (MISCELLANEOUS) ×3 IMPLANT
COVER WAND RF STERILE (DRAPES) ×3 IMPLANT
CUFF TOURN SGL QUICK 18X4 (TOURNIQUET CUFF) ×3 IMPLANT
CUFF TOURN SGL QUICK 24 (TOURNIQUET CUFF)
CUFF TRNQT CYL 24X4X16.5-23 (TOURNIQUET CUFF) IMPLANT
DRAPE OEC MINIVIEW 54X84 (DRAPES) ×3 IMPLANT
DRAPE SURG 17X23 STRL (DRAPES) ×3 IMPLANT
DRILL 2.0 LNG QUICK RELEASE (BIT) ×3
DRILL 2.8 QUICK RELEASE (BIT) ×3
GAUZE SPONGE 4X4 12PLY STRL LF (GAUZE/BANDAGES/DRESSINGS) ×3 IMPLANT
GAUZE XEROFORM 5X9 LF (GAUZE/BANDAGES/DRESSINGS) ×3 IMPLANT
GLOVE INDICATOR 8.0 STRL GRN (GLOVE) ×3 IMPLANT
GLOVE SURG SYN 7.5  E (GLOVE) ×2
GLOVE SURG SYN 7.5 E (GLOVE) ×1 IMPLANT
GOWN STRL REUS W/ TWL LRG LVL3 (GOWN DISPOSABLE) ×2 IMPLANT
GOWN STRL REUS W/TWL LRG LVL3 (GOWN DISPOSABLE) ×4
GUIDEWIRE ORTHO 0.054X6 (WIRE) ×12 IMPLANT
KIT BASIN OR (CUSTOM PROCEDURE TRAY) ×3 IMPLANT
KIT TURNOVER KIT B (KITS) ×3 IMPLANT
LOOP VESSEL MAXI BLUE (MISCELLANEOUS) IMPLANT
MANIFOLD NEPTUNE II (INSTRUMENTS) IMPLANT
NEEDLE 22X1 1/2 (OR ONLY) (NEEDLE) IMPLANT
NS IRRIG 1000ML POUR BTL (IV SOLUTION) ×3 IMPLANT
PACK ORTHO EXTREMITY (CUSTOM PROCEDURE TRAY) ×3 IMPLANT
PAD ARMBOARD 7.5X6 YLW CONV (MISCELLANEOUS) ×6 IMPLANT
PAD CAST 3X4 CTTN HI CHSV (CAST SUPPLIES) ×1 IMPLANT
PAD CAST 4YDX4 CTTN HI CHSV (CAST SUPPLIES) ×1 IMPLANT
PADDING CAST COTTON 3X4 STRL (CAST SUPPLIES) ×2
PADDING CAST COTTON 4X4 STRL (CAST SUPPLIES) ×2
PADDING CAST COTTON 6X4 STRL (CAST SUPPLIES) ×3 IMPLANT
PLATE ACULOCK 2 NARROW RT (Plate) ×3 IMPLANT
PLATE ULNA MIDSHAFT 8 HOLE (Plate) ×3 IMPLANT
SCREW BN FT 16X2.3XLCK HEX CRT (Screw) ×2 IMPLANT
SCREW CORT FT 18X2.3XLCK HEX (Screw) ×3 IMPLANT
SCREW CORTICAL LOCKING 2.3X14M (Screw) ×3 IMPLANT
SCREW CORTICAL LOCKING 2.3X16M (Screw) ×4 IMPLANT
SCREW CORTICAL LOCKING 2.3X18M (Screw) ×6 IMPLANT
SCREW LOCK 12X3.5X HEXALOBE (Screw) ×1 IMPLANT
SCREW LOCKING 3.5X10MM (Screw) ×15 IMPLANT
SCREW LOCKING 3.5X12 (Screw) ×2 IMPLANT
SCREW NON LOCK 3.5X10MM (Screw) ×6 IMPLANT
SCREW NON TOGG 2.3X22MM (Screw) ×3 IMPLANT
SCREW NONLOCK HEX 3.5X12 (Screw) ×6 IMPLANT
SOL PREP POV-IOD 4OZ 10% (MISCELLANEOUS) ×3 IMPLANT
SOL PREP PROV IODINE SCRUB 4OZ (MISCELLANEOUS) ×3 IMPLANT
SPLINT FIBERGLASS 3X35 (CAST SUPPLIES) ×3 IMPLANT
SPONGE LAP 4X18 RFD (DISPOSABLE) IMPLANT
SUT MNCRL AB 4-0 PS2 18 (SUTURE) IMPLANT
SUT PROLENE 3 0 PS 2 (SUTURE) IMPLANT
SUT PROLENE 4 0 PS 2 18 (SUTURE) ×9 IMPLANT
SUT VIC AB 3-0 FS2 27 (SUTURE) ×3 IMPLANT
SYR CONTROL 10ML LL (SYRINGE) IMPLANT
TOWEL GREEN STERILE (TOWEL DISPOSABLE) ×3 IMPLANT
TOWEL GREEN STERILE FF (TOWEL DISPOSABLE) ×3 IMPLANT
TUBE CONNECTING 12'X1/4 (SUCTIONS) ×1
TUBE CONNECTING 12X1/4 (SUCTIONS) ×2 IMPLANT
WATER STERILE IRR 1000ML POUR (IV SOLUTION) ×3 IMPLANT

## 2019-04-08 NOTE — Anesthesia Procedure Notes (Signed)
Anesthesia Regional Block: Supraclavicular block   Pre-Anesthetic Checklist: ,, timeout performed, Correct Patient, Correct Site, Correct Laterality, Correct Procedure, Correct Position, site marked, Risks and benefits discussed,  Surgical consent,  Pre-op evaluation,  At surgeon's request and post-op pain management  Laterality: Left  Prep: chloraprep       Needles:  Injection technique: Single-shot  Needle Type: Echogenic Stimulator Needle     Needle Length: 5cm  Needle Gauge: 22     Additional Needles:   Narrative:  Start time: 04/08/2019 1:56 PM End time: 04/08/2019 2:06 PM Injection made incrementally with aspirations every 5 mL.  Performed by: Personally  Anesthesiologist: Duane Boston, MD  Additional Notes: Functioning IV was confirmed and monitors applied.  A 53mm 22ga echogenic arrow stimulator was used. Sterile prep and drape,hand hygiene and sterile gloves were used.Ultrasound guidance: relevant anatomy identified, needle position confirmed, local anesthetic spread visualized around nerve(s)., vascular puncture avoided.  Image printed for medical record.  Negative aspiration and negative test dose prior to incremental administration of local anesthetic. The patient tolerated the procedure well.

## 2019-04-08 NOTE — Transfer of Care (Signed)
Immediate Anesthesia Transfer of Care Note  Patient: Robin Welch  Procedure(s) Performed: OPEN REDUCTION INTERNAL FIXATION (ORIF) DISTAL RADIUS/ULNA FRACTURE (Right Arm Lower)  Patient Location: PACU  Anesthesia Type:MAC combined with regional for post-op pain  Level of Consciousness: awake, alert , oriented and patient cooperative  Airway & Oxygen Therapy: Patient Spontanous Breathing and Patient connected to nasal cannula oxygen  Post-op Assessment: Report given to RN and Post -op Vital signs reviewed and stable  Post vital signs: Reviewed and stable  Last Vitals:  Vitals Value Taken Time  BP 132/107 04/08/19 1635  Temp    Pulse 90 04/08/19 1636  Resp 23 04/08/19 1636  SpO2 94 % 04/08/19 1636  Vitals shown include unvalidated device data.  Last Pain:  Vitals:   04/08/19 1204  TempSrc:   PainSc: 8          Complications: No apparent anesthesia complications

## 2019-04-08 NOTE — Anesthesia Postprocedure Evaluation (Signed)
Anesthesia Post Note  Patient: Robin Welch  Procedure(s) Performed: OPEN REDUCTION INTERNAL FIXATION (ORIF) DISTAL RADIUS/ULNA FRACTURE (Right Arm Lower)     Patient location during evaluation: PACU Anesthesia Type: Regional and MAC Level of consciousness: awake and alert Pain management: pain level controlled Vital Signs Assessment: post-procedure vital signs reviewed and stable Respiratory status: spontaneous breathing and respiratory function stable Cardiovascular status: stable Postop Assessment: no apparent nausea or vomiting Anesthetic complications: no    Last Vitals:  Vitals:   04/08/19 1735 04/08/19 1815  BP: 125/74   Pulse: 93   Resp: (!) 21   Temp:  36.6 C  SpO2: 99%     Last Pain:  Vitals:   04/08/19 1735  TempSrc:   PainSc: 0-No pain                 Sullivan Blasing DANIEL
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
## 2019-04-08 NOTE — Progress Notes (Signed)
Patient monitored during block. NSR to ST rate 95- 106. SpO2 99% on 2 lpm, RR 16
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
## 2019-04-08 NOTE — Discharge Instructions (Signed)
Closed Reduction for Wrist or Forearm, Care After Refer to this sheet in the next few weeks. These instructions provide you with information about caring for yourself after your procedure. Your health care provider may also give you more specific instructions. Your treatment has been planned according to current medical practices, but problems sometimes occur. Call your health care provider if you have any problems or questions after your procedure. What can I expect after the procedure? After the procedure, it is common to have:  Pain.  Swelling.  Mild tingling or numbness. Follow these instructions at home: If you have a splint:   Wear the splint as told by your health care provider. Remove it only as told by your health care provider.  Loosen the splint if your fingers tingle, become numb, or turn cold and blue. Do not let the splintget wet if it is not waterproof.  Keep the splint clean. If you have a cast:  Do not stick anything inside the cast to scratch your skin. Doing that increases your risk of infection.  Check the skin around the cast every day. Tell your health care provider about any concerns.  You may put lotion on dry skin around the edges of the cast. Do not put lotion on the skin underneath the cast.  Do not let your cast get wet if it is not waterproof.  Keep the cast clean. Managing pain, stiffness, and swelling  If directed, put ice on the injured area. ? If you have a removable splint, remove it as told by your health care provider. ? Put ice in a plastic bag. ? Place a towel between your skin and the bag or between your cast and the bag. ? Leave the ice on for 20 minutes, 2-3 times per day.  Move your fingers often to avoid stiffness and to lessen swelling.  Raise (elevate) the injured area above the level of your heart while you are sitting or lying down. Driving  Do not drive or use heavy machinery while taking prescription pain medicine.  Do  not drive for 24 hours if you received a medicine to help you relax (sedative).  Ask your health care provider when it is safe to drive if you have a cast or splint on your arm. Activity  Return to your normal activities as told by your health care provider. Ask your health care provider what activities are safe for you.  Do exercises as told by your health care provider. General instructions  Do not put pressure on any part of the cast or splint until it is fully hardened. This may take several hours.  Do not use any tobacco products, such as cigarettes, chewing tobacco, and e-cigarettes. These can delay bone healing. If you need help quitting, ask your health care provider.  Take over-the-counter and prescription medicines only as told by your health care provider.  If your cast or splint is not waterproof, cover it with a watertight coveringwhen you take a bath or a shower.  Keep all follow-up visits as told by your health care provider. This is important. Contact a health care provider if:  You have a fever.  You have pain that is not controlled by your pain medicine.  You have new or increasing numbness. Get help right away if:  You have severe pain.  You have a severe increase in swelling.  Your fingers become very cold or blue.  Your fingers become numb or feel tingly. This information is not  intended to replace advice given to you by your health care provider. Make sure you discuss any questions you have with your health care provider. Document Released: 09/01/2015 Document Revised: 04/09/2016 Document Reviewed: 09/01/2015 Elsevier Patient Education  2020 [place].   Closed Reduction for Wrist or Forearm, Care After Refer to this sheet in the next few weeks. These instructions provide you with information about caring for yourself after your procedure. Your health care provider may also give you more specific instructions. Your treatment has been planned  according to current medical practices, but problems sometimes occur. Call your health care provider if you have any problems or questions after your procedure. What can I expect after the procedure? After the procedure, it is common to have:  Pain.  Swelling.  Mild tingling or numbness. Follow these instructions at home: If you have a splint:   Wear the splint as told by your health care provider. Remove it only as told by your health care provider.  Loosen the splint if your fingers tingle, become numb, or turn cold and blue. Do not let the splintget wet if it is not waterproof.  Keep the splint clean. If you have a cast:  Do not stick anything inside the cast to scratch your skin. Doing that increases your risk of infection.  Check the skin around the cast every day. Tell your health care provider about any concerns.  You may put lotion on dry skin around the edges of the cast. Do not put lotion on the skin underneath the cast.  Do not let your cast get wet if it is not waterproof.  Keep the cast clean. Managing pain, stiffness, and swelling  If directed, put ice on the injured area. ? If you have a removable splint, remove it as told by your health care provider. ? Put ice in a plastic bag. ? Place a towel between your skin and the bag or between your cast and the bag. ? Leave the ice on for 20 minutes, 2-3 times per day.  Move your fingers often to avoid stiffness and to lessen swelling.  Raise (elevate) the injured area above the level of your heart while you are sitting or lying down. Driving  Do not drive or use heavy machinery while taking prescription pain medicine.  Do not drive for 24 hours if you received a medicine to help you relax (sedative).  Ask your health care provider when it is safe to drive if you have a cast or splint on your arm. Activity  Return to your normal activities as told by your health care provider. Ask your health care provider  what activities are safe for you.  Do exercises as told by your health care provider. General instructions  Do not put pressure on any part of the cast or splint until it is fully hardened. This may take several hours.  Do not use any tobacco products, such as cigarettes, chewing tobacco, and e-cigarettes. These can delay bone healing. If you need help quitting, ask your health care provider.  Take over-the-counter and prescription medicines only as told by your health care provider.  If your cast or splint is not waterproof, cover it with a watertight coveringwhen you take a bath or a shower.  Keep all follow-up visits as told by your health care provider. This is important. Contact a health care provider if:  You have a fever.  You have pain that is not controlled by your pain medicine.  You  have new or increasing numbness. Get help right away if:  You have severe pain.  You have a severe increase in swelling.  Your fingers become very cold or blue.  Your fingers become numb or feel tingly. This information is not intended to replace advice given to you by your health care provider. Make sure you discuss any questions you have with your health care provider. Document Released: 09/01/2015 Document Revised: 04/09/2016 Document Reviewed: 09/01/2015 Elsevier Patient Education  [place]. Discharge Instructions  - Keep dressings in place. Do not remove them. - The dressings must stay dry - Take all medication as prescribed. Transition to over the counter pain medication as your pain improves - Keep the hand elevated over the next 48-72 hours to help with pain and swelling - Move all digits not restricted by the dressings regularly to prevent stiffness - Please call to schedule a follow up appointment with Dr. Jeannie Fend and therapy at [phone] for 10-14 days following surgery - Your pain medication have been send digitally to your pharmacy

## 2019-04-08 NOTE — Anesthesia Preprocedure Evaluation (Addendum)
Anesthesia Evaluation  Patient identified by MRN, date of birth, ID band Patient awake    Reviewed: Allergy & Precautions, NPO status , Patient's Chart, lab work & pertinent test results  History of Anesthesia Complications Negative for: history of anesthetic complications  Airway Mallampati: II  TM Distance: >3 FB Neck ROM: Full    Dental no notable dental hx. (+) Dental Advisory Given   Pulmonary COPD,  COPD inhaler, Current Smoker,    Pulmonary exam normal        Cardiovascular negative cardio ROS Normal cardiovascular exam     Neuro/Psych Anxiety Bipolar Disorder negative neurological ROS     GI/Hepatic GERD  ,(+)     substance abuse  ,   Endo/Other  negative endocrine ROS  Renal/GU negative Renal ROS     Musculoskeletal negative musculoskeletal ROS (+)   Abdominal   Peds  Hematology negative hematology ROS (+) anemia ,   Anesthesia Other Findings Day of surgery medications reviewed with the patient.  Reproductive/Obstetrics                            Anesthesia Physical Anesthesia Plan  ASA: III  Anesthesia Plan: Regional and MAC   Post-op Pain Management:    Induction: Intravenous  PONV Risk Score and Plan: 2 and Ondansetron and Propofol infusion  Airway Management Planned: Natural Airway  Additional Equipment:   Intra-op Plan:   Post-operative Plan:   Informed Consent: I have reviewed the patients History and Physical, chart, labs and discussed the procedure including the risks, benefits and alternatives for the proposed anesthesia with the patient or authorized representative who has indicated his/her understanding and acceptance.     Dental advisory given  Plan Discussed with: Anesthesiologist, Surgeon and CRNA  Anesthesia Plan Comments:       Anesthesia Quick Evaluation
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
## 2019-04-10 ENCOUNTER — Telehealth: Payer: Self-pay

## 2019-04-10 ENCOUNTER — Other Ambulatory Visit: Payer: Self-pay | Admitting: Family Medicine

## 2019-04-10 NOTE — Telephone Encounter (Signed)
Last office visit 01/31/2019 with Dr. Einar Pheasant for diarrhea.  Zofran is not on current medication list.  Refill?
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
## 2019-04-11 ENCOUNTER — Encounter (HOSPITAL_COMMUNITY): Payer: Self-pay | Admitting: Orthopaedic Surgery

## 2019-04-11 NOTE — Telephone Encounter (Signed)
No need for f/u here if seeing ortho. Thanks.
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
## 2019-04-12 NOTE — Telephone Encounter (Signed)
Noted. Encounter closed.

## 2019-04-17 DIAGNOSIS — M25532 Pain in left wrist: Secondary | ICD-10-CM | POA: Diagnosis not present

## 2019-04-17 DIAGNOSIS — Z4789 Encounter for other orthopedic aftercare: Secondary | ICD-10-CM | POA: Diagnosis not present

## 2019-04-19 ENCOUNTER — Other Ambulatory Visit: Payer: Self-pay | Admitting: Psychiatry

## 2019-04-19 DIAGNOSIS — F5105 Insomnia due to other mental disorder: Secondary | ICD-10-CM

## 2019-04-20 ENCOUNTER — Encounter: Payer: Self-pay | Admitting: Family Medicine

## 2019-04-20 DIAGNOSIS — S52502A Unspecified fracture of the lower end of left radius, initial encounter for closed fracture: Secondary | ICD-10-CM

## 2019-04-20 DIAGNOSIS — S52602A Unspecified fracture of lower end of left ulna, initial encounter for closed fracture: Secondary | ICD-10-CM

## 2019-04-20 DIAGNOSIS — S52601S Unspecified fracture of lower end of right ulna, sequela: Secondary | ICD-10-CM | POA: Insufficient documentation

## 2019-04-20 DIAGNOSIS — S52501A Unspecified fracture of the lower end of right radius, initial encounter for closed fracture: Secondary | ICD-10-CM | POA: Insufficient documentation

## 2019-04-20 HISTORY — DX: Unspecified fracture of the lower end of left radius, initial encounter for closed fracture: S52.502A

## 2019-04-20 HISTORY — DX: Unspecified fracture of lower end of left ulna, initial encounter for closed fracture: S52.602A

## 2019-04-24 ENCOUNTER — Other Ambulatory Visit: Payer: Self-pay | Admitting: *Deleted

## 2019-04-24 DIAGNOSIS — F1120 Opioid dependence, uncomplicated: Secondary | ICD-10-CM | POA: Diagnosis not present

## 2019-04-24 DIAGNOSIS — F319 Bipolar disorder, unspecified: Secondary | ICD-10-CM | POA: Diagnosis not present

## 2019-04-24 DIAGNOSIS — E876 Hypokalemia: Secondary | ICD-10-CM | POA: Diagnosis not present

## 2019-04-24 DIAGNOSIS — J90 Pleural effusion, not elsewhere classified: Secondary | ICD-10-CM | POA: Diagnosis not present

## 2019-04-24 DIAGNOSIS — F13239 Sedative, hypnotic or anxiolytic dependence with withdrawal, unspecified: Secondary | ICD-10-CM | POA: Diagnosis not present

## 2019-04-24 DIAGNOSIS — S5291XA Unspecified fracture of right forearm, initial encounter for closed fracture: Secondary | ICD-10-CM | POA: Diagnosis not present

## 2019-04-24 DIAGNOSIS — S2242XA Multiple fractures of ribs, left side, initial encounter for closed fracture: Secondary | ICD-10-CM | POA: Diagnosis not present

## 2019-04-24 DIAGNOSIS — E871 Hypo-osmolality and hyponatremia: Secondary | ICD-10-CM | POA: Diagnosis not present

## 2019-04-24 DIAGNOSIS — F1320 Sedative, hypnotic or anxiolytic dependence, uncomplicated: Secondary | ICD-10-CM | POA: Diagnosis not present

## 2019-04-24 DIAGNOSIS — S5291XD Unspecified fracture of right forearm, subsequent encounter for closed fracture with routine healing: Secondary | ICD-10-CM | POA: Diagnosis not present

## 2019-04-24 DIAGNOSIS — J918 Pleural effusion in other conditions classified elsewhere: Secondary | ICD-10-CM | POA: Diagnosis not present

## 2019-04-24 DIAGNOSIS — F17200 Nicotine dependence, unspecified, uncomplicated: Secondary | ICD-10-CM | POA: Diagnosis not present

## 2019-04-24 DIAGNOSIS — F1123 Opioid dependence with withdrawal: Secondary | ICD-10-CM | POA: Diagnosis not present

## 2019-04-24 DIAGNOSIS — J9811 Atelectasis: Secondary | ICD-10-CM | POA: Diagnosis not present

## 2019-04-24 DIAGNOSIS — R911 Solitary pulmonary nodule: Secondary | ICD-10-CM | POA: Diagnosis not present

## 2019-04-24 DIAGNOSIS — F1420 Cocaine dependence, uncomplicated: Secondary | ICD-10-CM | POA: Diagnosis not present

## 2019-04-24 DIAGNOSIS — D649 Anemia, unspecified: Secondary | ICD-10-CM | POA: Diagnosis not present

## 2019-04-24 MED ORDER — PANTOPRAZOLE SODIUM 40 MG PO TBEC: 40.0000 mg | DELAYED_RELEASE_TABLET | Freq: Every day | ORAL | 3 refills | Status: DC

## 2019-04-26 DIAGNOSIS — J918 Pleural effusion in other conditions classified elsewhere: Secondary | ICD-10-CM | POA: Diagnosis not present

## 2019-04-26 DIAGNOSIS — E876 Hypokalemia: Secondary | ICD-10-CM | POA: Diagnosis not present

## 2019-04-26 DIAGNOSIS — J9811 Atelectasis: Secondary | ICD-10-CM | POA: Diagnosis not present

## 2019-04-26 DIAGNOSIS — J90 Pleural effusion, not elsewhere classified: Secondary | ICD-10-CM | POA: Diagnosis not present

## 2019-04-26 DIAGNOSIS — S5291XD Unspecified fracture of right forearm, subsequent encounter for closed fracture with routine healing: Secondary | ICD-10-CM | POA: Diagnosis not present

## 2019-04-26 DIAGNOSIS — S2242XA Multiple fractures of ribs, left side, initial encounter for closed fracture: Secondary | ICD-10-CM | POA: Diagnosis not present

## 2019-04-26 DIAGNOSIS — D649 Anemia, unspecified: Secondary | ICD-10-CM | POA: Diagnosis not present

## 2019-04-26 DIAGNOSIS — E871 Hypo-osmolality and hyponatremia: Secondary | ICD-10-CM | POA: Diagnosis not present

## 2019-04-26 DIAGNOSIS — R911 Solitary pulmonary nodule: Secondary | ICD-10-CM | POA: Diagnosis not present

## 2019-04-26 DIAGNOSIS — S5291XA Unspecified fracture of right forearm, initial encounter for closed fracture: Secondary | ICD-10-CM | POA: Diagnosis not present

## 2019-05-01 DIAGNOSIS — F13239 Sedative, hypnotic or anxiolytic dependence with withdrawal, unspecified: Secondary | ICD-10-CM | POA: Diagnosis not present

## 2019-05-01 DIAGNOSIS — F1420 Cocaine dependence, uncomplicated: Secondary | ICD-10-CM | POA: Diagnosis not present

## 2019-05-01 DIAGNOSIS — F1120 Opioid dependence, uncomplicated: Secondary | ICD-10-CM | POA: Diagnosis not present

## 2019-05-01 DIAGNOSIS — F1320 Sedative, hypnotic or anxiolytic dependence, uncomplicated: Secondary | ICD-10-CM | POA: Diagnosis not present

## 2019-05-01 DIAGNOSIS — F1123 Opioid dependence with withdrawal: Secondary | ICD-10-CM | POA: Diagnosis not present

## 2019-05-02 DIAGNOSIS — F13239 Sedative, hypnotic or anxiolytic dependence with withdrawal, unspecified: Secondary | ICD-10-CM | POA: Diagnosis not present

## 2019-05-02 DIAGNOSIS — F1120 Opioid dependence, uncomplicated: Secondary | ICD-10-CM | POA: Diagnosis not present

## 2019-05-02 DIAGNOSIS — F1420 Cocaine dependence, uncomplicated: Secondary | ICD-10-CM | POA: Diagnosis not present

## 2019-05-02 DIAGNOSIS — F1320 Sedative, hypnotic or anxiolytic dependence, uncomplicated: Secondary | ICD-10-CM | POA: Diagnosis not present

## 2019-05-02 DIAGNOSIS — F1123 Opioid dependence with withdrawal: Secondary | ICD-10-CM | POA: Diagnosis not present

## 2019-05-03 DIAGNOSIS — F13239 Sedative, hypnotic or anxiolytic dependence with withdrawal, unspecified: Secondary | ICD-10-CM | POA: Diagnosis not present

## 2019-05-03 DIAGNOSIS — F1320 Sedative, hypnotic or anxiolytic dependence, uncomplicated: Secondary | ICD-10-CM | POA: Diagnosis not present

## 2019-05-03 DIAGNOSIS — F1420 Cocaine dependence, uncomplicated: Secondary | ICD-10-CM | POA: Diagnosis not present

## 2019-05-03 DIAGNOSIS — F1120 Opioid dependence, uncomplicated: Secondary | ICD-10-CM | POA: Diagnosis not present

## 2019-05-03 DIAGNOSIS — F1123 Opioid dependence with withdrawal: Secondary | ICD-10-CM | POA: Diagnosis not present

## 2019-05-04 DIAGNOSIS — F1123 Opioid dependence with withdrawal: Secondary | ICD-10-CM | POA: Diagnosis not present

## 2019-05-04 DIAGNOSIS — F1320 Sedative, hypnotic or anxiolytic dependence, uncomplicated: Secondary | ICD-10-CM | POA: Diagnosis not present

## 2019-05-04 DIAGNOSIS — F13239 Sedative, hypnotic or anxiolytic dependence with withdrawal, unspecified: Secondary | ICD-10-CM | POA: Diagnosis not present

## 2019-05-04 DIAGNOSIS — F1420 Cocaine dependence, uncomplicated: Secondary | ICD-10-CM | POA: Diagnosis not present

## 2019-05-04 DIAGNOSIS — F1120 Opioid dependence, uncomplicated: Secondary | ICD-10-CM | POA: Diagnosis not present

## 2019-05-05 DIAGNOSIS — E861 Hypovolemia: Secondary | ICD-10-CM | POA: Diagnosis not present

## 2019-05-05 DIAGNOSIS — Z03818 Encounter for observation for suspected exposure to other biological agents ruled out: Secondary | ICD-10-CM | POA: Diagnosis not present

## 2019-05-05 DIAGNOSIS — D649 Anemia, unspecified: Secondary | ICD-10-CM | POA: Diagnosis not present

## 2019-05-05 DIAGNOSIS — F17210 Nicotine dependence, cigarettes, uncomplicated: Secondary | ICD-10-CM | POA: Diagnosis not present

## 2019-05-05 DIAGNOSIS — F1420 Cocaine dependence, uncomplicated: Secondary | ICD-10-CM | POA: Diagnosis not present

## 2019-05-05 DIAGNOSIS — F1123 Opioid dependence with withdrawal: Secondary | ICD-10-CM | POA: Diagnosis not present

## 2019-05-05 DIAGNOSIS — E162 Hypoglycemia, unspecified: Secondary | ICD-10-CM | POA: Diagnosis not present

## 2019-05-05 DIAGNOSIS — F1320 Sedative, hypnotic or anxiolytic dependence, uncomplicated: Secondary | ICD-10-CM | POA: Diagnosis not present

## 2019-05-05 DIAGNOSIS — R600 Localized edema: Secondary | ICD-10-CM | POA: Diagnosis not present

## 2019-05-05 DIAGNOSIS — D72829 Elevated white blood cell count, unspecified: Secondary | ICD-10-CM | POA: Diagnosis not present

## 2019-05-05 DIAGNOSIS — F418 Other specified anxiety disorders: Secondary | ICD-10-CM | POA: Diagnosis not present

## 2019-05-05 DIAGNOSIS — R531 Weakness: Secondary | ICD-10-CM | POA: Diagnosis not present

## 2019-05-05 DIAGNOSIS — D473 Essential (hemorrhagic) thrombocythemia: Secondary | ICD-10-CM | POA: Diagnosis not present

## 2019-05-05 DIAGNOSIS — E871 Hypo-osmolality and hyponatremia: Secondary | ICD-10-CM | POA: Diagnosis not present

## 2019-05-05 DIAGNOSIS — F13239 Sedative, hypnotic or anxiolytic dependence with withdrawal, unspecified: Secondary | ICD-10-CM | POA: Diagnosis not present

## 2019-05-05 DIAGNOSIS — F319 Bipolar disorder, unspecified: Secondary | ICD-10-CM | POA: Diagnosis not present

## 2019-05-05 DIAGNOSIS — J449 Chronic obstructive pulmonary disease, unspecified: Secondary | ICD-10-CM | POA: Diagnosis not present

## 2019-05-05 DIAGNOSIS — E876 Hypokalemia: Secondary | ICD-10-CM | POA: Diagnosis not present

## 2019-05-05 DIAGNOSIS — F1120 Opioid dependence, uncomplicated: Secondary | ICD-10-CM | POA: Diagnosis not present

## 2019-05-05 DIAGNOSIS — F1920 Other psychoactive substance dependence, uncomplicated: Secondary | ICD-10-CM | POA: Diagnosis not present

## 2019-05-05 DIAGNOSIS — K219 Gastro-esophageal reflux disease without esophagitis: Secondary | ICD-10-CM | POA: Diagnosis not present

## 2019-05-10 DIAGNOSIS — F1320 Sedative, hypnotic or anxiolytic dependence, uncomplicated: Secondary | ICD-10-CM | POA: Diagnosis not present

## 2019-05-10 DIAGNOSIS — F1123 Opioid dependence with withdrawal: Secondary | ICD-10-CM | POA: Diagnosis not present

## 2019-05-10 DIAGNOSIS — F13239 Sedative, hypnotic or anxiolytic dependence with withdrawal, unspecified: Secondary | ICD-10-CM | POA: Diagnosis not present

## 2019-05-10 DIAGNOSIS — F1120 Opioid dependence, uncomplicated: Secondary | ICD-10-CM | POA: Diagnosis not present

## 2019-05-10 DIAGNOSIS — F1420 Cocaine dependence, uncomplicated: Secondary | ICD-10-CM | POA: Diagnosis not present

## 2019-05-11 DIAGNOSIS — F1320 Sedative, hypnotic or anxiolytic dependence, uncomplicated: Secondary | ICD-10-CM | POA: Diagnosis not present

## 2019-05-11 DIAGNOSIS — F1420 Cocaine dependence, uncomplicated: Secondary | ICD-10-CM | POA: Diagnosis not present

## 2019-05-11 DIAGNOSIS — F13239 Sedative, hypnotic or anxiolytic dependence with withdrawal, unspecified: Secondary | ICD-10-CM | POA: Diagnosis not present

## 2019-05-11 DIAGNOSIS — F1120 Opioid dependence, uncomplicated: Secondary | ICD-10-CM | POA: Diagnosis not present

## 2019-05-11 DIAGNOSIS — F1123 Opioid dependence with withdrawal: Secondary | ICD-10-CM | POA: Diagnosis not present

## 2019-05-12 DIAGNOSIS — F13239 Sedative, hypnotic or anxiolytic dependence with withdrawal, unspecified: Secondary | ICD-10-CM | POA: Diagnosis not present

## 2019-05-12 DIAGNOSIS — F1120 Opioid dependence, uncomplicated: Secondary | ICD-10-CM | POA: Diagnosis not present

## 2019-05-12 DIAGNOSIS — F1420 Cocaine dependence, uncomplicated: Secondary | ICD-10-CM | POA: Diagnosis not present

## 2019-05-12 DIAGNOSIS — F1123 Opioid dependence with withdrawal: Secondary | ICD-10-CM | POA: Diagnosis not present

## 2019-05-12 DIAGNOSIS — F1320 Sedative, hypnotic or anxiolytic dependence, uncomplicated: Secondary | ICD-10-CM | POA: Diagnosis not present

## 2019-05-13 DIAGNOSIS — F13239 Sedative, hypnotic or anxiolytic dependence with withdrawal, unspecified: Secondary | ICD-10-CM | POA: Diagnosis not present

## 2019-05-13 DIAGNOSIS — F1120 Opioid dependence, uncomplicated: Secondary | ICD-10-CM | POA: Diagnosis not present

## 2019-05-13 DIAGNOSIS — F1320 Sedative, hypnotic or anxiolytic dependence, uncomplicated: Secondary | ICD-10-CM | POA: Diagnosis not present

## 2019-05-13 DIAGNOSIS — F1420 Cocaine dependence, uncomplicated: Secondary | ICD-10-CM | POA: Diagnosis not present

## 2019-05-13 DIAGNOSIS — F1123 Opioid dependence with withdrawal: Secondary | ICD-10-CM | POA: Diagnosis not present

## 2019-05-14 DIAGNOSIS — F1420 Cocaine dependence, uncomplicated: Secondary | ICD-10-CM | POA: Diagnosis not present

## 2019-05-14 DIAGNOSIS — F1123 Opioid dependence with withdrawal: Secondary | ICD-10-CM | POA: Diagnosis not present

## 2019-05-14 DIAGNOSIS — F13239 Sedative, hypnotic or anxiolytic dependence with withdrawal, unspecified: Secondary | ICD-10-CM | POA: Diagnosis not present

## 2019-05-14 DIAGNOSIS — F1320 Sedative, hypnotic or anxiolytic dependence, uncomplicated: Secondary | ICD-10-CM | POA: Diagnosis not present

## 2019-05-14 DIAGNOSIS — F1120 Opioid dependence, uncomplicated: Secondary | ICD-10-CM | POA: Diagnosis not present

## 2019-05-15 DIAGNOSIS — F1420 Cocaine dependence, uncomplicated: Secondary | ICD-10-CM | POA: Diagnosis not present

## 2019-05-15 DIAGNOSIS — F1320 Sedative, hypnotic or anxiolytic dependence, uncomplicated: Secondary | ICD-10-CM | POA: Diagnosis not present

## 2019-05-15 DIAGNOSIS — F13239 Sedative, hypnotic or anxiolytic dependence with withdrawal, unspecified: Secondary | ICD-10-CM | POA: Diagnosis not present

## 2019-05-15 DIAGNOSIS — F1120 Opioid dependence, uncomplicated: Secondary | ICD-10-CM | POA: Diagnosis not present

## 2019-05-15 DIAGNOSIS — F1123 Opioid dependence with withdrawal: Secondary | ICD-10-CM | POA: Diagnosis not present

## 2019-05-16 DIAGNOSIS — F1420 Cocaine dependence, uncomplicated: Secondary | ICD-10-CM | POA: Diagnosis not present

## 2019-05-16 DIAGNOSIS — F1120 Opioid dependence, uncomplicated: Secondary | ICD-10-CM | POA: Diagnosis not present

## 2019-05-16 DIAGNOSIS — F13239 Sedative, hypnotic or anxiolytic dependence with withdrawal, unspecified: Secondary | ICD-10-CM | POA: Diagnosis not present

## 2019-05-16 DIAGNOSIS — F1123 Opioid dependence with withdrawal: Secondary | ICD-10-CM | POA: Diagnosis not present

## 2019-05-16 DIAGNOSIS — F1320 Sedative, hypnotic or anxiolytic dependence, uncomplicated: Secondary | ICD-10-CM | POA: Diagnosis not present

## 2019-05-17 DIAGNOSIS — F13239 Sedative, hypnotic or anxiolytic dependence with withdrawal, unspecified: Secondary | ICD-10-CM | POA: Diagnosis not present

## 2019-05-17 DIAGNOSIS — F1123 Opioid dependence with withdrawal: Secondary | ICD-10-CM | POA: Diagnosis not present

## 2019-05-17 DIAGNOSIS — F1320 Sedative, hypnotic or anxiolytic dependence, uncomplicated: Secondary | ICD-10-CM | POA: Diagnosis not present

## 2019-05-17 DIAGNOSIS — F1420 Cocaine dependence, uncomplicated: Secondary | ICD-10-CM | POA: Diagnosis not present

## 2019-05-17 DIAGNOSIS — F1120 Opioid dependence, uncomplicated: Secondary | ICD-10-CM | POA: Diagnosis not present

## 2019-05-18 DIAGNOSIS — F13239 Sedative, hypnotic or anxiolytic dependence with withdrawal, unspecified: Secondary | ICD-10-CM | POA: Diagnosis not present

## 2019-05-18 DIAGNOSIS — F1120 Opioid dependence, uncomplicated: Secondary | ICD-10-CM | POA: Diagnosis not present

## 2019-05-18 DIAGNOSIS — F1420 Cocaine dependence, uncomplicated: Secondary | ICD-10-CM | POA: Diagnosis not present

## 2019-05-18 DIAGNOSIS — F1123 Opioid dependence with withdrawal: Secondary | ICD-10-CM | POA: Diagnosis not present

## 2019-05-18 DIAGNOSIS — F1320 Sedative, hypnotic or anxiolytic dependence, uncomplicated: Secondary | ICD-10-CM | POA: Diagnosis not present

## 2019-05-19 DIAGNOSIS — F1123 Opioid dependence with withdrawal: Secondary | ICD-10-CM | POA: Diagnosis not present

## 2019-05-19 DIAGNOSIS — F1420 Cocaine dependence, uncomplicated: Secondary | ICD-10-CM | POA: Diagnosis not present

## 2019-05-19 DIAGNOSIS — F1120 Opioid dependence, uncomplicated: Secondary | ICD-10-CM | POA: Diagnosis not present

## 2019-05-19 DIAGNOSIS — F13239 Sedative, hypnotic or anxiolytic dependence with withdrawal, unspecified: Secondary | ICD-10-CM | POA: Diagnosis not present

## 2019-05-19 DIAGNOSIS — F1320 Sedative, hypnotic or anxiolytic dependence, uncomplicated: Secondary | ICD-10-CM | POA: Diagnosis not present

## 2019-05-20 DIAGNOSIS — F1420 Cocaine dependence, uncomplicated: Secondary | ICD-10-CM | POA: Diagnosis not present

## 2019-05-20 DIAGNOSIS — F1123 Opioid dependence with withdrawal: Secondary | ICD-10-CM | POA: Diagnosis not present

## 2019-05-20 DIAGNOSIS — F13239 Sedative, hypnotic or anxiolytic dependence with withdrawal, unspecified: Secondary | ICD-10-CM | POA: Diagnosis not present

## 2019-05-20 DIAGNOSIS — F1120 Opioid dependence, uncomplicated: Secondary | ICD-10-CM | POA: Diagnosis not present

## 2019-05-20 DIAGNOSIS — F1320 Sedative, hypnotic or anxiolytic dependence, uncomplicated: Secondary | ICD-10-CM | POA: Diagnosis not present

## 2019-05-21 DIAGNOSIS — F1120 Opioid dependence, uncomplicated: Secondary | ICD-10-CM | POA: Diagnosis not present

## 2019-05-21 DIAGNOSIS — F13239 Sedative, hypnotic or anxiolytic dependence with withdrawal, unspecified: Secondary | ICD-10-CM | POA: Diagnosis not present

## 2019-05-21 DIAGNOSIS — F1320 Sedative, hypnotic or anxiolytic dependence, uncomplicated: Secondary | ICD-10-CM | POA: Diagnosis not present

## 2019-05-21 DIAGNOSIS — F1420 Cocaine dependence, uncomplicated: Secondary | ICD-10-CM | POA: Diagnosis not present

## 2019-05-21 DIAGNOSIS — F1123 Opioid dependence with withdrawal: Secondary | ICD-10-CM | POA: Diagnosis not present

## 2019-05-22 ENCOUNTER — Ambulatory Visit: Payer: Medicare HMO | Admitting: Family Medicine

## 2019-05-22 DIAGNOSIS — F1123 Opioid dependence with withdrawal: Secondary | ICD-10-CM | POA: Diagnosis not present

## 2019-05-22 DIAGNOSIS — F1120 Opioid dependence, uncomplicated: Secondary | ICD-10-CM | POA: Diagnosis not present

## 2019-05-22 DIAGNOSIS — Z0289 Encounter for other administrative examinations: Secondary | ICD-10-CM

## 2019-05-22 DIAGNOSIS — F1320 Sedative, hypnotic or anxiolytic dependence, uncomplicated: Secondary | ICD-10-CM | POA: Diagnosis not present

## 2019-05-22 DIAGNOSIS — F1420 Cocaine dependence, uncomplicated: Secondary | ICD-10-CM | POA: Diagnosis not present

## 2019-05-22 DIAGNOSIS — F13239 Sedative, hypnotic or anxiolytic dependence with withdrawal, unspecified: Secondary | ICD-10-CM | POA: Diagnosis not present

## 2019-05-23 DIAGNOSIS — S52621A Torus fracture of lower end of right ulna, initial encounter for closed fracture: Secondary | ICD-10-CM | POA: Diagnosis not present

## 2019-05-23 DIAGNOSIS — Z789 Other specified health status: Secondary | ICD-10-CM | POA: Diagnosis not present

## 2019-05-23 DIAGNOSIS — F319 Bipolar disorder, unspecified: Secondary | ICD-10-CM | POA: Diagnosis not present

## 2019-05-23 DIAGNOSIS — F1123 Opioid dependence with withdrawal: Secondary | ICD-10-CM | POA: Diagnosis not present

## 2019-05-23 DIAGNOSIS — F1320 Sedative, hypnotic or anxiolytic dependence, uncomplicated: Secondary | ICD-10-CM | POA: Diagnosis not present

## 2019-05-23 DIAGNOSIS — S52531A Colles' fracture of right radius, initial encounter for closed fracture: Secondary | ICD-10-CM | POA: Diagnosis not present

## 2019-05-23 DIAGNOSIS — F1120 Opioid dependence, uncomplicated: Secondary | ICD-10-CM | POA: Diagnosis not present

## 2019-05-23 DIAGNOSIS — F1420 Cocaine dependence, uncomplicated: Secondary | ICD-10-CM | POA: Diagnosis not present

## 2019-05-23 DIAGNOSIS — M25532 Pain in left wrist: Secondary | ICD-10-CM | POA: Diagnosis not present

## 2019-05-23 DIAGNOSIS — F13239 Sedative, hypnotic or anxiolytic dependence with withdrawal, unspecified: Secondary | ICD-10-CM | POA: Diagnosis not present

## 2019-05-24 DIAGNOSIS — F1123 Opioid dependence with withdrawal: Secondary | ICD-10-CM | POA: Diagnosis not present

## 2019-05-24 DIAGNOSIS — F1420 Cocaine dependence, uncomplicated: Secondary | ICD-10-CM | POA: Diagnosis not present

## 2019-05-24 DIAGNOSIS — F1120 Opioid dependence, uncomplicated: Secondary | ICD-10-CM | POA: Diagnosis not present

## 2019-05-24 DIAGNOSIS — F13239 Sedative, hypnotic or anxiolytic dependence with withdrawal, unspecified: Secondary | ICD-10-CM | POA: Diagnosis not present

## 2019-05-24 DIAGNOSIS — F1320 Sedative, hypnotic or anxiolytic dependence, uncomplicated: Secondary | ICD-10-CM | POA: Diagnosis not present

## 2019-05-25 DIAGNOSIS — F1123 Opioid dependence with withdrawal: Secondary | ICD-10-CM | POA: Diagnosis not present

## 2019-05-25 DIAGNOSIS — F13239 Sedative, hypnotic or anxiolytic dependence with withdrawal, unspecified: Secondary | ICD-10-CM | POA: Diagnosis not present

## 2019-05-25 DIAGNOSIS — F1120 Opioid dependence, uncomplicated: Secondary | ICD-10-CM | POA: Diagnosis not present

## 2019-05-25 DIAGNOSIS — F1320 Sedative, hypnotic or anxiolytic dependence, uncomplicated: Secondary | ICD-10-CM | POA: Diagnosis not present

## 2019-05-25 DIAGNOSIS — F1420 Cocaine dependence, uncomplicated: Secondary | ICD-10-CM | POA: Diagnosis not present

## 2019-05-26 DIAGNOSIS — F1120 Opioid dependence, uncomplicated: Secondary | ICD-10-CM | POA: Diagnosis not present

## 2019-05-26 DIAGNOSIS — F1320 Sedative, hypnotic or anxiolytic dependence, uncomplicated: Secondary | ICD-10-CM | POA: Diagnosis not present

## 2019-05-26 DIAGNOSIS — F13239 Sedative, hypnotic or anxiolytic dependence with withdrawal, unspecified: Secondary | ICD-10-CM | POA: Diagnosis not present

## 2019-05-26 DIAGNOSIS — F1123 Opioid dependence with withdrawal: Secondary | ICD-10-CM | POA: Diagnosis not present

## 2019-05-26 DIAGNOSIS — F1420 Cocaine dependence, uncomplicated: Secondary | ICD-10-CM | POA: Diagnosis not present

## 2019-05-27 DIAGNOSIS — F1420 Cocaine dependence, uncomplicated: Secondary | ICD-10-CM | POA: Diagnosis not present

## 2019-05-27 DIAGNOSIS — F13239 Sedative, hypnotic or anxiolytic dependence with withdrawal, unspecified: Secondary | ICD-10-CM | POA: Diagnosis not present

## 2019-05-27 DIAGNOSIS — F1120 Opioid dependence, uncomplicated: Secondary | ICD-10-CM | POA: Diagnosis not present

## 2019-05-27 DIAGNOSIS — F1320 Sedative, hypnotic or anxiolytic dependence, uncomplicated: Secondary | ICD-10-CM | POA: Diagnosis not present

## 2019-05-27 DIAGNOSIS — F1123 Opioid dependence with withdrawal: Secondary | ICD-10-CM | POA: Diagnosis not present

## 2019-06-01 ENCOUNTER — Encounter: Payer: Self-pay | Admitting: Family Medicine

## 2019-06-01 ENCOUNTER — Other Ambulatory Visit: Payer: Self-pay

## 2019-06-01 ENCOUNTER — Ambulatory Visit (INDEPENDENT_AMBULATORY_CARE_PROVIDER_SITE_OTHER): Payer: Medicare HMO | Admitting: Family Medicine

## 2019-06-01 VITALS — Ht 62.0 in | Wt 120.0 lb

## 2019-06-01 DIAGNOSIS — M810 Age-related osteoporosis without current pathological fracture: Secondary | ICD-10-CM | POA: Diagnosis not present

## 2019-06-01 DIAGNOSIS — S76011D Strain of muscle, fascia and tendon of right hip, subsequent encounter: Secondary | ICD-10-CM

## 2019-06-01 DIAGNOSIS — Z9189 Other specified personal risk factors, not elsewhere classified: Secondary | ICD-10-CM

## 2019-06-01 DIAGNOSIS — S52502A Unspecified fracture of the lower end of left radius, initial encounter for closed fracture: Secondary | ICD-10-CM | POA: Diagnosis not present

## 2019-06-01 DIAGNOSIS — E871 Hypo-osmolality and hyponatremia: Secondary | ICD-10-CM | POA: Diagnosis not present

## 2019-06-01 DIAGNOSIS — F319 Bipolar disorder, unspecified: Secondary | ICD-10-CM

## 2019-06-01 DIAGNOSIS — F1421 Cocaine dependence, in remission: Secondary | ICD-10-CM | POA: Diagnosis not present

## 2019-06-01 DIAGNOSIS — R112 Nausea with vomiting, unspecified: Secondary | ICD-10-CM | POA: Diagnosis not present

## 2019-06-01 DIAGNOSIS — Z720 Tobacco use: Secondary | ICD-10-CM

## 2019-06-01 DIAGNOSIS — K2101 Gastro-esophageal reflux disease with esophagitis, with bleeding: Secondary | ICD-10-CM

## 2019-06-01 DIAGNOSIS — R911 Solitary pulmonary nodule: Secondary | ICD-10-CM

## 2019-06-01 DIAGNOSIS — J449 Chronic obstructive pulmonary disease, unspecified: Secondary | ICD-10-CM | POA: Diagnosis not present

## 2019-06-01 DIAGNOSIS — S52602A Unspecified fracture of lower end of left ulna, initial encounter for closed fracture: Secondary | ICD-10-CM

## 2019-06-01 MED ORDER — DEXLANSOPRAZOLE 60 MG PO CPDR: 60.0000 mg | DELAYED_RELEASE_CAPSULE | Freq: Every day | ORAL | 6 refills | Status: DC

## 2019-06-01 MED ORDER — VITAMIN D 50 MCG (2000 UT) PO CAPS: 1.0000 | ORAL_CAPSULE | Freq: Every day | ORAL | 11 refills | Status: DC

## 2019-06-01 NOTE — Assessment & Plan Note (Signed)
Sees [facility] Thayer Headings NP, no upcoming appt planned. Med list updated based on what she's taking. She continues seroquel 800mg  nightly and does not want to lower dose, aware of possible cause of chronic hyponatremia. No longer on buspar, not currently taking hydroxyzine but wants to keep as PRN. Encouraged f/u, but she has difficulty affording this.

## 2019-06-01 NOTE — Assessment & Plan Note (Addendum)
H/o polysubstance abuse (cocaine and more recently opiates) - now on suboxone, seems this was started at latest rehab at [facility].  Will refer to local drug rehab.

## 2019-06-01 NOTE — Assessment & Plan Note (Signed)
Severe. Unable to afford GI f/u. From prior note, plan was trial dexilant - will send this for pt to price out in place of protonix. Continue PRN carafate.

## 2019-06-01 NOTE — Assessment & Plan Note (Signed)
Cast in place. Continue ortho f/u.

## 2019-06-01 NOTE — Assessment & Plan Note (Signed)
Continue spiriva. Encouraged smoking cessation.

## 2019-06-01 NOTE — Assessment & Plan Note (Addendum)
Attributed to dehydration, vomiting, likely meds contributing. She does not want to change seroquel dosage. Encouraged psych f/u when she can afford this. I have asked her to return next week for rpt labs (electrolytes)

## 2019-06-01 NOTE — Assessment & Plan Note (Signed)
Has not been taking calcium or vit D - advised restart this.

## 2019-06-01 NOTE — Assessment & Plan Note (Signed)
Reviewed recent CT findings with patient - will scan into chart. Recommend rpt CT lungs in 6 months.

## 2019-06-01 NOTE — Assessment & Plan Note (Signed)
Continued smoker. Not at a place to attempt cessation at this time.

## 2019-06-01 NOTE — Assessment & Plan Note (Signed)
Severe esophagitis by EGD, recent CT showing HH as well as marked esophageal thickening, was unable to afford f/u with GI. Will continue carafate PRN and change PPI to dexilant (she will price out).
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
## 2019-06-05 ENCOUNTER — Other Ambulatory Visit: Payer: Medicare HMO

## 2019-06-06 DIAGNOSIS — Z20828 Contact with and (suspected) exposure to other viral communicable diseases: Secondary | ICD-10-CM | POA: Diagnosis not present

## 2019-06-07 ENCOUNTER — Telehealth: Payer: Self-pay

## 2019-06-07 DIAGNOSIS — I951 Orthostatic hypotension: Secondary | ICD-10-CM | POA: Diagnosis not present

## 2019-06-07 DIAGNOSIS — Z20828 Contact with and (suspected) exposure to other viral communicable diseases: Secondary | ICD-10-CM | POA: Diagnosis not present

## 2019-06-07 DIAGNOSIS — R600 Localized edema: Secondary | ICD-10-CM | POA: Diagnosis not present

## 2019-06-07 NOTE — Telephone Encounter (Addendum)
Pt left v/m;For 3 wks pt has been retaining fluid in legs; when wears socks there is a deep indention in legs. Shin hurts. I tried callilng pt back and no answer and v/m box is full. Pt called back; pt was recently in a facility and thinks she may have been exposed to covid because no one wore a mask. Pt went to [facility] on 06/06/19 for covid testing and has not got report back yet. Pt has no covid symptoms,and no travel. When pt was at [facility] was told BP was low and heart rate was high but pt does not remember values. Pt has been having swelling in lower legs and feet for 3 wks and did not mention when at [facility] on 06/06/19. Both shins hurt; no redness on legs.swelling of lower legs does not go down overnight.  No SOB or CP. No H/A. Pt gets lightheaded on and off but room does not spin around. Today pt has felt like she was going to black out but she has not blacked out yet. Pt has been vomiting a lot in last few days. Pt will go to [facility] on [address]. FYI to Dr Darnell Level.

## 2019-06-08 NOTE — Telephone Encounter (Signed)
Spoke with pt relaying Dr. Synthia Innocent message.  Pt verbalizes understanding.  States the leg swelling is improving.  Scheduled lab visit on 06/15/19 at 10:10.  Fyi to Dr. Darnell Level.
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
## 2019-06-15 ENCOUNTER — Encounter: Payer: Self-pay | Admitting: Family Medicine

## 2019-06-15 ENCOUNTER — Other Ambulatory Visit: Payer: Medicare HMO

## 2019-06-16 DIAGNOSIS — R52 Pain, unspecified: Secondary | ICD-10-CM | POA: Diagnosis not present

## 2019-06-16 DIAGNOSIS — R110 Nausea: Secondary | ICD-10-CM | POA: Diagnosis not present

## 2019-06-16 DIAGNOSIS — R519 Headache, unspecified: Secondary | ICD-10-CM | POA: Diagnosis not present

## 2019-06-16 DIAGNOSIS — F319 Bipolar disorder, unspecified: Secondary | ICD-10-CM | POA: Diagnosis not present

## 2019-06-16 DIAGNOSIS — Z20828 Contact with and (suspected) exposure to other viral communicable diseases: Secondary | ICD-10-CM | POA: Diagnosis not present

## 2019-06-19 ENCOUNTER — Telehealth: Payer: Self-pay | Admitting: Family Medicine

## 2019-06-19 NOTE — Telephone Encounter (Signed)
Patient called today confused about an appointment, She stated she has an appointment Wednesday to have a cast removed,. She stated we referred her to that office but patient stated she has no idea where her appointment is at or the name of the office  Do you happen to know?

## 2019-06-19 NOTE — Telephone Encounter (Signed)
Patient called back. She found the place she is suppose to go and phone number

## 2019-06-19 NOTE — Telephone Encounter (Signed)
Charmaine, can you find out when, where and phone # for pt's appt with ortho and let her know?  I think it's with [facility] on [address].  I

## 2019-06-20 ENCOUNTER — Telehealth: Payer: Self-pay

## 2019-06-20 NOTE — Telephone Encounter (Signed)
[place] Night - Client Nonclinical Telephone Record AccessNurse Client [place] Night - Client Client Site [place] Physician Ria Bush - MD Contact Type Call Who Is Calling Patient / Member / Family / Caregiver Caller Name [place] Phone Number [phone] Patient Name Robin Welch Patient DOB 1965-11-22 Call Type Message Only Information Provided Reason for Call Request to Schedule Office Appointment Initial Comment Caller states has appt for her arm today to have it checked; Had surgery on it and needs to know where the appt is. wants appt, off balance, and falling, feels not with it. Additional Comment Caller declined triage. Adv no access to appts after hours. Call Closed By: Jerrye Beavers Transaction Date/Time: 06/20/2019 7:41:45 AM (ET)
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
## 2019-06-20 NOTE — Telephone Encounter (Signed)
I tried to call pt and no answer and v/m box is full.

## 2019-06-20 NOTE — Telephone Encounter (Signed)
[place] Night - Client Nonclinical Telephone Record AccessNurse Client [place] Night - Client Client Site [place] Physician Ria Bush - MD Contact Type Call Who Is Calling Patient / Member / Family / Caregiver Caller Name [place] Phone Number [phone] Patient Name Robin Welch Patient DOB Feb 24, 1966 Call Type Message Only Information Provided Reason for Call Request for General Office Information Initial Comment Caller is needing to get info about her cast getting cut off. Additional Comment Call Closed By: Silvano Rusk Transaction Date/Time: 06/20/2019 7:20:42 AM (ET)

## 2019-06-21 ENCOUNTER — Emergency Department (HOSPITAL_COMMUNITY): Payer: Medicare HMO

## 2019-06-21 ENCOUNTER — Other Ambulatory Visit: Payer: Self-pay

## 2019-06-21 ENCOUNTER — Encounter (HOSPITAL_COMMUNITY): Payer: Self-pay

## 2019-06-21 ENCOUNTER — Emergency Department (HOSPITAL_COMMUNITY)
Admission: EM | Admit: 2019-06-21 | Discharge: 2019-06-21 | Disposition: A | Discharge status: Home / Self Care | Payer: Medicare HMO | Attending: Emergency Medicine | Admitting: Emergency Medicine

## 2019-06-21 DIAGNOSIS — J449 Chronic obstructive pulmonary disease, unspecified: Secondary | ICD-10-CM | POA: Insufficient documentation

## 2019-06-21 DIAGNOSIS — Z79899 Other long term (current) drug therapy: Secondary | ICD-10-CM | POA: Diagnosis not present

## 2019-06-21 DIAGNOSIS — F1410 Cocaine abuse, uncomplicated: Secondary | ICD-10-CM | POA: Diagnosis not present

## 2019-06-21 DIAGNOSIS — R296 Repeated falls: Secondary | ICD-10-CM | POA: Insufficient documentation

## 2019-06-21 DIAGNOSIS — K5900 Constipation, unspecified: Secondary | ICD-10-CM | POA: Diagnosis not present

## 2019-06-21 DIAGNOSIS — R569 Unspecified convulsions: Secondary | ICD-10-CM | POA: Diagnosis not present

## 2019-06-21 DIAGNOSIS — F17210 Nicotine dependence, cigarettes, uncomplicated: Secondary | ICD-10-CM | POA: Diagnosis not present

## 2019-06-21 DIAGNOSIS — R109 Unspecified abdominal pain: Secondary | ICD-10-CM | POA: Diagnosis not present

## 2019-06-21 LAB — COMPREHENSIVE METABOLIC PANEL
ALT: 17 U/L (ref 0–44)
AST: 40 U/L (ref 15–41)
Albumin: 1.6 g/dL — ABNORMAL LOW (ref 3.5–5.0)
Alkaline Phosphatase: 116 U/L (ref 38–126)
Anion gap: 10 (ref 5–15)
BUN: 5 mg/dL — ABNORMAL LOW (ref 6–20)
CO2: 19 mmol/L — ABNORMAL LOW (ref 22–32)
Calcium: 7.1 mg/dL — ABNORMAL LOW (ref 8.9–10.3)
Chloride: 101 mmol/L (ref 98–111)
Creatinine, Ser: 0.56 mg/dL (ref 0.44–1.00)
GFR calc Af Amer: >60 mL/min (ref >60)
GFR calc non Af Amer: >60 mL/min (ref >60)
Glucose, Bld: 83 mg/dL (ref 70–99)
Potassium: 4.2 mmol/L (ref 3.5–5.1)
Sodium: 130 mmol/L — ABNORMAL LOW (ref 135–145)
Total Bilirubin: 0.9 mg/dL (ref 0.3–1.2)
Total Protein: 4.2 g/dL — ABNORMAL LOW (ref 6.5–8.1)

## 2019-06-21 LAB — URINALYSIS, ROUTINE W REFLEX MICROSCOPIC
Bilirubin Urine: NEGATIVE
Glucose, UA: NEGATIVE mg/dL
Hgb urine dipstick: NEGATIVE
Ketones, ur: NEGATIVE mg/dL
Leukocytes,Ua: NEGATIVE
Nitrite: NEGATIVE
Protein, ur: NEGATIVE mg/dL
Specific Gravity, Urine: 1.002 — ABNORMAL LOW (ref 1.005–1.030)
pH: 6 (ref 5.0–8.0)

## 2019-06-21 LAB — CBC
HCT: 37.5 % (ref 36.0–46.0)
Hemoglobin: 12.1 g/dL (ref 12.0–15.0)
MCH: 29.4 pg (ref 26.0–34.0)
MCHC: 32.3 g/dL (ref 30.0–36.0)
MCV: 91.2 fL (ref 80.0–100.0)
Platelets: 513 10*3/uL — ABNORMAL HIGH (ref 150–400)
RBC: 4.11 MIL/uL (ref 3.87–5.11)
RDW: 17.3 % — ABNORMAL HIGH (ref 11.5–15.5)
WBC: 8 10*3/uL (ref 4.0–10.5)
nRBC: 0 % (ref 0.0–0.2)

## 2019-06-21 LAB — LIPASE, BLOOD: Lipase: 58 U/L — ABNORMAL HIGH (ref 11–51)

## 2019-06-21 LAB — ETHANOL: Alcohol, Ethyl (B): <10 mg/dL (ref <10)

## 2019-06-21 MED ORDER — SODIUM CHLORIDE 0.9 % IV SOLN: 1000.0000 mL | INTRAVENOUS | Status: DC

## 2019-06-21 MED ORDER — SODIUM CHLORIDE 0.9 % IV BOLUS (SEPSIS)
1000.0000 mL | Freq: Once | INTRAVENOUS | Status: AC
  Administered 2019-06-21: 1000 mL via INTRAVENOUS

## 2019-06-21 MED ORDER — SODIUM CHLORIDE 0.9% FLUSH: 3.0000 mL | Freq: Once | INTRAVENOUS | Status: DC

## 2019-06-21 MED ORDER — POLYETHYLENE GLYCOL 3350 17 G PO PACK: 17.0000 g | PACK | Freq: Every day | ORAL | 1 refills | Status: DC

## 2019-06-21 NOTE — Discharge Instructions (Addendum)
MRI brain without any acute findings.  CT head without any acute findings.  X-rays raise some questions for constipation.  Take the MiraLAX as directed.  You can also take it more than once a day.  Make an appointment to follow-up with your doctor.

## 2019-06-21 NOTE — ED Provider Notes (Signed)
Patient's CT head without any acute findings so MRI brain was ordered for her ataxia.  Not evident on exam however.  MRI brain without any acute findings.  Plain x-rays raise some concerns for constipation.  The patient be started on MiraLAX.  She will follow back up with her primary care doctor.  Patient stable for discharge home.   Fredia Sorrow, MD 06/21/19 [phone]

## 2019-06-21 NOTE — ED Notes (Signed)
Patient transported to MRI 

## 2019-06-21 NOTE — ED Provider Notes (Signed)
[facility] DEPT Provider Note   CSN: [id number] Arrival date & time: 06/21/19  1255     History   Chief Complaint Chief Complaint  Patient presents with  . Fall  .   Marland Kitchen Constipation  . Abdominal Pain    HPI Robin Welch is a 53 y.o. female.     HPI Patient presents to the emergency room for evaluation of frequent falls and constipation.  Patient states that she has been falling frequently over the past week.  She feels very unsteady on her feet.  The patient states she tried to talk to her doctor today but they were not seeing anyone in the office so they instructed her to come to the ED.  Patient denies any headache.  No trouble with her speech.  No difficulty moving her extremities.  Patient also states she has been very constipated.  She has not had normal bowel movement in at least 8 to 12 weeks.  She feels like her abdomen is bloated and tender.  She denies any nausea or vomiting.  No dysuria.  Patient does admit to almost daily cocaine use.  She denies any other drugs or alcohol use. Past Medical History:  Diagnosis Date  . Anxiety   . Barrett esophagus   . Bipolar 1 disorder [facility])    sees Dr. Candis Schatz psych [phone])  . Closed fracture distal radius and ulna, left, initial encounter 04/20/2019   S/p ORIF 04/2019 [facility])   . COPD (chronic obstructive pulmonary disease) ([place]) 03/2013   hyperinflation by CXR  . History of cocaine dependence ([place]) latest 2017   undergoing detox  . History of stomach ulcers   . Hx: UTI (urinary tract infection)   . Migraine    "stopped in ~ 2008" (03/11/2017)  . Muscle strain of gluteal region, right, subsequent encounter 10/05/2018   By MRI 2020  . Osteoporosis 06/09/2018   DEXA 05/2017 - T score -2.5 L hip, -1.3 spine  . Osteoporosis   . Positive PPD 1989   s/p CXR and INH 6 mo  . Seizure ([place]) X 1   "from takiing too many headache medicine?" (03/11/2017)  . Smoker     Patient Active Problem  List   Diagnosis Date Noted  . Pulmonary nodule less than 1 cm in diameter with moderate to high risk for malignant neoplasm 06/01/2019  . Closed fracture distal radius and ulna, right, initial encounter 04/20/2019  . Diarrhea of presumed infectious origin 01/31/2019  . GAD (generalized anxiety disorder) 09/02/2018  . Insomnia 09/02/2018  . Low serum vitamin B12 07/19/2018  . Memory loss 06/20/2018  . Closed nondisplaced fracture of third metatarsal bone of left foot 06/13/2018  . Osteoporosis 06/09/2018  . Chronic constipation 04/19/2018  . Gastroesophageal reflux disease 04/19/2018  . Hematemesis 04/19/2018  . Thrush 06/21/2017  . Chronic hyponatremia 03/11/2017  . Iron deficiency anemia 03/11/2017  . Thrombocytosis ([place]) 03/02/2017  . Non-intractable vomiting with nausea 03/01/2017  . History of cocaine dependence ([place])   . Migraine 05/08/2015  . Hoarseness of voice 04/04/2015  . COPD (chronic obstructive pulmonary disease) with chronic bronchitis ([place]) 09/22/2013  . Tobacco abuse 12/18/2010  . Bipolar 1 disorder ([place]) 12/18/2010    Past Surgical History:  Procedure Laterality Date  . COLONOSCOPY  07/2018   poor colon prep - rec repeat ([facility])  . DILATION AND CURETTAGE OF UTERUS  X 1   S/P miscarriage  . ESOPHAGOGASTRODUODENOSCOPY  03/2017   candidal esophagitis, treated, LA grade  D esophagitis, 4cm HH ([facility])  . ESOPHAGOGASTRODUODENOSCOPY  07/2018   severe reflux esophagitis, neg candida ([facility])  . LAPAROSCOPIC CHOLECYSTECTOMY  2006  . ORIF WRIST FRACTURE Right 04/08/2019   Procedure: OPEN REDUCTION INTERNAL FIXATION (ORIF) DISTAL RADIUS/ULNA FRACTURE;  Surgeon: Verner Mould, MD;  Location: [facility];  Service: Orthopedics;  Laterality: Right;  . TUBAL LIGATION  2003     OB History   No obstetric history on file.      Home Medications    Prior to Admission medications   Medication Sig Start Date End Date Taking? Authorizing Provider  acetaminophen  (TYLENOL) 500 MG tablet Take 1,000 mg by mouth every 6 (six) hours as needed for mild pain or moderate pain.    [provider]  albuterol (VENTOLIN HFA) 108 (90 Base) MCG/ACT inhaler Inhale 1-2 puffs into the lungs every 6 (six) hours as needed for wheezing or shortness of breath. 06/29/18   Recardo Evangelist, PA-C  buprenorphine-naloxone (SUBOXONE) 8-2 mg SUBL SL tablet Place 1 tablet under the tongue daily.    [provider]  Calcium 500 MG CHEW Chew 1 tablet (500 mg total) by mouth 2 (two) times daily. Patient not taking: Reported on 06/01/2019 06/13/18   Ria Bush, MD  Cholecalciferol (VITAMIN D) 50 MCG (2000 UT) CAPS Take 1 capsule (2,000 Units total) by mouth daily. 06/01/19   Ria Bush, MD  dexlansoprazole (DEXILANT) 60 MG capsule Take 1 capsule (60 mg total) by mouth daily. 06/01/19   Ria Bush, MD  hydrOXYzine (ATARAX/VISTARIL) 25 MG tablet Take 2 tablets (50 mg total) by mouth 3 (three) times daily. Patient not taking: Reported on 06/01/2019 06/29/18   Recardo Evangelist, PA-C  naproxen sodium (ALEVE) 220 MG tablet Take 220-440 mg by mouth daily as needed (pain).     [provider]  ondansetron (ZOFRAN-ODT) 4 MG disintegrating tablet Take 1 tablet (4 mg total) by mouth every 8 (eight) hours as needed for nausea or vomiting. 04/12/19   Ria Bush, MD  QUEtiapine (SEROQUEL) 400 MG tablet Take 2 tablets (800 mg total) by mouth at bedtime. 11/15/18 06/01/19  Thayer Headings, PMHNP  sucralfate (CARAFATE) 1 GM/10ML suspension Take 10 mLs (1 g total) by mouth 4 (four) times daily. Patient not taking: Reported on 06/01/2019 12/19/18   Mauri Pole, MD  Tiotropium Bromide Monohydrate (SPIRIVA RESPIMAT) 2.5 MCG/ACT AERS Inhale 1 puff into the lungs daily. 03/24/19   Ria Bush, MD    Family History Family History  Problem Relation Age of Onset  . Diabetes Mother        T2DM  . Breast cancer Mother 61  . Dementia Mother   .  Lymphoma Mother   . Coronary artery disease Father 1       MI  . Anxiety disorder Paternal Aunt   . Anxiety disorder Cousin   . Depression Cousin   . ADD / ADHD Child   . Anxiety disorder Child   . Depression Child   . Stroke Neg Hx   . Colon cancer Neg Hx   . Cancer - Colon Neg Hx     Social History Social History   Tobacco Use  . Smoking status: Current Every Day Smoker    Packs/day: 1.00    Years: 36.00    Pack years: 36.00    Types: Cigarettes  . Smokeless tobacco: Never Used  Substance Use Topics  . Alcohol use: Not Currently    Alcohol/week: 0.0 standard drinks  Comment: 03/11/2017 "stopped 09/18/2015; never an alcoholic"  . Drug use: Yes    Types: Cocaine    Comment: patient sttes daily use     Allergies   Prozac [fluoxetine hcl], Doxycycline, Celexa [citalopram hydrobromide], Chantix [varenicline tartrate], Hydroxyzine, Klonopin [clonazepam], Lunesta [eszopiclone], Prozac [fluoxetine hcl], Sonata [zaleplon], Trazodone and nefazodone, Zolpimist [zolpidem tartrate], and Azithromycin   Review of Systems Review of Systems  All other systems reviewed and are negative.    Physical Exam Updated Vital Signs BP 103/81   Pulse 90   Temp 97.8 F (36.6 C) (Oral)   Resp 18   Ht 1.575 m (5\' 2" )   Wt 54.4 kg   LMP 04/23/2015 (Approximate)   SpO2 99%   BMI 21.95 kg/m   Physical Exam Vitals signs and nursing note reviewed.  Constitutional:      General: She is not in acute distress.    Appearance: She is well-developed.  HENT:     Head: Normocephalic and atraumatic.     Right Ear: External ear normal.     Left Ear: External ear normal.  Eyes:     General: No scleral icterus.       Right eye: No discharge.        Left eye: No discharge.     Conjunctiva/sclera: Conjunctivae normal.  Neck:     Musculoskeletal: Neck supple.     Trachea: No tracheal deviation.  Cardiovascular:     Rate and Rhythm: Normal rate and regular rhythm.  Pulmonary:     Effort:  Pulmonary effort is normal. No respiratory distress.     Breath sounds: Normal breath sounds. No stridor. No wheezing or rales.  Abdominal:     General: Bowel sounds are normal. There is no distension.     Palpations: Abdomen is soft.     Tenderness: There is generalized abdominal tenderness. There is no guarding or rebound.     Hernia: No hernia is present.     Comments: Abdomen is soft, not protuberant no contusions  Genitourinary:    Comments: Rectal exam without signs of obstruction, no stool in the rectal vault, no fecal impaction Musculoskeletal:        General: No tenderness.  Skin:    General: Skin is warm and dry.     Findings: No rash.  Neurological:     Mental Status: She is alert and oriented to person, place, and time.     Cranial Nerves: No cranial nerve deficit (No facial droop, extraocular movements intact, tongue midline ).     Sensory: No sensory deficit.     Motor: Tremor present. No abnormal muscle tone or seizure activity.     Coordination: Coordination normal.     Comments: No pronator drift bilateral upper extrem, able to hold both legs off bed for 5 seconds, sensation intact in all extremities, no visual field cuts, no left or right sided neglect, normal finger-nose exam bilaterally, no nystagmus noted       ED Treatments / Results  Labs (all labs ordered are listed, but only abnormal results are displayed) Labs Reviewed  CBC - Abnormal; Notable for the following components:      Result Value   RDW 17.3 (*)    Platelets 513 (*)    All other components within normal limits  URINALYSIS, ROUTINE W REFLEX MICROSCOPIC  COMPREHENSIVE METABOLIC PANEL  LIPASE, BLOOD  ETHANOL    EKG None  Radiology Dg Abd 2 Views  Result Date: 06/21/2019 CLINICAL DATA:  Abdominal  pain and recent falls EXAM: ABDOMEN - 2 VIEW COMPARISON:  None. FINDINGS: Scattered large and small bowel gas is noted. Retained fecal material consistent with a degree of constipation is  noted. No impaction is seen. Postsurgical changes are noted in the right upper quadrant. No free air is seen. No bony abnormality is noted. IMPRESSION: Changes of constipation. Electronically Signed   By: Inez Catalina M.D.   On: 06/21/2019 15:19    Procedures Procedures (including critical care time)  Medications Ordered in ED Medications  sodium chloride flush (NS) 0.9 % injection 3 mL (0 mLs Intravenous Hold 06/21/19 1359)  sodium chloride 0.9 % bolus 1,000 mL (1,000 mLs Intravenous New Bag/Given 06/21/19 1452)    Followed by  0.9 %  sodium chloride infusion (has no administration in time range)     Initial Impression / Assessment and Plan / ED Course  I have reviewed the triage vital signs and the nursing notes.  Pertinent labs & imaging results that were available during my care of the patient were reviewed by me and considered in my medical decision making (see chart for details).   Pt complains of difficulty with her balance and frequent falls.  Admits to frequent cocaine use.  Tremor noted on exam but otherwise neuro exam unremarkable.  Also complains of constipation, abd pain.  Xray shows a degree of constipation.  NO fecal impaction on exam.  Doubt colitis, diverticulitis. WIll check labs.  Consider  Further evaluation,  mri brain depending on initial workup.  Care turned over to Dr Rogene Houston  Final Clinical Impressions(s) / ED Diagnoses  pending   Dorie Rank, MD 06/21/19 1524

## 2019-06-21 NOTE — ED Triage Notes (Signed)
Patient reports that she has been having frequent falls. Patient's son sent a notebook in with the patient stating the same. Note also reports that the patient only drinks Mt. Dew and no water, AMS, and using cocaine. Patient states daily use.  Patient reports that she as not had a normal BM in 8 weeks,but has been taking Miralax.

## 2019-06-21 NOTE — ED Notes (Addendum)
An After Visit Summary was printed and given to the patient. Discharge instructions given and no further questions at this time. Pt steady and ambulatory. Pt states son is driving her home. Pt states she could not find shoes and would leave without them. Light blue slip on shoes found underneath covers and given to patient.

## 2019-06-22 ENCOUNTER — Telehealth: Payer: Self-pay | Admitting: Family Medicine

## 2019-06-22 NOTE — Telephone Encounter (Signed)
Pt needs to schedule an ED Follow up with Dr Darnell Level Seen in ED on 06/21/2019 for constipation - advised to follow up with Dr Darnell Level in a week.   ATC, voicemail full.  Will back.

## 2019-06-23 DIAGNOSIS — Z5189 Encounter for other specified aftercare: Secondary | ICD-10-CM | POA: Diagnosis not present

## 2019-06-23 NOTE — Telephone Encounter (Signed)
Noted  

## 2019-06-23 NOTE — Telephone Encounter (Signed)
ER appointment made for 06/28/2019

## 2019-06-24 NOTE — Telephone Encounter (Signed)
Seen at ER, f/u appt scheduled.

## 2019-06-26 DIAGNOSIS — F1120 Opioid dependence, uncomplicated: Secondary | ICD-10-CM | POA: Diagnosis not present

## 2019-06-26 DIAGNOSIS — F1420 Cocaine dependence, uncomplicated: Secondary | ICD-10-CM | POA: Diagnosis not present

## 2019-06-28 ENCOUNTER — Ambulatory Visit: Payer: Medicare HMO | Admitting: Family Medicine

## 2019-06-28 ENCOUNTER — Other Ambulatory Visit: Payer: Self-pay | Admitting: Orthopaedic Surgery

## 2019-06-28 DIAGNOSIS — M79641 Pain in right hand: Secondary | ICD-10-CM

## 2019-06-28 DIAGNOSIS — M25531 Pain in right wrist: Secondary | ICD-10-CM

## 2019-06-30 ENCOUNTER — Other Ambulatory Visit: Payer: Self-pay | Admitting: Psychiatry

## 2019-06-30 DIAGNOSIS — F411 Generalized anxiety disorder: Secondary | ICD-10-CM

## 2019-06-30 NOTE — Telephone Encounter (Signed)
Last refill 05/06/2019, looks like it's been discontinued by another provider

## 2019-07-04 ENCOUNTER — Ambulatory Visit
Admission: RE | Admit: 2019-07-04 | Discharge: 2019-07-04 | Disposition: A | Discharge status: Home / Self Care | Payer: Medicare HMO | Source: Ambulatory Visit | Attending: Orthopaedic Surgery | Admitting: Orthopaedic Surgery

## 2019-07-04 ENCOUNTER — Other Ambulatory Visit: Payer: Medicare HMO

## 2019-07-04 DIAGNOSIS — M25531 Pain in right wrist: Secondary | ICD-10-CM

## 2019-07-04 DIAGNOSIS — S52601D Unspecified fracture of lower end of right ulna, subsequent encounter for closed fracture with routine healing: Secondary | ICD-10-CM | POA: Diagnosis not present

## 2019-07-04 DIAGNOSIS — M79641 Pain in right hand: Secondary | ICD-10-CM

## 2019-07-04 DIAGNOSIS — S52501D Unspecified fracture of the lower end of right radius, subsequent encounter for closed fracture with routine healing: Secondary | ICD-10-CM | POA: Diagnosis not present

## 2019-07-06 ENCOUNTER — Encounter: Payer: Self-pay | Admitting: Family Medicine

## 2019-07-06 ENCOUNTER — Other Ambulatory Visit: Payer: Self-pay

## 2019-07-06 ENCOUNTER — Ambulatory Visit (INDEPENDENT_AMBULATORY_CARE_PROVIDER_SITE_OTHER): Payer: Medicare HMO | Admitting: Family Medicine

## 2019-07-06 DIAGNOSIS — Z720 Tobacco use: Secondary | ICD-10-CM

## 2019-07-06 DIAGNOSIS — Z1231 Encounter for screening mammogram for malignant neoplasm of breast: Secondary | ICD-10-CM | POA: Diagnosis not present

## 2019-07-06 DIAGNOSIS — E43 Unspecified severe protein-calorie malnutrition: Secondary | ICD-10-CM | POA: Diagnosis not present

## 2019-07-06 DIAGNOSIS — E871 Hypo-osmolality and hyponatremia: Secondary | ICD-10-CM

## 2019-07-06 DIAGNOSIS — Z1159 Encounter for screening for other viral diseases: Secondary | ICD-10-CM

## 2019-07-06 DIAGNOSIS — R413 Other amnesia: Secondary | ICD-10-CM | POA: Diagnosis not present

## 2019-07-06 DIAGNOSIS — S52601A Unspecified fracture of lower end of right ulna, initial encounter for closed fracture: Secondary | ICD-10-CM

## 2019-07-06 DIAGNOSIS — J449 Chronic obstructive pulmonary disease, unspecified: Secondary | ICD-10-CM | POA: Diagnosis not present

## 2019-07-06 DIAGNOSIS — S52501A Unspecified fracture of the lower end of right radius, initial encounter for closed fracture: Secondary | ICD-10-CM

## 2019-07-06 DIAGNOSIS — K2101 Gastro-esophageal reflux disease with esophagitis, with bleeding: Secondary | ICD-10-CM | POA: Diagnosis not present

## 2019-07-06 DIAGNOSIS — R634 Abnormal weight loss: Secondary | ICD-10-CM

## 2019-07-06 DIAGNOSIS — R270 Ataxia, unspecified: Secondary | ICD-10-CM

## 2019-07-06 DIAGNOSIS — M810 Age-related osteoporosis without current pathological fracture: Secondary | ICD-10-CM

## 2019-07-06 DIAGNOSIS — J4489 Other specified chronic obstructive pulmonary disease: Secondary | ICD-10-CM

## 2019-07-06 DIAGNOSIS — F1421 Cocaine dependence, in remission: Secondary | ICD-10-CM | POA: Diagnosis not present

## 2019-07-06 DIAGNOSIS — F319 Bipolar disorder, unspecified: Secondary | ICD-10-CM

## 2019-07-06 LAB — COMPREHENSIVE METABOLIC PANEL
ALT: 12 U/L (ref 0–35)
AST: 9 U/L (ref 0–37)
Albumin: 2.3 g/dL — ABNORMAL LOW (ref 3.5–5.2)
Alkaline Phosphatase: 125 U/L — ABNORMAL HIGH (ref 39–117)
BUN: 6 mg/dL (ref 6–23)
CO2: 25 mEq/L (ref 19–32)
Calcium: 7.8 mg/dL — ABNORMAL LOW (ref 8.4–10.5)
Chloride: 100 mEq/L (ref 96–112)
Creatinine, Ser: 0.4 mg/dL (ref 0.40–1.20)
GFR: 165.21 mL/min (ref >60.00)
Glucose, Bld: 93 mg/dL (ref 70–99)
Potassium: 3.4 mEq/L — ABNORMAL LOW (ref 3.5–5.1)
Sodium: 132 mEq/L — ABNORMAL LOW (ref 135–145)
Total Bilirubin: 0.3 mg/dL (ref 0.2–1.2)
Total Protein: 4.6 g/dL — ABNORMAL LOW (ref 6.0–8.3)

## 2019-07-06 LAB — POC URINALSYSI DIPSTICK (AUTOMATED)
Bilirubin, UA: NEGATIVE
Blood, UA: NEGATIVE
Glucose, UA: NEGATIVE
Ketones, UA: NEGATIVE
Leukocytes, UA: NEGATIVE
Nitrite, UA: NEGATIVE
Protein, UA: NEGATIVE
Spec Grav, UA: 1.01 (ref 1.010–1.025)
Urobilinogen, UA: 0.2 E.U./dL
pH, UA: 7 (ref 5.0–8.0)

## 2019-07-06 LAB — TSH: TSH: 1.37 u[IU]/mL (ref 0.35–4.50)

## 2019-07-06 LAB — VITAMIN D 25 HYDROXY (VIT D DEFICIENCY, FRACTURES): VITD: 48.39 ng/mL (ref 30.00–100.00)

## 2019-07-06 LAB — MAGNESIUM: Magnesium: 1.8 mg/dL (ref 1.5–2.5)

## 2019-07-06 LAB — PHOSPHORUS: Phosphorus: 4.1 mg/dL (ref 2.3–4.6)

## 2019-07-06 MED ORDER — SPIRIVA RESPIMAT 2.5 MCG/ACT IN AERS: 1.0000 | INHALATION_SPRAY | Freq: Every day | RESPIRATORY_TRACT | 6 refills | Status: DC

## 2019-07-06 MED ORDER — VITAMIN D 50 MCG (2000 UT) PO CAPS: 1.0000 | ORAL_CAPSULE | Freq: Every day | ORAL | Status: DC

## 2019-07-06 MED ORDER — CALCIUM CARBONATE 600 MG PO TABS: 600.0000 mg | ORAL_TABLET | Freq: Two times a day (BID) | ORAL | Status: DC
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
## 2019-07-06 NOTE — Patient Instructions (Addendum)
I think a lot of your symptoms may be coming from low protein and low calcium. Continue ensure daily. Try to get meats or plant based protein daily to help raise your protein levels.  Fill dexilant at pharmacy for heart burn - continue carafate Labs today to further evaluate low calcium Urinalysis today.  Start calcium 600mg  twice daily, and vitamin D 2000 units daily. We will order mammogram.  We will order lung cancer screening CT.  Return for well woman exam in 1 month  Hypocalcemia, Adult Hypocalcemia is when the level of calcium in a person's blood is below normal. Calcium is a mineral that is used by the body in many ways. Not having enough blood calcium can affect the nervous system. This can lead to problems with muscles, the heart, and the brain. What are the causes? This condition may be caused by:  A deficiency of vitamin D or magnesium or both.  Decreased levels of parathyroid hormone (hypoparathyroidism).  Kidney function problems.  Low levels of a body protein called albumin.  Inflammation of the pancreas (pancreatitis).  Not taking in enough vitamins and minerals in the diet or having intestinal problems that interfere with nutrient absorption.  Certain medicines. What are the signs or symptoms? Some people may not have any symptoms, especially if they have long-term (chronic) hypocalcemia. Symptoms of this condition may include:  Numbness and tingling in the fingers, toes, or around the mouth.  Muscle twitching, aches, or cramps, especially in the legs, feet, and back.  Spasm of the voice box (laryngospasm). This may make it difficult to breath or speak.  Fast heartbeats (palpitations) and abnormal heart rhythms (arrhythmias).  Shaking uncontrollably (seizures).  Memory problems, confusion, or difficulty thinking.  Depression, anxiety, irritability, or changes in personality. Long-term symptoms of this condition may include:  Coarse, brittle hair and  nails.  Dry skin or lasting skin diseases (psoriasis, eczema, or dermatitis).  Dental cavities.  Clouding of the eye lens (cataracts). How is this diagnosed?  This condition is usually diagnosed with a blood test. You may also have other tests to help determine the underlying cause of the condition. This may include more blood tests and imaging tests. How is this treated? This condition may be treated with:  Calcium given by mouth (orally) or given through an IV. The method used for giving calcium will depend on the severity of the condition. If your condition is severe, you may need to be closely monitored in the hospital.  Giving other minerals (electrolytes), such as magnesium. Other treatment will depend on the cause of the condition. Follow these instructions at home:  Follow diet instructions from your health care provider or dietitian.  Take supplements only as told by your health care provider.  Keep all follow-up visits as told by your health care provider. This is important. Contact a health care provider if you:  Have increased muscle twitching or cramps.  Have new swelling in the feet, ankles, or legs.  Develop changes in mood, memory, or personality. Get help right away if you:  Have chest pain.  Have persistent rapid or irregular heartbeats.  Have difficulty breathing.  Faint.  Start to have seizures.  Have confusion. Summary  Hypocalcemia is when the level of calcium in a person's blood is below normal. Not having enough blood calcium can affect the nervous system. This can lead to problems with muscles, the heart, and the brain.  This condition may be treated with calcium given by mouth or through  an IV, taking other minerals, and treating the underlying cause of hypocalcemia.  Take supplements only as told by your health care provider.  Contact a health care provider if you have new or worsening symptoms.  Keep all follow-up visits as told by  your health care provider. This is important. This information is not intended to replace advice given to you by your health care provider. Make sure you discuss any questions you have with your health care provider. Document Released: 02/04/2010 Document Revised: 08/26/2018 Document Reviewed: 08/26/2018 Elsevier Patient Education  2020 [employer].

## 2019-07-07 LAB — PARATHYROID HORMONE, INTACT (NO CA): PTH: 6 pg/mL — ABNORMAL LOW (ref 14–64)

## 2019-07-08 ENCOUNTER — Inpatient Hospital Stay (HOSPITAL_COMMUNITY): Payer: Medicare HMO

## 2019-07-08 ENCOUNTER — Other Ambulatory Visit: Payer: Self-pay

## 2019-07-08 ENCOUNTER — Emergency Department (HOSPITAL_COMMUNITY): Payer: Medicare HMO

## 2019-07-08 ENCOUNTER — Observation Stay (HOSPITAL_COMMUNITY)
Admission: EM | Admit: 2019-07-08 | Discharge: 2019-07-09 | Disposition: A | Discharge status: Home / Self Care | Payer: Medicare HMO | Attending: Orthopedic Surgery | Admitting: Orthopedic Surgery

## 2019-07-08 ENCOUNTER — Encounter: Payer: Self-pay | Admitting: Family Medicine

## 2019-07-08 ENCOUNTER — Encounter (HOSPITAL_COMMUNITY): Payer: Self-pay | Admitting: Emergency Medicine

## 2019-07-08 DIAGNOSIS — M810 Age-related osteoporosis without current pathological fracture: Secondary | ICD-10-CM | POA: Insufficient documentation

## 2019-07-08 DIAGNOSIS — M25511 Pain in right shoulder: Secondary | ICD-10-CM | POA: Diagnosis not present

## 2019-07-08 DIAGNOSIS — K219 Gastro-esophageal reflux disease without esophagitis: Secondary | ICD-10-CM | POA: Insufficient documentation

## 2019-07-08 DIAGNOSIS — E46 Unspecified protein-calorie malnutrition: Secondary | ICD-10-CM | POA: Insufficient documentation

## 2019-07-08 DIAGNOSIS — Z79899 Other long term (current) drug therapy: Secondary | ICD-10-CM | POA: Insufficient documentation

## 2019-07-08 DIAGNOSIS — W2203XA Walked into furniture, initial encounter: Secondary | ICD-10-CM | POA: Insufficient documentation

## 2019-07-08 DIAGNOSIS — Z03818 Encounter for observation for suspected exposure to other biological agents ruled out: Secondary | ICD-10-CM | POA: Diagnosis not present

## 2019-07-08 DIAGNOSIS — F411 Generalized anxiety disorder: Secondary | ICD-10-CM | POA: Insufficient documentation

## 2019-07-08 DIAGNOSIS — R270 Ataxia, unspecified: Secondary | ICD-10-CM | POA: Insufficient documentation

## 2019-07-08 DIAGNOSIS — W19XXXA Unspecified fall, initial encounter: Secondary | ICD-10-CM | POA: Diagnosis not present

## 2019-07-08 DIAGNOSIS — S42201A Unspecified fracture of upper end of right humerus, initial encounter for closed fracture: Secondary | ICD-10-CM | POA: Diagnosis not present

## 2019-07-08 DIAGNOSIS — F17210 Nicotine dependence, cigarettes, uncomplicated: Secondary | ICD-10-CM | POA: Insufficient documentation

## 2019-07-08 DIAGNOSIS — J449 Chronic obstructive pulmonary disease, unspecified: Secondary | ICD-10-CM | POA: Diagnosis not present

## 2019-07-08 DIAGNOSIS — S42291A Other displaced fracture of upper end of right humerus, initial encounter for closed fracture: Secondary | ICD-10-CM | POA: Diagnosis not present

## 2019-07-08 DIAGNOSIS — F319 Bipolar disorder, unspecified: Secondary | ICD-10-CM | POA: Insufficient documentation

## 2019-07-08 DIAGNOSIS — Z20828 Contact with and (suspected) exposure to other viral communicable diseases: Secondary | ICD-10-CM | POA: Diagnosis not present

## 2019-07-08 DIAGNOSIS — G8911 Acute pain due to trauma: Secondary | ICD-10-CM | POA: Diagnosis not present

## 2019-07-08 LAB — BASIC METABOLIC PANEL
Anion gap: 10 (ref 5–15)
BUN: 8 mg/dL (ref 6–20)
CO2: 19 mmol/L — ABNORMAL LOW (ref 22–32)
Calcium: 7.5 mg/dL — ABNORMAL LOW (ref 8.9–10.3)
Chloride: 105 mmol/L (ref 98–111)
Creatinine, Ser: 0.46 mg/dL (ref 0.44–1.00)
GFR calc Af Amer: >60 mL/min (ref >60)
GFR calc non Af Amer: >60 mL/min (ref >60)
Glucose, Bld: 105 mg/dL — ABNORMAL HIGH (ref 70–99)
Potassium: 3.7 mmol/L (ref 3.5–5.1)
Sodium: 134 mmol/L — ABNORMAL LOW (ref 135–145)

## 2019-07-08 LAB — CBC WITH DIFFERENTIAL/PLATELET
Abs Immature Granulocytes: 0.07 10*3/uL (ref 0.00–0.07)
Basophils Absolute: 0 10*3/uL (ref 0.0–0.1)
Basophils Relative: 0 %
Eosinophils Absolute: 1.2 10*3/uL — ABNORMAL HIGH (ref 0.0–0.5)
Eosinophils Relative: 7 %
HCT: 27.5 % — ABNORMAL LOW (ref 36.0–46.0)
Hemoglobin: 8.8 g/dL — ABNORMAL LOW (ref 12.0–15.0)
Immature Granulocytes: 0 %
Lymphocytes Relative: 7 %
Lymphs Abs: 1.3 10*3/uL (ref 0.7–4.0)
MCH: 29.7 pg (ref 26.0–34.0)
MCHC: 32 g/dL (ref 30.0–36.0)
MCV: 92.9 fL (ref 80.0–100.0)
Monocytes Absolute: 1.4 10*3/uL — ABNORMAL HIGH (ref 0.1–1.0)
Monocytes Relative: 8 %
Neutro Abs: 13.8 10*3/uL — ABNORMAL HIGH (ref 1.7–7.7)
Neutrophils Relative %: 78 %
Platelets: 512 10*3/uL — ABNORMAL HIGH (ref 150–400)
RBC: 2.96 MIL/uL — ABNORMAL LOW (ref 3.87–5.11)
RDW: 16.4 % — ABNORMAL HIGH (ref 11.5–15.5)
WBC: 17.7 10*3/uL — ABNORMAL HIGH (ref 4.0–10.5)
nRBC: 0 % (ref 0.0–0.2)

## 2019-07-08 MED ORDER — OXYCODONE HCL 5 MG PO TABS
5.0000 mg | ORAL_TABLET | ORAL | Status: DC | PRN
  Administered 2019-07-08 – 2019-07-09 (×3): 10 mg via ORAL
  Filled 2019-07-08 (×3): qty 2

## 2019-07-08 MED ORDER — CHLORHEXIDINE GLUCONATE 4 % EX LIQD
60.0000 mL | Freq: Once | CUTANEOUS | Status: AC
  Administered 2019-07-09: 4 via TOPICAL
  Filled 2019-07-08: qty 60

## 2019-07-08 MED ORDER — METHOCARBAMOL 1000 MG/10ML IJ SOLN
500.0000 mg | Freq: Four times a day (QID) | INTRAVENOUS | Status: DC | PRN
  Filled 2019-07-08: qty 5

## 2019-07-08 MED ORDER — HYDROMORPHONE HCL 1 MG/ML IJ SOLN
0.5000 mg | INTRAMUSCULAR | Status: DC | PRN
  Administered 2019-07-09: 0.5 mg via INTRAVENOUS
  Filled 2019-07-08: qty 1

## 2019-07-08 MED ORDER — SODIUM CHLORIDE 0.9 % IV SOLN
Freq: Once | INTRAVENOUS | Status: AC
  Administered 2019-07-08: 19:00:00 via INTRAVENOUS

## 2019-07-08 MED ORDER — TRAMADOL HCL 50 MG PO TABS
50.0000 mg | ORAL_TABLET | Freq: Four times a day (QID) | ORAL | Status: DC
  Administered 2019-07-08 – 2019-07-09 (×3): 50 mg via ORAL
  Filled 2019-07-08 (×3): qty 1

## 2019-07-08 MED ORDER — METHOCARBAMOL 500 MG PO TABS
500.0000 mg | ORAL_TABLET | Freq: Four times a day (QID) | ORAL | Status: DC | PRN
  Administered 2019-07-08 – 2019-07-09 (×2): 500 mg via ORAL
  Filled 2019-07-08 (×2): qty 1

## 2019-07-08 MED ORDER — QUETIAPINE FUMARATE 400 MG PO TABS
800.0000 mg | ORAL_TABLET | Freq: Every day | ORAL | Status: DC
  Administered 2019-07-08: 800 mg via ORAL
  Filled 2019-07-08 (×2): qty 2
  Filled 2019-07-08: qty 8
  Filled 2019-07-08: qty 2

## 2019-07-08 MED ORDER — ACETAMINOPHEN 325 MG PO TABS
325.0000 mg | ORAL_TABLET | Freq: Four times a day (QID) | ORAL | Status: DC | PRN
  Administered 2019-07-08: 650 mg via ORAL
  Filled 2019-07-08: qty 2

## 2019-07-08 MED ORDER — ASPIRIN 81 MG PO CHEW
81.0000 mg | CHEWABLE_TABLET | Freq: Every day | ORAL | Status: DC
  Administered 2019-07-08: 81 mg via ORAL
  Filled 2019-07-08: qty 1

## 2019-07-08 MED ORDER — CEFAZOLIN SODIUM-DEXTROSE 2-4 GM/100ML-% IV SOLN: 2.0000 g | INTRAVENOUS | Status: DC

## 2019-07-08 MED ORDER — ONDANSETRON HCL 4 MG/2ML IJ SOLN
4.0000 mg | Freq: Once | INTRAMUSCULAR | Status: DC
  Filled 2019-07-08 (×2): qty 2

## 2019-07-08 MED ORDER — POVIDONE-IODINE 10 % EX SWAB: 2.0000 "application " | Freq: Once | CUTANEOUS | Status: DC

## 2019-07-08 MED ORDER — CALCIUM CARBONATE 600 MG PO TABS: 600.0000 mg | ORAL_TABLET | Freq: Every day | ORAL | Status: DC

## 2019-07-08 MED ORDER — ONDANSETRON 4 MG PO TBDP
4.0000 mg | ORAL_TABLET | Freq: Once | ORAL | Status: AC
  Administered 2019-07-08: 4 mg via ORAL
  Filled 2019-07-08: qty 1

## 2019-07-08 MED ORDER — HYDROCODONE-ACETAMINOPHEN 5-325 MG PO TABS
1.0000 | ORAL_TABLET | Freq: Once | ORAL | Status: AC
  Administered 2019-07-08: 1 via ORAL
  Filled 2019-07-08: qty 1

## 2019-07-08 MED ORDER — FENTANYL CITRATE (PF) 100 MCG/2ML IJ SOLN
50.0000 ug | Freq: Once | INTRAMUSCULAR | Status: AC
  Administered 2019-07-08: 50 ug via INTRAVENOUS
  Filled 2019-07-08: qty 2

## 2019-07-08 NOTE — Progress Notes (Addendum)
ORTHOPAEDIC CONSULTATION  REQUESTING PHYSICIAN: Lennice Sites, DO  Chief Complaint: "My right shoulder hurts"  HPI: Robin Welch is a 53 y.o. female who presents with right shoulder pain following mechanical fall at around 1 AM this morning.  She has been having trouble with insomnia and got up from bed, hitting her knee on a coffee table resulting in a mechanical fall and injury to her right shoulder.  She denies any previous injuries with the right shoulder.  She has never had surgery on the right shoulder.  She denies any numbness or tingling or weakness.  Patient notes a history of bipolar disorder, COPD, osteoporosis.  She is not undergoing any treatment for osteoporosis.  She notes that she has had multiple fractures recently, with the most recent fracture back in October.  She had a right wrist fracture in October that is being treated by Dr. Jeannie Fend.  She denies any pain throughout the rest of her extremities.  Denies any history of diabetes or any personal/family history of DVT.  She is not taking any blood thinning medication or chronic narcotic pain medication.  Past Medical History:  Diagnosis Date  . Anxiety   . Barrett esophagus   . Bipolar 1 disorder [facility])    sees Dr. Candis Schatz psych [phone])  . Closed fracture distal radius and ulna, left, initial encounter 04/20/2019   S/p ORIF 04/2019 Jeannie Fend)   . COPD (chronic obstructive pulmonary disease) ([place]) 03/2013   hyperinflation by CXR  . History of cocaine dependence ([place]) latest 2020   undergoing detox  . History of stomach ulcers   . Hx: UTI (urinary tract infection)   . Migraine    "stopped in ~ 2008" (03/11/2017)  . Muscle strain of gluteal region, right, subsequent encounter 10/05/2018   By MRI 2020  . Osteoporosis 06/09/2018   DEXA 05/2017 - T score -2.5 L hip, -1.3 spine  . Osteoporosis   . Positive PPD 1989   s/p CXR and INH 6 mo  . Seizure ([place]) X 1   "from takiing too many headache medicine?" (03/11/2017)  .  Smoker    Past Surgical History:  Procedure Laterality Date  . COLONOSCOPY  07/2018   poor colon prep - rec repeat (Nandigam)  . DILATION AND CURETTAGE OF UTERUS  X 1   S/P miscarriage  . ESOPHAGOGASTRODUODENOSCOPY  03/2017   candidal esophagitis, treated, LA grade D esophagitis, 4cm HH (Nandigam)  . ESOPHAGOGASTRODUODENOSCOPY  07/2018   severe reflux esophagitis, neg candida (Nandigam)  . LAPAROSCOPIC CHOLECYSTECTOMY  2006  . ORIF WRIST FRACTURE Right 04/08/2019   Procedure: OPEN REDUCTION INTERNAL FIXATION (ORIF) DISTAL RADIUS/ULNA FRACTURE;  Surgeon: Verner Mould, MD;  Location: [facility];  Service: Orthopedics;  Laterality: Right;  . TUBAL LIGATION  2003   Social History   Socioeconomic History  . Marital status: Divorced    Spouse name: Not on file  . Number of children: 2  . Years of education: bachelors  . Highest education level: Not on file  Occupational History  . 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 Financial resource strain: Not on file  . Food insecurity    Worry: Not on file    Inability: Not on file  . Transportation needs    Medical: Not on file    Non-medical: Not on file  Tobacco Use  . Smoking status: Current Every Day Smoker  Packs/day: 1.00    Years: 36.00    Pack years: 36.00    Types: Cigarettes  . Smokeless tobacco: Never Used  Substance and Sexual Activity  . Alcohol use: Not Currently    Alcohol/week: 0.0 standard drinks    Comment: 03/11/2017 "stopped 09/18/2015; never an alcoholic"  . Drug use: Yes    Types: Cocaine    Comment: patient sttes daily use  . Sexual activity: Never    Birth control/protection: Surgical  Lifestyle  . Physical activity    Days per week: Not on file    Minutes per session: Not on file  . Stress: Not on file  Relationships  . Social Herbalist on phone: Not on file    Gets together: Not on file     Attends religious service: Not on file    Active member of club or organization: Not on file    Attends meetings of clubs or organizations: Not on file    Relationship status: Not on file  Other Topics Concern  . Not on file  Social History Narrative   Caffeine: 40 oz diet mountain dew/day   Lives with husband and 2 children, no pets      Family History  Problem Relation Age of Onset  . Diabetes Mother        T2DM  . Breast cancer Mother 24  . Dementia Mother   . Lymphoma Mother   . Coronary artery disease Father 5       MI  . Anxiety disorder Paternal Aunt   . Anxiety disorder Cousin   . Depression Cousin   . ADD / ADHD Child   . Anxiety disorder Child   . Depression Child   . Stroke Neg Hx   . Colon cancer Neg Hx   . Cancer - Colon Neg Hx    - negative except otherwise stated in the family history section Allergies  Allergen Reactions  . Prozac [Fluoxetine Hcl] Swelling and Rash  . Doxycycline Nausea And Vomiting  . Celexa [Citalopram Hydrobromide] Other (See Comments)    Possible allergic reaction  . Chantix [Varenicline Tartrate] Swelling    Mouth swelling  . Hydroxyzine Other (See Comments)    Not effective  . Klonopin [Clonazepam] Other (See Comments)    Caused instablility. Not able to drive due to over medicated  . Lunesta [Eszopiclone] Other (See Comments)    Not effective  . Prozac [Fluoxetine Hcl] Swelling  . Sonata [Zaleplon] Other (See Comments)    Not effective  . Trazodone And Nefazodone Other (See Comments)    ineffective  . Zolpimist [Zolpidem Tartrate] Other (See Comments)    Not effective  . Azithromycin Other (See Comments)    Reaction not recalled by the patient   Prior to Admission medications   Medication Sig Start Date End Date Taking? Authorizing Provider  albuterol (VENTOLIN HFA) 108 (90 Base) MCG/ACT inhaler Inhale 1-2 puffs into the lungs every 6 (six) hours as needed for wheezing or shortness of breath. 06/29/18  Yes Recardo Evangelist, PA-C  ALPRAZolam Duanne Moron) 1 MG tablet Take 1 tablet by mouth daily as needed for anxiety.  06/30/19  Yes [provider]  calcium carbonate (CALCIUM 600) 600 MG TABS tablet Take 1 tablet (600 mg total) by mouth daily with breakfast. 07/08/19  Yes Ria Bush, MD  Cholecalciferol (VITAMIN D) 50 MCG (2000 UT) CAPS Take 1 capsule (2,000 Units total) by mouth daily. 07/06/19  Yes Ria Bush, MD  ondansetron (ZOFRAN-ODT) 4 MG disintegrating tablet Take 1 tablet (4 mg total) by mouth every 8 (eight) hours as needed for nausea or vomiting. 04/12/19  Yes Ria Bush, MD  QUEtiapine (SEROQUEL) 400 MG tablet Take 800 mg by mouth at bedtime.   Yes [provider]  sucralfate (CARAFATE) 1 GM/10ML suspension Take 10 mLs (1 g total) by mouth 4 (four) times daily. 12/19/18  Yes Nandigam, Venia Minks, MD  Tiotropium Bromide Monohydrate (SPIRIVA RESPIMAT) 2.5 MCG/ACT AERS Inhale 1 puff into the lungs daily. 07/06/19  Yes Ria Bush, MD  dexlansoprazole (DEXILANT) 60 MG capsule Take 1 capsule (60 mg total) by mouth daily. Patient not taking: Reported on 07/08/2019 06/01/19   Ria Bush, MD  hydrOXYzine (ATARAX/VISTARIL) 25 MG tablet Take 2 tablets (50 mg total) by mouth 3 (three) times daily. Patient not taking: Reported on 07/08/2019 06/29/18   Recardo Evangelist, PA-C  polyethylene glycol (MIRALAX) 17 g packet Take 17 g by mouth daily. Patient not taking: Reported on 07/08/2019 06/21/19   Fredia Sorrow, MD  QUEtiapine (SEROQUEL) 400 MG tablet Take 2 tablets (800 mg total) by mouth at bedtime. Patient not taking: Reported on 07/08/2019 11/15/18 06/01/19  Thayer Headings, PMHNP   Dg Humerus Right  Result Date: 07/08/2019 CLINICAL DATA:  Pt tripped and fell over her coffee table today and landed on her right shoulder. No prior injuries or surgeries. EXAM: RIGHT HUMERUS - 2+ VIEW COMPARISON:  None. FINDINGS: Significantly displaced and impacted fracture of the right  humeral neck. There is apparent involvement of the lesser tuberosity. The remainder of the right humerus appears intact. There is a nondisplaced right rib fracture. No definite pneumothorax. IMPRESSION: 1. Displaced and impacted fracture of the right humeral neck with apparent involvement of the lesser tuberosity. 2. Nondisplaced right rib fracture.  No definite pneumothorax. Electronically Signed   By: Audie Pinto M.D.   On: 07/08/2019 14:42   - pertinent xrays, CT, MRI studies were reviewed and independently interpreted  Positive ROS: All other systems have been reviewed and were otherwise negative with the exception of those mentioned in the HPI and as above.  Physical Exam: General: Alert, no acute distress Psychiatric: Patient is competent for consent with normal mood and affect Lymphatic: No axillary or cervical lymphadenopathy Cardiovascular: No pedal edema Respiratory: No cyanosis, no use of accessory musculature GI: No organomegaly, abdomen is soft and non-tender    Images:  @ENCIMAGES @  Labs:  Lab Results  Component Value Date   HGBA1C 5.4 12/28/2012   REPTSTATUS 03/12/2017 FINAL 03/11/2017   CULT <10,000 COLONIES/mL INSIGNIFICANT GROWTH (A) 03/11/2017    Lab Results  Component Value Date   ALBUMIN 2.3 (L) 07/06/2019   ALBUMIN 1.6 (L) 06/21/2019   ALBUMIN 2.5 (L) 04/08/2019    Neurologic: Patient does not have protective sensation bilateral lower extremities.   MUSCULOSKELETAL:   Pain with very little passive range of motion of the right shoulder.  No pain with palpation through the right elbow.  Mild pain to palpation through the right wrist but patient states that this is no worse than prior to her fall.  Motor function of the right hand intact; IP flexion, EPL, wrist extension, finger abduction, finger adduction intact.  Sensation intact through the right hand.  2+ radial pulse.  No pain with passive range of motion of the left wrist, left elbow, left  shoulder.  No pain with logroll bilateral hips.  No pain with passive range of motion of bilateral knees.  No knee  effusion bilaterally.  No pain with passive range of motion of the bilateral ankle and subtalar joints.  Assessment: Closed fracture of right proximal humerus  Plan: Admit to inpatient.  Plan for surgery tomorrow morning by Dr. Alphonzo Severance.  Plan for ORIF of right humerus.  N.p.o. after midnight tonight.  Patient will follow up with Dr. Alphonzo Severance 1 to 2 weeks postoperatively.  Thank you for the consult and the opportunity to see Ms. [address] Gar[place], [place] [facility] 3:51 PM 07/08/2019

## 2019-07-08 NOTE — Assessment & Plan Note (Signed)
Poorly healing, likely due to poor nutritional status and low calcium levels. Reviewed importance of calcium replacement.

## 2019-07-08 NOTE — Assessment & Plan Note (Signed)
Persistent concern - discussed chronic hypocalcemia can cause dementia. Will work towards correcting electrolyte imbalance and reassess.

## 2019-07-08 NOTE — ED Notes (Signed)
Lunch Tray Ordered @ 1736. 

## 2019-07-08 NOTE — Addendum Note (Signed)
Addended by: Ria Bush on: 07/08/2019 10:11 AM   Modules accepted: Orders

## 2019-07-08 NOTE — ED Notes (Signed)
Pt states pain unchanged since tramadol administered, pt requesting additional Oxycodone, explained to patient she just had Oxy at 5pm and she is unable to receive anymore at this time. Pt states Fentanyl did not last long enough. Offered pt tylenol for pain.

## 2019-07-08 NOTE — Progress Notes (Signed)
Orthopedic Tech Progress Note Patient Details:  Robin Welch 12/03/1965 [id number]  Ortho Devices Type of Ortho Device: Sling immobilizer Ortho Device/Splint Location: RUE Ortho Device/Splint Interventions: Adjustment, Application, Ordered   Post Interventions Patient Tolerated: Well Instructions Provided: Care of device   [name] 07/08/2019, 2:46 PM

## 2019-07-08 NOTE — ED Provider Notes (Signed)
[facility] EMERGENCY DEPARTMENT Provider Note   CSN: [id number] Arrival date & time: 07/08/19  1253     History   Chief Complaint Chief Complaint  Patient presents with  . Arm Injury    HPI Robin Welch is a 53 y.o. female.  With a past medical history history of bipolar disorder, COPD, osteoporosis who presents to the emergency department from [facility] culture throat.  She presented there for evaluation of her right arm injury.  Patient states that last night she got up in the middle of the night, tripped over her coffee table and landed directly on her right shoulder.  She denies numbness or tingling.  She complained of severe pain immediately and states that she knew she broke her arm.  She states that when she tries to move it she can feel "bones grinding around."  She did not hit her head or lose consciousness.  She is not on any blood thinning medications.     HPI  Past Medical History:  Diagnosis Date  . Anxiety   . Barrett esophagus   . Bipolar 1 disorder [facility])    sees Dr. Candis Schatz psych [phone])  . Closed fracture distal radius and ulna, left, initial encounter 04/20/2019   S/p ORIF 04/2019 Jeannie Fend)   . COPD (chronic obstructive pulmonary disease) ([place]) 03/2013   hyperinflation by CXR  . History of cocaine dependence ([place]) latest 2020   undergoing detox  . History of stomach ulcers   . Hx: UTI (urinary tract infection)   . Migraine    "stopped in ~ 2008" (03/11/2017)  . Muscle strain of gluteal region, right, subsequent encounter 10/05/2018   By MRI 2020  . Osteoporosis 06/09/2018   DEXA 05/2017 - T score -2.5 L hip, -1.3 spine  . Osteoporosis   . Positive PPD 1989   s/p CXR and INH 6 mo  . Seizure ([place]) X 1   "from takiing too many headache medicine?" (03/11/2017)  . Smoker     Patient Active Problem List   Diagnosis Date Noted  . Protein-calorie malnutrition ([place]) 07/08/2019  . Ataxia 07/08/2019  . Hypocalcemia 07/06/2019   . Pulmonary nodule less than 1 cm in diameter with moderate to high risk for malignant neoplasm 06/01/2019  . Closed fracture distal radius and ulna, right, initial encounter 04/20/2019  . Diarrhea of presumed infectious origin 01/31/2019  . GAD (generalized anxiety disorder) 09/02/2018  . Insomnia 09/02/2018  . Low serum vitamin B12 07/19/2018  . Memory loss 06/20/2018  . Closed nondisplaced fracture of third metatarsal bone of left foot 06/13/2018  . Osteoporosis 06/09/2018  . Chronic constipation 04/19/2018  . Gastroesophageal reflux disease 04/19/2018  . Hematemesis 04/19/2018  . Thrush 06/21/2017  . Chronic hyponatremia 03/11/2017  . Iron deficiency anemia 03/11/2017  . Thrombocytosis ([place]) 03/02/2017  . Non-intractable vomiting with nausea 03/01/2017  . History of cocaine dependence ([place])   . Migraine 05/08/2015  . Hoarseness of voice 04/04/2015  . Weight loss, unintentional 03/08/2015  . COPD (chronic obstructive pulmonary disease) with chronic bronchitis ([place]) 09/22/2013  . Tobacco abuse 12/18/2010  . Bipolar 1 disorder ([place]) 12/18/2010    Past Surgical History:  Procedure Laterality Date  . COLONOSCOPY  07/2018   poor colon prep - rec repeat (Nandigam)  . DILATION AND CURETTAGE OF UTERUS  X 1   S/P miscarriage  . ESOPHAGOGASTRODUODENOSCOPY  03/2017   candidal esophagitis, treated, LA grade D esophagitis, 4cm HH (Nandigam)  . ESOPHAGOGASTRODUODENOSCOPY  07/2018  severe reflux esophagitis, neg candida (Nandigam)  . LAPAROSCOPIC CHOLECYSTECTOMY  2006  . ORIF WRIST FRACTURE Right 04/08/2019   Procedure: OPEN REDUCTION INTERNAL FIXATION (ORIF) DISTAL RADIUS/ULNA FRACTURE;  Surgeon: Verner Mould, MD;  Location: [facility];  Service: Orthopedics;  Laterality: Right;  . TUBAL LIGATION  2003     OB History   No obstetric history on file.      Home Medications    Prior to Admission medications   Medication Sig Start Date End Date Taking? Authorizing Provider   albuterol (VENTOLIN HFA) 108 (90 Base) MCG/ACT inhaler Inhale 1-2 puffs into the lungs every 6 (six) hours as needed for wheezing or shortness of breath. 06/29/18   Recardo Evangelist, PA-C  calcium carbonate (CALCIUM 600) 600 MG TABS tablet Take 1 tablet (600 mg total) by mouth daily with breakfast. 07/08/19   Ria Bush, MD  Cholecalciferol (VITAMIN D) 50 MCG (2000 UT) CAPS Take 1 capsule (2,000 Units total) by mouth daily. 07/06/19   Ria Bush, MD  dexlansoprazole (DEXILANT) 60 MG capsule Take 1 capsule (60 mg total) by mouth daily. 06/01/19   Ria Bush, MD  hydrOXYzine (ATARAX/VISTARIL) 25 MG tablet Take 2 tablets (50 mg total) by mouth 3 (three) times daily. Patient taking differently: Take 25 mg by mouth daily.  06/29/18   Recardo Evangelist, PA-C  ondansetron (ZOFRAN-ODT) 4 MG disintegrating tablet Take 1 tablet (4 mg total) by mouth every 8 (eight) hours as needed for nausea or vomiting. 04/12/19   Ria Bush, MD  polyethylene glycol (MIRALAX) 17 g packet Take 17 g by mouth daily. 06/21/19   Fredia Sorrow, MD  QUEtiapine (SEROQUEL) 400 MG tablet Take 2 tablets (800 mg total) by mouth at bedtime. 11/15/18 06/01/19  Thayer Headings, PMHNP  QUEtiapine (SEROQUEL) 400 MG tablet Take 800 mg by mouth at bedtime.    [provider]  sucralfate (CARAFATE) 1 GM/10ML suspension Take 10 mLs (1 g total) by mouth 4 (four) times daily. 12/19/18   Mauri Pole, MD  Tiotropium Bromide Monohydrate (SPIRIVA RESPIMAT) 2.5 MCG/ACT AERS Inhale 1 puff into the lungs daily. 07/06/19   Ria Bush, MD    Family History Family History  Problem Relation Age of Onset  . Diabetes Mother        T2DM  . Breast cancer Mother 68  . Dementia Mother   . Lymphoma Mother   . Coronary artery disease Father 79       MI  . Anxiety disorder Paternal Aunt   . Anxiety disorder Cousin   . Depression Cousin   . ADD / ADHD Child   . Anxiety disorder Child   . Depression Child    . Stroke Neg Hx   . Colon cancer Neg Hx   . Cancer - Colon Neg Hx     Social History Social History   Tobacco Use  . Smoking status: Current Every Day Smoker    Packs/day: 1.00    Years: 36.00    Pack years: 36.00    Types: Cigarettes  . Smokeless tobacco: Never Used  Substance Use Topics  . Alcohol use: Not Currently    Alcohol/week: 0.0 standard drinks    Comment: 03/11/2017 "stopped 09/18/2015; never an alcoholic"  . Drug use: Yes    Types: Cocaine    Comment: patient sttes daily use     Allergies   Prozac [fluoxetine hcl], Doxycycline, Celexa [citalopram hydrobromide], Chantix [varenicline tartrate], Hydroxyzine, Klonopin [clonazepam], Lunesta [eszopiclone], Prozac [fluoxetine hcl], Sonata [  zaleplon], Trazodone and nefazodone, Zolpimist [zolpidem tartrate], and Azithromycin   Review of Systems Review of Systems Ten systems reviewed and are negative for acute change, except as noted in the HPI.    Physical Exam Updated Vital Signs BP 90/65   Pulse (!) 115   Temp 98 F (36.7 C) (Oral)   Resp 20   LMP 04/23/2015 (Approximate)   SpO2 97%   Physical Exam Vitals signs and nursing note reviewed.  Constitutional:      General: She is not in acute distress.    Appearance: She is well-developed. She is not diaphoretic.  HENT:     Head: Normocephalic and atraumatic.  Eyes:     General: No scleral icterus.    Conjunctiva/sclera: Conjunctivae normal.  Neck:     Musculoskeletal: Normal range of motion.  Cardiovascular:     Rate and Rhythm: Normal rate and regular rhythm.     Heart sounds: Normal heart sounds. No murmur. No friction rub. No gallop.   Pulmonary:     Effort: Pulmonary effort is normal. No respiratory distress.     Breath sounds: Normal breath sounds.  Abdominal:     General: Bowel sounds are normal. There is no distension.     Palpations: Abdomen is soft. There is no mass.     Tenderness: There is no abdominal tenderness. There is no guarding.   Musculoskeletal:     Comments: Right arm examined in sling.   Inspection:No erythema, swelling, atrophy, hypertrophy, abrasions, or lacerations or obvious deformities noted. Palpation- Tender to palpation at the occipital humerus,  compartments soft ROM- Full ROM at the elbow, wrist. Strength- 5/5 AIN/PIN/U NV- SILT M/R/U, +2 Radial +2 ulnar pulse   Skin:    General: Skin is warm and dry.  Neurological:     Mental Status: She is alert and oriented to person, place, and time.  Psychiatric:        Behavior: Behavior normal.      ED Treatments / Results  Labs (all labs ordered are listed, but only abnormal results are displayed) Labs Reviewed - No data to display  EKG None  Radiology Dg Humerus Right  Result Date: 07/08/2019 CLINICAL DATA:  Pt tripped and fell over her coffee table today and landed on her right shoulder. No prior injuries or surgeries. EXAM: RIGHT HUMERUS - 2+ VIEW COMPARISON:  None. FINDINGS: Significantly displaced and impacted fracture of the right humeral neck. There is apparent involvement of the lesser tuberosity. The remainder of the right humerus appears intact. There is a nondisplaced right rib fracture. No definite pneumothorax. IMPRESSION: 1. Displaced and impacted fracture of the right humeral neck with apparent involvement of the lesser tuberosity. 2. Nondisplaced right rib fracture.  No definite pneumothorax. Electronically Signed   By: Audie Pinto M.D.   On: 07/08/2019 14:42    Procedures Procedures (including critical care time)  Medications Ordered in ED Medications - No data to display   Initial Impression / Assessment and Plan / ED Course  I have reviewed the triage vital signs and the nursing notes.  Pertinent labs & imaging results that were available during my care of the patient were reviewed by me and considered in my medical decision making (see chart for details).        53 year old female here with displaced and impacted  fracture of the right humeral neck along with one single rib fracture on the right side.  I discussed the case with Dr. Marlou Sa who is on-call for  orthopedic surgery.  He asked that she be admitted to the hospital will have his PA come back to evaluate her and admit her.  Plan on repair tomorrow.  Labs are pending.  I have ordered a Covid screening swab.  Final Clinical Impressions(s) / ED Diagnoses   Final diagnoses:  Closed fracture of proximal end of right humerus, unspecified fracture morphology, initial encounter    ED Discharge Orders    None       Margarita Mail, PA-C 07/08/19 [place], [place], DO 07/08/19 1847

## 2019-07-08 NOTE — Assessment & Plan Note (Signed)
Has not been taking calcium/vit D - advised restart this.

## 2019-07-08 NOTE — Assessment & Plan Note (Addendum)
H/o polysubstance use.  Endorses recent use, has gone to rehab recently, discharged on suboxone, last month we referred to local rehab.

## 2019-07-08 NOTE — Assessment & Plan Note (Addendum)
Severe. Has not been taking dexilant. Advised restart this (ready at her pharmacy) along with refill of carafate.

## 2019-07-08 NOTE — Assessment & Plan Note (Signed)
Requests spiriva refilled.

## 2019-07-08 NOTE — Assessment & Plan Note (Addendum)
Seems she's only on seroquel 800mg  nightly - which could contribute to chronic hyponatremia.  No upcoming f/u - encouraged schedule this.

## 2019-07-08 NOTE — ED Notes (Signed)
ED TO INPATIENT HANDOFF REPORT  ED Nurse Name and Phone #: Denaly Gatling [phone]  S Name/Age/Gender Robin Welch 53 y.o. female Room/Bed: H019C/H019C  Code Status   Code Status: Full Code  Home/SNF/Other Home Patient oriented to: self, place, time and situation Is this baseline? Yes   Triage Complete: Triage complete  Chief Complaint surgery  Triage Note Pt arrives to ED from Urgent Care with a diagnosed right humeral head fracture after tripping over a coffee table last night. Patient currently has a sling on the affected arm with +3 palpable radial pulse. Patients denies any injury to head or neck.    Allergies Allergies  Allergen Reactions  . Prozac [Fluoxetine Hcl] Swelling and Rash  . Doxycycline Nausea And Vomiting  . Celexa [Citalopram Hydrobromide] Other (See Comments)    Possible allergic reaction  . Chantix [Varenicline Tartrate] Swelling    Mouth swelling  . Hydroxyzine Other (See Comments)    Not effective  . Klonopin [Clonazepam] Other (See Comments)    Caused instablility. Not able to drive due to over medicated  . Lunesta [Eszopiclone] Other (See Comments)    Not effective  . Prozac [Fluoxetine Hcl] Swelling  . Sonata [Zaleplon] Other (See Comments)    Not effective  . Trazodone And Nefazodone Other (See Comments)    ineffective  . Zolpimist [Zolpidem Tartrate] Other (See Comments)    Not effective  . Azithromycin Other (See Comments)    Reaction not recalled by the patient    Level of Care/Admitting Diagnosis ED Disposition    ED Disposition Condition [place]: [place] [[id number]]  Level of Care: Med-Surg [16]  Covid Evaluation: Asymptomatic Screening Protocol (No Symptoms)  Diagnosis: Closed fracture of right proximal humerus [id number]  Admitting Physician: Meredith Pel [[id number]]  Attending Physician: Meredith Pel [[id number]]  Estimated length of stay: past midnight tomorrow  Certification:: I certify  this patient will need inpatient services for at least 2 midnights  Bed request comments: 5N  PT Class (Do Not Modify): Inpatient [101]  PT Acc Code (Do Not Modify): Private [1]       B Medical/Surgery History Past Medical History:  Diagnosis Date  . Anxiety   . Barrett esophagus   . Bipolar 1 disorder [facility])    sees Dr. Candis Schatz psych [phone])  . Closed fracture distal radius and ulna, left, initial encounter 04/20/2019   S/p ORIF 04/2019 [facility])   . COPD (chronic obstructive pulmonary disease) ([place]) 03/2013   hyperinflation by CXR  . History of cocaine dependence ([place]) latest 2020   undergoing detox  . History of stomach ulcers   . Hx: UTI (urinary tract infection)   . Migraine    "stopped in ~ 2008" (03/11/2017)  . Muscle strain of gluteal region, right, subsequent encounter 10/05/2018   By MRI 2020  . Osteoporosis 06/09/2018   DEXA 05/2017 - T score -2.5 L hip, -1.3 spine  . Osteoporosis   . Positive PPD 1989   s/p CXR and INH 6 mo  . Seizure ([place]) X 1   "from takiing too many headache medicine?" (03/11/2017)  . Smoker    Past Surgical History:  Procedure Laterality Date  . COLONOSCOPY  07/2018   poor colon prep - rec repeat ([place])  . DILATION AND CURETTAGE OF UTERUS  X 1   S/P miscarriage  . ESOPHAGOGASTRODUODENOSCOPY  03/2017   candidal esophagitis, treated, LA grade D esophagitis, 4cm HH ([place])  . ESOPHAGOGASTRODUODENOSCOPY  07/2018  severe reflux esophagitis, neg candida ([place])  . LAPAROSCOPIC CHOLECYSTECTOMY  2006  . ORIF WRIST FRACTURE Right 04/08/2019   Procedure: OPEN REDUCTION INTERNAL FIXATION (ORIF) DISTAL RADIUS/ULNA FRACTURE;  Surgeon: Verner Mould, MD;  Location: [place];  Service: Orthopedics;  Laterality: Right;  . TUBAL LIGATION  2003     A IV Location/Drains/Wounds Patient Lines/Drains/Airways Status   Active Line/Drains/Airways    Name:   Placement date:   Placement time:   Site:   Days:   Peripheral IV 07/08/19 Left  Wrist   07/08/19    1547    Wrist   less than 1   Incision (Closed) 04/08/19 Arm Right   04/08/19    1451     91          Intake/Output Last 24 hours No intake or output data in the 24 hours ending 07/08/19 2004  Labs/Imaging Results for orders placed or performed during the hospital encounter of 07/08/19 (from the past 48 hour(s))  Basic metabolic panel     Status: Abnormal   Collection Time: 07/08/19  1:19 PM  Result Value Ref Range   Sodium 134 (L) 135 - 145 mmol/L   Potassium 3.7 3.5 - 5.1 mmol/L   Chloride 105 98 - 111 mmol/L   CO2 19 (L) 22 - 32 mmol/L   Glucose, Bld 105 (H) 70 - 99 mg/dL   BUN 8 6 - 20 mg/dL   Creatinine, Ser 0.46 0.44 - 1.00 mg/dL   Calcium 7.5 (L) 8.9 - 10.3 mg/dL   GFR calc non Af Amer >60 >60 mL/min   GFR calc Af Amer >60 >60 mL/min   Anion gap 10 5 - 15    Comment: Performed at [facility], [address] [place], [place] [address]  CBC with Differential     Status: Abnormal   Collection Time: 07/08/19  1:19 PM  Result Value Ref Range   WBC 17.7 (H) 4.0 - 10.5 K/uL   RBC 2.96 (L) 3.87 - 5.11 MIL/uL   Hemoglobin 8.8 (L) 12.0 - 15.0 g/dL   HCT 27.5 (L) 36.0 - 46.0 %   MCV 92.9 80.0 - 100.0 fL   MCH 29.7 26.0 - 34.0 pg   MCHC 32.0 30.0 - 36.0 g/dL   RDW 16.4 (H) 11.5 - 15.5 %   Platelets 512 (H) 150 - 400 K/uL   nRBC 0.0 0.0 - 0.2 %   Neutrophils Relative % 78 %   Neutro Abs 13.8 (H) 1.7 - 7.7 K/uL   Lymphocytes Relative 7 %   Lymphs Abs 1.3 0.7 - 4.0 K/uL   Monocytes Relative 8 %   Monocytes Absolute 1.4 (H) 0.1 - 1.0 K/uL   Eosinophils Relative 7 %   Eosinophils Absolute 1.2 (H) 0.0 - 0.5 K/uL   Basophils Relative 0 %   Basophils Absolute 0.0 0.0 - 0.1 K/uL   Immature Granulocytes 0 %   Abs Immature Granulocytes 0.07 0.00 - 0.07 K/uL    Comment: Performed at [facility], [place] [address], [place], [place] [address]   Dg Humerus Right  Result Date: 07/08/2019 CLINICAL DATA:  Pt tripped and fell over her coffee table today  and landed on her right shoulder. No prior injuries or surgeries. EXAM: RIGHT HUMERUS - 2+ VIEW COMPARISON:  None. FINDINGS: Significantly displaced and impacted fracture of the right humeral neck. There is apparent involvement of the lesser tuberosity. The remainder of the right humerus appears intact. There  is a nondisplaced right rib fracture. No definite pneumothorax. IMPRESSION: 1. Displaced and impacted fracture of the right humeral neck with apparent involvement of the lesser tuberosity. 2. Nondisplaced right rib fracture.  No definite pneumothorax. Electronically Signed   By: Audie Pinto M.D.   On: 07/08/2019 14:42    Pending Labs Unresulted Labs (From admission, onward)    Start     Ordered   07/08/19 1625  HIV Antibody (routine testing w rflx)  (HIV Antibody (Routine testing w reflex) panel)  Once,   STAT     07/08/19 1624   07/08/19 1512  SARS CORONAVIRUS 2 (TAT 6-24 HRS) Nasopharyngeal Nasopharyngeal Swab  (Asymptomatic/Tier 2 Patients Labs)  Once,   STAT    Question Answer Comment  Is this test for diagnosis or screening Screening   Symptomatic for COVID-19 as defined by CDC No   Hospitalized for COVID-19 No   Admitted to ICU for COVID-19 No   Previously tested for COVID-19 Yes   Resident in a congregate (group) care setting No   Employed in healthcare setting No   Pregnant No      07/08/19 1511          Vitals/Pain Today's Vitals   07/08/19 1314 07/08/19 1925 07/08/19 1940 07/08/19 1945  BP:  (!) 89/60    Pulse:  (!) 103    Resp:  18    Temp:      TempSrc:      SpO2:  100%    PainSc: 10-Worst pain ever  8  8     Isolation Precautions No active isolations  Medications Medications  ondansetron (ZOFRAN) injection 4 mg (4 mg Intravenous Not Given 07/08/19 1819)  acetaminophen (TYLENOL) tablet 325-650 mg (650 mg Oral Given 07/08/19 1956)  oxyCODONE (Oxy IR/ROXICODONE) immediate release tablet 5-10 mg (10 mg Oral Given 07/08/19 1703)  HYDROmorphone (DILAUDID)  injection 0.5 mg (has no administration in time range)  traMADol (ULTRAM) tablet 50 mg (50 mg Oral Given 07/08/19 1849)  methocarbamol (ROBAXIN) tablet 500 mg (has no administration in time range)    Or  methocarbamol (ROBAXIN) 500 mg in dextrose 5 % 50 mL IVPB (has no administration in time range)  aspirin chewable tablet 81 mg (81 mg Oral Given 07/08/19 1956)  HYDROcodone-acetaminophen (NORCO/VICODIN) 5-325 MG per tablet 1 tablet (1 tablet Oral Given 07/08/19 1509)  ondansetron (ZOFRAN-ODT) disintegrating tablet 4 mg (4 mg Oral Given 07/08/19 1509)  0.9 %  sodium chloride infusion ( Intravenous New Bag/Given 07/08/19 1849)  fentaNYL (SUBLIMAZE) injection 50 mcg (50 mcg Intravenous Given 07/08/19 1548)    Mobility walks High fall risk   Focused Assessments MS   R Recommendations: See Admitting Provider Note  Report given to:   Additional Notes: MD messaged for night time med orders NPO after MNM

## 2019-07-08 NOTE — Assessment & Plan Note (Addendum)
Longstanding smoker, continue to encourage smoking cessation, agrees to referral for lung cancer screen.

## 2019-07-08 NOTE — ED Triage Notes (Signed)
Pt arrives to ED from Urgent Care with a diagnosed right humeral head fracture after tripping over a coffee table last night. Patient currently has a sling on the affected arm with +3 palpable radial pulse. Patients denies any injury to head or neck.

## 2019-07-08 NOTE — Assessment & Plan Note (Addendum)
As evidence by chronic hypoproteinemia and hypoalbuminemia - reviewed importance of regular meals with protein, consider protein supplement. Discussed how this contributes to hypocalcemia and poor wound/bone healing. Reassess at f/u visit.

## 2019-07-08 NOTE — Assessment & Plan Note (Signed)
Ongoing for months, anticipate related to malnutrition, could contribute to several of her current symptoms.  She will start calcium supplementation daily as well as work towards improving nutrition status with regular protein intake. Further evaluate etiology with labwork.

## 2019-07-09 ENCOUNTER — Encounter (HOSPITAL_COMMUNITY): Payer: Self-pay | Admitting: Certified Registered"

## 2019-07-09 ENCOUNTER — Encounter (HOSPITAL_COMMUNITY): Payer: Self-pay | Admitting: *Deleted

## 2019-07-09 ENCOUNTER — Encounter (HOSPITAL_COMMUNITY)
Admission: EM | Disposition: A | Discharge status: Home / Self Care | Payer: Self-pay | Source: Home / Self Care | Attending: Emergency Medicine

## 2019-07-09 ENCOUNTER — Inpatient Hospital Stay (HOSPITAL_COMMUNITY): Payer: Medicare HMO

## 2019-07-09 DIAGNOSIS — S42201A Unspecified fracture of upper end of right humerus, initial encounter for closed fracture: Secondary | ICD-10-CM | POA: Diagnosis present

## 2019-07-09 DIAGNOSIS — S42211A Unspecified displaced fracture of surgical neck of right humerus, initial encounter for closed fracture: Secondary | ICD-10-CM | POA: Diagnosis not present

## 2019-07-09 LAB — SURGICAL PCR SCREEN
MRSA, PCR: NEGATIVE
Staphylococcus aureus: NEGATIVE

## 2019-07-09 LAB — HIV ANTIBODY (ROUTINE TESTING W REFLEX): HIV Screen 4th Generation wRfx: NONREACTIVE

## 2019-07-09 LAB — SARS CORONAVIRUS 2 (TAT 6-24 HRS): SARS Coronavirus 2: NEGATIVE

## 2019-07-09 SURGERY — OPEN REDUCTION INTERNAL FIXATION (ORIF) PROXIMAL HUMERUS FRACTURE
Anesthesia: General | Laterality: Right

## 2019-07-09 MED ORDER — FENTANYL CITRATE (PF) 250 MCG/5ML IJ SOLN
INTRAMUSCULAR | Status: AC
  Filled 2019-07-09: qty 5

## 2019-07-09 MED ORDER — OXYCODONE HCL 5 MG PO TABS: 5.0000 mg | ORAL_TABLET | ORAL | 0 refills | Status: DC | PRN

## 2019-07-09 MED ORDER — BUPIVACAINE HCL (PF) 0.25 % IJ SOLN
INTRAMUSCULAR | Status: AC
  Filled 2019-07-09: qty 30

## 2019-07-09 MED ORDER — ASPIRIN 81 MG PO CHEW: 81.0000 mg | CHEWABLE_TABLET | Freq: Every day | ORAL | 0 refills | Status: DC

## 2019-07-09 MED ORDER — METHOCARBAMOL 500 MG PO TABS: 500.0000 mg | ORAL_TABLET | Freq: Three times a day (TID) | ORAL | 0 refills | Status: DC | PRN

## 2019-07-09 MED ORDER — ROCURONIUM BROMIDE 10 MG/ML (PF) SYRINGE
PREFILLED_SYRINGE | INTRAVENOUS | Status: AC
  Filled 2019-07-09: qty 10

## 2019-07-09 MED ORDER — PROPOFOL 10 MG/ML IV BOLUS
INTRAVENOUS | Status: AC
  Filled 2019-07-09: qty 20

## 2019-07-09 MED ORDER — LIDOCAINE 2% (20 MG/ML) 5 ML SYRINGE
INTRAMUSCULAR | Status: AC
  Filled 2019-07-09: qty 5

## 2019-07-09 MED ORDER — MIDAZOLAM HCL 2 MG/2ML IJ SOLN
INTRAMUSCULAR | Status: AC
  Filled 2019-07-09: qty 2

## 2019-07-09 NOTE — Progress Notes (Addendum)
CT scan from last night and repeat radiographs reviewed this morning of right proximal humerus fracture  Overall the shaft alignment in relation to the head is adequate.  Patient has a history of wrist fracture with delayed healing and hardware complications.  I think that if the fracture heals in this position she will have a functional shoulder.  The risk of surgery with this particular fracture include avascular necrosis nonunion malunion and potential need for more surgery.  In general if she does go on to nonunion or malunion the index procedure at that time would likely have a better outcome than doing the same procedure after placing hardware.  For these reasons and primarily because the fracture alignment looks reasonable at this time on plain radiographs this morning we will hold off on fixation for now.  If significant displacement occurs we will have to proceed with fixation but acutely I think this is worth a chance to heal on its own which would give her the optimal outcome in its current position.  Patient's poor nutritional status and smoking status also mitigate against operative intervention in this reasonably aligned proximal humerus fracture.

## 2019-07-09 NOTE — Plan of Care (Signed)

## 2019-07-09 NOTE — TOC Transition Note (Signed)
Transition of Care [facility]) - CM/SW Discharge Note   Patient Details  Name: Robin Welch MRN: [id number] Date of Birth: [birth year]  [facility] of [facility]) CM/SW Contact:  Claudie Leach, RN Phone Number: [phone] 07/09/2019, 10:55 AM   Clinical Narrative:    Pt to d/c home with precautions while her arm heals.  She was seen recently by PCP and has access to healthcare.  No HH needs.      Readmission Risk Interventions Readmission Risk Prevention Plan 07/09/2019  Transportation Screening Complete  PCP or Specialist Appt within 3-5 Days Complete  HRI or [place] Complete  Social Work Consult for [place] Planning/Counseling Not Complete  Palliative Care Screening Not Applicable  Medication Review [employer]) Referral to Pharmacy  Some recent data might be hidden

## 2019-07-09 NOTE — H&P (Signed)
Robin Welch is an 53 y.o. female.   Chief Complaint: Right arm pain HPI: Robin Welch is a patient who sustained a fall yesterday.  Describes having right shoulder pain.  Has long history of right wrist pain.  Presents now for operative versus nonoperative management of that problem.  Notably the patient was recently seen by her primary care provider who stated that she has significant malnutrition.  Patient also has osteoporosis and is a smoker.  All these are mitigating factors against operative treatment for this fracture  Past Medical History:  Diagnosis Date  . Anxiety   . Barrett esophagus   . Bipolar 1 disorder [facility])    sees Dr. Candis Schatz psych [phone])  . Closed fracture distal radius and ulna, left, initial encounter 04/20/2019   S/p ORIF 04/2019 [facility])   . COPD (chronic obstructive pulmonary disease) ([place]) 03/2013   hyperinflation by CXR  . History of cocaine dependence ([place]) latest 2020   undergoing detox  . History of stomach ulcers   . Hx: UTI (urinary tract infection)   . Migraine    "stopped in ~ 2008" (03/11/2017)  . Muscle strain of gluteal region, right, subsequent encounter 10/05/2018   By MRI 2020  . Osteoporosis 06/09/2018   DEXA 05/2017 - T score -2.5 L hip, -1.3 spine  . Osteoporosis   . Positive PPD 1989   s/p CXR and INH 6 mo  . Seizure ([place]) X 1   "from takiing too many headache medicine?" (03/11/2017)  . Smoker     Past Surgical History:  Procedure Laterality Date  . COLONOSCOPY  07/2018   poor colon prep - rec repeat ([place])  . DILATION AND CURETTAGE OF UTERUS  X 1   S/P miscarriage  . ESOPHAGOGASTRODUODENOSCOPY  03/2017   candidal esophagitis, treated, LA grade D esophagitis, 4cm HH ([place])  . ESOPHAGOGASTRODUODENOSCOPY  07/2018   severe reflux esophagitis, neg candida ([place])  . LAPAROSCOPIC CHOLECYSTECTOMY  2006  . ORIF WRIST FRACTURE Right 04/08/2019   Procedure: OPEN REDUCTION INTERNAL FIXATION (ORIF) DISTAL RADIUS/ULNA FRACTURE;  Surgeon:  Verner Mould, MD;  Location: [place];  Service: Orthopedics;  Laterality: Right;  . TUBAL LIGATION  2003    Family History  Problem Relation Age of Onset  . Diabetes Mother        T2DM  . Breast cancer Mother 39  . Dementia Mother   . Lymphoma Mother   . Coronary artery disease Father 90       MI  . Anxiety disorder Paternal Aunt   . Anxiety disorder Cousin   . Depression Cousin   . ADD / ADHD Child   . Anxiety disorder Child   . Depression Child   . Stroke Neg Hx   . Colon cancer Neg Hx   . Cancer - Colon Neg Hx    Social History:  reports that she has been smoking cigarettes. She has a 36.00 pack-year smoking history. She has never used smokeless tobacco. She reports previous alcohol use. She reports current drug use. Drug: Cocaine.  Allergies:  Allergies  Allergen Reactions  . Prozac [Fluoxetine Hcl] Swelling and Rash  . Doxycycline Nausea And Vomiting  . Celexa [Citalopram Hydrobromide] Other (See Comments)    Possible allergic reaction  . Chantix [Varenicline Tartrate] Swelling    Mouth swelling  . Hydroxyzine Other (See Comments)    Not effective  . Klonopin [Clonazepam] Other (See Comments)    Caused instablility. Not able to drive due to over medicated  .  Lunesta [Eszopiclone] Other (See Comments)    Not effective  . Prozac [Fluoxetine Hcl] Swelling  . Sonata [Zaleplon] Other (See Comments)    Not effective  . Trazodone And Nefazodone Other (See Comments)    ineffective  . Zolpimist [Zolpidem Tartrate] Other (See Comments)    Not effective  . Azithromycin Other (See Comments)    Reaction not recalled by the patient    Medications Prior to Admission  Medication Sig Dispense Refill  . albuterol (VENTOLIN HFA) 108 (90 Base) MCG/ACT inhaler Inhale 1-2 puffs into the lungs every 6 (six) hours as needed for wheezing or shortness of breath. 18 g 3  . ALPRAZolam (XANAX) 1 MG tablet Take 1 tablet by mouth daily as needed for anxiety.     . calcium  carbonate (CALCIUM 600) 600 MG TABS tablet Take 1 tablet (600 mg total) by mouth daily with breakfast. 3 tablet   . Cholecalciferol (VITAMIN D) 50 MCG (2000 UT) CAPS Take 1 capsule (2,000 Units total) by mouth daily.    . ondansetron (ZOFRAN-ODT) 4 MG disintegrating tablet Take 1 tablet (4 mg total) by mouth every 8 (eight) hours as needed for nausea or vomiting. 20 tablet 2  . QUEtiapine (SEROQUEL) 400 MG tablet Take 800 mg by mouth at bedtime.    . sucralfate (CARAFATE) 1 GM/10ML suspension Take 10 mLs (1 g total) by mouth 4 (four) times daily. 1260 mL 3  . Tiotropium Bromide Monohydrate (SPIRIVA RESPIMAT) 2.5 MCG/ACT AERS Inhale 1 puff into the lungs daily. 4 g 6  . dexlansoprazole (DEXILANT) 60 MG capsule Take 1 capsule (60 mg total) by mouth daily. (Patient not taking: Reported on 07/08/2019) 30 capsule 6  . hydrOXYzine (ATARAX/VISTARIL) 25 MG tablet Take 2 tablets (50 mg total) by mouth 3 (three) times daily. (Patient not taking: Reported on 07/08/2019) 120 tablet 5  . polyethylene glycol (MIRALAX) 17 g packet Take 17 g by mouth daily. (Patient not taking: Reported on 07/08/2019) 14 each 1  . QUEtiapine (SEROQUEL) 400 MG tablet Take 2 tablets (800 mg total) by mouth at bedtime. (Patient not taking: Reported on 07/08/2019) 180 tablet 1    Results for orders placed or performed during the hospital encounter of 07/08/19 (from the past 48 hour(s))  Basic metabolic panel     Status: Abnormal   Collection Time: 07/08/19  1:19 PM  Result Value Ref Range   Sodium 134 (L) 135 - 145 mmol/L   Potassium 3.7 3.5 - 5.1 mmol/L   Chloride 105 98 - 111 mmol/L   CO2 19 (L) 22 - 32 mmol/L   Glucose, Bld 105 (H) 70 - 99 mg/dL   BUN 8 6 - 20 mg/dL   Creatinine, Ser 0.46 0.44 - 1.00 mg/dL   Calcium 7.5 (L) 8.9 - 10.3 mg/dL   GFR calc non Af Amer >60 >60 mL/min   GFR calc Af Amer >60 >60 mL/min   Anion gap 10 5 - 15    Comment: Performed at [facility], [place] [address], [place],  [address]  CBC  with Differential     Status: Abnormal   Collection Time: 07/08/19  1:19 PM  Result Value Ref Range   WBC 17.7 (H) 4.0 - 10.5 K/uL   RBC 2.96 (L) 3.87 - 5.11 MIL/uL   Hemoglobin 8.8 (L) 12.0 - 15.0 g/dL   HCT 27.5 (L) 36.0 - 46.0 %   MCV 92.9 80.0 - 100.0 fL   MCH 29.7 26.0 - 34.0 pg  MCHC 32.0 30.0 - 36.0 g/dL   RDW 16.4 (H) 11.5 - 15.5 %   Platelets 512 (H) 150 - 400 K/uL   nRBC 0.0 0.0 - 0.2 %   Neutrophils Relative % 78 %   Neutro Abs 13.8 (H) 1.7 - 7.7 K/uL   Lymphocytes Relative 7 %   Lymphs Abs 1.3 0.7 - 4.0 K/uL   Monocytes Relative 8 %   Monocytes Absolute 1.4 (H) 0.1 - 1.0 K/uL   Eosinophils Relative 7 %   Eosinophils Absolute 1.2 (H) 0.0 - 0.5 K/uL   Basophils Relative 0 %   Basophils Absolute 0.0 0.0 - 0.1 K/uL   Immature Granulocytes 0 %   Abs Immature Granulocytes 0.07 0.00 - 0.07 K/uL    Comment: Performed at [facility] [address], [place], [place] [address]  SARS CORONAVIRUS 2 (TAT 6-24 HRS) Nasopharyngeal Nasopharyngeal Swab     Status: None   Collection Time: 07/08/19  5:02 PM   Specimen: Nasopharyngeal Swab  Result Value Ref Range   SARS Coronavirus 2 NEGATIVE NEGATIVE    Comment: (NOTE) SARS-CoV-2 target nucleic acids are NOT DETECTED. The SARS-CoV-2 RNA is generally detectable in upper and lower respiratory specimens during the acute phase of infection. Negative results do not preclude SARS-CoV-2 infection, do not rule out co-infections with other pathogens, and should not be used as the sole basis for treatment or other patient management decisions. Negative results must be combined with clinical observations, patient history, and epidemiological information. The expected result is Negative. Fact Sheet for Patients: SugarRoll.be Fact Sheet for Healthcare Providers: https://www.woods-mathews.com/ This test is not yet approved or cleared by the [place] FDA and  has been authorized for  detection and/or diagnosis of SARS-CoV-2 by FDA under an Emergency Use Authorization (EUA). This EUA will remain  in effect (meaning this test can be used) for the duration of the COVID-19 declaration under Section 56 4(b)(1) of the Act, 21 U.S.C. section 360bbb-3(b)(1), unless the authorization is terminated or revoked sooner. Performed at [facility], [place] [address], [place], [place] [address]   Surgical pcr screen     Status: None   Collection Time: 07/08/19 10:29 PM   Specimen: Nasal Mucosa; Nasal Swab  Result Value Ref Range   MRSA, PCR NEGATIVE NEGATIVE   Staphylococcus aureus NEGATIVE NEGATIVE    Comment: (NOTE) The Xpert SA Assay (FDA approved for NASAL specimens in patients 69 years of age and older), is one component of a comprehensive surveillance program. It is not intended to diagnose infection nor to guide or monitor treatment. Performed at [facility], [place] [address], [place], [place] [address]    Ct Shoulder Right Wo Contrast  Result Date: 07/08/2019 CLINICAL DATA:  Fracture of the humerus. EXAM: CT OF THE UPPER RIGHT EXTREMITY WITHOUT CONTRAST TECHNIQUE: Multidetector CT imaging of the upper right extremity was performed according to the standard protocol. COMPARISON:  July 08, 2019 FINDINGS: Bones/Joint/Cartilage Again noted is an acute impacted of the proximal right humerus. The fracture planes extend to the greater tubercle and into the lesser tubercle as well. There is no fracture of the glenoid. There is a possible partially visualized fracture involving the anterior fifth rib on the right. Ligaments Suboptimally assessed by CT. Muscles and Tendons No significant intramuscular hematoma. Soft tissues There is a moderate-sized joint effusion. There is a trace right-sided pleural effusion that is partially visualized. Emphysematous changes are noted involving the partially visualized right lung. There is  a small airspace opacity in the posterior right  upper lobe. IMPRESSION: 1. Acute fracture of the proximal right humerus as detailed above. 2. Possible fracture of the anterior fifth rib on the right. This is only partially visualized. 3. Emphysema. Airspace opacity in the right upper lobe may represent atelectasis or developing infiltrate. 4. Trace right-sided pleural effusion that is only partially visualized. Electronically Signed   By: Constance Holster M.D.   On: 07/08/2019 22:54   Dg Humerus Right  Result Date: 07/08/2019 CLINICAL DATA:  Pt tripped and fell over her coffee table today and landed on her right shoulder. No prior injuries or surgeries. EXAM: RIGHT HUMERUS - 2+ VIEW COMPARISON:  None. FINDINGS: Significantly displaced and impacted fracture of the right humeral neck. There is apparent involvement of the lesser tuberosity. The remainder of the right humerus appears intact. There is a nondisplaced right rib fracture. No definite pneumothorax. IMPRESSION: 1. Displaced and impacted fracture of the right humeral neck with apparent involvement of the lesser tuberosity. 2. Nondisplaced right rib fracture.  No definite pneumothorax. Electronically Signed   By: Audie Pinto M.D.   On: 07/08/2019 14:42    Review of Systems  Musculoskeletal: Positive for joint pain.  All other systems reviewed and are negative.   Blood pressure 95/66, pulse 82, temperature 98.1 F (36.7 C), temperature source Oral, resp. rate 17, last menstrual period 04/23/2015, SpO2 99 %. Physical Exam  Constitutional: She appears well-developed.  HENT:  Head: Normocephalic.  Eyes: Pupils are equal, round, and reactive to light.  Neck: Normal range of motion.  Cardiovascular: Normal rate.  Respiratory: Effort normal.  Neurological: She is alert.  Skin: Skin is warm.  Psychiatric: She has a normal mood and affect.  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  Elbow range of motion intact.  Patient has swelling about the right proximal humeral region but the deltoid does appear to fire.  Bilateral lower extremities have reasonable range of motion passively with no real coarse grinding or crepitus.  Assessment/Plan Impression is right proximal humerus fracture.  This was sustained yesterday.  Initial examination demonstrated significant displacement however subsequent CT scan done yesterday and plain radiographs done today show that the humeral shaft is relatively well aligned in relation to the humeral head.  Based on her history of osteoporosis malnutrition smoking as well as delayed healing from her distal radius fracture on the right I think the best course of action would be nonoperative treatment particularly based on the alignment that is present today.  If that changes we may need to alter course but for now the plan would be sling immobilization with repeat radiographs in 7 to 10 days and consideration of continuation of nonoperative treatment versus operative treatment based on how well the fracture remains aligned.  I do not think she is a very good operative candidate for any type of fixation.  If we do go the route of open reduction internal fixation then nonunion with subsequent reverse replacement would have a worse outcome than arthroplasty from the beginning.  That said complicated decision which we can make in the future if needed.  For now okay to discharge to home staying in the sling and following up in 7 to 10 days.  Anderson Malta, MD 07/09/2019, 9:52 AM

## 2019-07-09 NOTE — Anesthesia Preprocedure Evaluation (Deleted)
Anesthesia Evaluation    Reviewed: Allergy & Precautions, Patient's Chart, lab work & pertinent test results  History of Anesthesia Complications Negative for: history of anesthetic complications  Airway        Dental   Pulmonary COPD,  COPD inhaler, Current Smoker,           Cardiovascular negative cardio ROS       Neuro/Psych Anxiety Bipolar Disorder negative neurological ROS     GI/Hepatic GERD  ,(+)     substance abuse  ,   Endo/Other  negative endocrine ROS  Renal/GU negative Renal ROS     Musculoskeletal negative musculoskeletal ROS (+)   Abdominal   Peds  Hematology negative hematology ROS (+) anemia ,   Anesthesia Other Findings Day of surgery medications reviewed with the patient.  Reproductive/Obstetrics                             Anesthesia Physical  Anesthesia Plan  ASA: III  Anesthesia Plan: General   Post-op Pain Management:  Regional for Post-op pain   Induction: Intravenous  PONV Risk Score and Plan: 2 and Ondansetron, Dexamethasone and Scopolamine patch - Pre-op  Airway Management Planned: Oral ETT  Additional Equipment:   Intra-op Plan:   Post-operative Plan:   Informed Consent:   Plan Discussed with: Anesthesiologist  Anesthesia Plan Comments:         Anesthesia Quick Evaluation

## 2019-07-09 NOTE — Care Management Obs Status (Signed)
[facility] NOTIFICATION   Patient Details  Name: Robin Welch MRN: [id number] Date of Birth: 25-Oct-1965   Medicare Observation Status Notification Given:  Yes    Claudie Leach, RN 07/09/2019, 1:18 PM

## 2019-07-09 NOTE — Care Management CC44 (Signed)
Condition Code 44 Documentation Completed  Patient Details  Name: Mairen Bohan MRN: [id number] Date of Birth: [birth year]   Condition Code 44 given:  Yes Patient signature on Condition Code 44 notice:  Yes Documentation of 2 MD's agreement:  Yes Code 44 added to claim:  Yes    Claudie Leach, RN 07/09/2019, 1:18 PM

## 2019-07-10 ENCOUNTER — Telehealth: Payer: Self-pay

## 2019-07-10 DIAGNOSIS — Z1159 Encounter for screening for other viral diseases: Secondary | ICD-10-CM | POA: Diagnosis not present

## 2019-07-10 NOTE — Telephone Encounter (Signed)
Does not need TCM f/u visit here at this time. Just needs to f/u with ortho.  However plz call - I noted significant drop in hemoglobin in hospital compared to last month as well as elevated Kras count - any signs of infection (cough, UTI symptoms, diarrhea, cellulitis)? Any signs of blood loss in stool, urine, vaginal bleed?  plz come in to pick up iFOB and if positive will need to return to GI for repeat colonoscopy.  Also with recent weight loss I think she is on too high a dose of seroquel - I would recommend she decrease dose to 400mg  nightly and f/u with psych.

## 2019-07-10 NOTE — Addendum Note (Signed)
Addended by: Ellamae Sia on: 07/10/2019 10:16 AM   Modules accepted: Orders

## 2019-07-10 NOTE — Discharge Summary (Signed)
Physician Discharge Summary      Patient ID: Robin Welch MRN: [id number] DOB/AGE: July 23, 1966 53 y.o.  Admit date: 07/08/2019 Discharge date: 07/09/2019  Admission Diagnoses:  Active Problems:   Closed fracture of right proximal humerus   Humerus fracture   Discharge Diagnoses:  Same  Surgeries: Procedure(s): OPEN REDUCTION INTERNAL FIXATION (ORIF) PROXIMAL HUMERUS FRACTURE on 07/08/2019 - 07/09/2019   Consultants:   Discharged Condition: Stable  Hospital Course: Robin Welch is an 53 y.o. female who was admitted 07/08/2019 with a chief complaint of right shoulder pain, and found to have a diagnosis of right proximal humerus fracture.  Surgery was initially planned for the patient in the form of ORIF but a CT scan of the right shoulder on 07/08/19 and an XR on 07/09/19 revealed that the fracture alignment had improved from initial XRs.  This, along with patient's history (smoking/osteoporosis/nonunion of previous operatively-fixed fracture) encouraged the orthopedic team to consider nonoperative treatment for now.  Patient agreed with the plan.  She was discharged home on 07/09/19, with instructions to follow-up in clinic to monitor fracture healing.  Potentially will require surgical fixation in the future but a trial of nonoperative management is appropriate for this patient.  She will f/u with Dr. Marlou Sa in clinic in ~1 week.   Antibiotics given:  Anti-infectives (From admission, onward)   Start     Dose/Rate Route Frequency Ordered Stop   07/09/19 0600  ceFAZolin (ANCEF) IVPB 2g/100 mL premix  Status:  Discontinued     2 g 200 mL/hr over 30 Minutes Intravenous On call to O.R. 07/08/19 2039 07/09/19 1731    .  Recent vital signs:  Vitals:   07/09/19 0422 07/09/19 0811  BP: 93/67 95/66  Pulse: (!) 106 82  Resp:  17  Temp: 97.6 F (36.4 C) 98.1 F (36.7 C)  SpO2: 98% 99%    Recent laboratory studies:  Results for orders placed or performed during the hospital encounter of  07/08/19  SARS CORONAVIRUS 2 (TAT 6-24 HRS) Nasopharyngeal Nasopharyngeal Swab   Specimen: Nasopharyngeal Swab  Result Value Ref Range   SARS Coronavirus 2 NEGATIVE NEGATIVE  Surgical pcr screen   Specimen: Nasal Mucosa; Nasal Swab  Result Value Ref Range   MRSA, PCR NEGATIVE NEGATIVE   Staphylococcus aureus NEGATIVE NEGATIVE  Basic metabolic panel  Result Value Ref Range   Sodium 134 (L) 135 - 145 mmol/L   Potassium 3.7 3.5 - 5.1 mmol/L   Chloride 105 98 - 111 mmol/L   CO2 19 (L) 22 - 32 mmol/L   Glucose, Bld 105 (H) 70 - 99 mg/dL   BUN 8 6 - 20 mg/dL   Creatinine, Ser 0.46 0.44 - 1.00 mg/dL   Calcium 7.5 (L) 8.9 - 10.3 mg/dL   GFR calc non Af Amer >60 >60 mL/min   GFR calc Af Amer >60 >60 mL/min   Anion gap 10 5 - 15  CBC with Differential  Result Value Ref Range   WBC 17.7 (H) 4.0 - 10.5 K/uL   RBC 2.96 (L) 3.87 - 5.11 MIL/uL   Hemoglobin 8.8 (L) 12.0 - 15.0 g/dL   HCT 27.5 (L) 36.0 - 46.0 %   MCV 92.9 80.0 - 100.0 fL   MCH 29.7 26.0 - 34.0 pg   MCHC 32.0 30.0 - 36.0 g/dL   RDW 16.4 (H) 11.5 - 15.5 %   Platelets 512 (H) 150 - 400 K/uL   nRBC 0.0 0.0 - 0.2 %   Neutrophils Relative % 78 %  Neutro Abs 13.8 (H) 1.7 - 7.7 K/uL   Lymphocytes Relative 7 %   Lymphs Abs 1.3 0.7 - 4.0 K/uL   Monocytes Relative 8 %   Monocytes Absolute 1.4 (H) 0.1 - 1.0 K/uL   Eosinophils Relative 7 %   Eosinophils Absolute 1.2 (H) 0.0 - 0.5 K/uL   Basophils Relative 0 %   Basophils Absolute 0.0 0.0 - 0.1 K/uL   Immature Granulocytes 0 %   Abs Immature Granulocytes 0.07 0.00 - 0.07 K/uL  HIV Antibody (routine testing w rflx)  Result Value Ref Range   HIV Screen 4th Generation wRfx NON REACTIVE NON REACTIVE    Discharge Medications:   Allergies as of 07/09/2019      Reactions   Prozac [fluoxetine Hcl] Swelling, Rash   Doxycycline Nausea And Vomiting   Celexa [citalopram Hydrobromide] Other (See Comments)   Possible allergic reaction   Chantix [varenicline Tartrate] Swelling   Mouth  swelling   Hydroxyzine Other (See Comments)   Not effective   Klonopin [clonazepam] Other (See Comments)   Caused instablility. Not able to drive due to over medicated   Lunesta [eszopiclone] Other (See Comments)   Not effective   Prozac [fluoxetine Hcl] Swelling   Sonata [zaleplon] Other (See Comments)   Not effective   Trazodone And Nefazodone Other (See Comments)   ineffective   Zolpimist [zolpidem Tartrate] Other (See Comments)   Not effective   Azithromycin Other (See Comments)   Reaction not recalled by the patient      Medication List    STOP taking these medications   dexlansoprazole 60 MG capsule Commonly known as: DEXILANT   hydrOXYzine 25 MG tablet Commonly known as: ATARAX/VISTARIL   polyethylene glycol 17 g packet Commonly known as: MiraLax     TAKE these medications   albuterol 108 (90 Base) MCG/ACT inhaler Commonly known as: Ventolin HFA Inhale 1-2 puffs into the lungs every 6 (six) hours as needed for wheezing or shortness of breath.   ALPRAZolam 1 MG tablet Commonly known as: XANAX Take 1 tablet by mouth daily as needed for anxiety.   aspirin 81 MG chewable tablet Chew 1 tablet (81 mg total) by mouth daily.   calcium carbonate 600 MG Tabs tablet Commonly known as: Calcium 600 Take 1 tablet (600 mg total) by mouth daily with breakfast.   methocarbamol 500 MG tablet Commonly known as: Robaxin Take 1 tablet (500 mg total) by mouth every 8 (eight) hours as needed for muscle spasms.   ondansetron 4 MG disintegrating tablet Commonly known as: ZOFRAN-ODT Take 1 tablet (4 mg total) by mouth every 8 (eight) hours as needed for nausea or vomiting.   oxyCODONE 5 MG immediate release tablet Commonly known as: Oxy IR/ROXICODONE Take 1 tablet (5 mg total) by mouth every 4 (four) hours as needed for moderate pain (pain score 4-6).   QUEtiapine 400 MG tablet Commonly known as: SEROQUEL Take 800 mg by mouth at bedtime. What changed: Another medication  with the same name was removed. Continue taking this medication, and follow the directions you see here.   Spiriva Respimat 2.5 MCG/ACT Aers Generic drug: Tiotropium Bromide Monohydrate Inhale 1 puff into the lungs daily.   sucralfate 1 GM/10ML suspension Commonly known as: Carafate Take 10 mLs (1 g total) by mouth 4 (four) times daily.   Vitamin D 50 MCG (2000 UT) Caps Take 1 capsule (2,000 Units total) by mouth daily.       Diagnostic Studies: Dg Shoulder Right  Result  Date: 07/09/2019 CLINICAL DATA:  Proximal humeral fracture. EXAM: RIGHT SHOULDER - 2+ VIEW COMPARISON:  CT shoulder 07/08/2019 FINDINGS: Generalized osteopenia. Minimally displaced fracture of the surgical neck of the right proximal humerus with the fracture involving the greater tuberosity. No other acute fracture or dislocation. No aggressive osseous lesion. IMPRESSION: Minimally displaced fracture of the surgical neck of the right proximal humerus with the fracture involving the greater tuberosity. Electronically Signed   By: Kathreen Devoid   On: 07/09/2019 14:08   Ct Head Wo Contrast  Result Date: 06/21/2019 CLINICAL DATA:  Ataxia Frequent falls in the past several weeks per pt Seizures as a baby one episode per pt No hx of stroke per pt Ataxia, stroke suspected EXAM: CT HEAD WITHOUT CONTRAST TECHNIQUE: Contiguous axial images were obtained from the base of the skull through the vertex without intravenous contrast. COMPARISON:  None. FINDINGS: Brain: No acute intracranial hemorrhage. No focal mass lesion. No CT evidence of acute infarction. No midline shift or mass effect. No hydrocephalus. Basilar cisterns are patent. Vascular: No hyperdense vessel or unexpected calcification. Skull: Normal. Negative for fracture or focal lesion. Sinuses/Orbits: Paranasal sinuses and mastoid air cells are clear. Orbits are clear. Other: None. IMPRESSION: Normal head CT. Electronically Signed   By: Suzy Bouchard M.D.   On: 06/21/2019  15:54   Mr Brain Wo Contrast (neuro Protocol)  Result Date: 06/21/2019 CLINICAL DATA:  Ataxia.  Frequent falling. EXAM: MRI HEAD WITHOUT CONTRAST TECHNIQUE: Multiplanar, multiecho pulse sequences of the brain and surrounding structures were obtained without intravenous contrast. COMPARISON:  Head CT same day FINDINGS: Brain: The brain has a normal appearance without evidence of malformation, atrophy, old or acute small or large vessel infarction, mass lesion, hemorrhage, hydrocephalus or extra-axial collection. Vascular: Major vessels at the base of the brain show flow. Venous sinuses appear patent. Skull and upper cervical spine: Normal. Sinuses/Orbits: Clear/normal. Other: None significant. IMPRESSION: Normal examination. No abnormality seen to explain the clinical presentation. Electronically Signed   By: Nelson Chimes M.D.   On: 06/21/2019 18:38   Ct Shoulder Right Wo Contrast  Result Date: 07/08/2019 CLINICAL DATA:  Fracture of the humerus. EXAM: CT OF THE UPPER RIGHT EXTREMITY WITHOUT CONTRAST TECHNIQUE: Multidetector CT imaging of the upper right extremity was performed according to the standard protocol. COMPARISON:  July 08, 2019 FINDINGS: Bones/Joint/Cartilage Again noted is an acute impacted of the proximal right humerus. The fracture planes extend to the greater tubercle and into the lesser tubercle as well. There is no fracture of the glenoid. There is a possible partially visualized fracture involving the anterior fifth rib on the right. Ligaments Suboptimally assessed by CT. Muscles and Tendons No significant intramuscular hematoma. Soft tissues There is a moderate-sized joint effusion. There is a trace right-sided pleural effusion that is partially visualized. Emphysematous changes are noted involving the partially visualized right lung. There is a small airspace opacity in the posterior right upper lobe. IMPRESSION: 1. Acute fracture of the proximal right humerus as detailed above. 2.  Possible fracture of the anterior fifth rib on the right. This is only partially visualized. 3. Emphysema. Airspace opacity in the right upper lobe may represent atelectasis or developing infiltrate. 4. Trace right-sided pleural effusion that is only partially visualized. Electronically Signed   By: Constance Holster M.D.   On: 07/08/2019 22:54   Ct Wrist Right Wo Contrast  Result Date: 07/04/2019 CLINICAL DATA:  Follow-up radius and ulnar fractures. Evaluate healing. EXAM: CT OF THE RIGHT WRIST WITHOUT CONTRAST TECHNIQUE:  Multidetector CT imaging of the right wrist was performed according to the standard protocol. Multiplanar CT image reconstructions were also generated. COMPARISON:  Radiographs 04/08/2019 FINDINGS: There is a long compression plate on the ulnar aspect of the ulna with multiple screws. I do not see any solid osseous bridging at the fracture site. Some attempted callus formation along the radial aspect of the ulna but it does appear to be bridging. There is a volar radial plate and multiple screws transfixing the distal radius fracture. I do not see any solid healing changes. Some areas of attempted bony ingrowth but no solid union. Also, I do not see any articular bone covering the distal screws which appear to be in the joint. Widened scapholunate joint space suggesting ligamentous disruption. There is moderate osteoporosis noted. IMPRESSION: 1. No significant healing changes involving the ulnar fracture. No bony ingrowth or osseous bridging is identified. 2. No significant healing of the distal radius fracture. Significant bony resorptive changes. I do not see any articular bone covering the distal screws some of which protrude into the joint. Electronically Signed   By: Marijo Sanes M.D.   On: 07/04/2019 20:21   Dg Abd 2 Views  Result Date: 06/21/2019 CLINICAL DATA:  Abdominal pain and recent falls EXAM: ABDOMEN - 2 VIEW COMPARISON:  None. FINDINGS: Scattered large and small bowel gas  is noted. Retained fecal material consistent with a degree of constipation is noted. No impaction is seen. Postsurgical changes are noted in the right upper quadrant. No free air is seen. No bony abnormality is noted. IMPRESSION: Changes of constipation. Electronically Signed   By: Inez Catalina M.D.   On: 06/21/2019 15:19   Dg Humerus Right  Result Date: 07/08/2019 CLINICAL DATA:  Pt tripped and fell over her coffee table today and landed on her right shoulder. No prior injuries or surgeries. EXAM: RIGHT HUMERUS - 2+ VIEW COMPARISON:  None. FINDINGS: Significantly displaced and impacted fracture of the right humeral neck. There is apparent involvement of the lesser tuberosity. The remainder of the right humerus appears intact. There is a nondisplaced right rib fracture. No definite pneumothorax. IMPRESSION: 1. Displaced and impacted fracture of the right humeral neck with apparent involvement of the lesser tuberosity. 2. Nondisplaced right rib fracture.  No definite pneumothorax. Electronically Signed   By: Audie Pinto M.D.   On: 07/08/2019 14:42    Disposition:   Discharge Instructions    Call MD / Call 911   Complete by: As directed    If you experience chest pain or shortness of breath, CALL 911 and be transported to the hospital emergency room.  If you develope a fever above 101 F, pus (Yasin drainage) or increased drainage or redness at the wound, or calf pain, call your surgeon's office.   Constipation Prevention   Complete by: As directed    Drink plenty of fluids.  Prune juice may be helpful.  You may use a stool softener, such as Colace (over the counter) 100 mg twice a day.  Use MiraLax (over the counter) for constipation as needed.   Diet - low sodium heart healthy   Complete by: As directed    Discharge instructions   Complete by: As directed    No lifting with the injured shoulder. Continue use of the sling at all times of day.  Make sure the sling is loose and not extremely  tight and causing your shoulder to ride up.  Follow-up with Dr. Marlou Sa in 1 week on 07/17/2019.  Call the office at the given phone number in your discharge paperwork.   Increase activity slowly as tolerated   Complete by: As directed       Follow-up Information    Marlou Sa, Tonna Corner, MD. Schedule an appointment as soon as possible for a visit in 1 week(s).   Specialty: Orthopedic Surgery Why: Call the office after 8 AM on 07/10/2019 to schedule a followup appointment with Dr. Marlou Sa on 07/17/2019.   Contact information: [place] [address] [phone]            Signed: Donella Stade 07/10/2019, 8:53 AM

## 2019-07-10 NOTE — Telephone Encounter (Signed)
Patient was at the hospital for humerus fracture, D/C notes to follow up with orthopedic. If I read it correctly patient did not have to have a surgery for this? Please review. Does patient need TCM and follow up here or just ortho? Thank you

## 2019-07-11 LAB — HEPATITIS C ANTIBODY
Hepatitis C Ab: NONREACTIVE
SIGNAL TO CUT-OFF: 0.01

## 2019-07-11 NOTE — Telephone Encounter (Signed)
Spoke with patient and advised of everything. Patient will call ortho to schedule follow up, has not done this yet. Patient states no abnormal bleeding present but she has noticed that her urine has been dark and cloudy for the past 4 to 5 days. I offered to make an appointment to get her symptom evaluated and patient said she could not schedule right now due to not driving with the fracture. I explained to patient the importance of getting this evaluated so she does not become sicker with this and patient verbalized understanding. Patient states she will schedule for later this week or next week. I did advise patient that we would follow up next week again if we did not see an appointment scheduled. Patient verbalized understanding.  FYI to PCP

## 2019-07-13 ENCOUNTER — Other Ambulatory Visit: Payer: Self-pay

## 2019-07-13 ENCOUNTER — Ambulatory Visit (INDEPENDENT_AMBULATORY_CARE_PROVIDER_SITE_OTHER): Payer: Medicare HMO | Admitting: Family Medicine

## 2019-07-13 VITALS — BP 118/66 | HR 105 | Temp 97.7°F | Ht 62.0 in | Wt 111.1 lb

## 2019-07-13 DIAGNOSIS — K920 Hematemesis: Secondary | ICD-10-CM

## 2019-07-13 DIAGNOSIS — E43 Unspecified severe protein-calorie malnutrition: Secondary | ICD-10-CM

## 2019-07-13 DIAGNOSIS — R112 Nausea with vomiting, unspecified: Secondary | ICD-10-CM

## 2019-07-13 DIAGNOSIS — M8000XG Age-related osteoporosis with current pathological fracture, unspecified site, subsequent encounter for fracture with delayed healing: Secondary | ICD-10-CM

## 2019-07-13 DIAGNOSIS — K2101 Gastro-esophageal reflux disease with esophagitis, with bleeding: Secondary | ICD-10-CM

## 2019-07-13 DIAGNOSIS — R8290 Unspecified abnormal findings in urine: Secondary | ICD-10-CM | POA: Diagnosis not present

## 2019-07-13 DIAGNOSIS — R634 Abnormal weight loss: Secondary | ICD-10-CM | POA: Diagnosis not present

## 2019-07-13 DIAGNOSIS — F1421 Cocaine dependence, in remission: Secondary | ICD-10-CM

## 2019-07-13 DIAGNOSIS — S52501A Unspecified fracture of the lower end of right radius, initial encounter for closed fracture: Secondary | ICD-10-CM

## 2019-07-13 DIAGNOSIS — R296 Repeated falls: Secondary | ICD-10-CM

## 2019-07-13 DIAGNOSIS — R82998 Other abnormal findings in urine: Secondary | ICD-10-CM | POA: Diagnosis not present

## 2019-07-13 DIAGNOSIS — S42201A Unspecified fracture of upper end of right humerus, initial encounter for closed fracture: Secondary | ICD-10-CM

## 2019-07-13 DIAGNOSIS — S52601A Unspecified fracture of lower end of right ulna, initial encounter for closed fracture: Secondary | ICD-10-CM

## 2019-07-13 DIAGNOSIS — F319 Bipolar disorder, unspecified: Secondary | ICD-10-CM | POA: Diagnosis not present

## 2019-07-13 DIAGNOSIS — D500 Iron deficiency anemia secondary to blood loss (chronic): Secondary | ICD-10-CM | POA: Diagnosis not present

## 2019-07-13 LAB — CBC WITH DIFFERENTIAL/PLATELET
Basophils Absolute: 0.1 10*3/uL (ref 0.0–0.1)
Basophils Relative: 1 % (ref 0.0–3.0)
Eosinophils Absolute: 1.3 10*3/uL — ABNORMAL HIGH (ref 0.0–0.7)
Eosinophils Relative: 13.6 % — ABNORMAL HIGH (ref 0.0–5.0)
HCT: 26.4 % — ABNORMAL LOW (ref 36.0–46.0)
Hemoglobin: 8.7 g/dL — ABNORMAL LOW (ref 12.0–15.0)
Lymphocytes Relative: 14.5 % (ref 12.0–46.0)
Lymphs Abs: 1.4 10*3/uL (ref 0.7–4.0)
MCHC: 33.1 g/dL (ref 30.0–36.0)
MCV: 90.2 fl (ref 78.0–100.0)
Monocytes Absolute: 0.6 10*3/uL (ref 0.1–1.0)
Monocytes Relative: 6.2 % (ref 3.0–12.0)
Neutro Abs: 6.4 10*3/uL (ref 1.4–7.7)
Neutrophils Relative %: 64.7 % (ref 43.0–77.0)
Platelets: 594 10*3/uL — ABNORMAL HIGH (ref 150.0–400.0)
RBC: 2.93 Mil/uL — ABNORMAL LOW (ref 3.87–5.11)
RDW: 17.3 % — ABNORMAL HIGH (ref 11.5–15.5)
WBC: 9.8 10*3/uL (ref 4.0–10.5)

## 2019-07-13 LAB — POC URINALSYSI DIPSTICK (AUTOMATED)
Bilirubin, UA: NEGATIVE
Blood, UA: NEGATIVE
Glucose, UA: NEGATIVE
Ketones, UA: NEGATIVE
Nitrite, UA: NEGATIVE
Protein, UA: NEGATIVE
Spec Grav, UA: 1.01 (ref 1.010–1.025)
Urobilinogen, UA: 0.2 E.U./dL
pH, UA: 7 (ref 5.0–8.0)

## 2019-07-13 LAB — COMPREHENSIVE METABOLIC PANEL
ALT: 114 U/L — ABNORMAL HIGH (ref 0–35)
AST: 25 U/L (ref 0–37)
Albumin: 2 g/dL — ABNORMAL LOW (ref 3.5–5.2)
Alkaline Phosphatase: 405 U/L — ABNORMAL HIGH (ref 39–117)
BUN: 8 mg/dL (ref 6–23)
CO2: 25 mEq/L (ref 19–32)
Calcium: 7.5 mg/dL — ABNORMAL LOW (ref 8.4–10.5)
Chloride: 102 mEq/L (ref 96–112)
Creatinine, Ser: 0.39 mg/dL — ABNORMAL LOW (ref 0.40–1.20)
GFR: 170.1 mL/min (ref >60.00)
Glucose, Bld: 87 mg/dL (ref 70–99)
Potassium: 4.4 mEq/L (ref 3.5–5.1)
Sodium: 132 mEq/L — ABNORMAL LOW (ref 135–145)
Total Bilirubin: 0.5 mg/dL (ref 0.2–1.2)
Total Protein: 4.6 g/dL — ABNORMAL LOW (ref 6.0–8.3)

## 2019-07-13 MED ORDER — CEPHALEXIN 500 MG PO CAPS: 500.0000 mg | ORAL_CAPSULE | Freq: Two times a day (BID) | ORAL | 0 refills | Status: DC

## 2019-07-13 MED ORDER — DEXLANSOPRAZOLE 60 MG PO CPDR: 60.0000 mg | DELAYED_RELEASE_CAPSULE | Freq: Every day | ORAL | 6 refills | Status: DC
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
## 2019-07-13 NOTE — Patient Instructions (Addendum)
Blood counts were lower in the hospital - concern over blood loss from vomiting vs hidden blood in stool. Pass by lab to pick up stool kit. Continue good protein intake.  We will refer you for home health.  Possible urine infection - start keflex twice daily for 1 week. We will await urine culture results as well.

## 2019-07-14 ENCOUNTER — Telehealth: Payer: Self-pay | Admitting: Family Medicine

## 2019-07-14 ENCOUNTER — Encounter: Payer: Self-pay | Admitting: Orthopedic Surgery

## 2019-07-14 ENCOUNTER — Ambulatory Visit (INDEPENDENT_AMBULATORY_CARE_PROVIDER_SITE_OTHER): Payer: Medicare HMO | Admitting: Orthopedic Surgery

## 2019-07-14 DIAGNOSIS — S42291A Other displaced fracture of upper end of right humerus, initial encounter for closed fracture: Secondary | ICD-10-CM

## 2019-07-14 LAB — URINE CULTURE
MICRO NUMBER:: 1094093
SPECIMEN QUALITY:: ADEQUATE

## 2019-07-14 LAB — PREALBUMIN: Prealbumin: 10 mg/dL — ABNORMAL LOW (ref 17–34)

## 2019-07-14 MED ORDER — OXYCODONE HCL 5 MG PO TABS: ORAL_TABLET | ORAL | 0 refills | Status: DC

## 2019-07-14 NOTE — Progress Notes (Signed)
Post-Op Visit Note   Patient: Robin Welch           Date of Birth: 20-Dec-1965           MRN: [id number] Visit Date: 07/14/2019 PCP: Robin Bush, MD   Assessment & Plan:  Chief Complaint:  Chief Complaint  Patient presents with  . Right Shoulder - Follow-up, Fracture   Visit Diagnoses:  1. Closed 3-part fracture of proximal end of right humerus, initial encounter     Plan: Robin Welch follows up with right humeral head fracture sustained 07/08/2019.  She has multiple mitigating factors against operative intervention including malnutrition smoking osteoporosis and low vitamin D level.  She states she just started vitamin D3.  She is gained 6 pounds this past week.  She also has delayed union of right wrist fracture.  On exam she does have some coarseness and grinding with passive range of motion.  I think is highly unlikely that this will heal with operative fixation at this time due to all of the mitigating factors described above.  Also the fact that she had a distal radius fracture with good alignment and plate fixation which also has gone on to have problems argues against operative ORIF at this time.  Plan refill oxycodone and see her back in 2 weeks with x-rays on return.  Alignment was generally intact on CT scan obtained in the hospital.  If it heals in that position she may do okay if not and she goes on to nonunion I think she will likely need a reverse shoulder replacement.  Follow-Up Instructions: No follow-ups on file.   Orders:  No orders of the defined types were placed in this encounter.  Meds ordered this encounter  Medications  . oxyCODONE (OXY IR/ROXICODONE) 5 MG immediate release tablet    Sig: 1 po q 4-6hrs prn pain    Dispense:  40 tablet    Refill:  0    Imaging: No results found.  PMFS History: Patient Active Problem List   Diagnosis Date Noted  . Humerus fracture 07/09/2019  . Protein-calorie malnutrition ([place]) 07/08/2019  . Ataxia 07/08/2019  .  Closed fracture of right proximal humerus 07/08/2019  . Hypocalcemia 07/06/2019  . Pulmonary nodule less than 1 cm in diameter with moderate to high risk for malignant neoplasm 06/01/2019  . Closed fracture distal radius and ulna, right, initial encounter 04/20/2019  . Diarrhea of presumed infectious origin 01/31/2019  . GAD (generalized anxiety disorder) 09/02/2018  . Insomnia 09/02/2018  . Low serum vitamin B12 07/19/2018  . Memory loss 06/20/2018  . Closed nondisplaced fracture of third metatarsal bone of left foot 06/13/2018  . Osteoporosis 06/09/2018  . Chronic constipation 04/19/2018  . Gastroesophageal reflux disease 04/19/2018  . Hematemesis 04/19/2018  . Thrush 06/21/2017  . Chronic hyponatremia 03/11/2017  . Iron deficiency anemia 03/11/2017  . Thrombocytosis ([place]) 03/02/2017  . Non-intractable vomiting with nausea 03/01/2017  . History of cocaine dependence ([place])   . Migraine 05/08/2015  . Hoarseness of voice 04/04/2015  . Weight loss, unintentional 03/08/2015  . COPD (chronic obstructive pulmonary disease) with chronic bronchitis ([place]) 09/22/2013  . Tobacco abuse 12/18/2010  . Bipolar 1 disorder ([place]) 12/18/2010   Past Medical History:  Diagnosis Date  . Anxiety   . Barrett esophagus   . Bipolar 1 disorder [facility])    sees Dr. Candis Schatz psych [phone])  . Closed fracture distal radius and ulna, left, initial encounter 04/20/2019   S/p ORIF 04/2019 Robin Welch)   .  COPD (chronic obstructive pulmonary disease) ([place]) 03/2013   hyperinflation by CXR  . History of cocaine dependence ([place]) latest 2020   undergoing detox  . History of stomach ulcers   . Hx: UTI (urinary tract infection)   . Migraine    "stopped in ~ 2008" (03/11/2017)  . Muscle strain of gluteal region, right, subsequent encounter 10/05/2018   By MRI 2020  . Osteoporosis 06/09/2018   DEXA 05/2017 - T score -2.5 L hip, -1.3 spine  . Osteoporosis   . Positive PPD 1989   s/p CXR and INH 6 mo  . Seizure  ([place]) X 1   "from takiing too many headache medicine?" (03/11/2017)  . Smoker     Family History  Problem Relation Age of Onset  . Diabetes Mother        T2DM  . Breast cancer Mother 73  . Dementia Mother   . Lymphoma Mother   . Coronary artery disease Father 74       MI  . Anxiety disorder Paternal Aunt   . Anxiety disorder Cousin   . Depression Cousin   . ADD / ADHD Child   . Anxiety disorder Child   . Depression Child   . Stroke Neg Hx   . Colon cancer Neg Hx   . Cancer - Colon Neg Hx     Past Surgical History:  Procedure Laterality Date  . COLONOSCOPY  07/2018   poor colon prep - rec repeat ([place])  . DILATION AND CURETTAGE OF UTERUS  X 1   S/P miscarriage  . ESOPHAGOGASTRODUODENOSCOPY  03/2017   candidal esophagitis, treated, LA grade D esophagitis, 4cm HH ([place])  . ESOPHAGOGASTRODUODENOSCOPY  07/2018   severe reflux esophagitis, neg candida ([place])  . LAPAROSCOPIC CHOLECYSTECTOMY  2006  . ORIF WRIST FRACTURE Right 04/08/2019   Procedure: OPEN REDUCTION INTERNAL FIXATION (ORIF) DISTAL RADIUS/ULNA FRACTURE;  Surgeon: Verner Mould, MD;  Location: [place];  Service: Orthopedics;  Laterality: Right;  . TUBAL LIGATION  2003   Social History   Occupational History  . 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 Smoking status: Current Every Day Smoker    Packs/day: 1.00    Years: 36.00    Pack years: 36.00    Types: Cigarettes  . Smokeless tobacco: Never Used  Substance and Sexual Activity  . Alcohol use: Not Currently    Alcohol/week: 0.0 standard drinks    Comment: 03/11/2017 "stopped 09/18/2015; never an alcoholic"  . Drug use: Yes    Types: Cocaine    Comment: patient sttes daily use  . Sexual activity: Never    Birth control/protection: Surgical

## 2019-07-14 NOTE — Telephone Encounter (Signed)
Spoke with pt informing her she will be contacted about setting up [place].  Pt verbalizes understanding.

## 2019-07-14 NOTE — Telephone Encounter (Signed)
Patient was in yesterday to see Dr Darnell Level. She stated that they discussed home health services. Patient has several questions she would like to ask  Will we set her up with someone?   How often can she use them? Everyday? Once a week?   There was a few more questions but she stated she would wait until she hears back from someone

## 2019-07-15 ENCOUNTER — Encounter: Payer: Self-pay | Admitting: Family Medicine

## 2019-07-15 DIAGNOSIS — R296 Repeated falls: Secondary | ICD-10-CM | POA: Insufficient documentation

## 2019-07-15 DIAGNOSIS — R8290 Unspecified abnormal findings in urine: Secondary | ICD-10-CM | POA: Insufficient documentation

## 2019-07-15 NOTE — Assessment & Plan Note (Signed)
Poorly healing ulnar fracture earlier in the year now with proximal humeral fracture after recurrent fall. Planned non-operative management given poor result with ulnar fracture surgery earlier this year. Malnutrition contributes. Has f/u planned with ortho. These fractures are severely limiting her independence at home, will ask for home health evaluation (PT, nursing aide, social worker). Mental health may contribute to difficulty with care.

## 2019-07-15 NOTE — Assessment & Plan Note (Signed)
Endorsing dark cloudy urine over last few weeks with possible dysuria - UA abnormal. Will start keflex antibiotic and send urine culture.

## 2019-07-15 NOTE — Assessment & Plan Note (Signed)
Making more concerted effort at better nutrition with 5 lb weight gain noted in the past week. Congratulated. Continue protein supplement shake daily
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
## 2019-07-15 NOTE — Assessment & Plan Note (Signed)
Needs to restart dexilant.

## 2019-07-15 NOTE — Assessment & Plan Note (Signed)
H/o polysubstance abuse with recent relapse. She recently completed rehab at [facility] treatment facility.

## 2019-07-15 NOTE — Assessment & Plan Note (Addendum)
Given marked weight loss and ongoing unsteadiness, I have asked her to decrease seroquel to 400mg  nightly. I will try to touch base with psych regarding patient Robin Headings NP at [facility]). She has difficulty affording f/u.

## 2019-07-15 NOTE — Assessment & Plan Note (Addendum)
Acute deterioration over the last 2 weeks. Endorses hematemesis - reviewed ER precautions. Will send back to GI. Needs to start PPI. Check iFOB. See below re:pt concerns over procedural sedation.

## 2019-07-15 NOTE — Assessment & Plan Note (Signed)
Check prealbumin. Continue daily boost/ensure.

## 2019-07-15 NOTE — Telephone Encounter (Signed)
HH referral placed - can we expedite referral? Thanks

## 2019-07-15 NOTE — Assessment & Plan Note (Signed)
Severe as evidenced by recurrent fractures, want to improve calcium levels prior to starting treatment.

## 2019-07-15 NOTE — Assessment & Plan Note (Signed)
Refer to [facility] for balance eval

## 2019-07-15 NOTE — Assessment & Plan Note (Signed)
S/p ORIF 04/2019 Jeannie Fend), poorly healing

## 2019-07-15 NOTE — Assessment & Plan Note (Signed)
Endorses ongoing vomiting with blood. H/o severe esophagitis only on carafate. Had not started dexilant - again encouraged she fill this. She states she will go to pharmacy and start. Has had difficulty affording GI f/u but needs to return to GI.

## 2019-07-15 NOTE — Assessment & Plan Note (Signed)
Related to malnutrition. Hesitant for osteoporosis treatment until hypocalcemia is improved.

## 2019-07-17 ENCOUNTER — Telehealth: Payer: Self-pay | Admitting: Gastroenterology

## 2019-07-17 ENCOUNTER — Other Ambulatory Visit: Payer: Self-pay | Admitting: Family Medicine

## 2019-07-17 DIAGNOSIS — R634 Abnormal weight loss: Secondary | ICD-10-CM

## 2019-07-17 DIAGNOSIS — R7401 Elevation of levels of liver transaminase levels: Secondary | ICD-10-CM

## 2019-07-17 NOTE — Telephone Encounter (Addendum)
Please advise patient to come to ER for evaluation of hematemesis, significant drop in hgb 12--->8 based on labs last week.  She may need inpatient admission and EGD/further GI work-up.  Thank you

## 2019-07-17 NOTE — Telephone Encounter (Signed)
Called and spoke with pt and she is aware of Dr. Woodward Ku recommendations.Pt states she has an [place] scheduled for tomorrow and she will go then to the ER. Called and left message for Rosaria Ferries at [facility] regarding referral and recommendation.

## 2019-07-17 NOTE — Telephone Encounter (Signed)
Pt being referred back to GI from PCP for urgent referral. Anemia, hematemesis, wt loss. There are no midlevel appts available this week and your scheduled is full also. Please advise regarding scheduling.

## 2019-07-17 NOTE — Telephone Encounter (Signed)
Working on Urgent [facility] Referral

## 2019-07-18 ENCOUNTER — Inpatient Hospital Stay (HOSPITAL_COMMUNITY)
Admission: EM | Admit: 2019-07-18 | Discharge: 2019-07-20 | DRG: 391 | Disposition: A | Discharge status: Home Health | Payer: Medicare HMO | Attending: Internal Medicine | Admitting: Internal Medicine

## 2019-07-18 ENCOUNTER — Ambulatory Visit
Admission: RE | Admit: 2019-07-18 | Discharge: 2019-07-18 | Disposition: A | Discharge status: Home / Self Care | Payer: Medicare HMO | Source: Ambulatory Visit | Attending: Family Medicine | Admitting: Family Medicine

## 2019-07-18 ENCOUNTER — Encounter (HOSPITAL_COMMUNITY): Payer: Self-pay

## 2019-07-18 ENCOUNTER — Other Ambulatory Visit: Payer: Self-pay

## 2019-07-18 DIAGNOSIS — Z791 Long term (current) use of non-steroidal anti-inflammatories (NSAID): Secondary | ICD-10-CM

## 2019-07-18 DIAGNOSIS — Z818 Family history of other mental and behavioral disorders: Secondary | ICD-10-CM | POA: Diagnosis not present

## 2019-07-18 DIAGNOSIS — Z807 Family history of other malignant neoplasms of lymphoid, hematopoietic and related tissues: Secondary | ICD-10-CM | POA: Diagnosis not present

## 2019-07-18 DIAGNOSIS — Z8249 Family history of ischemic heart disease and other diseases of the circulatory system: Secondary | ICD-10-CM

## 2019-07-18 DIAGNOSIS — K222 Esophageal obstruction: Principal | ICD-10-CM

## 2019-07-18 DIAGNOSIS — K922 Gastrointestinal hemorrhage, unspecified: Secondary | ICD-10-CM | POA: Diagnosis not present

## 2019-07-18 DIAGNOSIS — Z79899 Other long term (current) drug therapy: Secondary | ICD-10-CM

## 2019-07-18 DIAGNOSIS — Z881 Allergy status to other antibiotic agents status: Secondary | ICD-10-CM

## 2019-07-18 DIAGNOSIS — Z8719 Personal history of other diseases of the digestive system: Secondary | ICD-10-CM | POA: Diagnosis present

## 2019-07-18 DIAGNOSIS — Z8711 Personal history of peptic ulcer disease: Secondary | ICD-10-CM

## 2019-07-18 DIAGNOSIS — D62 Acute posthemorrhagic anemia: Secondary | ICD-10-CM | POA: Diagnosis present

## 2019-07-18 DIAGNOSIS — K2210 Ulcer of esophagus without bleeding: Secondary | ICD-10-CM

## 2019-07-18 DIAGNOSIS — F17210 Nicotine dependence, cigarettes, uncomplicated: Secondary | ICD-10-CM | POA: Diagnosis present

## 2019-07-18 DIAGNOSIS — Z803 Family history of malignant neoplasm of breast: Secondary | ICD-10-CM

## 2019-07-18 DIAGNOSIS — M810 Age-related osteoporosis without current pathological fracture: Secondary | ICD-10-CM | POA: Diagnosis present

## 2019-07-18 DIAGNOSIS — F319 Bipolar disorder, unspecified: Secondary | ICD-10-CM | POA: Diagnosis present

## 2019-07-18 DIAGNOSIS — Z833 Family history of diabetes mellitus: Secondary | ICD-10-CM | POA: Diagnosis not present

## 2019-07-18 DIAGNOSIS — Z20828 Contact with and (suspected) exposure to other viral communicable diseases: Secondary | ICD-10-CM | POA: Diagnosis present

## 2019-07-18 DIAGNOSIS — R296 Repeated falls: Secondary | ICD-10-CM | POA: Diagnosis present

## 2019-07-18 DIAGNOSIS — Z720 Tobacco use: Secondary | ICD-10-CM

## 2019-07-18 DIAGNOSIS — K2101 Gastro-esophageal reflux disease with esophagitis, with bleeding: Secondary | ICD-10-CM | POA: Diagnosis present

## 2019-07-18 DIAGNOSIS — K920 Hematemesis: Secondary | ICD-10-CM

## 2019-07-18 DIAGNOSIS — R748 Abnormal levels of other serum enzymes: Secondary | ICD-10-CM | POA: Diagnosis present

## 2019-07-18 DIAGNOSIS — Z79891 Long term (current) use of opiate analgesic: Secondary | ICD-10-CM | POA: Diagnosis not present

## 2019-07-18 DIAGNOSIS — R7401 Elevation of levels of liver transaminase levels: Secondary | ICD-10-CM

## 2019-07-18 DIAGNOSIS — D649 Anemia, unspecified: Secondary | ICD-10-CM | POA: Diagnosis present

## 2019-07-18 DIAGNOSIS — Z7982 Long term (current) use of aspirin: Secondary | ICD-10-CM | POA: Diagnosis not present

## 2019-07-18 DIAGNOSIS — F419 Anxiety disorder, unspecified: Secondary | ICD-10-CM | POA: Diagnosis present

## 2019-07-18 DIAGNOSIS — K449 Diaphragmatic hernia without obstruction or gangrene: Secondary | ICD-10-CM | POA: Diagnosis present

## 2019-07-18 DIAGNOSIS — J449 Chronic obstructive pulmonary disease, unspecified: Secondary | ICD-10-CM | POA: Diagnosis present

## 2019-07-18 DIAGNOSIS — R634 Abnormal weight loss: Secondary | ICD-10-CM

## 2019-07-18 DIAGNOSIS — W19XXXD Unspecified fall, subsequent encounter: Secondary | ICD-10-CM | POA: Diagnosis present

## 2019-07-18 DIAGNOSIS — Z885 Allergy status to narcotic agent status: Secondary | ICD-10-CM

## 2019-07-18 DIAGNOSIS — Z888 Allergy status to other drugs, medicaments and biological substances status: Secondary | ICD-10-CM

## 2019-07-18 DIAGNOSIS — S42211D Unspecified displaced fracture of surgical neck of right humerus, subsequent encounter for fracture with routine healing: Secondary | ICD-10-CM

## 2019-07-18 DIAGNOSIS — N390 Urinary tract infection, site not specified: Secondary | ICD-10-CM | POA: Diagnosis present

## 2019-07-18 DIAGNOSIS — Z716 Tobacco abuse counseling: Secondary | ICD-10-CM

## 2019-07-18 LAB — CBC
HCT: 27.5 % — ABNORMAL LOW (ref 36.0–46.0)
Hemoglobin: 8.3 g/dL — ABNORMAL LOW (ref 12.0–15.0)
MCH: 29 pg (ref 26.0–34.0)
MCHC: 30.2 g/dL (ref 30.0–36.0)
MCV: 96.2 fL (ref 80.0–100.0)
Platelets: 606 10*3/uL — ABNORMAL HIGH (ref 150–400)
RBC: 2.86 MIL/uL — ABNORMAL LOW (ref 3.87–5.11)
RDW: 16.7 % — ABNORMAL HIGH (ref 11.5–15.5)
WBC: 12 10*3/uL — ABNORMAL HIGH (ref 4.0–10.5)
nRBC: 0 % (ref 0.0–0.2)

## 2019-07-18 LAB — COMPREHENSIVE METABOLIC PANEL
ALT: 36 U/L (ref 0–44)
AST: 14 U/L — ABNORMAL LOW (ref 15–41)
Albumin: 1.8 g/dL — ABNORMAL LOW (ref 3.5–5.0)
Alkaline Phosphatase: 273 U/L — ABNORMAL HIGH (ref 38–126)
Anion gap: 6 (ref 5–15)
BUN: 10 mg/dL (ref 6–20)
CO2: 28 mmol/L (ref 22–32)
Calcium: 7.8 mg/dL — ABNORMAL LOW (ref 8.9–10.3)
Chloride: 99 mmol/L (ref 98–111)
Creatinine, Ser: 0.42 mg/dL — ABNORMAL LOW (ref 0.44–1.00)
GFR calc Af Amer: >60 mL/min (ref >60)
GFR calc non Af Amer: >60 mL/min (ref >60)
Glucose, Bld: 90 mg/dL (ref 70–99)
Potassium: 3.9 mmol/L (ref 3.5–5.1)
Sodium: 133 mmol/L — ABNORMAL LOW (ref 135–145)
Total Bilirubin: 0.5 mg/dL (ref 0.3–1.2)
Total Protein: 4.9 g/dL — ABNORMAL LOW (ref 6.5–8.1)

## 2019-07-18 LAB — PROTIME-INR
INR: 1 (ref 0.8–1.2)
Prothrombin Time: 13 seconds (ref 11.4–15.2)

## 2019-07-18 LAB — TYPE AND SCREEN
ABO/RH(D): A POS
Antibody Screen: NEGATIVE

## 2019-07-18 LAB — ABO/RH: ABO/RH(D): A POS

## 2019-07-18 LAB — HEMOGLOBIN AND HEMATOCRIT, BLOOD
HCT: 27.8 % — ABNORMAL LOW (ref 36.0–46.0)
Hemoglobin: 8.3 g/dL — ABNORMAL LOW (ref 12.0–15.0)

## 2019-07-18 LAB — SARS CORONAVIRUS 2 (TAT 6-24 HRS): SARS Coronavirus 2: NEGATIVE

## 2019-07-18 LAB — POC OCCULT BLOOD, ED: Fecal Occult Bld: NEGATIVE

## 2019-07-18 MED ORDER — TIOTROPIUM BROMIDE MONOHYDRATE 2.5 MCG/ACT IN AERS: 1.0000 | INHALATION_SPRAY | Freq: Every day | RESPIRATORY_TRACT | Status: DC

## 2019-07-18 MED ORDER — ACETAMINOPHEN 325 MG PO TABS: 650.0000 mg | ORAL_TABLET | Freq: Four times a day (QID) | ORAL | Status: DC | PRN

## 2019-07-18 MED ORDER — STERILE WATER FOR INJECTION IJ SOLN
INTRAMUSCULAR | Status: AC
  Administered 2019-07-18: 15:00:00 10 mL
  Filled 2019-07-18: qty 10

## 2019-07-18 MED ORDER — CALCIUM CARBONATE 1250 (500 CA) MG PO TABS
625.0000 mg | ORAL_TABLET | Freq: Every day | ORAL | Status: DC
  Administered 2019-07-20: 10:00:00 500 mg via ORAL
  Filled 2019-07-18 (×2): qty 1

## 2019-07-18 MED ORDER — CELECOXIB 200 MG PO CAPS: 400.0000 mg | ORAL_CAPSULE | ORAL | Status: DC

## 2019-07-18 MED ORDER — SENNOSIDES-DOCUSATE SODIUM 8.6-50 MG PO TABS: 1.0000 | ORAL_TABLET | Freq: Every evening | ORAL | Status: DC | PRN

## 2019-07-18 MED ORDER — METHOCARBAMOL 500 MG PO TABS
500.0000 mg | ORAL_TABLET | Freq: Three times a day (TID) | ORAL | Status: DC | PRN
  Administered 2019-07-18: 21:00:00 500 mg via ORAL
  Filled 2019-07-18: qty 1

## 2019-07-18 MED ORDER — ACETAMINOPHEN 650 MG RE SUPP: 650.0000 mg | Freq: Four times a day (QID) | RECTAL | Status: DC | PRN

## 2019-07-18 MED ORDER — VITAMIN D 25 MCG (1000 UNIT) PO TABS
2000.0000 [IU] | ORAL_TABLET | Freq: Every day | ORAL | Status: DC
  Administered 2019-07-18 – 2019-07-20 (×3): 2000 [IU] via ORAL
  Filled 2019-07-18 (×3): qty 2

## 2019-07-18 MED ORDER — OXYCODONE HCL 5 MG PO TABS
5.0000 mg | ORAL_TABLET | Freq: Four times a day (QID) | ORAL | Status: DC | PRN
  Administered 2019-07-18 – 2019-07-20 (×6): 5 mg via ORAL
  Filled 2019-07-18 (×6): qty 1

## 2019-07-18 MED ORDER — OXYCODONE HCL 5 MG PO TABS
5.0000 mg | ORAL_TABLET | Freq: Once | ORAL | Status: AC
  Administered 2019-07-18: 13:00:00 5 mg via ORAL
  Filled 2019-07-18: qty 1

## 2019-07-18 MED ORDER — SODIUM CHLORIDE 0.9 % IV SOLN
INTRAVENOUS | Status: DC
  Administered 2019-07-18: 21:00:00 via INTRAVENOUS

## 2019-07-18 MED ORDER — ACETAMINOPHEN 500 MG PO TABS: 1000.0000 mg | ORAL_TABLET | ORAL | Status: DC

## 2019-07-18 MED ORDER — QUETIAPINE FUMARATE 200 MG PO TABS
800.0000 mg | ORAL_TABLET | Freq: Every day | ORAL | Status: DC
  Administered 2019-07-18 – 2019-07-19 (×2): 800 mg via ORAL
  Filled 2019-07-18 (×3): qty 4
  Filled 2019-07-18 (×2): qty 8

## 2019-07-18 MED ORDER — SODIUM CHLORIDE 0.9 % IV BOLUS
1000.0000 mL | Freq: Once | INTRAVENOUS | Status: AC
  Administered 2019-07-18: 11:00:00 1000 mL via INTRAVENOUS

## 2019-07-18 MED ORDER — ONDANSETRON 4 MG PO TBDP: 4.0000 mg | ORAL_TABLET | Freq: Three times a day (TID) | ORAL | Status: DC | PRN

## 2019-07-18 MED ORDER — PANTOPRAZOLE SODIUM 40 MG IV SOLR
40.0000 mg | Freq: Two times a day (BID) | INTRAVENOUS | Status: DC
  Administered 2019-07-18 – 2019-07-20 (×5): 40 mg via INTRAVENOUS
  Filled 2019-07-18 (×5): qty 40

## 2019-07-18 MED ORDER — ALBUTEROL SULFATE (2.5 MG/3ML) 0.083% IN NEBU: 2.5000 mg | INHALATION_SOLUTION | Freq: Four times a day (QID) | RESPIRATORY_TRACT | Status: DC | PRN

## 2019-07-18 MED ORDER — SUCRALFATE 1 GM/10ML PO SUSP
1.0000 g | Freq: Three times a day (TID) | ORAL | Status: DC
  Administered 2019-07-18 – 2019-07-20 (×7): 1 g via ORAL
  Filled 2019-07-18 (×7): qty 10

## 2019-07-18 MED ORDER — UMECLIDINIUM BROMIDE 62.5 MCG/INH IN AEPB
1.0000 | INHALATION_SPRAY | Freq: Every day | RESPIRATORY_TRACT | Status: DC
  Administered 2019-07-19 – 2019-07-20 (×2): 1 via RESPIRATORY_TRACT
  Filled 2019-07-18: qty 7

## 2019-07-18 MED ORDER — QUETIAPINE FUMARATE 100 MG PO TABS: 400.0000 mg | ORAL_TABLET | Freq: Every day | ORAL | Status: DC

## 2019-07-18 MED ORDER — SCOPOLAMINE 1 MG/3DAYS TD PT72: 1.0000 | MEDICATED_PATCH | TRANSDERMAL | Status: DC

## 2019-07-18 NOTE — Consult Note (Addendum)
Consultation  Referring Provider: ER MD / Tomi Bamberger Primary Care Physician:  Ria Bush, MD Primary Gastroenterologist:  Dr.Nandigam  Reason for Consultation:  Anemia, hematemesis  HPI: Robin Welch is a 53 y.o. female, known to Dr. Silverio Decamp,, who had called our office last evening with complaints of intermittent hematemesis.  She was advised to go to the emergency room for evaluation.  She presented to the emergency room this morning. Patient also noted to have a 4 g drop in hemoglobin over the past 3 weeks. She has history of Barrett's esophagus and had severe erosive ulcerative esophagitis on EGD in October 2019.  Biopsies at that time showed densely inflamed squamous mucosa.  She was treated with twice daily PPI and Carafate. She had colonoscopy done in October 2019 as well which revealed a very poor prep and she was to be rescheduled with extended prep.  We have not seen her in our office since that time. She has history of anxiety, bipolar disorder, COPD, osteoporosis, and by review of chart polysubstance abuse with recent relapse for which she was hospitalized at a facility in [facility] recently. She had an admission here on 07/08/2019 after a fall at home at which time she sustained a right humeral fracture and required ORIF.  Her hemoglobin on 06/21/2019 was 12.1, on 07/08/2019 hemoglobin 8.8, on 07/13/2019 hemoglobin 8.7 and today hemoglobin 8.3. BUN 10/creatinine 0.42 Alk phos 273/AST 14/ALT 36 Occult negative Covid 19 pending  Abdominal ultrasound done earlier today absent gallbladder otherwise unremarkable study  Patient relates that she had not been on any acid blocker for multiple months.  After recent visit with her PCP she was started on Dexilant but says she actually just started that about 4 days ago.  She had also restarted some Carafate which it sounds like was an old prescription. Over the past 3 to 4 weeks she has had some heartburn, dysphagia and  odynophagia in addition to nausea at times and has been spitting up a tablespoon or so of blood off and on.  Appetite had been poor and she had lost some weight over the past month or 2 but says she has gained 6 pounds over the past week. She does use a couple of Advil per day denies any aspirin use BCs or Goody's.  She denies any active EtOH use.  She tells me she has not relapsed with drug abuse since her recent rehab stay.   Past Medical History:  Diagnosis Date  . Anxiety   . Barrett esophagus   . Bipolar 1 disorder [facility])    sees Dr. Candis Schatz psych [phone])  . Closed fracture distal radius and ulna, left, initial encounter 04/20/2019   S/p ORIF 04/2019 [facility])   . COPD (chronic obstructive pulmonary disease) ([place]) 03/2013   hyperinflation by CXR  . History of cocaine dependence ([place]) latest 2020   undergoing detox  . History of stomach ulcers   . Hx: UTI (urinary tract infection)   . Migraine    "stopped in ~ 2008" (03/11/2017)  . Muscle strain of gluteal region, right, subsequent encounter 10/05/2018   By MRI 2020  . Osteoporosis 06/09/2018   DEXA 05/2017 - T score -2.5 L hip, -1.3 spine  . Osteoporosis   . Positive PPD 1989   s/p CXR and INH 6 mo  . Seizure ([place]) X 1   "from takiing too many headache medicine?" (03/11/2017)  . Smoker     Past Surgical History:  Procedure Laterality Date  .  COLONOSCOPY  07/2018   poor colon prep - rec repeat (Nandigam)  . DILATION AND CURETTAGE OF UTERUS  X 1   S/P miscarriage  . ESOPHAGOGASTRODUODENOSCOPY  03/2017   candidal esophagitis, treated, LA grade D esophagitis, 4cm HH (Nandigam)  . ESOPHAGOGASTRODUODENOSCOPY  07/2018   severe reflux esophagitis, neg candida (Nandigam)  . LAPAROSCOPIC CHOLECYSTECTOMY  2006  . ORIF WRIST FRACTURE Right 04/08/2019   Procedure: OPEN REDUCTION INTERNAL FIXATION (ORIF) DISTAL RADIUS/ULNA FRACTURE;  Surgeon: Verner Mould, MD;  Location: [place];  Service: Orthopedics;  Laterality: Right;  .  TUBAL LIGATION  2003    Prior to Admission medications   Medication Sig Start Date End Date Taking? Authorizing Provider  albuterol (VENTOLIN HFA) 108 (90 Base) MCG/ACT inhaler Inhale 1-2 puffs into the lungs every 6 (six) hours as needed for wheezing or shortness of breath. 06/29/18   Recardo Evangelist, PA-C  ALPRAZolam Duanne Moron) 1 MG tablet Take 1 tablet by mouth daily as needed for anxiety.  06/30/19   [provider]  aspirin 81 MG chewable tablet Chew 1 tablet (81 mg total) by mouth daily. 07/10/19   Magnant, Charles L, PA-C  calcium carbonate (CALCIUM 600) 600 MG TABS tablet Take 1 tablet (600 mg total) by mouth daily with breakfast. 07/08/19   Ria Bush, MD  cephALEXin (KEFLEX) 500 MG capsule Take 1 capsule (500 mg total) by mouth 2 (two) times daily. 07/13/19   Ria Bush, MD  Cholecalciferol (VITAMIN D) 50 MCG (2000 UT) CAPS Take 1 capsule (2,000 Units total) by mouth daily. 07/06/19   Ria Bush, MD  dexlansoprazole (DEXILANT) 60 MG capsule Take 1 capsule (60 mg total) by mouth daily. 07/13/19   Ria Bush, MD  methocarbamol (ROBAXIN) 500 MG tablet Take 1 tablet (500 mg total) by mouth every 8 (eight) hours as needed for muscle spasms. 07/09/19   Magnant, Charles L, PA-C  ondansetron (ZOFRAN-ODT) 4 MG disintegrating tablet Take 1 tablet (4 mg total) by mouth every 8 (eight) hours as needed for nausea or vomiting. 04/12/19   Ria Bush, MD  oxyCODONE (OXY IR/ROXICODONE) 5 MG immediate release tablet 1 po q 4-6hrs prn pain 07/14/19   Meredith Pel, MD  QUEtiapine (SEROQUEL) 400 MG tablet Take 1 tablet (400 mg total) by mouth at bedtime. 07/13/19   Ria Bush, MD  sucralfate (CARAFATE) 1 GM/10ML suspension Take 10 mLs (1 g total) by mouth 4 (four) times daily. 12/19/18   Mauri Pole, MD  Tiotropium Bromide Monohydrate (SPIRIVA RESPIMAT) 2.5 MCG/ACT AERS Inhale 1 puff into the lungs daily. 07/06/19   Ria Bush, MD    No  current facility-administered medications for this encounter.    Current Outpatient Medications  Medication Sig Dispense Refill  . albuterol (VENTOLIN HFA) 108 (90 Base) MCG/ACT inhaler Inhale 1-2 puffs into the lungs every 6 (six) hours as needed for wheezing or shortness of breath. 18 g 3  . ALPRAZolam (XANAX) 1 MG tablet Take 1 tablet by mouth daily as needed for anxiety.     Marland Kitchen aspirin 81 MG chewable tablet Chew 1 tablet (81 mg total) by mouth daily. 21 tablet 0  . calcium carbonate (CALCIUM 600) 600 MG TABS tablet Take 1 tablet (600 mg total) by mouth daily with breakfast. 3 tablet   . cephALEXin (KEFLEX) 500 MG capsule Take 1 capsule (500 mg total) by mouth 2 (two) times daily. 14 capsule 0  . Cholecalciferol (VITAMIN D) 50 MCG (2000 UT) CAPS Take 1 capsule (2,000  Units total) by mouth daily.    Marland Kitchen dexlansoprazole (DEXILANT) 60 MG capsule Take 1 capsule (60 mg total) by mouth daily. 30 capsule 6  . methocarbamol (ROBAXIN) 500 MG tablet Take 1 tablet (500 mg total) by mouth every 8 (eight) hours as needed for muscle spasms. 30 tablet 0  . ondansetron (ZOFRAN-ODT) 4 MG disintegrating tablet Take 1 tablet (4 mg total) by mouth every 8 (eight) hours as needed for nausea or vomiting. 20 tablet 2  . oxyCODONE (OXY IR/ROXICODONE) 5 MG immediate release tablet 1 po q 4-6hrs prn pain 40 tablet 0  . QUEtiapine (SEROQUEL) 400 MG tablet Take 1 tablet (400 mg total) by mouth at bedtime.    . sucralfate (CARAFATE) 1 GM/10ML suspension Take 10 mLs (1 g total) by mouth 4 (four) times daily. 1260 mL 3  . Tiotropium Bromide Monohydrate (SPIRIVA RESPIMAT) 2.5 MCG/ACT AERS Inhale 1 puff into the lungs daily. 4 g 6    Allergies as of 07/18/2019 - Review Complete 07/18/2019  Allergen Reaction Noted  . Prozac [fluoxetine hcl] Swelling and Rash 12/18/2010  . Doxycycline Nausea And Vomiting 10/31/2014  . Celexa [citalopram hydrobromide] Other (See Comments) 05/06/2018  . Chantix [varenicline tartrate] Swelling  03/28/2018  . Hydroxyzine Other (See Comments) 05/06/2018  . Klonopin [clonazepam] Other (See Comments) 05/06/2018  . Lunesta [eszopiclone] Other (See Comments) 05/06/2018  . Prozac [fluoxetine hcl] Swelling 06/21/2019  . Sonata [zaleplon] Other (See Comments) 05/06/2018  . Trazodone and nefazodone Other (See Comments) 05/06/2018  . Zolpimist [zolpidem tartrate] Other (See Comments) 05/06/2018  . Azithromycin Other (See Comments) 04/03/2015    Family History  Problem Relation Age of Onset  . Diabetes Mother        T2DM  . Breast cancer Mother 59  . Dementia Mother   . Lymphoma Mother   . Coronary artery disease Father 54       MI  . Anxiety disorder Paternal Aunt   . Anxiety disorder Cousin   . Depression Cousin   . ADD / ADHD Child   . Anxiety disorder Child   . Depression Child   . Stroke Neg Hx   . Colon cancer Neg Hx   . Cancer - Colon Neg Hx     Social History   Socioeconomic History  . Marital status: Divorced    Spouse name: Not on file  . Number of children: 2  . Years of education: bachelors  . Highest education level: Not on file  Occupational History  . 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 Financial resource strain: Not on file  . Food insecurity    Worry: Not on file    Inability: Not on file  . Transportation needs    Medical: Not on file    Non-medical: Not on file  Tobacco Use  . Smoking status: Current Every Day Smoker    Packs/day: 1.00    Years: 36.00    Pack years: 36.00    Types: Cigarettes  . Smokeless tobacco: Never Used  Substance and Sexual Activity  . Alcohol use: Not Currently    Alcohol/week: 0.0 standard drinks    Comment: 03/11/2017 "stopped 09/18/2015; never an alcoholic"  . Drug use: Yes    Types: Cocaine    Comment: not currently  . Sexual activity: Never    Birth control/protection: Surgical  Lifestyle  . Physical activity  Days per week: Not on file    Minutes per session: Not on file  . Stress: Not on file  Relationships  . Social Herbalist on phone: Not on file    Gets together: Not on file    Attends religious service: Not on file    Active member of club or organization: Not on file    Attends meetings of clubs or organizations: Not on file    Relationship status: Not on file  . Intimate partner violence    Fear of current or ex partner: Not on file    Emotionally abused: Not on file    Physically abused: Not on file    Forced sexual activity: Not on file  Other Topics Concern  . Not on file  Social History Narrative   Caffeine: 40 oz diet mountain dew/day   Lives with husband and 2 children, no pets       Review of Systems: Gen: Denies any fever, chills, sweats, anorexia, fatigue, weakness, malaise, weight loss, and sleep disorder CV: Denies chest pain, angina, palpitations, syncope, orthopnea, PND, peripheral edema, and claudication. Resp: Denies dyspnea at rest, dyspnea with exercise, cough, sputum, wheezing, coughing up blood, and pleurisy. GI: Denies vomiting blood, jaundice, and fecal incontinence.   Denies dysphagia or odynophagia. GU : Denies urinary burning, blood in urine, urinary frequency, urinary hesitancy, nocturnal urination, and urinary incontinence. MS: Denies joint pain, limitation of movement, and swelling, stiffness, low back pain, extremity pain. Denies muscle weakness, cramps, atrophy.  Derm: Denies rash, itching, dry skin, hives, moles, warts, or unhealing ulcers.  Psych: Denies depression, anxiety, memory loss, suicidal ideation, hallucinations, paranoia, and confusion. Heme: Denies bruising, bleeding, and enlarged lymph nodes. Neuro:  Denies any headaches, dizziness, paresthesias. Endo:  Denies any problems with DM, thyroid, adrenal function.  Physical Exam: Vital signs in last 24 hours: Temp:  [97.7 F (36.5 C)] 97.7 F (36.5 C) (11/17 1012) Pulse  Rate:  [94-102] 100 (11/17 1200) Resp:  [14-18] 16 (11/17 1200) BP: (94-111)/(64-80) 111/80 (11/17 1200) SpO2:  [95 %-99 %] 99 % (11/17 1200) Weight:  [52.2 kg] 52.2 kg (11/17 1013)   General:   Alert,  Well-developed, well-nourished, pleasant and cooperative in NAD, easily distractible, voice loud Head:  Normocephalic and atraumatic. Eyes:  Sclera clear, no icterus.   Conjunctiva pink. Ears:  Normal auditory acuity. Nose:  No deformity, discharge,  or lesions. Mouth:  No deformity or lesions.   Neck:  Supple; no masses or thyromegaly. Lungs:  Clear throughout to auscultation.   No wheezes, crackles, or rhonchi. Heart:  Regular rate and rhythm; no murmurs, clicks, rubs,  or gallops. Abdomen:  Soft,nontender, BS active,nonpalp mass or hsm.   Rectal:  Deferred  Msk:  Symmetrical without gross deformities. . Pulses:  Normal pulses noted. Extremities: Right upper extremity in a cast and sling Neurologic:  Alert and  oriented x4;  grossly normal neurologically. Skin:  Intact without significant lesions or rashes.. Psych:  Alert and cooperative. Normal mood and affect.  Intake/Output from previous day: No intake/output data recorded. Intake/Output this shift: No intake/output data recorded.  Lab Results: Recent Labs    07/18/19 1034  WBC 12.0*  HGB 8.3*  HCT 27.5*  PLT 606*   BMET Recent Labs    07/18/19 1034  NA 133*  K 3.9  CL 99  CO2 28  GLUCOSE 90  BUN 10  CREATININE 0.42*  CALCIUM 7.8*   LFT Recent Labs  07/18/19 1034  PROT 4.9*  ALBUMIN 1.8*  AST 14*  ALT 36  ALKPHOS 273*  BILITOT 0.5   PT/INR Recent Labs    07/18/19 1034  LABPROT 13.0  INR 1.0   Hepatitis Panel No results for input(s): HEPBSAG, HCVAB, HEPAIGM, HEPBIGM in the last 72 hours.     IMPRESSION:  #29 53 year old [place] female with history of severe ulcerative esophagitis on EGD October 2019 who presents with 3 to 4-week history of dysphagia odynophagia and intermittent small-volume  hematemesis. Hemoccult negative today, however hemoglobin down 4 g total over the past 3 weeks consistent with slow blood loss.  Suspect she has recurrent severe ulcerative esophagitis, rule out peptic ulcer disease.  #2 anemia-normocytic-secondary to above #3 recent falls secondary to "unsteadiness" #4 right humeral fracture #5 bipolar disorder #6 history of polysubstance abuse, recent rehab stay #7 osteoporosis #8 colon cancer screening-colonoscopy October 2019 not prepped-was to reschedule but has not to date  Plan; soft diet today, n.p.o. after midnight Start IV Protonix every 12 hours Serial hemoglobins and transfuse for hemoglobin less than 7.5 Patient has been scheduled for upper endoscopy with Dr. Carlean Purl in a.m. tomorrow. Further recommendations pending findings at EGD. She will need to be scheduled for office follow-up with Dr. Silverio Decamp, to rediscuss colonoscopy as an outpatient.    Amy EsterwoodPA-C  07/18/2019, 1:54 PM  I have reviewed the entire case in detail with the above APP and discussed the plan in detail.  Therefore, I agree with the diagnoses recorded above. In addition,  I have personally interviewed and examined the patient.  My additional thoughts are as follows:  I found it somewhat difficult to follow her history, as she has a tangential thought pattern and an odd affect.  However, it sounds like there may have been some overt bleeding within the last couple of weeks, she was feeling weak, and then hemoglobin noted to be several grams below was a few weeks back.  She has a recent arm fracture, history of polysubstance abuse with recent stay at rehab for this, currently on medicines for bipolar disorder and sleep.  During our encounter, she was primarily fixated on concerns that she would not get the right dose of Seroquel this evening.  She has had subacute GI bleeding, not currently brisk bleeding but definitely needs upper endoscopy.  Anemia of relatively  acute blood loss in the last few weeks.  She has been off her acid suppression therapy previously described severe reflux esophagitis seems the most likely culprit at this point.  Plan is for upper endoscopy tomorrow morning with my partner Dr. Carlean Purl.  If she cannot be sufficiently sedated with fentanyl and Versed, the procedure may need to be rescheduled at a later time when anesthesia is available.  Procedure is described in detail along with risks and benefits and she was agreeable.  The benefits and risks of the planned procedure were described in detail with the patient or (when appropriate) their health care proxy.  Risks were outlined as including, but not limited to, bleeding, infection, perforation, adverse medication reaction leading to cardiac or pulmonary decompensation, pancreatitis (if ERCP).  The limitation of incomplete mucosal visualization was also discussed.  No guarantees or warranties were given.  Patient at increased risk for cardiopulmonary complications of procedure due to medical comorbidities.    Nelida Meuse III Office:[phone]
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
## 2019-07-18 NOTE — ED Provider Notes (Signed)
[facility] DEPT Provider Note   CSN: [id number] Arrival date & time: 07/18/19  [id number]     History   Chief Complaint Chief Complaint  Patient presents with  . possible GI bleed    HPI Robin Welch is a 53 y.o. female.     HPI The patient has a history of Barrett's esophagus and stomach ulcers.  Patient states she noticed she was spitting up blood a couple weeks ago but has not had any episodes since.  She is not having issues with nausea or vomiting.  No diarrhea.  No abdominal pain.  Patient however has been having some issues with weight loss and has chronic issues with her balance.  She did have a fall recently and was actually in the hospital on November 7 for treatment of a proximal humerus fracture.  Patient has been treated nonoperatively.  Patient had laboratory test recently that showed her hemoglobin dropped from 12-8.  Patient was called by her doctor and instructed to come to the hospital for further treatment.  Patient denies any hematemesis since the episode a couple weeks ago.  She has not noticed any blood in her stools.  She denies any dark black stool. Past Medical History:  Diagnosis Date  . Anxiety   . Barrett esophagus   . Bipolar 1 disorder [facility])    sees Dr. Candis Schatz psych [phone])  . Closed fracture distal radius and ulna, left, initial encounter 04/20/2019   S/p ORIF 04/2019 [facility])   . COPD (chronic obstructive pulmonary disease) ([place]) 03/2013   hyperinflation by CXR  . History of cocaine dependence ([place]) latest 2020   undergoing detox  . History of stomach ulcers   . Hx: UTI (urinary tract infection)   . Migraine    "stopped in ~ 2008" (03/11/2017)  . Muscle strain of gluteal region, right, subsequent encounter 10/05/2018   By MRI 2020  . Osteoporosis 06/09/2018   DEXA 05/2017 - T score -2.5 L hip, -1.3 spine  . Osteoporosis   . Positive PPD 1989   s/p CXR and INH 6 mo  . Seizure ([place]) X 1   "from takiing too many  headache medicine?" (03/11/2017)  . Smoker     Patient Active Problem List   Diagnosis Date Noted  . Frequent falls 07/15/2019  . Abnormal urinalysis 07/15/2019  . Closed fracture of right proximal humerus 07/09/2019  . Protein-calorie malnutrition ([place]) 07/08/2019  . Ataxia 07/08/2019  . Hypocalcemia 07/06/2019  . Pulmonary nodule less than 1 cm in diameter with moderate to high risk for malignant neoplasm 06/01/2019  . Closed fracture distal radius and ulna, right, initial encounter 04/20/2019  . Diarrhea of presumed infectious origin 01/31/2019  . GAD (generalized anxiety disorder) 09/02/2018  . Insomnia 09/02/2018  . Low serum vitamin B12 07/19/2018  . Memory loss 06/20/2018  . Osteoporosis 06/09/2018  . Chronic constipation 04/19/2018  . Gastroesophageal reflux disease 04/19/2018  . Hematemesis 04/19/2018  . Chronic hyponatremia 03/11/2017  . Iron deficiency anemia 03/11/2017  . Thrombocytosis ([place]) 03/02/2017  . Non-intractable vomiting with nausea 03/01/2017  . History of cocaine dependence ([place])   . Migraine 05/08/2015  . Hoarseness of voice 04/04/2015  . Weight loss, unintentional 03/08/2015  . COPD (chronic obstructive pulmonary disease) with chronic bronchitis ([place]) 09/22/2013  . Tobacco abuse 12/18/2010  . Bipolar 1 disorder ([place]) 12/18/2010    Past Surgical History:  Procedure Laterality Date  . COLONOSCOPY  07/2018   poor colon prep - rec repeat ([place])  .  DILATION AND CURETTAGE OF UTERUS  X 1   S/P miscarriage  . ESOPHAGOGASTRODUODENOSCOPY  03/2017   candidal esophagitis, treated, LA grade D esophagitis, 4cm HH ([place])  . ESOPHAGOGASTRODUODENOSCOPY  07/2018   severe reflux esophagitis, neg candida ([place])  . LAPAROSCOPIC CHOLECYSTECTOMY  2006  . ORIF WRIST FRACTURE Right 04/08/2019   Procedure: OPEN REDUCTION INTERNAL FIXATION (ORIF) DISTAL RADIUS/ULNA FRACTURE;  Surgeon: Verner Mould, MD;  Location: [facility];  Service: Orthopedics;   Laterality: Right;  . TUBAL LIGATION  2003     OB History   No obstetric history on file.      Home Medications    Prior to Admission medications   Medication Sig Start Date End Date Taking? Authorizing Provider  albuterol (VENTOLIN HFA) 108 (90 Base) MCG/ACT inhaler Inhale 1-2 puffs into the lungs every 6 (six) hours as needed for wheezing or shortness of breath. 06/29/18   Recardo Evangelist, PA-C  ALPRAZolam Duanne Moron) 1 MG tablet Take 1 tablet by mouth daily as needed for anxiety.  06/30/19   [provider]  aspirin 81 MG chewable tablet Chew 1 tablet (81 mg total) by mouth daily. 07/10/19   Magnant, Charles L, PA-C  calcium carbonate (CALCIUM 600) 600 MG TABS tablet Take 1 tablet (600 mg total) by mouth daily with breakfast. 07/08/19   Ria Bush, MD  cephALEXin (KEFLEX) 500 MG capsule Take 1 capsule (500 mg total) by mouth 2 (two) times daily. 07/13/19   Ria Bush, MD  Cholecalciferol (VITAMIN D) 50 MCG (2000 UT) CAPS Take 1 capsule (2,000 Units total) by mouth daily. 07/06/19   Ria Bush, MD  dexlansoprazole (DEXILANT) 60 MG capsule Take 1 capsule (60 mg total) by mouth daily. 07/13/19   Ria Bush, MD  methocarbamol (ROBAXIN) 500 MG tablet Take 1 tablet (500 mg total) by mouth every 8 (eight) hours as needed for muscle spasms. 07/09/19   Magnant, Charles L, PA-C  ondansetron (ZOFRAN-ODT) 4 MG disintegrating tablet Take 1 tablet (4 mg total) by mouth every 8 (eight) hours as needed for nausea or vomiting. 04/12/19   Ria Bush, MD  oxyCODONE (OXY IR/ROXICODONE) 5 MG immediate release tablet 1 po q 4-6hrs prn pain 07/14/19   Meredith Pel, MD  QUEtiapine (SEROQUEL) 400 MG tablet Take 1 tablet (400 mg total) by mouth at bedtime. 07/13/19   Ria Bush, MD  sucralfate (CARAFATE) 1 GM/10ML suspension Take 10 mLs (1 g total) by mouth 4 (four) times daily. 12/19/18   Mauri Pole, MD  Tiotropium Bromide Monohydrate (SPIRIVA RESPIMAT)  2.5 MCG/ACT AERS Inhale 1 puff into the lungs daily. 07/06/19   Ria Bush, MD    Family History Family History  Problem Relation Age of Onset  . Diabetes Mother        T2DM  . Breast cancer Mother 4  . Dementia Mother   . Lymphoma Mother   . Coronary artery disease Father 74       MI  . Anxiety disorder Paternal Aunt   . Anxiety disorder Cousin   . Depression Cousin   . ADD / ADHD Child   . Anxiety disorder Child   . Depression Child   . Stroke Neg Hx   . Colon cancer Neg Hx   . Cancer - Colon Neg Hx     Social History Social History   Tobacco Use  . Smoking status: Current Every Day Smoker    Packs/day: 1.00    Years: 36.00    Pack  years: 36.00    Types: Cigarettes  . Smokeless tobacco: Never Used  Substance Use Topics  . Alcohol use: Not Currently    Alcohol/week: 0.0 standard drinks    Comment: 03/11/2017 "stopped 09/18/2015; never an alcoholic"  . Drug use: Yes    Types: Cocaine    Comment: not currently     Allergies   Prozac [fluoxetine hcl], Doxycycline, Celexa [citalopram hydrobromide], Chantix [varenicline tartrate], Hydroxyzine, Klonopin [clonazepam], Lunesta [eszopiclone], Prozac [fluoxetine hcl], Sonata [zaleplon], Trazodone and nefazodone, Zolpimist [zolpidem tartrate], and Azithromycin   Review of Systems Review of Systems  All other systems reviewed and are negative.    Physical Exam Updated Vital Signs BP 111/80   Pulse 100   Temp 97.7 F (36.5 C) (Oral)   Resp 16   Ht 1.575 m (5\' 2" )   Wt 52.2 kg   LMP 04/23/2015 (Approximate)   SpO2 99%   BMI 21.03 kg/m   Physical Exam Vitals signs and nursing note reviewed.  Constitutional:      General: She is not in acute distress.    Appearance: She is well-developed.  HENT:     Head: Normocephalic and atraumatic.     Right Ear: External ear normal.     Left Ear: External ear normal.  Eyes:     General: No scleral icterus.       Right eye: No discharge.        Left eye: No  discharge.     Conjunctiva/sclera: Conjunctivae normal.  Neck:     Musculoskeletal: Neck supple.     Trachea: No tracheal deviation.  Cardiovascular:     Rate and Rhythm: Normal rate and regular rhythm.  Pulmonary:     Effort: Pulmonary effort is normal. No respiratory distress.     Breath sounds: Normal breath sounds. No stridor. No wheezing or rales.  Abdominal:     General: Bowel sounds are normal. There is no distension.     Palpations: Abdomen is soft.     Tenderness: There is no abdominal tenderness. There is no guarding or rebound.  Genitourinary:    Comments: No stool in the rectal vault, no blood Musculoskeletal:        General: No tenderness.  Skin:    General: Skin is warm and dry.     Findings: No rash.  Neurological:     Mental Status: She is alert.     Cranial Nerves: No cranial nerve deficit (no facial droop, extraocular movements intact, no slurred speech).     Sensory: No sensory deficit.     Motor: No abnormal muscle tone or seizure activity.     Coordination: Coordination normal.      ED Treatments / Results  Labs (all labs ordered are listed, but only abnormal results are displayed) Labs Reviewed  COMPREHENSIVE METABOLIC PANEL - Abnormal; Notable for the following components:      Result Value   Sodium 133 (*)    Creatinine, Ser 0.42 (*)    Calcium 7.8 (*)    Total Protein 4.9 (*)    Albumin 1.8 (*)    AST 14 (*)    Alkaline Phosphatase 273 (*)    All other components within normal limits  CBC - Abnormal; Notable for the following components:   WBC 12.0 (*)    RBC 2.86 (*)    Hemoglobin 8.3 (*)    HCT 27.5 (*)    RDW 16.7 (*)    Platelets 606 (*)    All other  components within normal limits  SARS CORONAVIRUS 2 (TAT 6-24 HRS)  PROTIME-INR  POC OCCULT BLOOD, ED  TYPE AND SCREEN  ABO/RH    EKG None  Radiology US Abdomen Complete  Result Date: 07/18/2019 CLINICAL DATA:  Weight loss and elevated liver enzymes EXAM: ABDOMEN ULTRASOUND  COMPLETE COMPARISON:  March 05, 2017 FINDINGS: Gallbladder: Surgically absent. Common bile duct: Diameter: 4 mm. No intrahepatic, common hepatic, or common bile duct dilatation. Liver: No focal lesion identified. Within normal limits in parenchymal echogenicity. Portal vein is patent on color Doppler imaging with normal direction of blood flow towards the liver. IVC: No abnormality visualized. Pancreas: No pancreatic mass or inflammatory focus. Spleen: Size and appearance within normal limits. Right Kidney: Length: 11.8 cm. Echogenicity within normal limits. No mass or hydronephrosis visualized. Left Kidney: Length: 11.2 cm. Echogenicity within normal limits. No mass or hydronephrosis visualized. Abdominal aorta: No aneurysm visualized. Other findings: No demonstrable ascites. IMPRESSION: Gallbladder absent.  Study otherwise unremarkable. Electronically Signed   By: Lowella Grip III M.D.   On: 07/18/2019 13:13    Procedures Procedures (including critical care time)  Medications Ordered in ED Medications  sodium chloride 0.9 % bolus 1,000 mL (1,000 mLs Intravenous New Bag/Given 07/18/19 1051)  oxyCODONE (Oxy IR/ROXICODONE) immediate release tablet 5 mg (5 mg Oral Given 07/18/19 1316)     Initial Impression / Assessment and Plan / ED Course  I have reviewed the triage vital signs and the nursing notes.  Pertinent labs & imaging results that were available during my care of the patient were reviewed by me and considered in my medical decision making (see chart for details).  Clinical Course as of Jul 17 1341  Tue Jul 18, 2019  1108 Labs reviewed.  Patient does have a hemoglobin of 8.3.  10 days ago was 8.8 and 5 days ago was 8.7   [JK]  1109 3 weeks ago the hemoglobin was 12.   [JK]  1241 Electrolyte panel is unremarkable.  Stool is negative for blood.  I will consult with gastroenterology   [id number] Patient requesting medications for her shoulder pain   [JK]  1340 Discussed with PA  [place].  Will admit for plan on endoscopy tomorrow   [JK]    Clinical Course User Index [JK] Dorie Rank, MD    Patient presents to the ED for evaluation of worsening anemia.  Patient shows a significant trend down in her hemoglobin since a couple of weeks ago.  Over the last several days however it has remained relatively stable.  No signs of active GI bleeding but the patient has had problems with Barrett's esophagus and GI bleed in the past.  I discussed the case with one of our GI.  Plan is for admission in anticipation of endoscopy tomorrow.  Final Clinical Impressions(s) / ED Diagnoses   Final diagnoses:  Anemia, unspecified type    ED Discharge Orders    None       Dorie Rank, MD 07/18/19 1343

## 2019-07-18 NOTE — H&P (Signed)
History and Physical    Robin Welch [id number] DOB: [birth year] DOA: 07/18/2019  PCP: Ria Bush, MD   Patient coming from: home  I have personally briefly reviewed patient's old medical records in [place]  Chief Complaint: Anemic/Hematemesis  HPI: Robin Welch is a 53 y.o. female with pmhx of Barrett's Esophagus, PUD, Bipolar Disorder 1, Anxiety, COPD, Tobacco abuse (current 2 ppd), and Osteoporosis who presents after recent bouts of hematemesis.  Patient reports she has been having ongoing hematemesis which actually stopped 2 weeks prior.  She states for the past month after recent traumatic fracture of her RUE she has been using NSAIDs daily - both ibuprofen and naproxen for pain control, while also taking her baby aspirin.  She states up until two weeks ago she routinely would vomit up small amount of blood but occasionally large amounts of bright red blood as well.  She states at that time she was started of carafate and noticed marked improvement and then she began dexilant this past Thursday.  She has not had any abdominal pain, melena, or recent hematemesis.  She states she has been eating and drinking well without issue.  She denies any fevers, chills, cough, sob, chest pain.  Normal bowel habits.  She does smoke - still 2 ppds, no alcohol or drug use.  She lives at home with her two sons.  She ambulates without any DME.   Review of Systems: As per HPI otherwise 10 point review of systems negative.    Past Medical History:  Diagnosis Date  . Anxiety   . Barrett esophagus   . Bipolar 1 disorder [facility])    sees Dr. Candis Schatz psych [phone])  . Closed fracture distal radius and ulna, left, initial encounter 04/20/2019   S/p ORIF 04/2019 [facility])   . COPD (chronic obstructive pulmonary disease) ([place]) 03/2013   hyperinflation by CXR  . History of cocaine dependence ([place]) latest 2020   undergoing detox  . History of stomach ulcers   . Hx: UTI (urinary tract  infection)   . Migraine    "stopped in ~ 2008" (03/11/2017)  . Muscle strain of gluteal region, right, subsequent encounter 10/05/2018   By MRI 2020  . Osteoporosis 06/09/2018   DEXA 05/2017 - T score -2.5 L hip, -1.3 spine  . Osteoporosis   . Positive PPD 1989   s/p CXR and INH 6 mo  . Seizure ([place]) X 1   "from takiing too many headache medicine?" (03/11/2017)  . Smoker     Past Surgical History:  Procedure Laterality Date  . COLONOSCOPY  07/2018   poor colon prep - rec repeat ([place])  . DILATION AND CURETTAGE OF UTERUS  X 1   S/P miscarriage  . ESOPHAGOGASTRODUODENOSCOPY  03/2017   candidal esophagitis, treated, LA grade D esophagitis, 4cm HH ([facility])  . ESOPHAGOGASTRODUODENOSCOPY  07/2018   severe reflux esophagitis, neg candida ([facility])  . LAPAROSCOPIC CHOLECYSTECTOMY  2006  . ORIF WRIST FRACTURE Right 04/08/2019   Procedure: OPEN REDUCTION INTERNAL FIXATION (ORIF) DISTAL RADIUS/ULNA FRACTURE;  Surgeon: Verner Mould, MD;  Location: [facility];  Service: Orthopedics;  Laterality: Right;  . TUBAL LIGATION  2003     reports that she has been smoking cigarettes. She has a 36.00 pack-year smoking history. She has never used smokeless tobacco. She reports previous alcohol use. She reports current drug use. Drug: Cocaine.  Allergies  Allergen Reactions  . Prozac [Fluoxetine Hcl] Swelling and Rash  . Doxycycline Nausea And Vomiting  .  Celexa [Citalopram Hydrobromide] Other (See Comments)    Possible allergic reaction  . Chantix [Varenicline Tartrate] Swelling    Mouth swelling  . Hydroxyzine Other (See Comments)    Not effective  . Klonopin [Clonazepam] Other (See Comments)    Caused instablility. Not able to drive due to over medicated  . Lunesta [Eszopiclone] Other (See Comments)    Not effective  . Prozac [Fluoxetine Hcl] Swelling  . Sonata [Zaleplon] Other (See Comments)    Not effective  . Trazodone And Nefazodone Other (See Comments)    ineffective  .  Zolpimist [Zolpidem Tartrate] Other (See Comments)    Not effective  . Azithromycin Other (See Comments)    Reaction not recalled by the patient    Family History  Problem Relation Age of Onset  . Diabetes Mother        T2DM  . Breast cancer Mother 38  . Dementia Mother   . Lymphoma Mother   . Coronary artery disease Father 40       MI  . Anxiety disorder Paternal Aunt   . Anxiety disorder Cousin   . Depression Cousin   . ADD / ADHD Child   . Anxiety disorder Child   . Depression Child   . Stroke Neg Hx   . Colon cancer Neg Hx   . Cancer - Colon Neg Hx     Prior to Admission medications   Medication Sig Start Date End Date Taking? Authorizing Provider  albuterol (VENTOLIN HFA) 108 (90 Base) MCG/ACT inhaler Inhale 1-2 puffs into the lungs every 6 (six) hours as needed for wheezing or shortness of breath. 06/29/18   Recardo Evangelist, PA-C  ALPRAZolam Duanne Moron) 1 MG tablet Take 1 tablet by mouth daily as needed for anxiety.  06/30/19   [provider]  aspirin 81 MG chewable tablet Chew 1 tablet (81 mg total) by mouth daily. 07/10/19   Magnant, Charles L, PA-C  calcium carbonate (CALCIUM 600) 600 MG TABS tablet Take 1 tablet (600 mg total) by mouth daily with breakfast. 07/08/19   Ria Bush, MD  cephALEXin (KEFLEX) 500 MG capsule Take 1 capsule (500 mg total) by mouth 2 (two) times daily. 07/13/19   Ria Bush, MD  Cholecalciferol (VITAMIN D) 50 MCG (2000 UT) CAPS Take 1 capsule (2,000 Units total) by mouth daily. 07/06/19   Ria Bush, MD  dexlansoprazole (DEXILANT) 60 MG capsule Take 1 capsule (60 mg total) by mouth daily. 07/13/19   Ria Bush, MD  methocarbamol (ROBAXIN) 500 MG tablet Take 1 tablet (500 mg total) by mouth every 8 (eight) hours as needed for muscle spasms. 07/09/19   Magnant, Charles L, PA-C  ondansetron (ZOFRAN-ODT) 4 MG disintegrating tablet Take 1 tablet (4 mg total) by mouth every 8 (eight) hours as needed for nausea or  vomiting. 04/12/19   Ria Bush, MD  oxyCODONE (OXY IR/ROXICODONE) 5 MG immediate release tablet 1 po q 4-6hrs prn pain Patient taking differently: Take 5 mg by mouth every 6 (six) hours as needed for moderate pain.  07/14/19   Meredith Pel, MD  QUEtiapine (SEROQUEL) 400 MG tablet Take 1 tablet (400 mg total) by mouth at bedtime. 07/13/19   Ria Bush, MD  sucralfate (CARAFATE) 1 GM/10ML suspension Take 10 mLs (1 g total) by mouth 4 (four) times daily. 12/19/18   Mauri Pole, MD  Tiotropium Bromide Monohydrate (SPIRIVA RESPIMAT) 2.5 MCG/ACT AERS Inhale 1 puff into the lungs daily. 07/06/19   Ria Bush, MD  Physical Exam: Vitals:   07/18/19 1130 07/18/19 1200 07/18/19 1330 07/18/19 1449  BP: 104/75 111/80 114/76 113/76  Pulse: 96 100 95 96  Resp:  16 18 15   Temp:      TempSrc:      SpO2: 99% 99% 100% 100%  Weight:      Height:        Constitutional: NAD, calm, comfortable Vitals:   07/18/19 1130 07/18/19 1200 07/18/19 1330 07/18/19 1449  BP: 104/75 111/80 114/76 113/76  Pulse: 96 100 95 96  Resp:  16 18 15   Temp:      TempSrc:      SpO2: 99% 99% 100% 100%  Weight:      Height:       General: pleasant, NAD Eyes: PERRL, lids and conjunctivae normal, EOMI ENMT: Mucous membranes are moist. Posterior pharynx clear of any exudate or lesions.Normal dentition.  Neck: normal, supple, no masses, no thyromegaly Respiratory: clear to auscultation bilaterally, no wheezing, no crackles. Normal respiratory effort. No accessory muscle use.  Cardiovascular: Regular rate and rhythm, no murmurs / rubs / gallops. No extremity edema. 2+ pedal pulses.  Abdomen: no tenderness, no masses palpated. No hepatosplenomegaly. Bowel sounds positive.  Musculoskeletal: RUE in sling, otherwise LUE and BL LE ROM intact, normal tone Skin: no rashes, lesions, ulcers. No induration Neurologic: CN 2-12 grossly intact. Sensation and strength otherwise grossly intact Psychiatric:  Normal judgment and insight. Alert and oriented x 3. Normal mood.    Labs on Admission: I have personally reviewed following labs and imaging studies  CBC: Recent Labs  Lab 07/13/19 1236 07/18/19 1034  WBC 9.8 12.0*  NEUTROABS 6.4  --   HGB 8.7 Repeated and verified X2.* 8.3*  HCT 26.4 Repeated and verified X2.* 27.5*  MCV 90.2 96.2  PLT 594.0* [id number]*   Basic Metabolic Panel: Recent Labs  Lab 07/13/19 1236 07/18/19 1034  NA 132* 133*  K 4.4 3.9  CL 102 99  CO2 25 28  GLUCOSE 87 90  BUN 8 10  CREATININE 0.39* 0.42*  CALCIUM 7.5* 7.8*   GFR: Estimated Creatinine Clearance: 62.8 mL/min (A) (by C-G formula based on SCr of 0.42 mg/dL (L)). Liver Function Tests: Recent Labs  Lab 07/13/19 1236 07/18/19 1034  AST 25 14*  ALT 114* 36  ALKPHOS 405* 273*  BILITOT 0.5 0.5  PROT 4.6* 4.9*  ALBUMIN 2.0* 1.8*   No results for input(s): LIPASE, AMYLASE in the last 168 hours. No results for input(s): AMMONIA in the last 168 hours. Coagulation Profile: Recent Labs  Lab 07/18/19 1034  INR 1.0   Cardiac Enzymes: No results for input(s): CKTOTAL, CKMB, CKMBINDEX, TROPONINI in the last 168 hours. BNP (last 3 results) No results for input(s): PROBNP in the last 8760 hours. HbA1C: No results for input(s): HGBA1C in the last 72 hours. CBG: No results for input(s): GLUCAP in the last 168 hours. Lipid Profile: No results for input(s): CHOL, HDL, LDLCALC, TRIG, CHOLHDL, LDLDIRECT in the last 72 hours. Thyroid Function Tests: No results for input(s): TSH, T4TOTAL, FREET4, T3FREE, THYROIDAB in the last 72 hours. Anemia Panel: No results for input(s): VITAMINB12, FOLATE, FERRITIN, TIBC, IRON, RETICCTPCT in the last 72 hours. Urine analysis:    Component Value Date/Time   COLORURINE YELLOW 06/21/2019 [place] 06/21/2019 1555   LABSPEC 1.002 (L) 06/21/2019 1555   PHURINE 6.0 06/21/2019 [place] 06/21/2019 1555   HGBUR NEGATIVE 06/21/2019 1555    BILIRUBINUR negative 07/13/2019 1219  KETONESUR NEGATIVE 06/21/2019 1555   PROTEINUR Negative 07/13/2019 1219   PROTEINUR NEGATIVE 06/21/2019 1555   UROBILINOGEN 0.2 07/13/2019 1219   UROBILINOGEN 0.2 12/27/2010 1113   NITRITE negative 07/13/2019 1219   NITRITE NEGATIVE 06/21/2019 1555   LEUKOCYTESUR Large (3+) (A) 07/13/2019 1219   LEUKOCYTESUR NEGATIVE 06/21/2019 1555    Radiological Exams on Admission: US Abdomen Complete  Result Date: 07/18/2019 CLINICAL DATA:  Weight loss and elevated liver enzymes EXAM: ABDOMEN ULTRASOUND COMPLETE COMPARISON:  March 05, 2017 FINDINGS: Gallbladder: Surgically absent. Common bile duct: Diameter: 4 mm. No intrahepatic, common hepatic, or common bile duct dilatation. Liver: No focal lesion identified. Within normal limits in parenchymal echogenicity. Portal vein is patent on color Doppler imaging with normal direction of blood flow towards the liver. IVC: No abnormality visualized. Pancreas: No pancreatic mass or inflammatory focus. Spleen: Size and appearance within normal limits. Right Kidney: Length: 11.8 cm. Echogenicity within normal limits. No mass or hydronephrosis visualized. Left Kidney: Length: 11.2 cm. Echogenicity within normal limits. No mass or hydronephrosis visualized. Abdominal aorta: No aneurysm visualized. Other findings: No demonstrable ascites. IMPRESSION: Gallbladder absent.  Study otherwise unremarkable. Electronically Signed   By: Lowella Grip III M.D.   On: 07/18/2019 13:13      Assessment/Plan Viktorya Leifheit is a 53 y.o. female with pmhx of Barrett's Esophagus, PUD, Bipolar Disorder 1, Anxiety, COPD, Tobacco abuse (current 2 ppd), and Osteoporosis who presents after recent bouts of hematemesis and new blood loss anemia.  # UGI bleed # Acute blood loss anemia - patient with hx of erosive and ulcerative esophagitis, PUD, current NSAID use along with baby Aspirin use daily who presents with recent hematemesis and anemia from  recent baseline of 12 now down to 8.3 - stop aspirin, NSAIDs and counseled on concerns of these agents and bleeding risk - continue IV PPI BID - continue carafate - [facility] consulted, appreciate care and recommendations - NPO midnight for EGD in AM - CLD for now - patient is consented for blood and transfuse to maintain > 7, trend H/H  # Recent traumatic fracture or R. Humerus - pain control with oxycodone per home, stop NSAIDs, PT/OT  # Recent UTI - s/p cephalexin  # Bipolar Disorder - continue HS Quetiapine  # COPD - continue home inhalers  # Tobacco abuse - 2 ppd every day smoker - counseled   DVT prophylaxis: SCDs Code Status:Full Family Communication: Patient to notify her sons Disposition Plan: pending Consults called: Gastroenterology, Velora Heckler - PA-C Amy Esterwood, Physician to staff Admission status: obs  Truddie Hidden MD [facility] Pager [phone]  If 7PM-7AM, please contact night-coverage [email or web address] Password [email or web address]  07/18/2019, 2:55 PM

## 2019-07-19 ENCOUNTER — Telehealth: Payer: Self-pay | Admitting: Family Medicine

## 2019-07-19 ENCOUNTER — Telehealth: Payer: Self-pay | Admitting: *Deleted

## 2019-07-19 ENCOUNTER — Encounter (HOSPITAL_COMMUNITY)
Admission: EM | Disposition: A | Discharge status: Home Health | Payer: Self-pay | Source: Home / Self Care | Attending: Internal Medicine

## 2019-07-19 DIAGNOSIS — N390 Urinary tract infection, site not specified: Secondary | ICD-10-CM | POA: Diagnosis present

## 2019-07-19 DIAGNOSIS — K222 Esophageal obstruction: Principal | ICD-10-CM

## 2019-07-19 DIAGNOSIS — J449 Chronic obstructive pulmonary disease, unspecified: Secondary | ICD-10-CM | POA: Diagnosis present

## 2019-07-19 DIAGNOSIS — Z8719 Personal history of other diseases of the digestive system: Secondary | ICD-10-CM | POA: Diagnosis not present

## 2019-07-19 DIAGNOSIS — K922 Gastrointestinal hemorrhage, unspecified: Secondary | ICD-10-CM | POA: Diagnosis not present

## 2019-07-19 DIAGNOSIS — Z79891 Long term (current) use of opiate analgesic: Secondary | ICD-10-CM | POA: Diagnosis not present

## 2019-07-19 DIAGNOSIS — K920 Hematemesis: Secondary | ICD-10-CM

## 2019-07-19 DIAGNOSIS — Z881 Allergy status to other antibiotic agents status: Secondary | ICD-10-CM | POA: Diagnosis not present

## 2019-07-19 DIAGNOSIS — K2101 Gastro-esophageal reflux disease with esophagitis, with bleeding: Secondary | ICD-10-CM

## 2019-07-19 DIAGNOSIS — D649 Anemia, unspecified: Secondary | ICD-10-CM | POA: Diagnosis present

## 2019-07-19 DIAGNOSIS — F419 Anxiety disorder, unspecified: Secondary | ICD-10-CM | POA: Diagnosis present

## 2019-07-19 DIAGNOSIS — M810 Age-related osteoporosis without current pathological fracture: Secondary | ICD-10-CM | POA: Diagnosis present

## 2019-07-19 DIAGNOSIS — Z79899 Other long term (current) drug therapy: Secondary | ICD-10-CM | POA: Diagnosis not present

## 2019-07-19 DIAGNOSIS — D62 Acute posthemorrhagic anemia: Secondary | ICD-10-CM | POA: Diagnosis present

## 2019-07-19 DIAGNOSIS — R748 Abnormal levels of other serum enzymes: Secondary | ICD-10-CM | POA: Diagnosis present

## 2019-07-19 DIAGNOSIS — K2210 Ulcer of esophagus without bleeding: Secondary | ICD-10-CM | POA: Diagnosis not present

## 2019-07-19 DIAGNOSIS — Z8711 Personal history of peptic ulcer disease: Secondary | ICD-10-CM | POA: Diagnosis not present

## 2019-07-19 DIAGNOSIS — Z833 Family history of diabetes mellitus: Secondary | ICD-10-CM | POA: Diagnosis not present

## 2019-07-19 DIAGNOSIS — W19XXXD Unspecified fall, subsequent encounter: Secondary | ICD-10-CM | POA: Diagnosis present

## 2019-07-19 DIAGNOSIS — Z20828 Contact with and (suspected) exposure to other viral communicable diseases: Secondary | ICD-10-CM | POA: Diagnosis present

## 2019-07-19 DIAGNOSIS — Z888 Allergy status to other drugs, medicaments and biological substances status: Secondary | ICD-10-CM | POA: Diagnosis not present

## 2019-07-19 DIAGNOSIS — Z885 Allergy status to narcotic agent status: Secondary | ICD-10-CM | POA: Diagnosis not present

## 2019-07-19 DIAGNOSIS — F319 Bipolar disorder, unspecified: Secondary | ICD-10-CM | POA: Diagnosis present

## 2019-07-19 DIAGNOSIS — Z818 Family history of other mental and behavioral disorders: Secondary | ICD-10-CM | POA: Diagnosis not present

## 2019-07-19 DIAGNOSIS — Z8249 Family history of ischemic heart disease and other diseases of the circulatory system: Secondary | ICD-10-CM | POA: Diagnosis not present

## 2019-07-19 DIAGNOSIS — Z807 Family history of other malignant neoplasms of lymphoid, hematopoietic and related tissues: Secondary | ICD-10-CM | POA: Diagnosis not present

## 2019-07-19 DIAGNOSIS — Z803 Family history of malignant neoplasm of breast: Secondary | ICD-10-CM | POA: Diagnosis not present

## 2019-07-19 DIAGNOSIS — Z7982 Long term (current) use of aspirin: Secondary | ICD-10-CM | POA: Diagnosis not present

## 2019-07-19 DIAGNOSIS — F17210 Nicotine dependence, cigarettes, uncomplicated: Secondary | ICD-10-CM | POA: Diagnosis present

## 2019-07-19 HISTORY — PX: ESOPHAGOGASTRODUODENOSCOPY (EGD) WITH PROPOFOL: SHX5813

## 2019-07-19 HISTORY — PX: BIOPSY: SHX5522

## 2019-07-19 LAB — HEMOGLOBIN AND HEMATOCRIT, BLOOD
HCT: 24.8 % — ABNORMAL LOW (ref 36.0–46.0)
HCT: 24.6 % — ABNORMAL LOW (ref 36.0–46.0)
HCT: 25.1 % — ABNORMAL LOW (ref 36.0–46.0)
Hemoglobin: 7.5 g/dL — ABNORMAL LOW (ref 12.0–15.0)
Hemoglobin: 7.5 g/dL — ABNORMAL LOW (ref 12.0–15.0)
Hemoglobin: 7.6 g/dL — ABNORMAL LOW (ref 12.0–15.0)

## 2019-07-19 LAB — COMPREHENSIVE METABOLIC PANEL
ALT: 27 U/L (ref 0–44)
AST: 15 U/L (ref 15–41)
Albumin: 1.6 g/dL — ABNORMAL LOW (ref 3.5–5.0)
Alkaline Phosphatase: 225 U/L — ABNORMAL HIGH (ref 38–126)
Anion gap: 5 (ref 5–15)
BUN: 5 mg/dL — ABNORMAL LOW (ref 6–20)
CO2: 22 mmol/L (ref 22–32)
Calcium: 7.2 mg/dL — ABNORMAL LOW (ref 8.9–10.3)
Chloride: 107 mmol/L (ref 98–111)
Creatinine, Ser: 0.42 mg/dL — ABNORMAL LOW (ref 0.44–1.00)
GFR calc Af Amer: >60 mL/min (ref >60)
GFR calc non Af Amer: >60 mL/min (ref >60)
Glucose, Bld: 94 mg/dL (ref 70–99)
Potassium: 3.7 mmol/L (ref 3.5–5.1)
Sodium: 134 mmol/L — ABNORMAL LOW (ref 135–145)
Total Bilirubin: 0.8 mg/dL (ref 0.3–1.2)
Total Protein: 4.4 g/dL — ABNORMAL LOW (ref 6.5–8.1)

## 2019-07-19 LAB — CBC
HCT: 25 % — ABNORMAL LOW (ref 36.0–46.0)
HCT: 28.4 % — ABNORMAL LOW (ref 36.0–46.0)
Hemoglobin: 7.7 g/dL — ABNORMAL LOW (ref 12.0–15.0)
Hemoglobin: 8.7 g/dL — ABNORMAL LOW (ref 12.0–15.0)
MCH: 29.6 pg (ref 26.0–34.0)
MCH: 29.6 pg (ref 26.0–34.0)
MCHC: 30.8 g/dL (ref 30.0–36.0)
MCHC: 30.6 g/dL (ref 30.0–36.0)
MCV: 96.2 fL (ref 80.0–100.0)
MCV: 96.6 fL (ref 80.0–100.0)
Platelets: 503 10*3/uL — ABNORMAL HIGH (ref 150–400)
Platelets: 722 10*3/uL — ABNORMAL HIGH (ref 150–400)
RBC: 2.6 MIL/uL — ABNORMAL LOW (ref 3.87–5.11)
RBC: 2.94 MIL/uL — ABNORMAL LOW (ref 3.87–5.11)
RDW: 16.7 % — ABNORMAL HIGH (ref 11.5–15.5)
RDW: 16.8 % — ABNORMAL HIGH (ref 11.5–15.5)
WBC: 8.1 10*3/uL (ref 4.0–10.5)
WBC: 10.2 10*3/uL (ref 4.0–10.5)
nRBC: 0 % (ref 0.0–0.2)
nRBC: 0 % (ref 0.0–0.2)

## 2019-07-19 LAB — VITAMIN B12: Vitamin B-12: 440 pg/mL (ref 180–914)

## 2019-07-19 LAB — PROTIME-INR
INR: 1.2 (ref 0.8–1.2)
Prothrombin Time: 14.6 seconds (ref 11.4–15.2)

## 2019-07-19 LAB — FERRITIN: Ferritin: 47 ng/mL (ref 11–307)

## 2019-07-19 SURGERY — ESOPHAGOGASTRODUODENOSCOPY (EGD) WITH PROPOFOL
Anesthesia: Moderate Sedation

## 2019-07-19 MED ORDER — BUTAMBEN-TETRACAINE-BENZOCAINE 2-2-14 % EX AERO
INHALATION_SPRAY | CUTANEOUS | Status: DC | PRN
  Administered 2019-07-19: 2 via TOPICAL

## 2019-07-19 MED ORDER — SODIUM CHLORIDE 0.9 % IV SOLN
INTRAVENOUS | Status: AC | PRN
  Administered 2019-07-19: 500 mL via INTRAMUSCULAR

## 2019-07-19 MED ORDER — MIDAZOLAM HCL 5 MG/5ML IJ SOLN
INTRAMUSCULAR | Status: DC | PRN
  Administered 2019-07-19 (×2): 2 mg via INTRAVENOUS
  Administered 2019-07-19: 1 mg via INTRAVENOUS

## 2019-07-19 MED ORDER — FENTANYL CITRATE (PF) 100 MCG/2ML IJ SOLN
INTRAMUSCULAR | Status: AC
  Filled 2019-07-19: qty 2

## 2019-07-19 MED ORDER — SODIUM CHLORIDE 0.9 % IV SOLN
INTRAVENOUS | Status: DC
  Administered 2019-07-19 – 2019-07-20 (×3): via INTRAVENOUS

## 2019-07-19 MED ORDER — DIPHENHYDRAMINE HCL 50 MG/ML IJ SOLN
INTRAMUSCULAR | Status: AC
  Filled 2019-07-19: qty 1

## 2019-07-19 MED ORDER — NICOTINE 21 MG/24HR TD PT24
21.0000 mg | MEDICATED_PATCH | Freq: Every day | TRANSDERMAL | Status: DC
  Administered 2019-07-19 – 2019-07-20 (×2): 21 mg via TRANSDERMAL
  Filled 2019-07-19 (×2): qty 1

## 2019-07-19 MED ORDER — FENTANYL CITRATE (PF) 100 MCG/2ML IJ SOLN
INTRAMUSCULAR | Status: DC | PRN
  Administered 2019-07-19: 25 ug via INTRAVENOUS

## 2019-07-19 MED ORDER — SODIUM CHLORIDE 0.9 % IV SOLN
510.0000 mg | Freq: Once | INTRAVENOUS | Status: AC
  Administered 2019-07-19: 12:00:00 510 mg via INTRAVENOUS
  Filled 2019-07-19: qty 17

## 2019-07-19 MED ORDER — DIPHENHYDRAMINE HCL 50 MG/ML IJ SOLN
INTRAMUSCULAR | Status: DC | PRN
  Administered 2019-07-19: 25 mg via INTRAVENOUS

## 2019-07-19 MED ORDER — MIDAZOLAM HCL (PF) 5 MG/ML IJ SOLN
INTRAMUSCULAR | Status: AC
  Filled 2019-07-19: qty 2

## 2019-07-19 SURGICAL SUPPLY — 14 items

## 2019-07-19 NOTE — Telephone Encounter (Signed)
Pt aware of US results. 

## 2019-07-19 NOTE — Progress Notes (Signed)
PROGRESS NOTE    Robin Welch  [id number] DOB: 08-Sep-1965 DOA: 07/18/2019 PCP: Ria Bush, MD    Brief Narrative: Robin Welch is a 53 y.o. female with pmhx of Barrett's Esophagus, PUD, Bipolar Disorder 1, Anxiety, COPD, Tobacco abuse (current 2 ppd), and Osteoporosis who presents after recent bouts of hematemesis.  Patient reports she has been having ongoing hematemesis which actually stopped 2 weeks prior.  She states for the past month after recent traumatic fracture of her RUE she has been using NSAIDs daily - both ibuprofen and naproxen for pain control, while also taking her baby aspirin.  She states up until two weeks ago she routinely would vomit up small amount of blood but occasionally large amounts of bright red blood as well.  She states at that time she was started of carafate and noticed marked improvement and then she began dexilant this past Thursday.  She has not had any abdominal pain, melena, or recent hematemesis.  She states she has been eating and drinking well without issue.  She denies any fevers, chills, cough, sob, chest pain.  Normal bowel habits.  She does smoke - still 2 ppds, no alcohol or drug use.  She lives at home with her two sons.  She ambulates without any DME.   Assessment & Plan:   Active Problems:   Upper GI bleed   Ulcerative esophagitis   Esophageal stricture   # UGI bleed/ABLA-patient has a history of erosive and ulcerative esophagitis with NSAID and aspirin use.  She was admitted with recurrent hematemesis and anemia.  He did she does have history of peptic ulcer disease.  EGD 07/19/2019 showed --grade D reflux esophagitis with bleeding biopsied, terrible ulcerative esophagitis mid distal esophagus, esophageal stenosis and stricture, normal stomach medium sized hiatal hernia normal examined duodenum. GI recommends clear liquid diet, continue Protonix twice daily with Carafate, Feraheme ordered.  Trend H&H transfuse to keep her  hemoglobin above 7. Gastrin level ordered PUREED Diet tomorrow   # Recent traumatic fracture or R. Humerus-pain controlled with oxycodone.  PT OT pending.  # Recent UTI - s/p cephalexin  # Bipolar Disorder - continue HS Quetiapine  # COPD-not wheezing continue home inhalers.  # Tobacco abuse-patient smokes 2 pack/day.  Counseled regarding the risk of ongoing smoking.  #Elevated alkaline phosphatase follow-up levels tomorrow unclear etiology    Estimated body mass index is 19.8 kg/m as calculated from the following:   Height as of this encounter: 5\' 2"  (1.575 m).   Weight as of this encounter: 49.1 kg.   Subjective: Resting in bed having clear liquids after EGD wants to go home  Objective: Vitals:   07/19/19 0900 07/19/19 0905 07/19/19 0915 07/19/19 0920  BP: 96/65 119/61 111/69 113/66  Pulse: (!) 104 (!) 102 98 98  Resp: 14 17 15 15   Temp:  98.1 F (36.7 C)    TempSrc:  Oral    SpO2: 98% 99% 98% 99%  Weight:      Height:        Intake/Output Summary (Last 24 hours) at 07/19/2019 1138 Last data filed at 07/19/2019 0500 Gross per 24 hour  Intake 2026.58 ml  Output -  Net 2026.58 ml   Filed Weights   07/18/19 1013 07/19/19 0548 07/19/19 0804  Weight: 52.2 kg 49.1 kg 49.1 kg    Examination:  General exam: Appears calm and comfortable  Respiratory system: Clear to auscultation. Respiratory effort normal. Cardiovascular system: S1 & S2 heard, RRR. No JVD, murmurs, rubs,  gallops or clicks. No pedal edema. Gastrointestinal system: Abdomen is nondistended, soft and nontender. No organomegaly or masses felt. Normal bowel sounds heard. Central nervous system: Alert and oriented. No focal neurological deficits. Extremities: Symmetric 5 x 5 power. Skin: No rashes, lesions or ulcers Psychiatry: Judgement and insight appear normal. Mood & affect appropriate.     Data Reviewed: I have personally reviewed following labs and imaging studies  CBC: Recent Labs   Lab 07/13/19 1236 07/18/19 1034 07/18/19 2024 07/19/19 0526 07/19/19 1023  WBC 9.8 12.0*  --  8.1 10.2  NEUTROABS 6.4  --   --   --   --   HGB 8.7 Repeated and verified X2.* 8.3* 8.3* 7.5*  7.7* 8.7*  HCT 26.4 Repeated and verified X2.* 27.5* 27.8* 24.8*  25.0* 28.4*  MCV 90.2 96.2  --  96.2 96.6  PLT 594.0* 606*  --  503* [id number]*   Basic Metabolic Panel: Recent Labs  Lab 07/13/19 1236 07/18/19 1034 07/19/19 1023  NA 132* 133* 134*  K 4.4 3.9 3.7  CL 102 99 107  CO2 25 28 22   GLUCOSE 87 90 94  BUN 8 10 5*  CREATININE 0.39* 0.42* 0.42*  CALCIUM 7.5* 7.8* 7.2*   GFR: Estimated Creatinine Clearance: 61.6 mL/min (A) (by C-G formula based on SCr of 0.42 mg/dL (L)). Liver Function Tests: Recent Labs  Lab 07/13/19 1236 07/18/19 1034 07/19/19 1023  AST 25 14* 15  ALT 114* 36 27  ALKPHOS 405* 273* 225*  BILITOT 0.5 0.5 0.8  PROT 4.6* 4.9* 4.4*  ALBUMIN 2.0* 1.8* 1.6*   No results for input(s): LIPASE, AMYLASE in the last 168 hours. No results for input(s): AMMONIA in the last 168 hours. Coagulation Profile: Recent Labs  Lab 07/18/19 1034 07/19/19 0526  INR 1.0 1.2   Cardiac Enzymes: No results for input(s): CKTOTAL, CKMB, CKMBINDEX, TROPONINI in the last 168 hours. BNP (last 3 results) No results for input(s): PROBNP in the last 8760 hours. HbA1C: No results for input(s): HGBA1C in the last 72 hours. CBG: No results for input(s): GLUCAP in the last 168 hours. Lipid Profile: No results for input(s): CHOL, HDL, LDLCALC, TRIG, CHOLHDL, LDLDIRECT in the last 72 hours. Thyroid Function Tests: No results for input(s): TSH, T4TOTAL, FREET4, T3FREE, THYROIDAB in the last 72 hours. Anemia Panel: Recent Labs    07/19/19 1023  VITAMINB12 440  FERRITIN 47   Sepsis Labs: No results for input(s): PROCALCITON, LATICACIDVEN in the last 168 hours.  Recent Results (from the past 240 hour(s))  Urine Culture     Status: None   Collection Time: 07/13/19 12:36 PM    Specimen: Blood  Result Value Ref Range Status   MICRO NUMBER: [id number]  Final   SPECIMEN QUALITY: Adequate  Final   Sample Source NOT GIVEN  Final   STATUS: FINAL  Final   ISOLATE 1:   Final    Greater than 100,000 CFU/mL of Non-uropathogenic Gram positive organism May represent colonizers from external and internal genitalia. No further testing (including susceptibility) will be performed.  SARS CORONAVIRUS 2 (TAT 6-24 HRS) Nasopharyngeal Nasopharyngeal Swab     Status: None   Collection Time: 07/18/19  2:12 PM   Specimen: Nasopharyngeal Swab  Result Value Ref Range Status   SARS Coronavirus 2 NEGATIVE NEGATIVE Final    Comment: (NOTE) SARS-CoV-2 target nucleic acids are NOT DETECTED. The SARS-CoV-2 RNA is generally detectable in upper and lower respiratory specimens during the acute phase of infection. Negative results do  not preclude SARS-CoV-2 infection, do not rule out co-infections with other pathogens, and should not be used as the sole basis for treatment or other patient management decisions. Negative results must be combined with clinical observations, patient history, and epidemiological information. The expected result is Negative. Fact Sheet for Patients: SugarRoll.be Fact Sheet for Healthcare Providers: https://www.woods-mathews.com/ This test is not yet approved or cleared by the [place] FDA and  has been authorized for detection and/or diagnosis of SARS-CoV-2 by FDA under an Emergency Use Authorization (EUA). This EUA will remain  in effect (meaning this test can be used) for the duration of the COVID-19 declaration under Section 56 4(b)(1) of the Act, 21 U.S.C. section 360bbb-3(b)(1), unless the authorization is terminated or revoked sooner. Performed at [facility], [place] [address] [place], [place] [address]          Radiology Studies: US Abdomen Complete  Result Date: 07/18/2019 CLINICAL DATA:   Weight loss and elevated liver enzymes EXAM: ABDOMEN ULTRASOUND COMPLETE COMPARISON:  March 05, 2017 FINDINGS: Gallbladder: Surgically absent. Common bile duct: Diameter: 4 mm. No intrahepatic, common hepatic, or common bile duct dilatation. Liver: No focal lesion identified. Within normal limits in parenchymal echogenicity. Portal vein is patent on color Doppler imaging with normal direction of blood flow towards the liver. IVC: No abnormality visualized. Pancreas: No pancreatic mass or inflammatory focus. Spleen: Size and appearance within normal limits. Right Kidney: Length: 11.8 cm. Echogenicity within normal limits. No mass or hydronephrosis visualized. Left Kidney: Length: 11.2 cm. Echogenicity within normal limits. No mass or hydronephrosis visualized. Abdominal aorta: No aneurysm visualized. Other findings: No demonstrable ascites. IMPRESSION: Gallbladder absent.  Study otherwise unremarkable. Electronically Signed   By: Lowella Grip III M.D.   On: 07/18/2019 13:13        Scheduled Meds: . calcium carbonate  625 mg Oral Q breakfast  . cholecalciferol  2,000 Units Oral Daily  . pantoprazole (PROTONIX) IV  40 mg Intravenous Q12H  . QUEtiapine  800 mg Oral QHS  . sucralfate  1 g Oral TID AC & HS  . umeclidinium bromide  1 puff Inhalation Daily   Continuous Infusions: . sodium chloride 75 mL/hr at 07/18/19 2106  . sodium chloride    . ferumoxytol       LOS: 0 days     Georgette Shell, MD [facility]  If 7PM-7AM, please contact night-coverage [email or web address] Password [facility] 07/19/2019, 11:38 AM

## 2019-07-19 NOTE — Progress Notes (Signed)
   07/19/19 0900  OT Visit Information  Last OT Received On 07/19/19  Reason Eval/Treat Not Completed Patient at procedure or test/ unavailable (Endo)   Plan to reattempt.  Tyrone Schimke, OT Acute Rehabilitation Services Pager: [phone] Office: [phone]

## 2019-07-19 NOTE — Telephone Encounter (Signed)
Patient stated she missed a call from our office Patient is in the hospital and was not able to get to her phone in time to pick up ?

## 2019-07-19 NOTE — Evaluation (Signed)
Occupational Therapy Evaluation Patient Details Name: Robin Welch MRN: [id number] DOB: December 03, 1965 Today's Date: 07/19/2019    History of Present Illness 53 y.o. female with pmhx of Barrett's Esophagus, PUD, Bipolar Disorder 1, Anxiety, COPD, Tobacco abuse (current 2 ppd), and Osteoporosis who presents after recent bouts of hematemesis. H/o recent proximal humeral fracture after recurrent fall; planned non-operative management.     Clinical Impression  Pt admitted with the above diagnoses and presents with below problem list. Pt will benefit from continued acute OT to address the below listed deficits and maximize independence with basic ADLs prior to d/c home. At baseline, pt is independent with ADLs and mobility but endorses needing some help with dressing and struggling with bathing tasks since she sustaining a humeral fx recently. Pt currently supervision with functional transfers and mobility, min A with UB/LB bathing/dressing.      Follow Up Recommendations  Home health OT;Other (comment)(Pt reports HH therapy about to start PTA)    Equipment Recommendations  3 in 1 bedside commode    Recommendations for Other Services       Precautions / Restrictions Precautions Precautions: Other (comment)(recent proximal humeral fracture) Precaution Comments: currently nonoperative managment for RUE fx. for f/u OP x-ray later this week  Required Braces or Orthoses: Sling Restrictions Weight Bearing Restrictions: (no RUE WB orders in but treated as NWB during OT session) Other Position/Activity Restrictions: sling at all times except bathing/dressing      Mobility Bed Mobility Overal bed mobility: Modified Independent             General bed mobility comments: exited bed on L side.   Transfers Overall transfer level: Needs assistance Equipment used: None Transfers: Sit to/from Stand Sit to Stand: Supervision         General transfer comment: supervision for safety     Balance Overall balance assessment: Mild deficits observed, not formally tested                                         ADL either performed or assessed with clinical judgement   ADL Overall ADL's : Needs assistance/impaired Eating/Feeding: Set up;Sitting   Grooming: Minimal assistance;Set up;Sitting   Upper Body Bathing: Set up;Minimal assistance;Sitting   Lower Body Bathing: Set up;Supervison/ safety;Minimal assistance;Sit to/from stand   Upper Body Dressing : Set up;Minimal assistance;Sitting   Lower Body Dressing: Set up;Supervision/safety;Minimal assistance;Sit to/from stand   Toilet Transfer: Supervision/safety;Ambulation   Toileting- Clothing Manipulation and Hygiene: Supervision/safety;Sit to/from stand   Tub/ Shower Transfer: Tub transfer;Min guard;3 in 1   Functional mobility during ADLs: Supervision/safety General ADL Comments: Pt completed bed mobility, functional mobility in the room, toilet transfer and pericare.      Vision         Perception     Praxis      Pertinent Vitals/Pain Pain Assessment: Faces Faces Pain Scale: Hurts little more Pain Location: RUE Pain Descriptors / Indicators: Sore     Hand Dominance Right   Extremity/Trunk Assessment Upper Extremity Assessment Upper Extremity Assessment: RUE deficits/detail RUE Deficits / Details: immobilized in sling. adjusted sling during session.    Lower Extremity Assessment Lower Extremity Assessment: Defer to PT evaluation       Communication Communication Communication: No difficulties   Cognition Arousal/Alertness: Awake/alert Behavior During Therapy: WFL for tasks assessed/performed Overall Cognitive Status: Within Functional Limits for tasks assessed  General Comments       Exercises     Shoulder Instructions      Home Living Family/patient expects to be discharged to:: Private residence Living  Arrangements: Children Available Help at Discharge: Family Type of Home: Apartment Home Access: Stairs to enter;Other (comment)(3rd level apartment)   Entrance Stairs-Rails: Right Home Layout: One level     Bathroom Shower/Tub: Tub/shower unit         Home Equipment: None          Prior Functioning/Environment Level of Independence: Independent;Needs assistance    ADL's / Homemaking Assistance Needed: baseline independent but endorses needing assist with dressing since she sustained humeral fx.             OT Problem List: Impaired balance (sitting and/or standing);Decreased knowledge of use of DME or AE;Decreased knowledge of precautions;Pain;Impaired UE functional use      OT Treatment/Interventions: Self-care/ADL training;DME and/or AE instruction;Therapeutic activities;Patient/family education;Balance training    OT Goals(Current goals can be found in the care plan section) Acute Rehab OT Goals Patient Stated Goal: home, regain independence OT Goal Formulation: With patient Time For Goal Achievement: 07/26/19 Potential to Achieve Goals: Good ADL Goals Pt Will Perform Upper Body Dressing: with set-up;sitting Pt Will Perform Lower Body Dressing: with modified independence;sit to/from stand Pt Will Perform Tub/Shower Transfer: Tub transfer;with modified independence;3 in 1  OT Frequency: Min 2X/week   Barriers to D/C:            Co-evaluation              AM-PAC OT "6 Clicks" Daily Activity     Outcome Measure Help from another person eating meals?: None Help from another person taking care of personal grooming?: A Little Help from another person toileting, which includes using toliet, bedpan, or urinal?: None Help from another person bathing (including washing, rinsing, drying)?: A Little Help from another person to put on and taking off regular upper body clothing?: A Little Help from another person to put on and taking off regular lower body  clothing?: A Little 6 Click Score: 20   End of Session Equipment Utilized During Treatment: Other (comment)(sling)  Activity Tolerance: Patient tolerated treatment well Patient left: in chair;with call bell/phone within reach  OT Visit Diagnosis: Unsteadiness on feet (R26.81);Pain Pain - Right/Left: Right Pain - part of body: Arm                Time: 1246-1305 OT Time Calculation (min): 19 min Charges:  OT General Charges $OT Visit: 1 Visit OT Evaluation $OT Eval Low Complexity: Aubrey, OT Acute Rehabilitation Services Pager: [phone] Office: [phone]   Hortencia Pilar 07/19/2019, 1:30 PM

## 2019-07-19 NOTE — Progress Notes (Signed)
PT Cancellation Note  Patient Details Name: Robin Welch MRN: [id number] DOB: 1966-05-07   Cancelled Treatment:    Reason Eval/Treat Not Completed: Patient at procedure or test/unavailable(endo)   York Ram E 07/19/2019, 8:53 AM Carmelia Bake, PT, DPT Acute Rehabilitation Services Office: [phone] Pager: [phone]

## 2019-07-19 NOTE — Op Note (Signed)
[facility] Patient Name: Robin Welch Procedure Date: 07/19/2019 MRN: [id number] Attending MD: Gatha Mayer , MD Date of Birth: 1965/10/29 CSN: [id number] Age: 53 Admit Type: Outpatient Procedure:                Upper GI endoscopy Indications:              Hematemesis Providers:                Gatha Mayer, MD, Angus Seller, Lina Sar,                            Technician Referring MD:              Medicines:                Midazolam 5 mg IV, Fentanyl 25 micrograms IV,                            Diphenhydramine 25 mg IV, Cetacaine spray Complications:            No immediate complications. Estimated Blood Loss:     Estimated blood loss was minimal. Procedure:                Pre-Anesthesia Assessment:                           - Prior to the procedure, a History and Physical                            was performed, and patient medications and                            allergies were reviewed. The patient's tolerance of                            previous anesthesia was also reviewed. The risks                            and benefits of the procedure and the sedation                            options and risks were discussed with the patient.                            All questions were answered, and informed consent                            was obtained. Prior Anticoagulants: The patient has                            taken no previous anticoagulant or antiplatelet                            agents. ASA Grade Assessment: III - A patient with  severe systemic disease. After reviewing the risks                            and benefits, the patient was deemed in                            satisfactory condition to undergo the procedure.                           After obtaining informed consent, the endoscope was                            passed under direct vision. Throughout the                            procedure, the  patient's blood pressure, pulse, and                            oxygen saturations were monitored continuously. The                            [id number]-H190 ([id number] Olympus gastroscope was                            introduced through the mouth, and advanced to the                            second part of duodenum. The GIF-[id number] ([id number])                            Olympus ultra slim endoscope was introduced through                            the and advanced to the. The upper GI endoscopy was                            somewhat difficult due to stricture. Successful                            completion of the procedure was aided by                            withdrawing the scope and replacing with the                            pediatric endoscope. The patient tolerated the                            procedure well. Scope In: Scope Out: Findings:      LA Grade D (one or more mucosal breaks involving at least 75% of       esophageal circumference) esophagitis with bleeding was found in the       lower third of the esophagus. Biopsies were taken with a cold forceps  for histology. Verification of patient identification for the specimen       was done. Estimated blood loss was minimal.      One inflammatory severe stenosis was found 30 cm from the incisors. This       stenosis measured 1 cm (inner diameter). The stenosis was traversed       after downsizing scope.      A medium-sized hiatal hernia was present.      The cardia and gastric fundus were normal on retroflexion.      The stomach was normal.      The examined duodenum was normal. Impression:               - LA Grade D reflux esophagitis with bleeding.                            Biopsied. terrible ulcerative esophagitis mid to                            distal esophagus - hard to say where z line is                           - Esophageal stenosis/stricture - adult scope would                            not pass - needed  ultraslim scope to pass.                           - Medium-sized hiatal hernia.                           - Normal stomach.                           - Normal examined duodenum. Moderate Sedation:      Moderate (conscious) sedation was administered by the endoscopy nurse       and supervised by the endoscopist. The following parameters were       monitored: oxygen saturation, heart rate, blood pressure, respiratory       rate, EKG, adequacy of pulmonary ventilation, and response to care.       Total physician intraservice time was 15 minutes. Recommendation:           - Return patient to hospital ward for ongoing care.                           - Clear liquid diet.                           - CONTINUE BID IV PPI AND CARAFATE                           I ADDED IVF'S                           FERAHEME X 1  REFLUX PRECAUTIONS                           LABS - RECHK CBC,CMET (HAS ABNL TA'S AND ALK PHOS                            NIGHT NOT BE FROM LIVER) GASTRIN LEVEL FERRITIN B12                           SHE MAY NEED A UNIT OR 2 OF BLOOD -                           STRICTURE NOT DILATED AS SO MUCH INFLAMMATION                           I THINK SHE NEEDS TO STAY TODAY AND HOPEFULLY HOME                            TOMORROW                           TRY FOR A PUREED DIET NOT BEYOND                           WILL NEED F/U DR. NANDIGAM - WILL NEED RECHECK EGD                            AND A DILATION OF ESOPHAGUS                           BID PPI AT DC                           DIETITIAN EVAL RE: NUTRITION SUPPLEMENTS Procedure Code(s):        --- Professional ---                           [phone], Esophagogastroduodenoscopy, flexible,                            transoral; with biopsy, single or multiple                           99152, 59, Moderate sedation services provided by                            the same physician or other qualified health care                             professional performing the diagnostic or                            therapeutic service that the sedation supports,  requiring the presence of an independent trained                            observer to assist in the monitoring of the                            patient's level of consciousness and physiological                            status; initial 15 minutes of intraservice time,                            patient age 43 years or older Diagnosis Code(s):        --- Professional ---                           K21.01, Gastro-esophageal reflux disease with                            esophagitis, with bleeding                           K22.2, Esophageal obstruction                           K44.9, Diaphragmatic hernia without obstruction or                            gangrene                           K92.0, Hematemesis CPT copyright 2019 [employer]. All rights reserved. The codes documented in this report are preliminary and upon coder review may  be revised to meet current compliance requirements. Gatha Mayer, MD 07/19/2019 9:13:13 AM This report has been signed electronically. Number of Addenda: 0

## 2019-07-19 NOTE — Telephone Encounter (Signed)
Cindy with [facility] left a voicemail stating that they are scheduled to go see the patient Friday 07/21/19. Jenny Reichmann stated that they will be do a PT evaluation and treat, Jenny Reichmann stated that after the evaluation they will see about a home health aid and social worker going out. Jenny Reichmann requested a call back to confirm that you got this message.

## 2019-07-19 NOTE — Telephone Encounter (Signed)
Noted. Pt currently hospitalized, hopefully should be out of hospital by Friday.

## 2019-07-19 NOTE — Evaluation (Signed)
Physical Therapy Evaluation Patient Details Name: Robin Welch MRN: [id number] DOB: 08-Aug-1966 Today's Date: 07/19/2019   History of Present Illness  53 y.o. female with pmhx of Barrett's Esophagus, PUD, Bipolar Disorder 1, Anxiety, COPD, Tobacco abuse (current 2 ppd), and Osteoporosis who presents after recent bouts of hematemesis. H/o proximal humeral fracture after recurrent fall; planned non-operative management.  Pt s/p EGD 07/19/19 that showed grade D reflux esophagitis with bleeding biopsied, terrible ulcerative esophagitis mid distal esophagus, esophageal stenosis and stricture, normal stomach medium sized hiatal hernia normal examined duodenum.  Pts hgb is 7.5    Clinical Impression  Pt admitted with above diagnosis. Pt was able to demonstrate safe transfers and gait with excellent DGI score.  She reports only 1 fall in which she tripped in the middle of the night.  She does have some difficulty with ADLs due to R UE fractured - but this is being addressed by OT. No PT needs.      Follow Up Recommendations No PT follow up    Equipment Recommendations  None recommended by PT    Recommendations for Other Services       Precautions / Restrictions Precautions Precautions: Fall Precaution Comments: currently nonoperative managment for RUE fx. for f/u OP x-ray later this week -treated as NWB Required Braces or Orthoses: Sling Restrictions RUE Weight Bearing: Non weight bearing Other Position/Activity Restrictions: sling at all times except bathing/dressing      Mobility  Bed Mobility Overal bed mobility: Independent                Transfers Overall transfer level: Needs assistance   Transfers: Sit to/from Stand Sit to Stand: Supervision            Ambulation/Gait Ambulation/Gait assistance: Supervision Gait Distance (Feet): 500 Feet Assistive device: None Gait Pattern/deviations: WFL(Within Functional Limits)   Gait velocity interpretation: >4.37 ft/sec,  indicative of normal walking speed    Stairs            Wheelchair Mobility    Modified Rankin (Stroke Patients Only)       Balance Overall balance assessment: Independent                               Standardized Balance Assessment Standardized Balance Assessment : Dynamic Gait Index   Dynamic Gait Index Level Surface: Normal Change in Gait Speed: Normal Gait with Horizontal Head Turns: Normal Gait with Vertical Head Turns: Normal Gait and Pivot Turn: Normal Step Over Obstacle: Normal Step Around Obstacles: Normal Steps: Normal Total Score: 24       Pertinent Vitals/Pain Pain Assessment: No/denies pain    Home Living Family/patient expects to be discharged to:: Private residence Living Arrangements: Children Available Help at Discharge: Family Type of Home: Apartment Home Access: Stairs to enter;Other (comment) Entrance Stairs-Rails: Right   Home Layout: One level Home Equipment: None      Prior Function        ADL's / Homemaking Assistance Needed: baseline independent but endorses needing assist with dressing since she sustained humeral fx.   Comments: Reports fall that caused her to fracture humerus was at 1230 in morning when she leaned over to pick up something and tripped on her purse that was in the floor - denies other falls     Hand Dominance   Dominant Hand: Right    Extremity/Trunk Assessment   Upper Extremity Assessment Upper Extremity Assessment: Defer to OT evaluation  Lower Extremity Assessment Lower Extremity Assessment: Overall WFL for tasks assessed       Communication   Communication: No difficulties  Cognition Arousal/Alertness: Awake/alert Behavior During Therapy: WFL for tasks assessed/performed Overall Cognitive Status: Within Functional Limits for tasks assessed                                        General Comments General comments (skin integrity, edema, etc.): educated to  take her time with trasnfers, recommended step to pattern on stairs for safety    Exercises     Assessment/Plan    PT Assessment Patent does not need any further PT services  PT Problem List         PT Treatment Interventions      PT Goals (Current goals can be found in the Care Plan section)  Acute Rehab PT Goals Patient Stated Goal: home, regain independence PT Goal Formulation: With patient Time For Goal Achievement: 08/02/19 Potential to Achieve Goals: Good    Frequency     Barriers to discharge        Co-evaluation               AM-PAC PT "6 Clicks" Mobility  Outcome Measure Help needed turning from your back to your side while in a flat bed without using bedrails?: None Help needed moving from lying on your back to sitting on the side of a flat bed without using bedrails?: None Help needed moving to and from a bed to a chair (including a wheelchair)?: None Help needed standing up from a chair using your arms (e.g., wheelchair or bedside chair)?: None Help needed to walk in hospital room?: None Help needed climbing 3-5 steps with a railing? : None 6 Click Score: 24    End of Session Equipment Utilized During Treatment: Gait belt Activity Tolerance: Patient tolerated treatment well Patient left: in bed;with call bell/phone within reach Nurse Communication: Mobility status      Time: [id number] PT Time Calculation (min) (ACUTE ONLY): 15 min   Charges:   PT Evaluation $PT Eval Low Complexity: 1 Low          Maggie Font, PT Acute Rehab Services Pager [phone] [facility] [phone] [facility] [place] 07/19/2019, 5:29 PM

## 2019-07-20 ENCOUNTER — Encounter (HOSPITAL_COMMUNITY): Payer: Self-pay | Admitting: Internal Medicine

## 2019-07-20 LAB — COMPREHENSIVE METABOLIC PANEL
ALT: 23 U/L (ref 0–44)
AST: 15 U/L (ref 15–41)
Albumin: 1.3 g/dL — ABNORMAL LOW (ref 3.5–5.0)
Alkaline Phosphatase: 202 U/L — ABNORMAL HIGH (ref 38–126)
Anion gap: 7 (ref 5–15)
BUN: <5 mg/dL — ABNORMAL LOW (ref 6–20)
CO2: 22 mmol/L (ref 22–32)
Calcium: 7.6 mg/dL — ABNORMAL LOW (ref 8.9–10.3)
Chloride: 106 mmol/L (ref 98–111)
Creatinine, Ser: 0.35 mg/dL — ABNORMAL LOW (ref 0.44–1.00)
GFR calc Af Amer: >60 mL/min (ref >60)
GFR calc non Af Amer: >60 mL/min (ref >60)
Glucose, Bld: 82 mg/dL (ref 70–99)
Potassium: 3.9 mmol/L (ref 3.5–5.1)
Sodium: 135 mmol/L (ref 135–145)
Total Bilirubin: 0.6 mg/dL (ref 0.3–1.2)
Total Protein: 3.8 g/dL — ABNORMAL LOW (ref 6.5–8.1)

## 2019-07-20 LAB — CBC
HCT: 25.9 % — ABNORMAL LOW (ref 36.0–46.0)
Hemoglobin: 7.8 g/dL — ABNORMAL LOW (ref 12.0–15.0)
MCH: 29 pg (ref 26.0–34.0)
MCHC: 30.1 g/dL (ref 30.0–36.0)
MCV: 96.3 fL (ref 80.0–100.0)
Platelets: 571 10*3/uL — ABNORMAL HIGH (ref 150–400)
RBC: 2.69 MIL/uL — ABNORMAL LOW (ref 3.87–5.11)
RDW: 16.6 % — ABNORMAL HIGH (ref 11.5–15.5)
WBC: 8.1 10*3/uL (ref 4.0–10.5)
nRBC: 0 % (ref 0.0–0.2)

## 2019-07-20 LAB — HEMOGLOBIN AND HEMATOCRIT, BLOOD
HCT: 27.1 % — ABNORMAL LOW (ref 36.0–46.0)
Hemoglobin: 8.3 g/dL — ABNORMAL LOW (ref 12.0–15.0)

## 2019-07-20 MED ORDER — PANTOPRAZOLE SODIUM 40 MG PO TBEC: 40.0000 mg | DELAYED_RELEASE_TABLET | Freq: Two times a day (BID) | ORAL | 1 refills | Status: DC

## 2019-07-20 MED ORDER — SENNOSIDES-DOCUSATE SODIUM 8.6-50 MG PO TABS: 1.0000 | ORAL_TABLET | Freq: Every evening | ORAL | Status: DC | PRN

## 2019-07-20 MED ORDER — NICOTINE 21 MG/24HR TD PT24: 21.0000 mg | MEDICATED_PATCH | Freq: Every day | TRANSDERMAL | 0 refills | Status: DC

## 2019-07-20 NOTE — Telephone Encounter (Signed)
Cindy with [facility] left v/m that she just found out that pt is coming home today from hospital and [facility] PT will start on 07/21/19. The hospital added nursing services for pt as well and due to nursing availability the nursing services for pt will be delayed and wanted to let Dr G be aware and make sure that was OK with Dr Darnell Level. Cindy request cb on 07/21/19. FYI to Dr Baldwin Crown CMA.

## 2019-07-20 NOTE — Progress Notes (Signed)
Nutrition Note  RD consulted for diet education regarding dysphagia 1/pureed diet. Per GI, pt will be on a pureed diet until esophagus heals.  Reviewed pureed diet with patient over the phone. Placed pureed diet information in discharge instructions. Emphasized that pt is to follow diet until MD says otherwise.  Pt understands and has no questions at this time. Pt to discharge today.  Body mass index is 21.25 kg/m. Patient meets criteria for normal based on current BMI.   Current diet order is dysphagia 1, patient is consuming approximately 100% of meals at this time. Labs and medications reviewed.    If nutrition issues arise, please consult RD.   Robin Bibles, MS, RD, LDN Inpatient Clinical Dietitian Pager: [phone] After Hours Pager: [phone]

## 2019-07-20 NOTE — Telephone Encounter (Signed)
Spoke with [facility] relaying Dr. Synthia Innocent message.  Says she will check and see.

## 2019-07-20 NOTE — Progress Notes (Signed)
Went over discharge instructions with patient. Patient verbalized understanding of medicines, upcoming appointments and what to do in case of an emergency.

## 2019-07-20 NOTE — TOC Initial Note (Addendum)
Transition of Care [facility]) - Initial/Assessment Note    Patient Details  Name: Robin Welch MRN: [id number] Date of Birth: 03-14-1966  [facility] CM/SW Contact:    Trish Mage, LCSW Phone Number: 07/20/2019, 10:29 AM  Clinical Narrative:  Robin Welch was seen due to high risk of readmission.  She states she lives at home with sons, has support of daughter in [place], and has behavioral health needs met through [facility].  She has already been set up for [facility] services through [place], and asked me to coordinate with them.  She sees Dr Danise Mina as her PCP, and states her medications to this point have been affordable.  TOC will continue to follow during the course of hospitalization.  Patient asked me to request 3 in 1 per OT recommendation.  Done.                Expected Discharge Plan: [place] Barriers to Discharge: No Barriers Identified   Patient Goals and CMS Choice Patient states their goals for this hospitalization and ongoing recovery are:: "I'm working on getting along with one arm."      Expected Discharge Plan and Services Expected Discharge Plan: [place]   Discharge Planning Services: CM Consult   Living arrangements for the past 2 months: Single Family Home Expected Discharge Date: (unknown)                         HH Arranged: OT [place] Agency: [place] (now [facility]) Date [place]: 07/20/19 Time [place]: 1028 Representative spoke with at [place]: Ronalee Belts  Prior Living Arrangements/Services Living arrangements for the past 2 months: [place] with:: Adult Children, Minor Children Patient language and need for interpreter reviewed:: Yes Do you feel safe going back to the place where you live?: Yes      Need for Family Participation in Patient Care: Yes (Comment) Care giver support system in place?: Yes (comment)   Criminal Activity/Legal  Involvement Pertinent to Current Situation/Hospitalization: No - Comment as needed  Activities of Daily Living Home Assistive Devices/Equipment: Eyeglasses, Other (Comment)(right arm sling) ADL Screening (condition at time of admission) Patient's cognitive ability adequate to safely complete daily activities?: Yes Is the patient deaf or have difficulty hearing?: No Does the patient have difficulty seeing, even when wearing glasses/contacts?: No Does the patient have difficulty concentrating, remembering, or making decisions?: No Patient able to express need for assistance with ADLs?: Yes Does the patient have difficulty dressing or bathing?: Yes Independently performs ADLs?: No Communication: Independent Dressing (OT): Needs assistance Is this a change from baseline?: Pre-admission baseline Grooming: Needs assistance Is this a change from baseline?: Pre-admission baseline Feeding: Needs assistance Is this a change from baseline?: Pre-admission baseline Bathing: Needs assistance Is this a change from baseline?: Pre-admission baseline Toileting: Independent In/Out Bed: Independent Walks in Home: Independent Does the patient have difficulty walking or climbing stairs?: Yes Weakness of Legs: None Weakness of Arms/Hands: Right  Permission Sought/Granted                  Emotional Assessment Appearance:: Appears stated age Attitude/Demeanor/Rapport: Engaged Affect (typically observed): Appropriate Orientation: : Oriented to Self, Oriented to Place, Oriented to  Time, Oriented to Situation Alcohol / Substance Use: Not Applicable Psych Involvement: No (comment)  Admission diagnosis:  Anemia, unspecified type [D64.9] Patient Active Problem List   Diagnosis Date  Noted  . Ulcerative esophagitis   . Esophageal stricture   . Anemia   . Upper GI bleed 07/18/2019  . Frequent falls 07/15/2019  . Abnormal urinalysis 07/15/2019  . Closed fracture of right proximal humerus  07/09/2019  . Protein-calorie malnutrition ([place]) 07/08/2019  . Ataxia 07/08/2019  . Hypocalcemia 07/06/2019  . Pulmonary nodule less than 1 cm in diameter with moderate to high risk for malignant neoplasm 06/01/2019  . Closed fracture distal radius and ulna, right, initial encounter 04/20/2019  . Diarrhea of presumed infectious origin 01/31/2019  . GAD (generalized anxiety disorder) 09/02/2018  . Insomnia 09/02/2018  . Low serum vitamin B12 07/19/2018  . Memory loss 06/20/2018  . Osteoporosis 06/09/2018  . Chronic constipation 04/19/2018  . Gastroesophageal reflux disease 04/19/2018  . Hematemesis 04/19/2018  . Chronic hyponatremia 03/11/2017  . Iron deficiency anemia 03/11/2017  . Thrombocytosis ([place]) 03/02/2017  . Non-intractable vomiting with nausea 03/01/2017  . History of cocaine dependence ([place])   . Migraine 05/08/2015  . Hoarseness of voice 04/04/2015  . Weight loss, unintentional 03/08/2015  . COPD (chronic obstructive pulmonary disease) with chronic bronchitis ([place]) 09/22/2013  . Tobacco abuse 12/18/2010  . Bipolar 1 disorder ([place]) 12/18/2010   PCP:  Ria Bush, MD Pharmacy:   [facility] - [place], [address] [place] [address] Phone: [phone] Fax: [phone]  [facility] [place], [place] - [place] [address] AT [address] [place] [address] Phone: [phone] Fax: [phone]     Social Determinants of Health (SDOH) Interventions    Readmission Risk Interventions Readmission Risk Prevention Plan 07/09/2019  Transportation Screening Complete  PCP or Specialist Appt within 3-5 Days Complete  HRI or [place] Complete  Social Work Consult for [place] Planning/Counseling Not Complete  Palliative Care Screening Not Applicable  Medication Review (RN Care Manager) Referral to Pharmacy  Some recent data might be hidden

## 2019-07-20 NOTE — Discharge Summary (Addendum)
Physician Discharge Summary  Robin Welch [id number] DOB: [birth year] DOA: 07/18/2019  PCP: Ria Bush, MD  Admit date: 07/18/2019 Discharge date: 07/20/2019  Admitted From:  Disposition:    Recommendations for Outpatient Follow-up:  1. Follow up with PCP in 1-2 weeks 2. Please obtain BMP/CBC in one week 3. Please follow up with Dr Silverio Decamp  Home Health: Yes Equipment/Devices: None  Discharge Condition: Stable and improved  CODE STATUS: Full code  diet recommendation: Cardiac pured diet Brief/Interim Summary:53 y.o.femalewithpmhx of Barrett's Esophagus, PUD, Bipolar Disorder 1, Anxiety, COPD, Tobacco abuse (current 2 ppd), and Osteoporosis who presents after recent bouts of hematemesis. Patient reports she has been having ongoing hematemesis which actually stopped 2 weeks prior. She states for the past month after recent traumatic fracture of her RUE she has been using NSAIDs daily - both ibuprofen and naproxen for pain control, while also taking her baby aspirin. She states up until two weeks ago she routinely would vomit up small amount of blood but occasionally large amounts of bright red blood as well. She states at that time she was started of carafate and noticed marked improvement and then she began dexilant this past Thursday. She has not had any abdominal pain, melena, or recent hematemesis. She states she has been eating and drinking well without issue.  She denies any fevers, chills, cough, sob, chest pain. Normal bowel habits.  She does smoke - still 2 ppds, no alcohol or drug use.  She lives at home with her two sons. She ambulates without any DME.  Discharge Diagnoses:  Active Problems:   Upper GI bleed   Ulcerative esophagitis   Esophageal stricture   Anemia  # UGI bleed/ABLA-patient has a history of erosive and ulcerative esophagitis with NSAID and aspirin use.  She was admitted with recurrent hematemesis and anemia she does have history of  peptic ulcer disease.  EGD 07/19/2019 showed --grade D reflux esophagitis with bleeding biopsied, terrible ulcerative esophagitis mid distal esophagus, esophageal stenosis and stricture, normal stomach medium sized hiatal hernia normal examined duodenum. GI recommends clear liquid diet, continue Protonix twice daily with Carafate.she received Feraheme.  Her hemoglobin was trended and stayed above 7 at all times.  On the day of discharge her hemoglobin is 8.3.  Gastrin level ordered and pending. She tolerated a pured diet prior to discharge.  She will follow up with Dr. Silverio Decamp for EGD and dilatation and follow-up of biopsy results. Patient will be discharged with home health PT OT aide and RN and social work. She needs gait training transfer training and stair training. She is unable to leave home without assistance and has unsafe ambulation due to balance issues.   # Recent traumatic fracture of R. Humerus-pain controlled with oxycodone.   # Recent UTI - s/p cephalexin  # Bipolar Disorder - continue HS Quetiapine  # COPD-not wheezing continue home inhalers.  # Tobacco abuse-patient smokes 2 pack/day.  Counseled regarding the risk of ongoing smoking.  Continue nicotine patch upon discharge.  #Elevated alkaline phosphatase trending down.    Estimated body mass index is 21.25 kg/m as calculated from the following:   Height as of this encounter: 5\' 2"  (1.575 m).   Weight as of this encounter: 52.7 kg.  Discharge Instructions  Discharge Instructions    Call MD for:  difficulty breathing, headache or visual disturbances   Complete by: As directed    Call MD for:  persistant nausea and vomiting   Complete by: As directed    Call  MD for:  severe uncontrolled pain   Complete by: As directed    Call MD for:  temperature >100.4   Complete by: As directed    Diet - low sodium heart healthy   Complete by: As directed    Increase activity slowly   Complete by: As directed       Allergies as of 07/20/2019      Reactions   Prozac [fluoxetine Hcl] Swelling, Rash   Doxycycline Nausea And Vomiting   Celexa [citalopram Hydrobromide] Other (See Comments)   Possible allergic reaction   Chantix [varenicline Tartrate] Swelling   Mouth swelling   Hydroxyzine Other (See Comments)   Not effective   Klonopin [clonazepam] Other (See Comments)   Caused instablility. Not able to drive due to over medicated   Lunesta [eszopiclone] Other (See Comments)   Not effective   Prozac [fluoxetine Hcl] Swelling   Sonata [zaleplon] Other (See Comments)   Not effective   Trazodone And Nefazodone Other (See Comments)   ineffective   Zolpimist [zolpidem Tartrate] Other (See Comments)   Not effective   Azithromycin Other (See Comments)   Reaction not recalled by the patient      Medication List    STOP taking these medications   aspirin 81 MG chewable tablet   cephALEXin 500 MG capsule Commonly known as: KEFLEX   dexlansoprazole 60 MG capsule Commonly known as: DEXILANT     TAKE these medications   albuterol 108 (90 Base) MCG/ACT inhaler Commonly known as: Ventolin HFA Inhale 1-2 puffs into the lungs every 6 (six) hours as needed for wheezing or shortness of breath.   ALPRAZolam 1 MG tablet Commonly known as: XANAX Take 1 tablet by mouth daily as needed for anxiety.   calcium carbonate 600 MG Tabs tablet Commonly known as: Calcium 600 Take 1 tablet (600 mg total) by mouth daily with breakfast.   methocarbamol 500 MG tablet Commonly known as: Robaxin Take 1 tablet (500 mg total) by mouth every 8 (eight) hours as needed for muscle spasms.   nicotine 21 mg/24hr patch Commonly known as: NICODERM CQ - dosed in mg/24 hours Place 1 patch (21 mg total) onto the skin daily. Start taking on: July 21, 2019   ondansetron 4 MG disintegrating tablet Commonly known as: ZOFRAN-ODT Take 1 tablet (4 mg total) by mouth every 8 (eight) hours as needed for nausea or  vomiting.   oxyCODONE 5 MG immediate release tablet Commonly known as: Oxy IR/ROXICODONE 1 po q 4-6hrs prn pain What changed:   how much to take  how to take this  when to take this  reasons to take this  additional instructions   pantoprazole 40 MG tablet Commonly known as: Protonix Take 1 tablet (40 mg total) by mouth 2 (two) times daily.   QUEtiapine 400 MG tablet Commonly known as: SEROQUEL Take 800 mg by mouth at bedtime.   senna-docusate 8.6-50 MG tablet Commonly known as: Senokot-S Take 1 tablet by mouth at bedtime as needed for mild constipation.   Spiriva Respimat 2.5 MCG/ACT Aers Generic drug: Tiotropium Bromide Monohydrate Inhale 1 puff into the lungs daily.   sucralfate 1 GM/10ML suspension Commonly known as: Carafate Take 10 mLs (1 g total) by mouth 4 (four) times daily.   Vitamin D 50 MCG (2000 UT) Caps Take 1 capsule (2,000 Units total) by mouth daily.      Follow-up Information    Ria Bush, MD Follow up.   Specialty: Family Medicine Contact information: [id number]  [place] [address] [phone]        Mauri Pole, MD Follow up.   Specialty: Gastroenterology Contact information: [place] [address] [phone]          Allergies  Allergen Reactions  . Prozac [Fluoxetine Hcl] Swelling and Rash  . Doxycycline Nausea And Vomiting  . Celexa [Citalopram Hydrobromide] Other (See Comments)    Possible allergic reaction  . Chantix [Varenicline Tartrate] Swelling    Mouth swelling  . Hydroxyzine Other (See Comments)    Not effective  . Klonopin [Clonazepam] Other (See Comments)    Caused instablility. Not able to drive due to over medicated  . Lunesta [Eszopiclone] Other (See Comments)    Not effective  . Prozac [Fluoxetine Hcl] Swelling  . Sonata [Zaleplon] Other (See Comments)    Not effective  . Trazodone And Nefazodone Other (See Comments)    ineffective  . Zolpimist  [Zolpidem Tartrate] Other (See Comments)    Not effective  . Azithromycin Other (See Comments)    Reaction not recalled by the patient    Consultations: GI  Procedures/Studies: Dg Shoulder Right  Result Date: 07/09/2019 CLINICAL DATA:  Proximal humeral fracture. EXAM: RIGHT SHOULDER - 2+ VIEW COMPARISON:  CT shoulder 07/08/2019 FINDINGS: Generalized osteopenia. Minimally displaced fracture of the surgical neck of the right proximal humerus with the fracture involving the greater tuberosity. No other acute fracture or dislocation. No aggressive osseous lesion. IMPRESSION: Minimally displaced fracture of the surgical neck of the right proximal humerus with the fracture involving the greater tuberosity. Electronically Signed   By: Kathreen Devoid   On: 07/09/2019 14:08   Ct Head Wo Contrast  Result Date: 06/21/2019 CLINICAL DATA:  Ataxia Frequent falls in the past several weeks per pt Seizures as a baby one episode per pt No hx of stroke per pt Ataxia, stroke suspected EXAM: CT HEAD WITHOUT CONTRAST TECHNIQUE: Contiguous axial images were obtained from the base of the skull through the vertex without intravenous contrast. COMPARISON:  None. FINDINGS: Brain: No acute intracranial hemorrhage. No focal mass lesion. No CT evidence of acute infarction. No midline shift or mass effect. No hydrocephalus. Basilar cisterns are patent. Vascular: No hyperdense vessel or unexpected calcification. Skull: Normal. Negative for fracture or focal lesion. Sinuses/Orbits: Paranasal sinuses and mastoid air cells are clear. Orbits are clear. Other: None. IMPRESSION: Normal head CT. Electronically Signed   By: Suzy Bouchard M.D.   On: 06/21/2019 15:54   Mr Brain Wo Contrast (neuro Protocol)  Result Date: 06/21/2019 CLINICAL DATA:  Ataxia.  Frequent falling. EXAM: MRI HEAD WITHOUT CONTRAST TECHNIQUE: Multiplanar, multiecho pulse sequences of the brain and surrounding structures were obtained without intravenous  contrast. COMPARISON:  Head CT same day FINDINGS: Brain: The brain has a normal appearance without evidence of malformation, atrophy, old or acute small or large vessel infarction, mass lesion, hemorrhage, hydrocephalus or extra-axial collection. Vascular: Major vessels at the base of the brain show flow. Venous sinuses appear patent. Skull and upper cervical spine: Normal. Sinuses/Orbits: Clear/normal. Other: None significant. IMPRESSION: Normal examination. No abnormality seen to explain the clinical presentation. Electronically Signed   By: Nelson Chimes M.D.   On: 06/21/2019 18:38   US Abdomen Complete  Result Date: 07/18/2019 CLINICAL DATA:  Weight loss and elevated liver enzymes EXAM: ABDOMEN ULTRASOUND COMPLETE COMPARISON:  March 05, 2017 FINDINGS: Gallbladder: Surgically absent. Common bile duct: Diameter: 4 mm. No intrahepatic, common hepatic, or common bile duct dilatation. Liver: No  focal lesion identified. Within normal limits in parenchymal echogenicity. Portal vein is patent on color Doppler imaging with normal direction of blood flow towards the liver. IVC: No abnormality visualized. Pancreas: No pancreatic mass or inflammatory focus. Spleen: Size and appearance within normal limits. Right Kidney: Length: 11.8 cm. Echogenicity within normal limits. No mass or hydronephrosis visualized. Left Kidney: Length: 11.2 cm. Echogenicity within normal limits. No mass or hydronephrosis visualized. Abdominal aorta: No aneurysm visualized. Other findings: No demonstrable ascites. IMPRESSION: Gallbladder absent.  Study otherwise unremarkable. Electronically Signed   By: Lowella Grip III M.D.   On: 07/18/2019 13:13   Ct Shoulder Right Wo Contrast  Result Date: 07/08/2019 CLINICAL DATA:  Fracture of the humerus. EXAM: CT OF THE UPPER RIGHT EXTREMITY WITHOUT CONTRAST TECHNIQUE: Multidetector CT imaging of the upper right extremity was performed according to the standard protocol. COMPARISON:  July 08, 2019 FINDINGS: Bones/Joint/Cartilage Again noted is an acute impacted of the proximal right humerus. The fracture planes extend to the greater tubercle and into the lesser tubercle as well. There is no fracture of the glenoid. There is a possible partially visualized fracture involving the anterior fifth rib on the right. Ligaments Suboptimally assessed by CT. Muscles and Tendons No significant intramuscular hematoma. Soft tissues There is a moderate-sized joint effusion. There is a trace right-sided pleural effusion that is partially visualized. Emphysematous changes are noted involving the partially visualized right lung. There is a small airspace opacity in the posterior right upper lobe. IMPRESSION: 1. Acute fracture of the proximal right humerus as detailed above. 2. Possible fracture of the anterior fifth rib on the right. This is only partially visualized. 3. Emphysema. Airspace opacity in the right upper lobe may represent atelectasis or developing infiltrate. 4. Trace right-sided pleural effusion that is only partially visualized. Electronically Signed   By: Constance Holster M.D.   On: 07/08/2019 22:54   Ct Wrist Right Wo Contrast  Result Date: 07/04/2019 CLINICAL DATA:  Follow-up radius and ulnar fractures. Evaluate healing. EXAM: CT OF THE RIGHT WRIST WITHOUT CONTRAST TECHNIQUE: Multidetector CT imaging of the right wrist was performed according to the standard protocol. Multiplanar CT image reconstructions were also generated. COMPARISON:  Radiographs 04/08/2019 FINDINGS: There is a long compression plate on the ulnar aspect of the ulna with multiple screws. I do not see any solid osseous bridging at the fracture site. Some attempted callus formation along the radial aspect of the ulna but it does appear to be bridging. There is a volar radial plate and multiple screws transfixing the distal radius fracture. I do not see any solid healing changes. Some areas of attempted bony ingrowth but no solid  union. Also, I do not see any articular bone covering the distal screws which appear to be in the joint. Widened scapholunate joint space suggesting ligamentous disruption. There is moderate osteoporosis noted. IMPRESSION: 1. No significant healing changes involving the ulnar fracture. No bony ingrowth or osseous bridging is identified. 2. No significant healing of the distal radius fracture. Significant bony resorptive changes. I do not see any articular bone covering the distal screws some of which protrude into the joint. Electronically Signed   By: Marijo Sanes M.D.   On: 07/04/2019 20:21   Dg Abd 2 Views  Result Date: 06/21/2019 CLINICAL DATA:  Abdominal pain and recent falls EXAM: ABDOMEN - 2 VIEW COMPARISON:  None. FINDINGS: Scattered large and small bowel gas is noted. Retained fecal material consistent with a degree of constipation is noted. No impaction  is seen. Postsurgical changes are noted in the right upper quadrant. No free air is seen. No bony abnormality is noted. IMPRESSION: Changes of constipation. Electronically Signed   By: Inez Catalina M.D.   On: 06/21/2019 15:19   Dg Humerus Right  Result Date: 07/08/2019 CLINICAL DATA:  Pt tripped and fell over her coffee table today and landed on her right shoulder. No prior injuries or surgeries. EXAM: RIGHT HUMERUS - 2+ VIEW COMPARISON:  None. FINDINGS: Significantly displaced and impacted fracture of the right humeral neck. There is apparent involvement of the lesser tuberosity. The remainder of the right humerus appears intact. There is a nondisplaced right rib fracture. No definite pneumothorax. IMPRESSION: 1. Displaced and impacted fracture of the right humeral neck with apparent involvement of the lesser tuberosity. 2. Nondisplaced right rib fracture.  No definite pneumothorax. Electronically Signed   By: Audie Pinto M.D.   On: 07/08/2019 14:42    (Echo, Carotid, EGD, Colonoscopy, ERCP)    Subjective:  Patient resting in bed  anxious to go home she tolerated her diet yesterday.  No nausea vomiting. Discharge Exam: Vitals:   07/19/19 2043 07/20/19 0436  BP: 118/81 128/81  Pulse: 97 (!) 102  Resp: 16 16  Temp: 98.2 F (36.8 C) 97.7 F (36.5 C)  SpO2: 100% 98%   Vitals:   07/19/19 1352 07/19/19 2043 07/20/19 0400 07/20/19 0436  BP: 104/72 118/81  128/81  Pulse: (!) 108 97  (!) 102  Resp: 20 16  16   Temp: 98 F (36.7 C) 98.2 F (36.8 C)  97.7 F (36.5 C)  TempSrc: Oral Oral  Oral  SpO2: 99% 100%  98%  Weight:   52.7 kg   Height:        General: Pt is alert, awake, not in acute distress Cardiovascular: RRR, S1/S2 +, no rubs, no gallops Respiratory: CTA bilaterally, no wheezing, no rhonchi Abdominal: Soft, NT, ND, bowel sounds + Extremities: no edema, no cyanosis    The results of significant diagnostics from this hospitalization (including imaging, microbiology, ancillary and laboratory) are listed below for reference.     Microbiology: Recent Results (from the past 240 hour(s))  Urine Culture     Status: None   Collection Time: 07/13/19 12:36 PM   Specimen: Blood  Result Value Ref Range Status   MICRO NUMBER: [id number]  Final   SPECIMEN QUALITY: Adequate  Final   Sample Source NOT GIVEN  Final   STATUS: FINAL  Final   ISOLATE 1:   Final    Greater than 100,000 CFU/mL of Non-uropathogenic Gram positive organism May represent colonizers from external and internal genitalia. No further testing (including susceptibility) will be performed.  SARS CORONAVIRUS 2 (TAT 6-24 HRS) Nasopharyngeal Nasopharyngeal Swab     Status: None   Collection Time: 07/18/19  2:12 PM   Specimen: Nasopharyngeal Swab  Result Value Ref Range Status   SARS Coronavirus 2 NEGATIVE NEGATIVE Final    Comment: (NOTE) SARS-CoV-2 target nucleic acids are NOT DETECTED. The SARS-CoV-2 RNA is generally detectable in upper and lower respiratory specimens during the acute phase of infection. Negative results do not preclude  SARS-CoV-2 infection, do not rule out co-infections with other pathogens, and should not be used as the sole basis for treatment or other patient management decisions. Negative results must be combined with clinical observations, patient history, and epidemiological information. The expected result is Negative. Fact Sheet for Patients: SugarRoll.be Fact Sheet for Healthcare Providers: https://www.woods-mathews.com/ This test is not yet approved  or cleared by the [place] and  has been authorized for detection and/or diagnosis of SARS-CoV-2 by FDA under an Emergency Use Authorization (EUA). This EUA will remain  in effect (meaning this test can be used) for the duration of the COVID-19 declaration under Section 56 4(b)(1) of the Act, 21 U.S.C. section 360bbb-3(b)(1), unless the authorization is terminated or revoked sooner. Performed at [facility], [place] [address] [place], [place] [address]      Labs: BNP (last 3 results) No results for input(s): BNP in the last 8760 hours. Basic Metabolic Panel: Recent Labs  Lab 07/13/19 1236 07/18/19 1034 07/19/19 1023 07/20/19 0311  NA 132* 133* 134* 135  K 4.4 3.9 3.7 3.9  CL 102 99 107 106  CO2 25 28 22 22   GLUCOSE 87 90 94 82  BUN 8 10 5* <5*  CREATININE 0.39* 0.42* 0.42* 0.35*  CALCIUM 7.5* 7.8* 7.2* 7.6*   Liver Function Tests: Recent Labs  Lab 07/13/19 1236 07/18/19 1034 07/19/19 1023 07/20/19 0311  AST 25 14* 15 15  ALT 114* 36 27 23  ALKPHOS 405* 273* 225* 202*  BILITOT 0.5 0.5 0.8 0.6  PROT 4.6* 4.9* 4.4* 3.8*  ALBUMIN 2.0* 1.8* 1.6* 1.3*   No results for input(s): LIPASE, AMYLASE in the last 168 hours. No results for input(s): AMMONIA in the last 168 hours. CBC: Recent Labs  Lab 07/13/19 1236 07/18/19 1034  07/19/19 0526 07/19/19 1023 07/19/19 1441 07/19/19 2032 07/20/19 0311 07/20/19 0843  WBC 9.8 12.0*  --  8.1 10.2  --   --  8.1  --    NEUTROABS 6.4  --   --   --   --   --   --   --   --   HGB 8.7 Repeated and verified X2.* 8.3*   < > 7.5*  7.7* 8.7* 7.5* 7.6* 7.8* 8.3*  HCT 26.4 Repeated and verified X2.* 27.5*   < > 24.8*  25.0* 28.4* 24.6* 25.1* 25.9* 27.1*  MCV 90.2 96.2  --  96.2 96.6  --   --  96.3  --   PLT 594.0* 606*  --  503* 722*  --   --  571*  --    < > = values in this interval not displayed.   Cardiac Enzymes: No results for input(s): CKTOTAL, CKMB, CKMBINDEX, TROPONINI in the last 168 hours. BNP: Invalid input(s): POCBNP CBG: No results for input(s): GLUCAP in the last 168 hours. D-Dimer No results for input(s): DDIMER in the last 72 hours. Hgb A1c No results for input(s): HGBA1C in the last 72 hours. Lipid Profile No results for input(s): CHOL, HDL, LDLCALC, TRIG, CHOLHDL, LDLDIRECT in the last 72 hours. Thyroid function studies No results for input(s): TSH, T4TOTAL, T3FREE, THYROIDAB in the last 72 hours.  Invalid input(s): FREET3 Anemia work up Recent Labs    07/19/19 1023  VITAMINB12 440  FERRITIN 47   Urinalysis    Component Value Date/Time   COLORURINE YELLOW 06/21/2019 [place] 06/21/2019 1555   LABSPEC 1.002 (L) 06/21/2019 1555   PHURINE 6.0 06/21/2019 1555   GLUCOSEU NEGATIVE 06/21/2019 1555   HGBUR NEGATIVE 06/21/2019 1555   BILIRUBINUR negative 07/13/2019 [place] 06/21/2019 1555   PROTEINUR Negative 07/13/2019 [place] 06/21/2019 1555   UROBILINOGEN 0.2 07/13/2019 1219   UROBILINOGEN 0.2 12/27/2010 1113   NITRITE negative 07/13/2019 1219   NITRITE NEGATIVE 06/21/2019 [place]  Large (3+) (A) 07/13/2019 1219   LEUKOCYTESUR NEGATIVE 06/21/2019 1555   Sepsis Labs Invalid input(s): PROCALCITONIN,  WBC,  LACTICIDVEN Microbiology Recent Results (from the past 240 hour(s))  Urine Culture     Status: None   Collection Time: 07/13/19 12:36 PM   Specimen: Blood  Result Value Ref Range Status   MICRO NUMBER:  [id number]  Final   SPECIMEN QUALITY: Adequate  Final   Sample Source NOT GIVEN  Final   STATUS: FINAL  Final   ISOLATE 1:   Final    Greater than 100,000 CFU/mL of Non-uropathogenic Gram positive organism May represent colonizers from external and internal genitalia. No further testing (including susceptibility) will be performed.  SARS CORONAVIRUS 2 (TAT 6-24 HRS) Nasopharyngeal Nasopharyngeal Swab     Status: None   Collection Time: 07/18/19  2:12 PM   Specimen: Nasopharyngeal Swab  Result Value Ref Range Status   SARS Coronavirus 2 NEGATIVE NEGATIVE Final    Comment: (NOTE) SARS-CoV-2 target nucleic acids are NOT DETECTED. The SARS-CoV-2 RNA is generally detectable in upper and lower respiratory specimens during the acute phase of infection. Negative results do not preclude SARS-CoV-2 infection, do not rule out co-infections with other pathogens, and should not be used as the sole basis for treatment or other patient management decisions. Negative results must be combined with clinical observations, patient history, and epidemiological information. The expected result is Negative. Fact Sheet for Patients: SugarRoll.be Fact Sheet for Healthcare Providers: https://www.woods-mathews.com/ This test is not yet approved or cleared by the [place] FDA and  has been authorized for detection and/or diagnosis of SARS-CoV-2 by FDA under an Emergency Use Authorization (EUA). This EUA will remain  in effect (meaning this test can be used) for the duration of the COVID-19 declaration under Section 56 4(b)(1) of the Act, 21 U.S.C. section 360bbb-3(b)(1), unless the authorization is terminated or revoked sooner. Performed at [facility], [place] [address] [place], [place] [address]      Time coordinating discharge:  50minutes  SIGNED:   Georgette Shell, MD  [facility] 07/20/2019, 10:54 AM Pager   If 7PM-7AM, please  contact night-coverage [email or web address] Password [email or web address]

## 2019-07-20 NOTE — Discharge Instructions (Signed)
Please stay on pured diet

## 2019-07-21 ENCOUNTER — Telehealth: Payer: Self-pay

## 2019-07-21 ENCOUNTER — Telehealth: Payer: Self-pay | Admitting: Psychiatry

## 2019-07-21 ENCOUNTER — Other Ambulatory Visit: Payer: Self-pay | Admitting: Family Medicine

## 2019-07-21 ENCOUNTER — Other Ambulatory Visit: Payer: Self-pay

## 2019-07-21 DIAGNOSIS — M25531 Pain in right wrist: Secondary | ICD-10-CM | POA: Diagnosis not present

## 2019-07-21 DIAGNOSIS — M80821D Other osteoporosis with current pathological fracture, right humerus, subsequent encounter for fracture with routine healing: Secondary | ICD-10-CM | POA: Diagnosis not present

## 2019-07-21 DIAGNOSIS — K2270 Barrett's esophagus without dysplasia: Secondary | ICD-10-CM | POA: Diagnosis not present

## 2019-07-21 DIAGNOSIS — K5909 Other constipation: Secondary | ICD-10-CM | POA: Diagnosis not present

## 2019-07-21 DIAGNOSIS — Z5189 Encounter for other specified aftercare: Secondary | ICD-10-CM | POA: Diagnosis not present

## 2019-07-21 DIAGNOSIS — M80031D Age-related osteoporosis with current pathological fracture, right forearm, subsequent encounter for fracture with routine healing: Secondary | ICD-10-CM | POA: Diagnosis not present

## 2019-07-21 DIAGNOSIS — M25511 Pain in right shoulder: Secondary | ICD-10-CM | POA: Diagnosis not present

## 2019-07-21 DIAGNOSIS — D509 Iron deficiency anemia, unspecified: Secondary | ICD-10-CM | POA: Diagnosis not present

## 2019-07-21 DIAGNOSIS — F411 Generalized anxiety disorder: Secondary | ICD-10-CM | POA: Diagnosis not present

## 2019-07-21 DIAGNOSIS — F319 Bipolar disorder, unspecified: Secondary | ICD-10-CM | POA: Diagnosis not present

## 2019-07-21 DIAGNOSIS — K222 Esophageal obstruction: Secondary | ICD-10-CM | POA: Diagnosis not present

## 2019-07-21 DIAGNOSIS — J449 Chronic obstructive pulmonary disease, unspecified: Secondary | ICD-10-CM | POA: Diagnosis not present

## 2019-07-21 DIAGNOSIS — S52531K Colles' fracture of right radius, subsequent encounter for closed fracture with nonunion: Secondary | ICD-10-CM | POA: Diagnosis not present

## 2019-07-21 LAB — SURGICAL PATHOLOGY

## 2019-07-21 LAB — GASTRIN: Gastrin: 161 pg/mL — ABNORMAL HIGH (ref 0–115)

## 2019-07-21 MED ORDER — QUETIAPINE FUMARATE 200 MG PO TABS: 800.0000 mg | ORAL_TABLET | Freq: Every day | ORAL | 0 refills | Status: DC

## 2019-07-21 NOTE — Telephone Encounter (Signed)
PT needs a RF of Seroquel to go to [facility]) because can't wait on mail order. Asking if you can Seroquel 200mg  4 hs because of esophageal problems if possible. 30 day or 90 day ok.

## 2019-07-21 NOTE — Telephone Encounter (Signed)
Please review patient concern section and appointment    Transition Care Management Follow-up Telephone Call    Date discharged? 07/18/2019   How have you been since you were released from the hospital? Patient states was given iron and IV fluids. She is feeling ok, states she went to ER because of her blood levels been low. She is not having any new or concerning symptoms at time this.    Do you understand why you were in the hospital? yes   Do you understand the discharge instructions? yes   Where were you discharged to? Home with her boys and her sister is there to help right now.   Items Reviewed:  Medications reviewed: yes  Allergies reviewed: yes  Dietary changes reviewed: yes  Referrals reviewed: yes   Functional Questionnaire:   Activities of Daily Living (ADLs):   She states they are independent in the following: not at this time. States they require assistance with the following: dressing, bathing, dressing, toileting, hygiene, fixing food.   Any transportation issues/concerns?: No.   Any patient concerns? Patient states she saw her orthopedic doctor at [facility] and he was not too positive about her arm fracture healing well. Patient did want to ask if she can take something for osteoporosis? Orthopedic doctor said there was medication but for patient to ask her PCP.   Confirmed importance and date/time of follow-up visits scheduled no-I did not see a 30 minute spot for this follow up in the 14 day deadline for TCM at this time. Where can we schedule patient?   Confirmed with patient if condition begins to worsen call PCP or go to the ER.  Patient was given the office number and encouraged to call back with question or concerns.  : yes

## 2019-07-21 NOTE — Telephone Encounter (Signed)
Yes plz schedule TCM f/u visit. Thanks.

## 2019-07-21 NOTE — Telephone Encounter (Signed)
Just filled today

## 2019-07-21 NOTE — Telephone Encounter (Signed)
Patient was sent to ER by GI office due to hemoglobin level drop on 07/18/2019 and she had EGD done. Would you like TCM done and follow up? Or follow up with GI/

## 2019-07-21 NOTE — Telephone Encounter (Signed)
Noted, submitted 30 day per her request of Seroquel 200 mg 4 at hs, not sure insurance will cover that dosing.

## 2019-07-21 NOTE — Telephone Encounter (Signed)
Spoke with [place] Jenny Reichmann was out of office) informing her Dr. Darnell Level is giving verbal orders for services requested by [place].  Stephanie verbalizes understanding and will document.

## 2019-07-21 NOTE — Telephone Encounter (Signed)
Ok to do this. Thanks. 

## 2019-07-22 NOTE — Progress Notes (Signed)
Let patient know that bxs show GERD inflammation no infection the gastrin level checked was ok  She needs:  Stay on PPI  See Dr. Silverio Decamp in 4-6 weeks approx

## 2019-07-24 ENCOUNTER — Telehealth: Payer: Self-pay | Admitting: Family Medicine

## 2019-07-24 ENCOUNTER — Other Ambulatory Visit: Payer: Self-pay

## 2019-07-24 NOTE — Telephone Encounter (Signed)
Lvm for Robin Welch informing her Dr. Darnell Level is giving verbal orders for services requested.

## 2019-07-24 NOTE — Telephone Encounter (Signed)
GRACEE with [facility] is requesting verbal orders for the patient's PHYSICAL THERAPY   1x week for 2 weeks 2x week for 4 weeks 1x week for 3 weeks     Ph- [phone]

## 2019-07-24 NOTE — Patient Outreach (Signed)
[facility]) Care Management  07/24/2019  Robin Welch July 02, 1966 [id number]    EMMI-General Discharge RED ON EMMI ALERT Day # 1 Date:07/22/2019 Red Alert Reason: "Know who to call about changes in condition? No  Scheduled follow-up? No  New prescriptions? I don't know  Other questions/problems? Yes"   Outreach attempt #1 to patient. Spoke with patient who voices she is doing well. She denies any acute issues or concerns at present. Reviewed and addressed red alerts with patient. She reports that she has misplaced her discharge instructions and that's why she answered the automated questions the way she did. RN CM went over d/c med list and instructions and MD follow up appts with patient over the phone. She voices that she has all her meds in the home and denies any issues regarding affording and/or managing them. She has PCP appt for 07/26/2019 and voices she plans to call GI office today to make an appt. Patient reports that she is able to drive and denies any issues or concerns with transportation. She denies any further RN CM needs or concerns at this time. Advised patient that they would get one more automated EMMI-GENERAL post discharge calls to assess how they are doing following recent hospitalization and will receive a call from a nurse if any of their responses were abnormal. Patient voiced understanding and was appreciative of f/u call.    Plan: RN CM will close case as no further interventions needed at this time.   Enzo Montgomery, RN,BSN,CCM [place] Management Telephonic Care Management Coordinator Direct Phone: [phone] Toll Free: [phone] Fax: [phone]

## 2019-07-24 NOTE — Telephone Encounter (Signed)
Patient advised. Patient can come in this Wednesday and I will have Morey Hummingbird schedule her. Also patient advised that Charmaine will give her a call once she is able to look into her insurance benefits for this shot.

## 2019-07-24 NOTE — Telephone Encounter (Signed)
Would recommend prolia Q6 mo shot. Will forward to Charmaine to price out for patient. Can patient come in on Wed at 9am 72min appt? If not let me know and I will schedule something for early next week.

## 2019-07-24 NOTE — Telephone Encounter (Signed)
Agree with this. Thank you.  

## 2019-07-25 DIAGNOSIS — S52531K Colles' fracture of right radius, subsequent encounter for closed fracture with nonunion: Secondary | ICD-10-CM | POA: Diagnosis not present

## 2019-07-26 ENCOUNTER — Telehealth: Payer: Self-pay | Admitting: Family Medicine

## 2019-07-26 ENCOUNTER — Ambulatory Visit: Payer: Medicare HMO | Admitting: Family Medicine

## 2019-07-26 DIAGNOSIS — K5909 Other constipation: Secondary | ICD-10-CM | POA: Diagnosis not present

## 2019-07-26 DIAGNOSIS — K2270 Barrett's esophagus without dysplasia: Secondary | ICD-10-CM | POA: Diagnosis not present

## 2019-07-26 DIAGNOSIS — M80821D Other osteoporosis with current pathological fracture, right humerus, subsequent encounter for fracture with routine healing: Secondary | ICD-10-CM | POA: Diagnosis not present

## 2019-07-26 DIAGNOSIS — M80031D Age-related osteoporosis with current pathological fracture, right forearm, subsequent encounter for fracture with routine healing: Secondary | ICD-10-CM | POA: Diagnosis not present

## 2019-07-26 DIAGNOSIS — D509 Iron deficiency anemia, unspecified: Secondary | ICD-10-CM | POA: Diagnosis not present

## 2019-07-26 DIAGNOSIS — F411 Generalized anxiety disorder: Secondary | ICD-10-CM | POA: Diagnosis not present

## 2019-07-26 DIAGNOSIS — K222 Esophageal obstruction: Secondary | ICD-10-CM | POA: Diagnosis not present

## 2019-07-26 DIAGNOSIS — F319 Bipolar disorder, unspecified: Secondary | ICD-10-CM | POA: Diagnosis not present

## 2019-07-26 DIAGNOSIS — J449 Chronic obstructive pulmonary disease, unspecified: Secondary | ICD-10-CM | POA: Diagnosis not present

## 2019-07-26 NOTE — Telephone Encounter (Signed)
Pt called this morning and she sounded horrible. She said she was sick and needed to cancel her appointment. I offered a virtual and she said she just wants to lay down and will call next week to schedule another appointment. Robin Welch wanted me to let you know what was going on with her.

## 2019-07-26 NOTE — Telephone Encounter (Signed)
Noted  

## 2019-07-31 ENCOUNTER — Ambulatory Visit: Payer: Medicare HMO | Admitting: Psychiatry

## 2019-07-31 NOTE — Telephone Encounter (Signed)
Attempted to contact pt.  Vm box is full.  Need to get update on pt. [Looks like OV r/s for 08/10/19 at 11:30.]

## 2019-07-31 NOTE — Telephone Encounter (Signed)
plz call today for update on symptoms and verify rescheduled appt.

## 2019-08-01 NOTE — Telephone Encounter (Addendum)
Spoke with pt asking how she is doing.  Says she is doing better.  Her arm was hurting so bad and she was not in the mood to talk to anyone.  Pt has c/x 08/10/19 OV.  She will call back when she can get a ride (can't drive right now), between multiple other appts and returns from traveling.  Fyi to Dr. Darnell Level.

## 2019-08-02 ENCOUNTER — Telehealth: Payer: Self-pay

## 2019-08-02 DIAGNOSIS — K5909 Other constipation: Secondary | ICD-10-CM | POA: Diagnosis not present

## 2019-08-02 DIAGNOSIS — D509 Iron deficiency anemia, unspecified: Secondary | ICD-10-CM | POA: Diagnosis not present

## 2019-08-02 DIAGNOSIS — K2270 Barrett's esophagus without dysplasia: Secondary | ICD-10-CM | POA: Diagnosis not present

## 2019-08-02 DIAGNOSIS — K222 Esophageal obstruction: Secondary | ICD-10-CM | POA: Diagnosis not present

## 2019-08-02 DIAGNOSIS — S52531K Colles' fracture of right radius, subsequent encounter for closed fracture with nonunion: Secondary | ICD-10-CM | POA: Diagnosis not present

## 2019-08-02 DIAGNOSIS — F411 Generalized anxiety disorder: Secondary | ICD-10-CM | POA: Diagnosis not present

## 2019-08-02 DIAGNOSIS — M80821D Other osteoporosis with current pathological fracture, right humerus, subsequent encounter for fracture with routine healing: Secondary | ICD-10-CM | POA: Diagnosis not present

## 2019-08-02 DIAGNOSIS — F319 Bipolar disorder, unspecified: Secondary | ICD-10-CM | POA: Diagnosis not present

## 2019-08-02 DIAGNOSIS — M80031D Age-related osteoporosis with current pathological fracture, right forearm, subsequent encounter for fracture with routine healing: Secondary | ICD-10-CM | POA: Diagnosis not present

## 2019-08-02 DIAGNOSIS — J449 Chronic obstructive pulmonary disease, unspecified: Secondary | ICD-10-CM | POA: Diagnosis not present

## 2019-08-02 NOTE — Telephone Encounter (Signed)
Jim OT with [facility] left v/m requesting verbal orders for [facility] OT 3 x a month for 1 month (verified with Clair Gulling this correct request). And HH home aide 1 x a wk for 1 wk and 2 x a wk for 4 wks.

## 2019-08-02 NOTE — Telephone Encounter (Signed)
Agree with this. Thank you.  

## 2019-08-03 ENCOUNTER — Other Ambulatory Visit: Payer: Self-pay | Admitting: Psychiatry

## 2019-08-03 DIAGNOSIS — D509 Iron deficiency anemia, unspecified: Secondary | ICD-10-CM

## 2019-08-03 DIAGNOSIS — K5909 Other constipation: Secondary | ICD-10-CM

## 2019-08-03 DIAGNOSIS — F17210 Nicotine dependence, cigarettes, uncomplicated: Secondary | ICD-10-CM

## 2019-08-03 DIAGNOSIS — F319 Bipolar disorder, unspecified: Secondary | ICD-10-CM

## 2019-08-03 DIAGNOSIS — M80821D Other osteoporosis with current pathological fracture, right humerus, subsequent encounter for fracture with routine healing: Secondary | ICD-10-CM | POA: Diagnosis not present

## 2019-08-03 DIAGNOSIS — G4700 Insomnia, unspecified: Secondary | ICD-10-CM

## 2019-08-03 DIAGNOSIS — M80031D Age-related osteoporosis with current pathological fracture, right forearm, subsequent encounter for fracture with routine healing: Secondary | ICD-10-CM | POA: Diagnosis not present

## 2019-08-03 DIAGNOSIS — F411 Generalized anxiety disorder: Secondary | ICD-10-CM

## 2019-08-03 DIAGNOSIS — Z9181 History of falling: Secondary | ICD-10-CM

## 2019-08-03 DIAGNOSIS — K222 Esophageal obstruction: Secondary | ICD-10-CM | POA: Diagnosis not present

## 2019-08-03 DIAGNOSIS — K2270 Barrett's esophagus without dysplasia: Secondary | ICD-10-CM | POA: Diagnosis not present

## 2019-08-03 DIAGNOSIS — F1420 Cocaine dependence, uncomplicated: Secondary | ICD-10-CM

## 2019-08-03 DIAGNOSIS — F5105 Insomnia due to other mental disorder: Secondary | ICD-10-CM

## 2019-08-03 DIAGNOSIS — J449 Chronic obstructive pulmonary disease, unspecified: Secondary | ICD-10-CM

## 2019-08-03 NOTE — Telephone Encounter (Signed)
Lvm for Clair Gulling informing him Dr. Darnell Level is giving verbal orders for services requested.

## 2019-08-04 DIAGNOSIS — M80031D Age-related osteoporosis with current pathological fracture, right forearm, subsequent encounter for fracture with routine healing: Secondary | ICD-10-CM | POA: Diagnosis not present

## 2019-08-04 DIAGNOSIS — F319 Bipolar disorder, unspecified: Secondary | ICD-10-CM | POA: Diagnosis not present

## 2019-08-04 DIAGNOSIS — F411 Generalized anxiety disorder: Secondary | ICD-10-CM | POA: Diagnosis not present

## 2019-08-04 DIAGNOSIS — J449 Chronic obstructive pulmonary disease, unspecified: Secondary | ICD-10-CM | POA: Diagnosis not present

## 2019-08-04 DIAGNOSIS — K2270 Barrett's esophagus without dysplasia: Secondary | ICD-10-CM | POA: Diagnosis not present

## 2019-08-04 DIAGNOSIS — M80821D Other osteoporosis with current pathological fracture, right humerus, subsequent encounter for fracture with routine healing: Secondary | ICD-10-CM | POA: Diagnosis not present

## 2019-08-04 DIAGNOSIS — D509 Iron deficiency anemia, unspecified: Secondary | ICD-10-CM | POA: Diagnosis not present

## 2019-08-04 DIAGNOSIS — K222 Esophageal obstruction: Secondary | ICD-10-CM | POA: Diagnosis not present

## 2019-08-04 DIAGNOSIS — K5909 Other constipation: Secondary | ICD-10-CM | POA: Diagnosis not present

## 2019-08-07 ENCOUNTER — Telehealth: Payer: Self-pay

## 2019-08-07 DIAGNOSIS — M80821D Other osteoporosis with current pathological fracture, right humerus, subsequent encounter for fracture with routine healing: Secondary | ICD-10-CM | POA: Diagnosis not present

## 2019-08-07 DIAGNOSIS — D509 Iron deficiency anemia, unspecified: Secondary | ICD-10-CM | POA: Diagnosis not present

## 2019-08-07 DIAGNOSIS — J449 Chronic obstructive pulmonary disease, unspecified: Secondary | ICD-10-CM | POA: Diagnosis not present

## 2019-08-07 DIAGNOSIS — K5909 Other constipation: Secondary | ICD-10-CM | POA: Diagnosis not present

## 2019-08-07 DIAGNOSIS — K222 Esophageal obstruction: Secondary | ICD-10-CM | POA: Diagnosis not present

## 2019-08-07 DIAGNOSIS — F319 Bipolar disorder, unspecified: Secondary | ICD-10-CM | POA: Diagnosis not present

## 2019-08-07 DIAGNOSIS — K2270 Barrett's esophagus without dysplasia: Secondary | ICD-10-CM | POA: Diagnosis not present

## 2019-08-07 DIAGNOSIS — F411 Generalized anxiety disorder: Secondary | ICD-10-CM | POA: Diagnosis not present

## 2019-08-07 DIAGNOSIS — M80031D Age-related osteoporosis with current pathological fracture, right forearm, subsequent encounter for fracture with routine healing: Secondary | ICD-10-CM | POA: Diagnosis not present

## 2019-08-07 NOTE — Telephone Encounter (Signed)
Pt left v/m that pt has to have a lot of dental work done and prolia injection was postponed due to that. pts daughter advised pt she should be able to take a pill for the osteoporosis and pt request cb after Dr Darnell Level has reviewed.

## 2019-08-08 NOTE — Telephone Encounter (Signed)
Both medicines (prolia and bisphosphonate) are not good options if she is undergoing dental work. The oral pill is also not a good option for her esophagus issues.  Other options include daily shot (teriparatide) or estrogen like medication (Evista) May want to start osteoporosis treatment when she completes her dental work. When is this planned for?

## 2019-08-08 NOTE — Telephone Encounter (Signed)
Spoke with pt relaying Dr. Synthia Innocent message.  Pt verbalizes understanding.  States dental work is to be done around the the first of the year.  She will call afterwards to let Dr. Darnell Level know what she wants to do.  Fyi to Dr. Darnell Level.

## 2019-08-10 ENCOUNTER — Telehealth: Payer: Self-pay

## 2019-08-10 ENCOUNTER — Ambulatory Visit: Payer: Medicare HMO | Admitting: Family Medicine

## 2019-08-10 NOTE — Telephone Encounter (Signed)
Gracee PT with [facility] left v/m that pt will miss all PT visits this wk per pt request due to pt being out of town this wk. FYI to Dr Danise Mina.

## 2019-08-10 NOTE — Telephone Encounter (Signed)
Noted  

## 2019-08-12 ENCOUNTER — Other Ambulatory Visit: Payer: Self-pay | Admitting: Family Medicine

## 2019-08-14 ENCOUNTER — Ambulatory Visit (INDEPENDENT_AMBULATORY_CARE_PROVIDER_SITE_OTHER): Payer: Medicare HMO | Admitting: Psychiatry

## 2019-08-14 ENCOUNTER — Encounter: Payer: Self-pay | Admitting: Psychiatry

## 2019-08-14 ENCOUNTER — Other Ambulatory Visit: Payer: Self-pay

## 2019-08-14 VITALS — Wt 111.0 lb

## 2019-08-14 DIAGNOSIS — F5105 Insomnia due to other mental disorder: Secondary | ICD-10-CM

## 2019-08-14 DIAGNOSIS — F319 Bipolar disorder, unspecified: Secondary | ICD-10-CM

## 2019-08-14 DIAGNOSIS — F1421 Cocaine dependence, in remission: Secondary | ICD-10-CM | POA: Diagnosis not present

## 2019-08-14 MED ORDER — QUETIAPINE FUMARATE 200 MG PO TABS: 800.0000 mg | ORAL_TABLET | Freq: Every day | ORAL | 1 refills | Status: DC

## 2019-08-14 MED ORDER — DAYVIGO 5 MG PO TABS: 5.0000 mg | ORAL_TABLET | Freq: Every day | ORAL | 0 refills | Status: DC
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
## 2019-08-15 DIAGNOSIS — S52531K Colles' fracture of right radius, subsequent encounter for closed fracture with nonunion: Secondary | ICD-10-CM | POA: Diagnosis not present

## 2019-08-15 DIAGNOSIS — M25511 Pain in right shoulder: Secondary | ICD-10-CM | POA: Diagnosis not present

## 2019-08-15 DIAGNOSIS — M25531 Pain in right wrist: Secondary | ICD-10-CM | POA: Diagnosis not present

## 2019-08-15 DIAGNOSIS — Z5189 Encounter for other specified aftercare: Secondary | ICD-10-CM | POA: Diagnosis not present

## 2019-08-16 DIAGNOSIS — M80031D Age-related osteoporosis with current pathological fracture, right forearm, subsequent encounter for fracture with routine healing: Secondary | ICD-10-CM | POA: Diagnosis not present

## 2019-08-16 DIAGNOSIS — K5909 Other constipation: Secondary | ICD-10-CM | POA: Diagnosis not present

## 2019-08-16 DIAGNOSIS — K2270 Barrett's esophagus without dysplasia: Secondary | ICD-10-CM | POA: Diagnosis not present

## 2019-08-16 DIAGNOSIS — F319 Bipolar disorder, unspecified: Secondary | ICD-10-CM | POA: Diagnosis not present

## 2019-08-16 DIAGNOSIS — K222 Esophageal obstruction: Secondary | ICD-10-CM | POA: Diagnosis not present

## 2019-08-16 DIAGNOSIS — D509 Iron deficiency anemia, unspecified: Secondary | ICD-10-CM | POA: Diagnosis not present

## 2019-08-16 DIAGNOSIS — M80821D Other osteoporosis with current pathological fracture, right humerus, subsequent encounter for fracture with routine healing: Secondary | ICD-10-CM | POA: Diagnosis not present

## 2019-08-16 DIAGNOSIS — J449 Chronic obstructive pulmonary disease, unspecified: Secondary | ICD-10-CM | POA: Diagnosis not present

## 2019-08-16 DIAGNOSIS — F411 Generalized anxiety disorder: Secondary | ICD-10-CM | POA: Diagnosis not present

## 2019-08-17 ENCOUNTER — Other Ambulatory Visit: Payer: Self-pay

## 2019-08-17 ENCOUNTER — Telehealth: Payer: Self-pay | Admitting: Psychiatry

## 2019-08-17 ENCOUNTER — Telehealth: Payer: Self-pay | Admitting: Family Medicine

## 2019-08-17 ENCOUNTER — Encounter: Payer: Self-pay | Admitting: Family Medicine

## 2019-08-17 DIAGNOSIS — M80821D Other osteoporosis with current pathological fracture, right humerus, subsequent encounter for fracture with routine healing: Secondary | ICD-10-CM | POA: Diagnosis not present

## 2019-08-17 DIAGNOSIS — F5105 Insomnia due to other mental disorder: Secondary | ICD-10-CM

## 2019-08-17 DIAGNOSIS — K2270 Barrett's esophagus without dysplasia: Secondary | ICD-10-CM | POA: Diagnosis not present

## 2019-08-17 DIAGNOSIS — K5909 Other constipation: Secondary | ICD-10-CM | POA: Diagnosis not present

## 2019-08-17 DIAGNOSIS — J449 Chronic obstructive pulmonary disease, unspecified: Secondary | ICD-10-CM | POA: Diagnosis not present

## 2019-08-17 DIAGNOSIS — F319 Bipolar disorder, unspecified: Secondary | ICD-10-CM | POA: Diagnosis not present

## 2019-08-17 DIAGNOSIS — D509 Iron deficiency anemia, unspecified: Secondary | ICD-10-CM | POA: Diagnosis not present

## 2019-08-17 DIAGNOSIS — K222 Esophageal obstruction: Secondary | ICD-10-CM | POA: Diagnosis not present

## 2019-08-17 DIAGNOSIS — M80031D Age-related osteoporosis with current pathological fracture, right forearm, subsequent encounter for fracture with routine healing: Secondary | ICD-10-CM | POA: Diagnosis not present

## 2019-08-17 DIAGNOSIS — F411 Generalized anxiety disorder: Secondary | ICD-10-CM | POA: Diagnosis not present

## 2019-08-17 MED ORDER — DEXILANT 60 MG PO CPDR: 60.0000 mg | DELAYED_RELEASE_CAPSULE | Freq: Every day | ORAL | 6 refills | Status: DC

## 2019-08-17 MED ORDER — DAYVIGO 5 MG PO TABS: 5.0000 mg | ORAL_TABLET | Freq: Every day | ORAL | 1 refills | Status: DC

## 2019-08-17 NOTE — Telephone Encounter (Signed)
Pt called to report Dayvigo (lemborexant 5 mg) is working well and would like a Rx sent to [facility] on file.  May need PA and doesn't want to run out before approved.

## 2019-08-17 NOTE — Telephone Encounter (Addendum)
Ok - I have sent dexilant to take 60mg  once daily in place of protonix as it works better. Rx sent to [facility]  Will forward to [facility] to see if we can call and schedule GI f/u (Nand[place]am).  Thank you

## 2019-08-17 NOTE — Addendum Note (Signed)
Addended by: Sharyl Nimrod on: 08/17/2019 05:00 PM   Modules accepted: Orders

## 2019-08-17 NOTE — Telephone Encounter (Signed)
Script sent  

## 2019-08-17 NOTE — Telephone Encounter (Signed)
GI Appointment scheduled with Ellouise Newer P.A. on 09/12/2019 at 2:15pm arrival time. Sending Referral letter as voicemail is full.

## 2019-08-17 NOTE — Telephone Encounter (Signed)
plz call patient for update - how is weight doing? (get updated weight). Is she taking her protonix 40mg  bid? It seems she may need refill - so I have sent in for her.  At hospital d/c was recommended GI f/u this month with Dr Silverio Decamp - would she like [place] to help schedule? I know she's had trouble with cost in the past.  I saw she recently saw ortho and psych, continued seroquel 800mg  nightly. How has she been doing overall?  Wt Readings from Last 3 Encounters:  08/14/19 111 lb (50.3 kg)  07/20/19 116 lb 2.9 oz (52.7 kg)  07/13/19 111 lb 1 oz (50.4 kg)

## 2019-08-17 NOTE — Addendum Note (Signed)
Addended by: Ria Bush on: 08/17/2019 11:54 AM   Modules accepted: Orders

## 2019-08-17 NOTE — Telephone Encounter (Signed)
Spoke with pt asking for update.  Says she feels much better.  States she is up 3 lbs, last wt was on 08/14/19 at psych office.  Says she is taking the Protonix BID but asks if she can go back to Dexilant.  Knows it's more expensive but says it works better.  Pt does request help with scheduling GI f/u.  Says she did not know anything about that.  Confirms she is taking Seroquel 800 mg nightly along with Dayvigo 5 mg h.s.  Fyi to Dr. Darnell Level.

## 2019-08-18 NOTE — Telephone Encounter (Signed)
Per Rosaria Ferries, pt was informed of Dr. Synthia Innocent message and details concerning GI referral.

## 2019-08-21 ENCOUNTER — Telehealth: Payer: Self-pay | Admitting: Psychiatry

## 2019-08-21 DIAGNOSIS — F5105 Insomnia due to other mental disorder: Secondary | ICD-10-CM

## 2019-08-21 MED ORDER — BELSOMRA 15 MG PO TABS: 15.0000 mg | ORAL_TABLET | Freq: Every day | ORAL | 2 refills | Status: DC

## 2019-08-21 NOTE — Telephone Encounter (Signed)
Patient aware and advised to call back if not effective

## 2019-08-28 DIAGNOSIS — D509 Iron deficiency anemia, unspecified: Secondary | ICD-10-CM | POA: Diagnosis not present

## 2019-08-28 DIAGNOSIS — K222 Esophageal obstruction: Secondary | ICD-10-CM | POA: Diagnosis not present

## 2019-08-28 DIAGNOSIS — M80031D Age-related osteoporosis with current pathological fracture, right forearm, subsequent encounter for fracture with routine healing: Secondary | ICD-10-CM | POA: Diagnosis not present

## 2019-08-28 DIAGNOSIS — F319 Bipolar disorder, unspecified: Secondary | ICD-10-CM | POA: Diagnosis not present

## 2019-08-28 DIAGNOSIS — J449 Chronic obstructive pulmonary disease, unspecified: Secondary | ICD-10-CM | POA: Diagnosis not present

## 2019-08-28 DIAGNOSIS — M80821D Other osteoporosis with current pathological fracture, right humerus, subsequent encounter for fracture with routine healing: Secondary | ICD-10-CM | POA: Diagnosis not present

## 2019-08-28 DIAGNOSIS — K5909 Other constipation: Secondary | ICD-10-CM | POA: Diagnosis not present

## 2019-08-28 DIAGNOSIS — F411 Generalized anxiety disorder: Secondary | ICD-10-CM | POA: Diagnosis not present

## 2019-08-28 DIAGNOSIS — K2270 Barrett's esophagus without dysplasia: Secondary | ICD-10-CM | POA: Diagnosis not present

## 2019-09-05 ENCOUNTER — Other Ambulatory Visit: Payer: Self-pay | Admitting: Psychiatry

## 2019-09-05 DIAGNOSIS — F5105 Insomnia due to other mental disorder: Secondary | ICD-10-CM

## 2019-09-05 DIAGNOSIS — F319 Bipolar disorder, unspecified: Secondary | ICD-10-CM

## 2019-09-06 DIAGNOSIS — R69 Illness, unspecified: Secondary | ICD-10-CM | POA: Diagnosis not present

## 2019-09-07 ENCOUNTER — Telehealth: Payer: Self-pay | Admitting: Psychiatry

## 2019-09-07 NOTE — Telephone Encounter (Signed)
Pt called and said she is experiencing more depression. She is out of control. Did spend more money over the holidays.

## 2019-09-08 DIAGNOSIS — J449 Chronic obstructive pulmonary disease, unspecified: Secondary | ICD-10-CM | POA: Diagnosis not present

## 2019-09-08 DIAGNOSIS — D509 Iron deficiency anemia, unspecified: Secondary | ICD-10-CM | POA: Diagnosis not present

## 2019-09-08 DIAGNOSIS — K2270 Barrett's esophagus without dysplasia: Secondary | ICD-10-CM | POA: Diagnosis not present

## 2019-09-08 DIAGNOSIS — F411 Generalized anxiety disorder: Secondary | ICD-10-CM | POA: Diagnosis not present

## 2019-09-08 DIAGNOSIS — M80031D Age-related osteoporosis with current pathological fracture, right forearm, subsequent encounter for fracture with routine healing: Secondary | ICD-10-CM | POA: Diagnosis not present

## 2019-09-08 DIAGNOSIS — K222 Esophageal obstruction: Secondary | ICD-10-CM | POA: Diagnosis not present

## 2019-09-08 DIAGNOSIS — F319 Bipolar disorder, unspecified: Secondary | ICD-10-CM | POA: Diagnosis not present

## 2019-09-08 DIAGNOSIS — M80821D Other osteoporosis with current pathological fracture, right humerus, subsequent encounter for fracture with routine healing: Secondary | ICD-10-CM | POA: Diagnosis not present

## 2019-09-08 DIAGNOSIS — K5909 Other constipation: Secondary | ICD-10-CM | POA: Diagnosis not present

## 2019-09-08 NOTE — Telephone Encounter (Signed)
Pt. Made aware.

## 2019-09-11 ENCOUNTER — Encounter: Payer: Self-pay | Admitting: *Deleted

## 2019-09-12 ENCOUNTER — Other Ambulatory Visit: Payer: Self-pay | Admitting: Family Medicine

## 2019-09-12 ENCOUNTER — Ambulatory Visit (INDEPENDENT_AMBULATORY_CARE_PROVIDER_SITE_OTHER): Payer: Medicare HMO | Admitting: Psychiatry

## 2019-09-12 ENCOUNTER — Ambulatory Visit: Payer: Medicare HMO | Admitting: Physician Assistant

## 2019-09-12 ENCOUNTER — Encounter: Payer: Self-pay | Admitting: Psychiatry

## 2019-09-12 DIAGNOSIS — F319 Bipolar disorder, unspecified: Secondary | ICD-10-CM

## 2019-09-12 DIAGNOSIS — M25511 Pain in right shoulder: Secondary | ICD-10-CM | POA: Diagnosis not present

## 2019-09-12 DIAGNOSIS — F3130 Bipolar disorder, current episode depressed, mild or moderate severity, unspecified: Secondary | ICD-10-CM | POA: Diagnosis not present

## 2019-09-12 DIAGNOSIS — S52531K Colles' fracture of right radius, subsequent encounter for closed fracture with nonunion: Secondary | ICD-10-CM | POA: Diagnosis not present

## 2019-09-12 DIAGNOSIS — F5105 Insomnia due to other mental disorder: Secondary | ICD-10-CM | POA: Diagnosis not present

## 2019-09-12 MED ORDER — BELSOMRA 15 MG PO TABS: 15.0000 mg | ORAL_TABLET | Freq: Every day | ORAL | 0 refills | Status: DC

## 2019-09-12 MED ORDER — BUPROPION HCL ER (XL) 150 MG PO TB24: 150.0000 mg | ORAL_TABLET | Freq: Every day | ORAL | 0 refills | Status: DC

## 2019-09-12 MED ORDER — QUETIAPINE FUMARATE 200 MG PO TABS: ORAL_TABLET | ORAL | 1 refills | Status: DC

## 2019-09-12 NOTE — Progress Notes (Signed)
Robin Welch [id number] [birth year] 54 y.o.  Virtual Visit via Telephone Note  I connected with pt on 09/13/19 at  1:00 PM EST by telephone and verified that I am speaking with the correct person using two identifiers.   I discussed the limitations, risks, security and privacy concerns of performing an evaluation and management service by telephone and the availability of in person appointments. I also discussed with the patient that there may be a patient responsible charge related to this service. The patient expressed understanding and agreed to proceed.   I discussed the assessment and treatment plan with the patient. The patient was provided an opportunity to ask questions and all were answered. The patient agreed with the plan and demonstrated an understanding of the instructions.   The patient was advised to call back or seek an in-person evaluation if the symptoms worsen or if the condition fails to improve as anticipated.  I provided 25 minutes of non-face-to-face time during this encounter.  The patient was located at home.  The provider was located at [place].   Thayer Headings, PMHNP   Subjective:   Patient ID:  Robin Welch is a 54 y.o. (DOB 1965/12/02) female.  Chief Complaint:  Chief Complaint  Patient presents with  . Depression  . Anxiety  . Insomnia    HPI Robin Welch presents for follow-up of depression, anxiety, and insomnia. She reports that she had some situational depression for several days in response to COVID, isolation, lengthy divorce process, and holidays. She reports that her mood spontaneously improved after several days. She denies current depression. She reports that her worry also improved. Denies any current worry. Denies panic attacks.   She reports that she has difficulty getting up and getting going in the morning and that energy and motivation is low at that time. She reports that she has to push herself to do things, and if she does not  get going early energy and motivation remains low. Reports increasing Seroquel to 1000 mg po QHS when Dayvigo was denied. She reports that her sleep has improved with higher amount of Seroquel. 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 Sleeping 8 hours a night. She reports that her appetite has been good, however she has been unable to keep food down. Denies memory and concentration difficulties. Denies SI.   She reports that her physical health is improving. She reports that she is setting personal goals for herself for the first time in years. Looking for employment.   Reports that she had one seizure in her 30's when she over-took headache medicine.   Past Psychiatric Medication Trials: Xanax- Misused Klonopin Seroquel- Helpful for sleep and anxiety.  Olanzapine Geodon Abilify Lithium Lamictal Depakote Buspar Hydroxyzine Gabapentin Prozac- Hives, edema Celexa- Possible allergic reaction Paxil Wellbutrin XL Sonata Ambien Zolpimist Lunesta Trazodone  Review of Systems:  Review of Systems  HENT: Positive for dental problem.   Gastrointestinal: Positive for nausea and vomiting.  Musculoskeletal: Negative for gait problem.  Neurological: Negative for tremors.  Psychiatric/Behavioral:       Please refer to HPI    Medications: I have reviewed the patient's current medications.  Current Outpatient Medications  Medication Sig Dispense Refill  . QUEtiapine (SEROQUEL) 200 MG tablet TAKE 4 TABLETS(800 MG) BY MOUTH AT BEDTIME 120 tablet 1  . sucralfate (CARAFATE) 1 GM/10ML suspension Take 10 mLs (1 g total) by mouth 4 (four) times daily. 1260 mL 3  . albuterol (VENTOLIN HFA) 108 (90  Base) MCG/ACT inhaler INHALE 2 PUFFS BY MOUTH EVERY 6 HOURS AS NEEDED FOR WHEEZING OR SHORTNESS OF BREATH 54 g 0  . buPROPion (WELLBUTRIN XL) 150 MG 24 hr tablet Take 1 tablet (150 mg total) by mouth daily. 30 tablet 0  . calcium carbonate (CALCIUM 600) 600 MG TABS tablet Take  1 tablet (600 mg total) by mouth daily with breakfast. 3 tablet   . Cholecalciferol (VITAMIN D) 50 MCG (2000 UT) CAPS Take 1 capsule (2,000 Units total) by mouth daily.    Marland Kitchen dexlansoprazole (DEXILANT) 60 MG capsule Take 1 capsule (60 mg total) by mouth daily. 30 capsule 6  . methocarbamol (ROBAXIN) 500 MG tablet Take 1 tablet (500 mg total) by mouth every 8 (eight) hours as needed for muscle spasms. 30 tablet 0  . nicotine (NICODERM CQ - DOSED IN MG/24 HOURS) 21 mg/24hr patch Place 1 patch (21 mg total) onto the skin daily. (Patient not taking: Reported on 08/14/2019) 28 patch 0  . ondansetron (ZOFRAN-ODT) 4 MG disintegrating tablet Take 1 tablet (4 mg total) by mouth every 8 (eight) hours as needed for nausea or vomiting. 20 tablet 2  . oxyCODONE (OXY IR/ROXICODONE) 5 MG immediate release tablet 1 po q 4-6hrs prn pain (Patient not taking: Reported on 08/14/2019) 40 tablet 0  . Suvorexant (BELSOMRA) 15 MG TABS Take 15 mg by mouth at bedtime. 90 tablet 0  . Tiotropium Bromide Monohydrate (SPIRIVA RESPIMAT) 2.5 MCG/ACT AERS Inhale 1 puff into the lungs daily. 4 g 6   No current facility-administered medications for this visit.    Medication Side Effects: None  Allergies:  Allergies  Allergen Reactions  . Prozac [Fluoxetine Hcl] Swelling and Rash  . Doxycycline Nausea And Vomiting  . Celexa [Citalopram Hydrobromide] Other (See Comments)    Possible allergic reaction  . Chantix [Varenicline Tartrate] Swelling    Mouth swelling  . Hydroxyzine Other (See Comments)    Not effective  . Klonopin [Clonazepam] Other (See Comments)    Caused instablility. Not able to drive due to over medicated  . Lunesta [Eszopiclone] Other (See Comments)    Not effective  . Prozac [Fluoxetine Hcl] Swelling  . Sonata [Zaleplon] Other (See Comments)    Not effective  . Trazodone And Nefazodone Other (See Comments)    ineffective  . Zolpimist [Zolpidem Tartrate] Other (See Comments)    Not effective  .  Azithromycin Other (See Comments)    Reaction not recalled by the patient    Past Medical History:  Diagnosis Date  . Anemia   . Anxiety   . Barrett esophagus   . Bipolar 1 disorder [facility])    sees Dr. Candis Schatz psych [phone])  . Closed fracture distal radius and ulna, left, initial encounter 04/20/2019   S/p ORIF 04/2019 [facility])   . COPD (chronic obstructive pulmonary disease) ([place]) 03/2013   hyperinflation by CXR  . Elevated alkaline phosphatase level   . Esophagitis   . Hiatal hernia   . History of cocaine dependence ([place]) latest 2020   undergoing detox  . History of stomach ulcers   . Hx: UTI (urinary tract infection)   . Migraine    "stopped in ~ 2008" (03/11/2017)  . Muscle strain of gluteal region, right, subsequent encounter 10/05/2018   By MRI 2020  . Osteoporosis 06/09/2018   DEXA 05/2017 - T score -2.5 L hip, -1.3 spine  . Positive PPD 1989   s/p CXR and INH 6 mo  . Seizure ([place]) X 1   "  from takiing too many headache medicine?" (03/11/2017)  . Smoker     Family History  Problem Relation Age of Onset  . Diabetes Mother        T2DM  . Breast cancer Mother 62  . Dementia Mother   . Lymphoma Mother   . Coronary artery disease Father 48       MI  . Anxiety disorder Paternal Aunt   . Anxiety disorder Cousin   . Depression Cousin   . ADD / ADHD Child   . Anxiety disorder Child   . Depression Child   . Stroke Neg Hx   . Colon cancer Neg Hx   . Cancer - Colon Neg Hx     Social History   Socioeconomic History  . Marital status: Divorced    Spouse name: Not on file  . Number of children: 2  . Years of education: bachelors  . Highest education level: Not on file  Occupational History  . 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 Smoking status: Current Every Day Smoker    Packs/day: 1.00    Years: 36.00    Pack years: 36.00    Types: Cigarettes  . Smokeless  tobacco: Never Used  Substance and Sexual Activity  . Alcohol use: Not Currently    Alcohol/week: 0.0 standard drinks    Comment: 03/11/2017 "stopped 09/18/2015; never an alcoholic"  . Drug use: Yes    Types: Cocaine    Comment: not currently  . Sexual activity: Never    Birth control/protection: Surgical  Other Topics Concern  . Not on file  Social History Narrative   Caffeine: 40 oz diet mountain dew/day   Lives with husband and 2 children, no pets      Social Determinants of Health   Financial Resource Strain:   . Difficulty of Paying Living Expenses: Not on file  Food Insecurity:   . Worried About Charity fundraiser in the Last Year: Not on file  . Ran Out of Food in the Last Year: Not on file  Transportation Needs:   . Lack of Transportation (Medical): Not on file  . Lack of Transportation (Non-Medical): Not on file  Physical Activity:   . Days of Exercise per Week: Not on file  . Minutes of Exercise per Session: Not on file  Stress:   . Feeling of Stress : Not on file  Social Connections:   . Frequency of Communication with Friends and Family: Not on file  . Frequency of Social Gatherings with Friends and Family: Not on file  . Attends Religious Services: Not on file  . Active Member of Clubs or Organizations: Not on file  . Attends Archivist Meetings: Not on file  . Marital Status: Not on file  Intimate Partner Violence:   . Fear of Current or Ex-Partner: Not on file  . Emotionally Abused: Not on file  . Physically Abused: Not on file  . Sexually Abused: Not on file    Past Medical History, Surgical history, Social history, and Family history were reviewed and updated as appropriate.   Please see review of systems for further details on the patient's review from today.   Objective:   Physical Exam:  LMP 04/23/2015 (Approximate)   Physical Exam Neurological:     Mental Status: She is alert and oriented to person, place, and time.  Cranial  Nerves: No dysarthria.  Psychiatric:        Attention and Perception: Attention and perception normal.        Mood and Affect: Mood is depressed.        Speech: Speech normal.        Behavior: Behavior is cooperative.        Thought Content: Thought content normal. Thought content is not paranoid or delusional. Thought content does not include homicidal or suicidal ideation. Thought content does not include homicidal or suicidal plan.        Cognition and Memory: Cognition and memory normal.        Judgment: Judgment normal.     Comments: Insight intact     Lab Review:     Component Value Date/Time   NA 135 07/20/2019 0311   K 3.9 07/20/2019 0311   CL 106 07/20/2019 0311   CO2 22 07/20/2019 0311   GLUCOSE 82 07/20/2019 0311   BUN <5 (L) 07/20/2019 0311   CREATININE 0.35 (L) 07/20/2019 0311   CALCIUM 7.6 (L) 07/20/2019 0311   PROT 3.8 (L) 07/20/2019 0311   ALBUMIN 1.3 (L) 07/20/2019 0311   AST 15 07/20/2019 0311   ALT 23 07/20/2019 0311   ALKPHOS 202 (H) 07/20/2019 0311   BILITOT 0.6 07/20/2019 0311   GFRNONAA >60 07/20/2019 0311   GFRAA >60 07/20/2019 0311       Component Value Date/Time   WBC 8.1 07/20/2019 0311   RBC 2.69 (L) 07/20/2019 0311   HGB 8.3 (L) 07/20/2019 0843   HGB 10.8 (L) 07/11/2018 1050   HCT 27.1 (L) 07/20/2019 0843   PLT 571 (H) 07/20/2019 0311   PLT 547 (H) 07/11/2018 1050   MCV 96.3 07/20/2019 0311   MCH 29.0 07/20/2019 0311   MCHC 30.1 07/20/2019 0311   RDW 16.6 (H) 07/20/2019 0311   LYMPHSABS 1.4 07/13/2019 1236   MONOABS 0.6 07/13/2019 1236   EOSABS 1.3 (H) 07/13/2019 1236   BASOSABS 0.1 07/13/2019 1236    L[place]ium Lvl  Date Value Ref Range Status  12/27/2010 1.43 (H) 0.80 - 1.40 mEq/L Final     No results found for: PHENYTOIN, PHENOBARB, VALPROATE, CBMZ   .res Assessment: Plan:   Pt seen for 30 minutes and time spent discussing medication options. Discussed that risk of QT prolongation is increased with higher doses of Seroquel and  that doses above 800 mg are not approved by the FDA. Will decrease Seroquel to 800 mg due to risk of QT prolongation.  Discussed potential benefits, risks, and side effects of Wellbutrin with pt to improve energy and motivation. Pt agrees to re-trial of Wellbutrin XL. Will start Wellbutrin XL 150 mg po q am for depression. Will send script for Belsomra to [facility] to determine if cost is more affordable through [place].  Discussed potential benefits of therapy and referral made to Sammuel Cooper, LCSW for therapy.  Pt to f/u in 4 weeks or sooner if clinically indicated.  Patient advised to contact office with any questions, adverse effects, or acute worsening in signs and symptoms.  Giavonna was seen today for depression, anxiety and insomnia.  Diagnoses and all orders for this visit:  Bipolar I disorder, most recent episode depressed (HCC) -     buPROPion (WELLBUTRIN XL) 150 MG 24 hr tablet; Take 1 tablet (150 mg total) by mouth daily.  Insomnia due to mental disorder -     Discontinue: Suvorexant (BELSOMRA) 15 MG TABS; Take 15 mg by mouth  at bedtime. -     QUEtiapine (SEROQUEL) 200 MG tablet; TAKE 4 TABLETS(800 MG) BY MOUTH AT BEDTIME -     Suvorexant (BELSOMRA) 15 MG TABS; Take 15 mg by mouth at bedtime.  Bipolar I disorder (HCC) -     QUEtiapine (SEROQUEL) 200 MG tablet; TAKE 4 TABLETS(800 MG) BY MOUTH AT BEDTIME    Please see After Visit Summary for patient specific instructions.  Future Appointments  Date Time Provider [place]  09/14/2019  9:30 AM Zehr, Laban Emperor, PA-C [facility]  09/19/2019 10:00 AM Barnie Del, LCSW [facility] None    No orders of the defined types were placed in this encounter.     -------------------------------

## 2019-09-14 ENCOUNTER — Ambulatory Visit: Payer: Medicare HMO | Admitting: Gastroenterology

## 2019-09-14 ENCOUNTER — Encounter: Payer: Self-pay | Admitting: Gastroenterology

## 2019-09-14 VITALS — BP 80/56 | HR 69 | Temp 97.6°F | Ht 62.0 in | Wt 109.0 lb

## 2019-09-14 DIAGNOSIS — K222 Esophageal obstruction: Secondary | ICD-10-CM | POA: Diagnosis not present

## 2019-09-14 DIAGNOSIS — K2101 Gastro-esophageal reflux disease with esophagitis, with bleeding: Secondary | ICD-10-CM | POA: Insufficient documentation

## 2019-09-14 DIAGNOSIS — F411 Generalized anxiety disorder: Secondary | ICD-10-CM | POA: Diagnosis not present

## 2019-09-14 DIAGNOSIS — R1310 Dysphagia, unspecified: Secondary | ICD-10-CM

## 2019-09-14 DIAGNOSIS — M80031D Age-related osteoporosis with current pathological fracture, right forearm, subsequent encounter for fracture with routine healing: Secondary | ICD-10-CM | POA: Diagnosis not present

## 2019-09-14 DIAGNOSIS — F319 Bipolar disorder, unspecified: Secondary | ICD-10-CM | POA: Diagnosis not present

## 2019-09-14 DIAGNOSIS — J449 Chronic obstructive pulmonary disease, unspecified: Secondary | ICD-10-CM | POA: Diagnosis not present

## 2019-09-14 DIAGNOSIS — K5909 Other constipation: Secondary | ICD-10-CM | POA: Diagnosis not present

## 2019-09-14 DIAGNOSIS — R1319 Other dysphagia: Secondary | ICD-10-CM | POA: Insufficient documentation

## 2019-09-14 DIAGNOSIS — D509 Iron deficiency anemia, unspecified: Secondary | ICD-10-CM | POA: Diagnosis not present

## 2019-09-14 DIAGNOSIS — M80821D Other osteoporosis with current pathological fracture, right humerus, subsequent encounter for fracture with routine healing: Secondary | ICD-10-CM | POA: Diagnosis not present

## 2019-09-14 DIAGNOSIS — K2270 Barrett's esophagus without dysplasia: Secondary | ICD-10-CM | POA: Diagnosis not present

## 2019-09-14 MED ORDER — SUCRALFATE 1 GM/10ML PO SUSP: 1.0000 g | Freq: Three times a day (TID) | ORAL | 1 refills | Status: DC

## 2019-09-14 MED ORDER — PANTOPRAZOLE SODIUM 40 MG PO TBEC: 40.0000 mg | DELAYED_RELEASE_TABLET | Freq: Two times a day (BID) | ORAL | 5 refills | Status: DC

## 2019-09-14 NOTE — Patient Instructions (Signed)
You have been scheduled for an endoscopy. Please follow written instructions given to you at your visit today. If you use inhalers (even only as needed), please bring them with you on the day of your procedure. Your physician has requested that you go to www.[email or web address].com and enter the access code given to you at your visit today. This web site gives a general overview about your procedure. However, you should still follow specific instructions given to you by our office regarding your preparation for the procedure.  We have sent the following medications to your pharmacy for you to pick up at your convenience: Pantoprazole 40 mg twice daily  carafate before meals and at bedtime  If you are age 52 or older, your body mass index should be between 23-30. Your Body mass index is 19.94 kg/m. If this is out of the aforementioned range listed, please consider follow up with your Primary Care Provider.  If you are age 30 or younger, your body mass index should be between 19-25. Your Body mass index is 19.94 kg/m. If this is out of the aformentioned range listed, please consider follow up with your Primary Care Provider.   Due to recent changes in healthcare laws, you may see the results of your imaging and laboratory studies on MyChart before your provider has had a chance to review them.  We understand that in some cases there may be results that are confusing or concerning to you. Not all laboratory results come back in the same time frame and the provider may be waiting for multiple results in order to interpret others.  Please give [place] 48 hours in order for your provider to thoroughly review all the results before contacting the office for clarification of your results.
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
## 2019-09-18 ENCOUNTER — Other Ambulatory Visit: Payer: Self-pay | Admitting: *Deleted

## 2019-09-18 ENCOUNTER — Telehealth: Payer: Self-pay | Admitting: Psychiatry

## 2019-09-18 DIAGNOSIS — Z87891 Personal history of nicotine dependence: Secondary | ICD-10-CM

## 2019-09-18 DIAGNOSIS — F17210 Nicotine dependence, cigarettes, uncomplicated: Secondary | ICD-10-CM

## 2019-09-18 NOTE — Telephone Encounter (Signed)
Yaribel called and said the mail order hasn't filled her Belsomra yet because they some information from [place].  So please send in a local supply until the mail order sends her medicine.  Send to [facility] on [address]

## 2019-09-19 ENCOUNTER — Other Ambulatory Visit: Payer: Self-pay

## 2019-09-19 ENCOUNTER — Ambulatory Visit (INDEPENDENT_AMBULATORY_CARE_PROVIDER_SITE_OTHER): Payer: Medicare HMO | Admitting: Addiction (Substance Use Disorder)

## 2019-09-19 DIAGNOSIS — F1590 Other stimulant use, unspecified, uncomplicated: Secondary | ICD-10-CM | POA: Diagnosis not present

## 2019-09-19 DIAGNOSIS — F3130 Bipolar disorder, current episode depressed, mild or moderate severity, unspecified: Secondary | ICD-10-CM

## 2019-09-19 NOTE — Progress Notes (Signed)
Reviewed and agree with documentation and assessment and plan. K. Veena Jackie Littlejohn , MD   
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
## 2019-09-25 ENCOUNTER — Other Ambulatory Visit: Payer: Self-pay | Admitting: Gastroenterology

## 2019-09-25 ENCOUNTER — Other Ambulatory Visit: Payer: Self-pay

## 2019-09-25 ENCOUNTER — Ambulatory Visit (INDEPENDENT_AMBULATORY_CARE_PROVIDER_SITE_OTHER): Payer: Medicare HMO | Admitting: Addiction (Substance Use Disorder)

## 2019-09-25 ENCOUNTER — Ambulatory Visit (INDEPENDENT_AMBULATORY_CARE_PROVIDER_SITE_OTHER): Payer: Medicare HMO

## 2019-09-25 DIAGNOSIS — F3130 Bipolar disorder, current episode depressed, mild or moderate severity, unspecified: Secondary | ICD-10-CM | POA: Diagnosis not present

## 2019-09-25 DIAGNOSIS — Z1159 Encounter for screening for other viral diseases: Secondary | ICD-10-CM

## 2019-09-25 DIAGNOSIS — F411 Generalized anxiety disorder: Secondary | ICD-10-CM | POA: Diagnosis not present
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
## 2019-09-26 LAB — SARS CORONAVIRUS 2 (TAT 6-24 HRS): SARS Coronavirus 2: NEGATIVE

## 2019-09-27 ENCOUNTER — Other Ambulatory Visit: Payer: Self-pay

## 2019-09-27 ENCOUNTER — Ambulatory Visit (INDEPENDENT_AMBULATORY_CARE_PROVIDER_SITE_OTHER): Payer: Medicare HMO | Admitting: Family Medicine

## 2019-09-27 ENCOUNTER — Encounter: Payer: Self-pay | Admitting: Family Medicine

## 2019-09-27 VITALS — Temp 99.5°F | Ht 62.0 in | Wt 110.0 lb

## 2019-09-27 DIAGNOSIS — R1084 Generalized abdominal pain: Secondary | ICD-10-CM | POA: Diagnosis not present

## 2019-09-27 NOTE — Progress Notes (Signed)
Virtual Visit via Video Note  I connected with [name] on 09/27/19 at  2:00 PM EST by a video enabled telemedicine application and verified that I am speaking with the correct person using two identifiers.  We were unable to achieve adequate Internet connection for video capability and completed the visit with audio only.  Location: Patient: In her home Provider: [place]  Persons participating in virtual visit: Patient and provider  I discussed the limitations of evaluation and management by telemedicine and the availability of in person appointments. The patient expressed understanding and agreed to proceed.  History of Present Illness: Chief Complaint  Patient presents with  . Abdominal Pain    Stomach pain x 2 days, severa pain unless laying completely still. Some low grade fever 99.5. Pt notes little vomiting with nausea - but states this is also a long standing symptoms for her so not sure if related to current abd pain. Pt denies diarrhea.   This is a 54 year old female, patient of Dr. Danise Mina.  She requests virtual visit today discussed above chief complaint.  The patient reports 2 days of generalized abdominal pain.  She reports that it started yesterday and is somewhat less severe today.  She notices it especially when she sits up from a lying position or moves.  She says that it "feels viral."  She denies any other body aches.  She had a temperature of 99.5 yesterday and today it was 99.1.  She had a recent negative Covid test in preparation for EGD in 2 days with Dr. Silverio Decamp.  The patient reports good p.o. intake with good urine output.  She ate chef boy Ardee spaghetti and meatballs yesterday.  She has been able to keep down other bland foods today.  She reports having multiple bowel movements yesterday and the day before that look like "balls."  She has not had a bowel movement today.  She has had some nausea but denies vomiting.  She is currently on pantoprazole 40 mg  twice a day and sucralfate suspension as needed.  She has not required any ondansetron.  She has not taken anything in addition for her symptoms.  She denies any recent unusual activity or exercise.    Observations/Objective: The patient is alert and answers questions appropriately.  She is normally conversive and I do not appreciate any increased work of breathing.  No audible wheeze or witnessed cough. Temp 99.5 F (37.5 C) (Oral)   Ht 5\' 2"  (1.575 m)   Wt 110 lb (49.9 kg)   LMP 04/23/2015 (Approximate)   BMI 20.12 kg/m   Assessment and Plan: 1. Generalized abdominal pain -This is improved since yesterday.  Given that she is not having any difficulty with oral hydration, and she has had recent bowel movements, I think she can be maintained at home. -Suggested that she try acetaminophen 2 tablets every 8-12 hours for pain as well as heating pad.  Continue bland diet. -Reviewed follow-up/ER precautions -Follow-up with GI as scheduled 09/28/2019 for EGD   Clarene Reamer, FNP-BC  [facility]  09/27/2019 2:25 PM   Follow Up Instructions:    I discussed the assessment and treatment plan with the patient. The patient was provided an opportunity to ask questions and all were answered. The patient agreed with the plan and demonstrated an understanding of the instructions.   The patient was advised to call back or seek an in-person evaluation if the symptoms worsen or  if the condition fails to improve as anticipated.  I provided 7 minutes 26 seconds of non-face-to-face time during this encounter.   Elby Beck, FNP

## 2019-09-29 ENCOUNTER — Other Ambulatory Visit: Payer: Self-pay | Admitting: Gastroenterology

## 2019-09-29 ENCOUNTER — Other Ambulatory Visit (INDEPENDENT_AMBULATORY_CARE_PROVIDER_SITE_OTHER): Payer: Medicare HMO

## 2019-09-29 ENCOUNTER — Encounter: Payer: Self-pay | Admitting: Gastroenterology

## 2019-09-29 ENCOUNTER — Telehealth: Payer: Self-pay

## 2019-09-29 ENCOUNTER — Other Ambulatory Visit: Payer: Self-pay

## 2019-09-29 ENCOUNTER — Ambulatory Visit (AMBULATORY_SURGERY_CENTER): Payer: Medicare HMO | Admitting: Gastroenterology

## 2019-09-29 VITALS — BP 107/58 | HR 86 | Temp 97.3°F | Resp 15 | Ht 62.0 in | Wt 109.0 lb

## 2019-09-29 DIAGNOSIS — K2210 Ulcer of esophagus without bleeding: Secondary | ICD-10-CM | POA: Diagnosis not present

## 2019-09-29 DIAGNOSIS — K250 Acute gastric ulcer with hemorrhage: Secondary | ICD-10-CM | POA: Diagnosis not present

## 2019-09-29 DIAGNOSIS — K222 Esophageal obstruction: Secondary | ICD-10-CM

## 2019-09-29 DIAGNOSIS — R1319 Other dysphagia: Secondary | ICD-10-CM

## 2019-09-29 DIAGNOSIS — R1310 Dysphagia, unspecified: Secondary | ICD-10-CM | POA: Diagnosis not present

## 2019-09-29 HISTORY — PX: ESOPHAGOGASTRODUODENOSCOPY: SHX1529

## 2019-09-29 LAB — CBC WITH DIFFERENTIAL/PLATELET
Basophils Absolute: 0 10*3/uL (ref 0.0–0.1)
Basophils Relative: 0.3 % (ref 0.0–3.0)
Eosinophils Absolute: 0.6 10*3/uL (ref 0.0–0.7)
Eosinophils Relative: 7 % — ABNORMAL HIGH (ref 0.0–5.0)
HCT: 31.1 % — ABNORMAL LOW (ref 36.0–46.0)
Hemoglobin: 10.5 g/dL — ABNORMAL LOW (ref 12.0–15.0)
Lymphocytes Relative: 14.7 % (ref 12.0–46.0)
Lymphs Abs: 1.3 10*3/uL (ref 0.7–4.0)
MCHC: 33.7 g/dL (ref 30.0–36.0)
MCV: 88.8 fl (ref 78.0–100.0)
Monocytes Absolute: 0.7 10*3/uL (ref 0.1–1.0)
Monocytes Relative: 7.8 % (ref 3.0–12.0)
Neutro Abs: 6 10*3/uL (ref 1.4–7.7)
Neutrophils Relative %: 70.2 % (ref 43.0–77.0)
Platelets: 448 10*3/uL — ABNORMAL HIGH (ref 150.0–400.0)
RBC: 3.5 Mil/uL — ABNORMAL LOW (ref 3.87–5.11)
RDW: 14.4 % (ref 11.5–15.5)
WBC: 8.6 10*3/uL (ref 4.0–10.5)

## 2019-09-29 MED ORDER — ESOMEPRAZOLE MAGNESIUM 40 MG PO PACK: 40.0000 mg | PACK | Freq: Two times a day (BID) | ORAL | 12 refills | Status: DC

## 2019-09-29 MED ORDER — SODIUM CHLORIDE 0.9 % IV SOLN: 500.0000 mL | Freq: Once | INTRAVENOUS | Status: DC

## 2019-09-29 MED ORDER — SUCRALFATE 1 GM/10ML PO SUSP: 1.0000 g | Freq: Four times a day (QID) | ORAL | 1 refills | Status: DC

## 2019-09-29 MED ORDER — SUCRALFATE 1 GM/10ML PO SUSP: 1.0000 g | Freq: Four times a day (QID) | ORAL | 3 refills | Status: DC

## 2019-09-29 NOTE — Op Note (Addendum)
[facility] Patient Name: Robin Welch Procedure Date: 09/29/2019 8:08 AM MRN: [id number] Endoscopist: Mauri Pole , MD Age: 54 Referring MD:  Date of Birth: July 27, 1966 Gender: Female Account #: [id number] Procedure:                Upper GI endoscopy Indications:              Dysphagia Medicines:                Monitored Anesthesia Care Procedure:                Pre-Anesthesia Assessment:                           - Prior to the procedure, a History and Physical                            was performed, and patient medications and                            allergies were reviewed. The patient's tolerance of                            previous anesthesia was also reviewed. The risks                            and benefits of the procedure and the sedation                            options and risks were discussed with the patient.                            All questions were answered, and informed consent                            was obtained. Prior Anticoagulants: The patient has                            taken no previous anticoagulant or antiplatelet                            agents. ASA Grade Assessment: II - A patient with                            mild systemic disease. After reviewing the risks                            and benefits, the patient was deemed in                            satisfactory condition to undergo the procedure.                           After obtaining informed consent, the endoscope was  passed under direct vision. Throughout the                            procedure, the patient's blood pressure, pulse, and                            oxygen saturations were monitored continuously. The                            Endoscope was introduced through the mouth, and                            advanced to the lower third of esophagus. The upper                            GI endoscopy was technically difficult and  complex                            due to stenosis. The patient tolerated the                            procedure well. Scope In: Scope Out: Findings:                 Many cratered esophageal ulcers oozing blood were                            found 30 to 36 cm from the incisors with denuded                            necrotic mucosa and adherent clots in the lower                            esophagus, able to lavage the clots and suction, no                            visible mass lesion but given severeity of mucosal                            ulceration cannot exclude underlying neoplastic                            lesion..                           A few benign-appearing, intrinsic severe (stenosis;                            an endoscope cannot pass) stenoses were found 34 cm                            from the incisors. The narrowest stenosis measured  8 mm (inner diameter). The stenoses were not                            traversed. scope couldnt be extended beyong 35cm                            from incissors. Visible reflux of gastric contents                            into esophagus during procedure, able to suction                            completely. Complications:            No immediate complications. Estimated Blood Loss:     Estimated blood loss was minimal. Impression:               - Esophageal ulcers oozing blood.                           - Benign-appearing esophageal stenoses.                           - No specimens collected. Recommendation:           - Patient has a contact number available for                            emergencies. The signs and symptoms of potential                            delayed complications were discussed with the                            patient. Return to normal activities tomorrow.                            Written discharge instructions were provided to the                            patient.                            - Full liquid diet.                           - Continue present medications.                           - No aspirin, ibuprofen, naproxen, or other                            non-steroidal anti-inflammatory drugs.                           - Switch to Nexium (esomeprazole) packet 40 mg PO  BID.                           - Use sucralfate suspension 1 gram PO QID.                           - Repeat upper endoscopy in 2-4 weeks to check                            healing and esophageal biopsies with dilation if                            esophagitis improves. To be scheduled at [facility]                            endoscopy                           - Follow an antireflux regimen indefinitely.                           - Recheck CBC today Mauri Pole, MD 09/29/2019 8:30:25 AM This report has been signed electronically.

## 2019-09-29 NOTE — Patient Instructions (Signed)
Please read handouts provided. Full liquid diet. Continue present medications. No aspirin, ibuprofen, naproxen, or other non-steriodal anti-inflammatory drugs. Switch to Nexium ( esomeprazole ) packet 40 mg twice daily. Use sucralfate suspension 1 gram 4 times daily. Follow an anti-reflux regimen indefinitely. Repeat upper endoscopy in 2-4 weeks.         YOU HAD AN ENDOSCOPIC PROCEDURE TODAY AT [facility]:   Refer to the procedure report that was given to you for any specific questions about what was found during the examination.  If the procedure report does not answer your questions, please call your gastroenterologist to clarify.  If you requested that your care partner not be given the details of your procedure findings, then the procedure report has been included in a sealed envelope for you to review at your convenience later.  YOU SHOULD EXPECT: Some feelings of bloating in the abdomen. Passage of more gas than usual.  Walking can help get rid of the air that was put into your GI tract during the procedure and reduce the bloating. If you had a lower endoscopy (such as a colonoscopy or flexible sigmoidoscopy) you may notice spotting of blood in your stool or on the toilet paper. If you underwent a bowel prep for your procedure, you may not have a normal bowel movement for a few days.  Please Note:  You might notice some irritation and congestion in your nose or some drainage.  This is from the oxygen used during your procedure.  There is no need for concern and it should clear up in a day or so.  SYMPTOMS TO REPORT IMMEDIATELY:     Following upper endoscopy (EGD)  Vomiting of blood or coffee ground material  New chest pain or pain under the shoulder blades  Painful or persistently difficult swallowing  New shortness of breath  Fever of 100F or higher  Black, tarry-looking stools  For urgent or emergent issues, a gastroenterologist can be reached at any hour  by calling [phone].   DIET: Drink plenty of fluids but you should avoid alcoholic beverages for 24 hours.  ACTIVITY:  You should plan to take it easy for the rest of today and you should NOT DRIVE or use heavy machinery until tomorrow (because of the sedation medicines used during the test).    FOLLOW UP: Our staff will call the number listed on your records 48-72 hours following your procedure to check on you and address any questions or concerns that you may have regarding the information given to you following your procedure. If we do not reach you, we will leave a message.  We will attempt to reach you two times.  During this call, we will ask if you have developed any symptoms of COVID 19. If you develop any symptoms (ie: fever, flu-like symptoms, shortness of breath, cough etc.) before then, please call [phone].  If you test positive for Covid 19 in the 2 weeks post procedure, please call and report this information to [place].    If any biopsies were taken you will be contacted by phone or by letter within the next 1-3 weeks.  Please call us at [phone] if you have not heard about the biopsies in 3 weeks.    SIGNATURES/CONFIDENTIALITY: You and/or your care partner have signed paperwork which will be entered into your electronic medical record.  These signatures attest to the fact that that the information above on your After Visit Summary has been reviewed and is  understood.  Full responsibility of the confidentiality of this discharge information lies with you and/or your care-partner. 

## 2019-09-29 NOTE — Progress Notes (Signed)
Temp check by:JB Vital check by:DT  The medical and surgical history was reviewed and verified with the patient. 

## 2019-09-29 NOTE — Telephone Encounter (Signed)
I have started a prior authorization thru [place] for patients generic nexium packets.

## 2019-09-29 NOTE — Progress Notes (Signed)
PT taken to PACU. Monitors in place. VSS. Report given to RN. 

## 2019-10-02 ENCOUNTER — Telehealth: Payer: Self-pay | Admitting: Psychiatry

## 2019-10-02 ENCOUNTER — Other Ambulatory Visit: Payer: Self-pay

## 2019-10-02 ENCOUNTER — Ambulatory Visit (INDEPENDENT_AMBULATORY_CARE_PROVIDER_SITE_OTHER): Payer: Medicare HMO | Admitting: Addiction (Substance Use Disorder)

## 2019-10-02 ENCOUNTER — Encounter: Payer: Self-pay | Admitting: Addiction (Substance Use Disorder)

## 2019-10-02 DIAGNOSIS — F411 Generalized anxiety disorder: Secondary | ICD-10-CM | POA: Diagnosis not present

## 2019-10-02 DIAGNOSIS — F3130 Bipolar disorder, current episode depressed, mild or moderate severity, unspecified: Secondary | ICD-10-CM | POA: Diagnosis not present

## 2019-10-02 NOTE — Telephone Encounter (Signed)
Pt wants to know if she can get prescribed something else instead of Wellbutrin. She said that it is giving her really bad headaches. Please send to [facility] on [address]

## 2019-10-02 NOTE — Progress Notes (Signed)
[facility] Counselor/Therapist Progress Note  Patient ID: Robin Welch, MRN: [id number],    Date: 10/02/2019  Time Spent: 9:04-10:00 56 mins  Treatment Type: Individual Therapy  Reported Symptoms: hopelessness, anxiety, anger of not getting better.    Mental Status Exam:  Appearance:   Casual     Behavior:  Sharing  Motor:  Normal  Speech/Language:   Normal Rate  Affect:  Constricted  Mood:  angry, anxious and labile  Thought process:  flight of ideas  Thought content:    Rumination  Sensory/Perceptual disturbances:    WNL  Orientation:  x4  Attention:  Good  Concentration:  Good  Memory:  WNL  Fund of knowledge:   Good  Insight:    Good  Judgment:   Good  Impulse Control:  Fair   Risk Assessment: Danger to Self:  No Self-injurious Behavior: No Danger to Others: No Duty to Warn:no Physical Aggression / Violence:No  Access to Firearms a concern: No  Gang Involvement:No   Subjective: Client came in tearful today about her health care conditions and the suffering she feels related to her COPD and barretts disease. Client talked about her inability to follow their txt plan for her to only drink liquids for 4 weeks and she cried uncontrollably for 10 mins. Client and therapist worked on goal setting and discussing her relationship with food related to comfort and her anxiety about not healing her ulcers and esophogus. Client discussed her love of eating meat and things esp since stopping drinking/using and discussed how this is a trigger. Therapist used Mi & CBt to help client feel validated, have hope, and think of options for her to enjoy her liquid food like soup/etc. Therapist and client also used PST to discuss goals and recipes for her to try, creating a plan even down to the detail related to getting her son to help her cut veggies. Therapist worked with client to continue to discuss step 1 in relation to her SUD and also new news about having to change eating/food as  she knows it. Client processed through that and used some change talk with hope in her voice, making progress.  Interventions: Cognitive Behavioral Therapy, Motivational Interviewing, 12-Step and Solution-Oriented/Positive Psychology   Diagnosis:   ICD-10-CM   1. Bipolar I disorder, most recent episode depressed ([place])  F31.30   2. Generalized anxiety disorder  F41.1     Plan of Care: Client to return for weekly therapy with Sammuel Cooper, therapist, for outpatient therapy, to review again in 3-6 months. Client is to CONTINUE seeing medication provider for support of mood management, triggers and cravings.   Client to follow through with adding more support for them in their SUD txt: engaging in 12 step meetings. Client to engage in [place] txt and RPT relapse prevention therapy AEB coming to therapy weekly and implementing recovery oriented coping strategies to help reduce use of substances and to find relief from symptoms of MH disorders and trauma that cause them to want to escape from reality and numb their mind&body.  Client also to create more stability & structure AEB goal planning with therapist to assist in helping them get into a healthy rhythm/ schedule that can help to calm their nervous system and begin to build more healthy brain neuropathways.  Client to engage in CBT: challenging negative internal ruminationsand self-talk AEB expressing toxic thoughts and challenging them with truth.  Client to practice DBT distress tolerance skills (such as distress tolerance and emotion  regulation) to practice achieving wise mind and to build support for dealing with cravings AEB learning to ride the wave of emotion/sensation/etc instead of seeking negative self-soothing techniques: ie using substances to numb self.  Client to utilize BSP (brainspotting) with therapist to help client identify and process triggers for their MH/ SUD with goal of reducing SUDs by 33% each session. Client to prioritize  sleep 8+ hours each week night AEB going to bed by 10pm each night. Client participated in the treatment planning of their therapy. Client agreed with the plan and understands what to do if there is a crisis: call 9-1-1 and/or crisis line given by therapist.  Barnie Del, LCSW, LCAS, CCTP, CCS-I, BSP

## 2019-10-02 NOTE — Telephone Encounter (Signed)
Tried to reach patient to discuss, went to voicemail but mail box is full. Will try to reach later.

## 2019-10-02 NOTE — Telephone Encounter (Signed)
We are doing a prior auth for Nexium packets now, waiting on a response from Cover [employer] Meds

## 2019-10-02 NOTE — Telephone Encounter (Signed)
Pt requested prescription for Nexium capsules instead.  She stated that she can swallow capsules but not tablets.

## 2019-10-03 ENCOUNTER — Other Ambulatory Visit: Payer: Self-pay

## 2019-10-03 ENCOUNTER — Telehealth: Payer: Self-pay | Admitting: *Deleted

## 2019-10-03 ENCOUNTER — Telehealth: Payer: Self-pay

## 2019-10-03 DIAGNOSIS — K2100 Gastro-esophageal reflux disease with esophagitis, without bleeding: Secondary | ICD-10-CM

## 2019-10-03 DIAGNOSIS — K222 Esophageal obstruction: Secondary | ICD-10-CM

## 2019-10-03 DIAGNOSIS — R1310 Dysphagia, unspecified: Secondary | ICD-10-CM

## 2019-10-03 DIAGNOSIS — K920 Hematemesis: Secondary | ICD-10-CM

## 2019-10-03 DIAGNOSIS — K2210 Ulcer of esophagus without bleeding: Secondary | ICD-10-CM

## 2019-10-03 DIAGNOSIS — R1319 Other dysphagia: Secondary | ICD-10-CM

## 2019-10-03 MED ORDER — BUPROPION HCL ER (SR) 100 MG PO TB12: 100.0000 mg | ORAL_TABLET | Freq: Every day | ORAL | 0 refills | Status: DC

## 2019-10-03 NOTE — Telephone Encounter (Signed)
Patient called back and would like to try the Wellbutrin SR 100 mg 1 every morning. Rx for #30, no refills submitted to her [facility] pharmacy on file. Patient aware to discontinue Wellbutrin XL. Advised to call back with further concerns.

## 2019-10-03 NOTE — Telephone Encounter (Signed)
Tried to reach patient this morning still unable to leave a voicemail

## 2019-10-03 NOTE — Telephone Encounter (Signed)
Patient is scheduled for COVID screening at [facility] on 10/20/19 at 2:50 pm. EGD with possible dilation on 10/24/19 at [facility]. Arrive at 11:00 am. Patient aware. Confirmed her mailing address. Instructions mailed.

## 2019-10-03 NOTE — Telephone Encounter (Signed)
1. Have you developed a fever since your procedure? no  2.   Have you had an respiratory symptoms (SOB or cough) since your procedure? no  3.   Have you tested positive for COVID 19 since your procedure no  4.   Have you had any family members/close contacts diagnosed with the COVID 19 since your procedure?  no   If yes to any of these questions please route to Joylene John, RN and Alphonsa Gin, Therapist, sports.     Follow up Call-  Call back number 09/29/2019 06/28/2018 04/28/2017  Post procedure Call Back phone  # [phone]  Permission to leave phone message Yes Yes Yes  Some recent data might be hidden     Patient questions:  Do you have a fever, pain , or abdominal swelling? No. Pain Score  0 *  Have you tolerated food without any problems? Yes.    Have you been able to return to your normal activities? Yes.    Do you have any questions about your discharge instructions: Diet   No. Medications  No. Follow up visit  No.  Do you have questions or concerns about your Care? No.   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 She said she didn't have time to schedule with me this morning as she was heading to work. Actions: * If pain score is 4 or above: No action needed, pain <4.

## 2019-10-03 NOTE — Telephone Encounter (Signed)
Thank you Sarah. I will take care of this.

## 2019-10-04 ENCOUNTER — Encounter: Payer: Medicare HMO | Admitting: Acute Care

## 2019-10-04 ENCOUNTER — Inpatient Hospital Stay: Admission: RE | Admit: 2019-10-04 | Payer: Medicare HMO | Source: Ambulatory Visit

## 2019-10-06 ENCOUNTER — Telehealth: Payer: Self-pay

## 2019-10-06 NOTE — Telephone Encounter (Signed)
Checked on [employer] and patient's Esomeprazole magnesium 40mg  packets have been approved thru 08/30/2020. I spoke to the pharmacy and they said she has already picked it up.

## 2019-10-09 ENCOUNTER — Other Ambulatory Visit (INDEPENDENT_AMBULATORY_CARE_PROVIDER_SITE_OTHER): Payer: Medicare HMO

## 2019-10-09 ENCOUNTER — Other Ambulatory Visit: Payer: Self-pay

## 2019-10-09 ENCOUNTER — Telehealth: Payer: Self-pay | Admitting: Radiology

## 2019-10-09 ENCOUNTER — Telehealth: Payer: Self-pay | Admitting: Psychiatry

## 2019-10-09 ENCOUNTER — Ambulatory Visit (INDEPENDENT_AMBULATORY_CARE_PROVIDER_SITE_OTHER): Payer: Medicare HMO | Admitting: Addiction (Substance Use Disorder)

## 2019-10-09 ENCOUNTER — Encounter: Payer: Self-pay | Admitting: Addiction (Substance Use Disorder)

## 2019-10-09 ENCOUNTER — Telehealth: Payer: Self-pay

## 2019-10-09 DIAGNOSIS — K920 Hematemesis: Secondary | ICD-10-CM | POA: Diagnosis not present

## 2019-10-09 DIAGNOSIS — D500 Iron deficiency anemia secondary to blood loss (chronic): Secondary | ICD-10-CM | POA: Diagnosis not present

## 2019-10-09 DIAGNOSIS — F411 Generalized anxiety disorder: Secondary | ICD-10-CM

## 2019-10-09 DIAGNOSIS — F319 Bipolar disorder, unspecified: Secondary | ICD-10-CM

## 2019-10-09 DIAGNOSIS — E871 Hypo-osmolality and hyponatremia: Secondary | ICD-10-CM | POA: Diagnosis not present

## 2019-10-09 DIAGNOSIS — F3130 Bipolar disorder, current episode depressed, mild or moderate severity, unspecified: Secondary | ICD-10-CM

## 2019-10-09 DIAGNOSIS — E538 Deficiency of other specified B group vitamins: Secondary | ICD-10-CM

## 2019-10-09 LAB — MAGNESIUM: Magnesium: 1.8 mg/dL (ref 1.5–2.5)

## 2019-10-09 LAB — CBC WITH DIFFERENTIAL/PLATELET
Basophils Absolute: 0.1 10*3/uL (ref 0.0–0.1)
Basophils Relative: 1.2 % (ref 0.0–3.0)
Eosinophils Absolute: 0.5 10*3/uL (ref 0.0–0.7)
Eosinophils Relative: 4.8 % (ref 0.0–5.0)
HCT: 31.2 % — ABNORMAL LOW (ref 36.0–46.0)
Hemoglobin: 10.6 g/dL — ABNORMAL LOW (ref 12.0–15.0)
Lymphocytes Relative: 20.8 % (ref 12.0–46.0)
Lymphs Abs: 2.1 10*3/uL (ref 0.7–4.0)
MCHC: 33.9 g/dL (ref 30.0–36.0)
MCV: 87.1 fl (ref 78.0–100.0)
Monocytes Absolute: 0.5 10*3/uL (ref 0.1–1.0)
Monocytes Relative: 4.8 % (ref 3.0–12.0)
Neutro Abs: 6.8 10*3/uL (ref 1.4–7.7)
Neutrophils Relative %: 68.4 % (ref 43.0–77.0)
Platelets: 737 10*3/uL — ABNORMAL HIGH (ref 150.0–400.0)
RBC: 3.59 Mil/uL — ABNORMAL LOW (ref 3.87–5.11)
RDW: 14.2 % (ref 11.5–15.5)
WBC: 9.9 10*3/uL (ref 4.0–10.5)

## 2019-10-09 LAB — BASIC METABOLIC PANEL
BUN: 10 mg/dL (ref 6–23)
CO2: 26 mEq/L (ref 19–32)
Calcium: 8.9 mg/dL (ref 8.4–10.5)
Chloride: 94 mEq/L — ABNORMAL LOW (ref 96–112)
Creatinine, Ser: 0.55 mg/dL (ref 0.40–1.20)
GFR: 114.3 mL/min (ref >60.00)
Glucose, Bld: 125 mg/dL — ABNORMAL HIGH (ref 70–99)
Potassium: 3.8 mEq/L (ref 3.5–5.1)
Sodium: 129 mEq/L — ABNORMAL LOW (ref 135–145)

## 2019-10-09 LAB — IBC PANEL
Iron: 35 ug/dL — ABNORMAL LOW (ref 42–145)
Saturation Ratios: 7.5 % — ABNORMAL LOW (ref 20.0–50.0)
Transferrin: 333 mg/dL (ref 212.0–360.0)

## 2019-10-09 LAB — FERRITIN: Ferritin: 29.8 ng/mL (ref 10.0–291.0)

## 2019-10-09 MED ORDER — CARIPRAZINE HCL 1.5 MG PO CAPS: 1.5000 mg | ORAL_CAPSULE | Freq: Every day | ORAL | 0 refills | Status: DC

## 2019-10-09 NOTE — Telephone Encounter (Signed)
Patient notified of the change of the start time for her 10/24/19 EGD at [facility]. She will have a start time of 11:30 am on 10/24/19. She is instructed to arrive at 10:00 am.

## 2019-10-09 NOTE — Telephone Encounter (Signed)
Lab released IBC and Ferritin, didn't do Vit B1 due to special draw instructions and patient had already left.

## 2019-10-09 NOTE — Progress Notes (Signed)
[facility] Counselor/Therapist Progress Note  Patient ID: Robin Welch, MRN: [id number],    Date: 10/09/2019  Time Spent: 10:03- 10:57 54 mins  Treatment Type: Individual Therapy  Reported Symptoms: crying non-stop, depressed, hopeless, obsessive anxious worry.    Mental Status Exam:  Appearance:   Casual     Behavior:  Sharing  Motor:  Normal  Speech/Language:   Normal Rate  Affect:  Constricted  Mood:  angry, anxious and labile  Thought process:  flight of ideas  Thought content:    Rumination  Sensory/Perceptual disturbances:    WNL  Orientation:  x4  Attention:  Good  Concentration:  Good  Memory:  WNL  Fund of knowledge:   Good  Insight:    Good  Judgment:   Good  Impulse Control:  Fair   Risk Assessment: Danger to Self:  No Self-injurious Behavior: No Danger to Others: No Duty to Warn:no Physical Aggression / Violence:No  Access to Firearms a concern: No  Gang Involvement:No   Subjective: Client came in today reporting feeling out of control: crying non-stop, feeling depressed and hopeless and having obsessive anxious worry about getting COVID or never getting better medically. Client shared how ashamed she feels that she has to talk with someone about her problems. Client shared that she has been having black stools and horrible stomach pains and GI upset. Client shared that she has been feeling hopeless that things wouldn't get better medically and that she will die from all of this. Client concerned that the new medication that she started may be worsening her symptoms. Therapist used MI and Mindfulness to help her feel validated, make reflections, and to help her regulate her anxiety during session. Therapist trained client to learn to implement more calming techniques as a part of DBT. Therapist helped client continue to grieve her loss of ability to eat any foods and having to be on a liquid diet for 2 months. Client talked about her thoughts that make her  feel more hopeless and therapist used CBT to help client continue to challenge thoughts that are delusional and shaming. Client made progress reporting hope about her ulcers healing so she can get her esophagus stretched so she can eat.   Interventions: Cognitive Behavioral Therapy, Dialectical Behavioral Therapy, Mindfulness Meditation and Motivational Interviewing   Diagnosis:   ICD-10-CM   1. Bipolar I disorder, most recent episode depressed ([place])  F31.30   2. Generalized anxiety disorder  F41.1     Plan of Care: Client to return for weekly therapy with Sammuel Cooper, therapist, for outpatient therapy, to review again in 3-6 months. Client is to CONTINUE seeing medication provider for support of mood management, triggers and cravings.   Client to follow through with adding more support for them in their SUD txt: engaging in 12 step meetings. Client to engage in [place] txt and RPT relapse prevention therapy AEB coming to therapy weekly and implementing recovery oriented coping strategies to help reduce use of substances and to find relief from symptoms of MH disorders and trauma that cause them to want to escape from reality and numb their mind&body.  Client also to create more stability & structure AEB goal planning with therapist to assist in helping them get into a healthy rhythm/ schedule that can help to calm their nervous system and begin to build more healthy brain neuropathways.  Client to engage in CBT: challenging negative internal ruminationsand self-talk AEB expressing toxic thoughts and challenging them with truth.  Client to practice DBT distress tolerance skills (such as distress tolerance and emotion regulation) to practice achieving wise mind and to build support for dealing with cravings AEB learning to ride the wave of emotion/sensation/etc instead of seeking negative self-soothing techniques: ie using substances to numb self.  Client to utilize BSP (brainspotting) with therapist to  help client identify and process triggers for their MH/ SUD with goal of reducing SUDs by 33% each session. Client to prioritize sleep 8+ hours each week night AEB going to bed by 10pm each night. Client participated in the treatment planning of their therapy. Client agreed with the plan and understands what to do if there is a crisis: call 9-1-1 and/or crisis line given by therapist.  Barnie Del, LCSW, LCAS, CCTP, CCS-I, BSP

## 2019-10-09 NOTE — Telephone Encounter (Signed)
Pt would like a call to discuss some medication concerns. She has an appt scheduled for 2/12.

## 2019-10-09 NOTE — Telephone Encounter (Signed)
She reports that she is feeling somewhat better after seeing therapist and anxiety has decreased some. She reports that she has had severe depression and crying for 2 consecutive days. "I've been gloom and doom." She reports that she is now on a liquid diet per GI specialist and reports that this has had a negative effect on her mental health with having less to look forward to. Reports that she has been having some increased mood lability. Has had very low energy and motivation. Denies SI.   Past Psychiatric Medication Trials: Xanax- Misused Klonopin Seroquel- Helpful for sleep and anxiety.  Olanzapine Geodon Abilify Lithium Lamictal Depakote Buspar Hydroxyzine Gabapentin Prozac- Hives, edema Celexa- Possible allergic reaction Paxil Wellbutrin XL [place] Ambien Zolpimist Lunesta Trazodone  Plan: Discussed potential benefits, risks, and side effects of Vraylar. Discussed potential metabolic side effects associated with atypical antipsychotics, as well as potential risk for movement side effects. Advised pt to contact office if movement side effects occur.  Start Vraylar 1.5 mg po qd for Bipolar Depression. Stop Wellbutrin SR since this has not been effective for her depression and has caused some headaches.

## 2019-10-09 NOTE — Telephone Encounter (Signed)
[facility] called to ask for additional orders per patients request, she is there now. Patient wants"vitamin levels" done??? The lab is drawing labs from Oct.

## 2019-10-09 NOTE — Telephone Encounter (Signed)
Can we add iron panel with ferritin to blood in lab?  Ordered.  Just had vit B12 and D checked 07/2019 so did not recheck this time.

## 2019-10-10 ENCOUNTER — Other Ambulatory Visit: Payer: Self-pay | Admitting: Psychiatry

## 2019-10-10 DIAGNOSIS — F3130 Bipolar disorder, current episode depressed, mild or moderate severity, unspecified: Secondary | ICD-10-CM

## 2019-10-13 ENCOUNTER — Telehealth: Payer: Self-pay | Admitting: Gastroenterology

## 2019-10-13 ENCOUNTER — Encounter: Payer: Self-pay | Admitting: Psychiatry

## 2019-10-13 ENCOUNTER — Other Ambulatory Visit: Payer: Self-pay

## 2019-10-13 ENCOUNTER — Ambulatory Visit (INDEPENDENT_AMBULATORY_CARE_PROVIDER_SITE_OTHER): Payer: Medicare HMO | Admitting: Psychiatry

## 2019-10-13 ENCOUNTER — Telehealth: Payer: Self-pay | Admitting: Psychiatry

## 2019-10-13 DIAGNOSIS — F319 Bipolar disorder, unspecified: Secondary | ICD-10-CM

## 2019-10-13 DIAGNOSIS — F411 Generalized anxiety disorder: Secondary | ICD-10-CM

## 2019-10-13 MED ORDER — CARIPRAZINE HCL 1.5 MG PO CAPS: 1.5000 mg | ORAL_CAPSULE | Freq: Every day | ORAL | 1 refills | Status: DC

## 2019-10-13 NOTE — Telephone Encounter (Signed)
I had to leave a voicemail with the pharmacy. Asked they fax over the rejection claim or the prior authorization claim. Checked Cover My Meds. Nothing entered there either.

## 2019-10-13 NOTE — Telephone Encounter (Signed)
Prior authorization done through Cover My Meds this afternoon at 4:41pm..... Pts insurance has up to 72 hours to respond

## 2019-10-13 NOTE — Telephone Encounter (Signed)
Robin Welch, No I havent I looked they have sent [place] nothing  Looks like [facility] did prior auth on 2/5 for Nexium  But I see nothing for carafate liquid   I tried to call pharmacy to get them to send a Request but I could not get through

## 2019-10-13 NOTE — Telephone Encounter (Signed)
Pt was seen today and prescribed Vraylar. Pt would like to discuss other options due to the expense.

## 2019-10-13 NOTE — Telephone Encounter (Signed)
Patient is calling to follow up on medication request states she does not have any medication left for today nor the weekend.

## 2019-10-13 NOTE — Telephone Encounter (Signed)
Have you gotten anything on this prescription?

## 2019-10-13 NOTE — Progress Notes (Signed)
   10/13/19 1406  Facial and Oral Movements  Muscles of Facial Expression 0  Lips and Perioral Area 0  Jaw 0  Tongue 0  Extremity Movements  Upper (arms, wrists, hands, fingers) 0  Lower (legs, knees, ankles, toes) 0  Trunk Movements  Neck, shoulders, hips 0  Overall Severity  Severity of abnormal movements (highest score from questions above) 0  Incapacitation due to abnormal movements 0  Patient's awareness of abnormal movements (rate only patient's report) 0  AIMS Total Score  AIMS Total Score 0

## 2019-10-13 NOTE — Telephone Encounter (Signed)
Called patient to inform we are waiting on a response from her insurance [employer]

## 2019-10-13 NOTE — Progress Notes (Signed)
Robin Welch [id number] 08/13/1966 54 y.o.  Subjective:   Patient ID:  Robin Welch is a 54 y.o. (DOB Aug 14, 1966) female.  Chief Complaint:  Chief Complaint  Patient presents with  . Depression  . Anxiety    HPI Robin Welch presents to the office today for follow-up of depression and anxiety. She reports, "I woke up this morning and feeling the best I have felt in 10 years." She reports that she feels alpha stim and Vraylar have been helpful for her. She reports that alpha stim has a calming effect on her. She reports that she has been taking Vraylar 3 mg po qd. She reports improved energy and motivation. Reports motivation and inerest has improved but is still lower than she would like. Reports that she has some mild depression currently. She reports that she wants to go to bed earlier than usual to end the day. Sleeping ok with medication. Denies any recent panic or uncontrolled crying. Reports that she has periods of anxiety that are relieved with alpha stimulation. Unsure about concentration. Appetite has been ok but is currently on a liquid diet. Forward thinking and thinking about searching for a job. Denies any impulsivity or risky behaviors. Denies excessive spending. Denies SI.   Denies any cravings to use substances.   Past Psychiatric Medication Trials: Xanax- Misused Klonopin Seroquel- Helpful for sleep and anxiety.  Olanzapine Geodon Abilify Lithium Lamictal Depakote Buspar Hydroxyzine Gabapentin Prozac- Hives, edema Celexa- Possible allergic reaction Paxil Wellbutrin XL Sonata Ambien Zolpimist Lunesta Trazodone   AIMS     Office Visit from 10/13/2019 in [place] Total Score  0    PHQ2-9     Office Visit from 06/21/2017 in [place] at [facility] Visit from 12/07/2014 in [place] at [facility] Total Score  0  2       Review of Systems:  Review of Systems  Musculoskeletal: Negative for gait  problem.  Neurological: Negative for tremors.  Psychiatric/Behavioral:       Please refer to HPI   Recent low sodium.    Medications: I have reviewed the patient's current medications.  Current Outpatient Medications  Medication Sig Dispense Refill  . albuterol (VENTOLIN HFA) 108 (90 Base) MCG/ACT inhaler INHALE 2 PUFFS BY MOUTH EVERY 6 HOURS AS NEEDED FOR WHEEZING OR SHORTNESS OF BREATH 54 g 0  . buPROPion (WELLBUTRIN SR) 100 MG 12 hr tablet Take 1 tablet (100 mg total) by mouth daily. 30 tablet 0  . calcium carbonate (CALCIUM 600) 600 MG TABS tablet Take 1 tablet (600 mg total) by mouth daily with breakfast. 3 tablet   . cariprazine (VRAYLAR) capsule Take 1 capsule (1.5 mg total) by mouth daily. 30 capsule 1  . Cholecalciferol (VITAMIN D) 50 MCG (2000 UT) CAPS Take 1 capsule (2,000 Units total) by mouth daily.    Marland Kitchen esomeprazole (NEXIUM) 40 MG packet Take 40 mg by mouth 2 (two) times daily at 8 am and 10 pm. 60 each 12  . ondansetron (ZOFRAN-ODT) 4 MG disintegrating tablet Take 1 tablet (4 mg total) by mouth every 8 (eight) hours as needed for nausea or vomiting. 20 tablet 2  . QUEtiapine (SEROQUEL) 200 MG tablet TAKE 4 TABLETS(800 MG) BY MOUTH AT BEDTIME 120 tablet 1  . sucralfate (CARAFATE) 1 GM/10ML suspension Take 10 mLs (1 g total) by mouth 4 (four) times daily. Take before meals and at bedtime 420 mL 3  . Suvorexant (BELSOMRA) 15 MG TABS Take 15  mg by mouth at bedtime. 90 tablet 0  . Tiotropium Bromide Monohydrate (SPIRIVA RESPIMAT) 2.5 MCG/ACT AERS Inhale 1 puff into the lungs daily. 4 g 6   No current facility-administered medications for this visit.    Medication Side Effects: None  Allergies:  Allergies  Allergen Reactions  . Prozac [Fluoxetine Hcl] Swelling and Rash  . Doxycycline Nausea And Vomiting  . Celexa [Citalopram Hydrobromide] Other (See Comments)    Possible allergic reaction  . Chantix [Varenicline Tartrate] Swelling    Mouth swelling  . Hydroxyzine Other  (See Comments)    Not effective  . Klonopin [Clonazepam] Other (See Comments)    Caused instablility. Not able to drive due to over medicated  . Lunesta [Eszopiclone] Other (See Comments)    Not effective  . Prozac [Fluoxetine Hcl] Swelling  . Sonata [Zaleplon] Other (See Comments)    Not effective  . Trazodone And Nefazodone Other (See Comments)    ineffective  . Zolpimist [Zolpidem Tartrate] Other (See Comments)    Not effective  . Azithromycin Other (See Comments)    Reaction not recalled by the patient    Past Medical History:  Diagnosis Date  . Anemia   . Anxiety   . Barrett esophagus   . Bipolar 1 disorder [facility])    sees Dr. Candis Schatz psych [phone])  . Closed fracture distal radius and ulna, left, initial encounter 04/20/2019   S/p ORIF 04/2019 [facility])   . COPD (chronic obstructive pulmonary disease) ([place]) 03/2013   hyperinflation by CXR  . Elevated alkaline phosphatase level   . Esophagitis   . Hiatal hernia   . History of cocaine dependence ([place]) latest 2020   undergoing detox  . History of stomach ulcers   . Hx: UTI (urinary tract infection)   . Migraine    "stopped in ~ 2008" (03/11/2017)  . Muscle strain of gluteal region, right, subsequent encounter 10/05/2018   By MRI 2020  . Osteoporosis 06/09/2018   DEXA 05/2017 - T score -2.5 L hip, -1.3 spine  . Positive PPD 1989   s/p CXR and INH 6 mo  . Seizure ([place]) X 1   "from takiing too many headache medicine?" (03/11/2017)  . Smoker     Family History  Problem Relation Age of Onset  . Diabetes Mother        T2DM  . Breast cancer Mother 22  . Dementia Mother   . Lymphoma Mother   . Coronary artery disease Father 21       MI  . Anxiety disorder Paternal Aunt   . Anxiety disorder Cousin   . Depression Cousin   . ADD / ADHD Child   . Anxiety disorder Child   . Depression Child   . Stroke Neg Hx   . Colon cancer Neg Hx   . Cancer - Colon Neg Hx   . Esophageal cancer Neg Hx   . Stomach cancer Neg Hx    . Rectal cancer Neg Hx     Social History   Socioeconomic History  . Marital status: Divorced    Spouse name: Not on file  . Number of children: 2  . Years of education: bachelors  . Highest education level: Not on file  Occupational History  . 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 Smoking status: Current Every Day Smoker    Packs/day: 1.00    Years: 36.00  Pack years: 36.00    Types: Cigarettes  . Smokeless tobacco: Never Used  Substance and Sexual Activity  . Alcohol use: Not Currently    Alcohol/week: 0.0 standard drinks    Comment: 03/11/2017 "stopped 09/18/2015; never an alcoholic"  . Drug use: Yes    Types: Cocaine    Comment: not currently  . Sexual activity: Never    Birth control/protection: Surgical  Other Topics Concern  . Not on file  Social History Narrative   Caffeine: 40 oz diet mountain dew/day   Lives with husband and 2 children, no pets      Social Determinants of Health   Financial Resource Strain:   . Difficulty of Paying Living Expenses: Not on file  Food Insecurity:   . Worried About Charity fundraiser in the Last Year: Not on file  . Ran Out of Food in the Last Year: Not on file  Transportation Needs:   . Lack of Transportation (Medical): Not on file  . Lack of Transportation (Non-Medical): Not on file  Physical Activity:   . Days of Exercise per Week: Not on file  . Minutes of Exercise per Session: Not on file  Stress:   . Feeling of Stress : Not on file  Social Connections:   . Frequency of Communication with Friends and Family: Not on file  . Frequency of Social Gatherings with Friends and Family: Not on file  . Attends Religious Services: Not on file  . Active Member of Clubs or Organizations: Not on file  . Attends Archivist Meetings: Not on file  . Marital Status: Not on file  Intimate Partner Violence:   . Fear of Current  or Ex-Partner: Not on file  . Emotionally Abused: Not on file  . Physically Abused: Not on file  . Sexually Abused: Not on file    Past Medical History, Surgical history, Social history, and Family history were reviewed and updated as appropriate.   Please see review of systems for further details on the patient's review from today.   Objective:   Physical Exam:  LMP 04/23/2015 (Approximate)   Physical Exam Constitutional:      General: She is not in acute distress.    Appearance: She is well-developed.  Musculoskeletal:        General: No deformity.  Neurological:     Mental Status: She is alert and oriented to person, place, and time.     Coordination: Coordination normal.  Psychiatric:        Attention and Perception: Attention and perception normal. She does not perceive auditory or visual hallucinations.        Mood and Affect: Mood normal. Mood is not anxious or depressed. Affect is not labile, blunt, angry or inappropriate.        Speech: Speech normal.        Behavior: Behavior normal.        Thought Content: Thought content normal. Thought content is not paranoid or delusional. Thought content does not include homicidal or suicidal ideation. Thought content does not include homicidal or suicidal plan.        Cognition and Memory: Cognition and memory normal.        Judgment: Judgment normal.     Comments: Insight intact     Lab Review:     Component Value Date/Time   NA 129 (L) 10/09/2019 1225   K 3.8 10/09/2019 1225   CL 94 (L) 10/09/2019 1225   CO2 26 10/09/2019  1225   GLUCOSE 125 (H) 10/09/2019 1225   BUN 10 10/09/2019 1225   CREATININE 0.55 10/09/2019 1225   CALCIUM 8.9 10/09/2019 1225   PROT 3.8 (L) 07/20/2019 0311   ALBUMIN 1.3 (L) 07/20/2019 0311   AST 15 07/20/2019 0311   ALT 23 07/20/2019 0311   ALKPHOS 202 (H) 07/20/2019 0311   BILITOT 0.6 07/20/2019 0311   GFRNONAA >60 07/20/2019 0311   GFRAA >60 07/20/2019 0311       Component Value  Date/Time   WBC 9.9 10/09/2019 1219   RBC 3.59 (L) 10/09/2019 1219   HGB 10.6 (L) 10/09/2019 1219   HGB 10.8 (L) 07/11/2018 1050   HCT 31.2 (L) 10/09/2019 1219   PLT 737.0 (H) 10/09/2019 1219   PLT 547 (H) 07/11/2018 1050   MCV 87.1 10/09/2019 1219   MCH 29.0 07/20/2019 0311   MCHC 33.9 10/09/2019 1219   RDW 14.2 10/09/2019 1219   LYMPHSABS 2.1 10/09/2019 1219   MONOABS 0.5 10/09/2019 1219   EOSABS 0.5 10/09/2019 1219   BASOSABS 0.1 10/09/2019 1219    Lithium Lvl  Date Value Ref Range Status  12/27/2010 1.43 (H) 0.80 - 1.40 mEq/L Final     No results found for: PHENYTOIN, PHENOBARB, VALPROATE, CBMZ   .res Assessment: Plan:   Discussed decreasing Vraylar to 1.5 mg p.o. daily as discussed since blood levels will continue to increase over the next several weeks and increasing the dose too quickly could result in side effects.  Will send prescription to pharmacy and provide additional samples since prior authorization will likely be needed. Will continue all other medications as prescribed. Recommend continuing psychotherapy with Sammuel Cooper, LCSW. Patient to follow-up in 4 weeks or sooner if clinically indicated. Patient advised to contact office with any questions, adverse effects, or acute worsening in signs and symptoms.  Robin Welch was seen today for depression and anxiety.  Diagnoses and all orders for this visit:  Generalized anxiety disorder  Bipolar I disorder ([place]) -     cariprazine (VRAYLAR) capsule; Take 1 capsule (1.5 mg total) by mouth daily.     Please see After Visit Summary for patient specific instructions.  Future Appointments  Date Time Provider [place]  10/16/2019 10:00 AM Barnie Del, LCSW CP-CP None  10/20/2019  2:50 PM [facility]-SCREENING [facility] None  10/23/2019 10:00 AM Barnie Del, LCSW CP-CP None  10/30/2019 10:00 AM Barnie Del, LCSW CP-CP None  11/10/2019 10:30 AM Thayer Headings, PMHNP CP-CP None    No orders of the defined types were  placed in this encounter.   -------------------------------

## 2019-10-16 ENCOUNTER — Ambulatory Visit (INDEPENDENT_AMBULATORY_CARE_PROVIDER_SITE_OTHER): Payer: Medicare HMO | Admitting: Addiction (Substance Use Disorder)

## 2019-10-16 ENCOUNTER — Other Ambulatory Visit: Payer: Self-pay

## 2019-10-16 ENCOUNTER — Encounter: Payer: Self-pay | Admitting: Addiction (Substance Use Disorder)

## 2019-10-16 DIAGNOSIS — F3130 Bipolar disorder, current episode depressed, mild or moderate severity, unspecified: Secondary | ICD-10-CM

## 2019-10-16 DIAGNOSIS — F411 Generalized anxiety disorder: Secondary | ICD-10-CM

## 2019-10-16 DIAGNOSIS — F1421 Cocaine dependence, in remission: Secondary | ICD-10-CM | POA: Diagnosis not present

## 2019-10-16 NOTE — Progress Notes (Signed)
[facility] Counselor/Therapist Progress Note  Patient ID: Robin Welch, MRN: [id number],    Date: 10/16/2019  Time Spent: 10:07- 11:01 54 mins  Treatment Type: Individual Therapy  Reported Symptoms: less crying spells, less anxiety and some hopefulness. Has energy and feeling blessed.      Mental Status Exam:  Appearance:   Neat     Behavior:  Sharing  Motor:  Normal  Speech/Language:   Normal Rate  Affect:  Appropriate, Congruent and Full Range  Mood:  angry, anxious and labile  Thought process:  flight of ideas  Thought content:    Rumination  Sensory/Perceptual disturbances:    WNL  Orientation:  x4  Attention:  Good  Concentration:  Good  Memory:  WNL  Fund of knowledge:   Good  Insight:    Good  Judgment:   Good  Impulse Control:  Fair   Risk Assessment: Danger to Self:  No Self-injurious Behavior: No Danger to Others: No Duty to Warn:no Physical Aggression / Violence:No  Access to Firearms a concern: No  Gang Involvement:No   Subjective: Client came in today reporting having a medication change: vraylar. Client reported less crying spells, less anxiety and some hopefulness. Client identified her improvement and having more energy and feeling blessed in life. Client reported having a run-in with not having her insurance cover the meds and now being nervous she cant get it. Therapist used MI and CBT to help client to feel validated and improve hopefulness and motivation for working on getting the medication approved. Client reported working with her medical doctors also to take care of her medical health. Therapist used RPT to help client continue to build her plan of self-care and set SMART goals to help her follow through. Client also discussed how her depression inhibits her sense of selfesteem, her motivation for taking care of herself, and increase of fatigue. Client processed her past suicidal ideation when her hopelessness increased. Client discussed how she  has continued to learn to get help for her MH. Client shared her need for continuing to learn how to deal with her anxiety and be able to sit in the present. Therapist introduced mindfulness exercises for client and helped to lead her in it. Client made progress in her management of health care/self.   Interventions: Cognitive Behavioral Therapy, Mindfulness Meditation and Motivational Interviewing   Diagnosis:   ICD-10-CM   1. Bipolar I disorder, most recent episode depressed ([place])  F31.30   2. History of cocaine dependence ([place])  F14.21   3. Generalized anxiety disorder  F41.1     Plan of Care: Client to return for weekly therapy with Sammuel Cooper, therapist, for outpatient therapy, to review again in 3-6 months. Client is to CONTINUE seeing medication provider for support of mood management, triggers and cravings.   Client to follow through with adding more support for them in their SUD txt: engaging in 12 step meetings. Client to engage in [place] txt and RPT relapse prevention therapy AEB coming to therapy weekly and implementing recovery oriented coping strategies to help reduce use of substances and to find relief from symptoms of MH disorders and trauma that cause them to want to escape from reality and numb their mind&body.  Client also to create more stability & structure AEB goal planning with therapist to assist in helping them get into a healthy rhythm/ schedule that can help to calm their nervous system and begin to build more healthy brain neuropathways.  Client to  engage in CBT: challenging negative internal ruminationsand self-talk AEB expressing toxic thoughts and challenging them with truth.  Client to practice DBT distress tolerance skills (such as distress tolerance and emotion regulation) to practice achieving wise mind and to build support for dealing with cravings AEB learning to ride the wave of emotion/sensation/etc instead of seeking negative self-soothing techniques: ie using  substances to numb self.  Client to utilize BSP (brainspotting) with therapist to help client identify and process triggers for their MH/ SUD with goal of reducing SUDs by 33% each session. Client to prioritize sleep 8+ hours each week night AEB going to bed by 10pm each night. Client participated in the treatment planning of their therapy. Client agreed with the plan and understands what to do if there is a crisis: call 9-1-1 and/or crisis line given by therapist.  Barnie Del, LCSW, LCAS, CCTP, CCS-I, BSP

## 2019-10-16 NOTE — Telephone Encounter (Signed)
[employer] approved liquid carafate until 08/30/2020 Will send form to be scanned in

## 2019-10-17 ENCOUNTER — Telehealth: Payer: Self-pay | Admitting: Family Medicine

## 2019-10-17 NOTE — Telephone Encounter (Signed)
Faxed form to [facility] Short Stay.  Attempted to schedule appt and kept getting vm.  Will try again tomorrow.

## 2019-10-17 NOTE — Telephone Encounter (Signed)
-----   Message from Helene Shoe, LPN sent at [phone]  3:37 PM EST ----- Pt called back requesting status of iron infusion appt. Pt request cb at [phone].

## 2019-10-17 NOTE — Telephone Encounter (Signed)
Called patient back to inform that carafate was approved and for her to contact her pharmacy

## 2019-10-17 NOTE — Telephone Encounter (Signed)
Patient is calling to follow up

## 2019-10-17 NOTE — Telephone Encounter (Signed)
Iron infusion order filled and placed in Robin Welch's box.

## 2019-10-18 NOTE — Telephone Encounter (Signed)
Her patient assistance formed was faxed this morning. I pulled samples of Vraylar 1.5 mg for patient to pick up today before bad weather.

## 2019-10-18 NOTE — Telephone Encounter (Addendum)
Attempted to do PA for infusion with [employer].  Told no PA in needed.  Ref #[id number]  [id number].  Notified Laverne at [facility] Short Stay.  Suggested I document info on order and refax to her.  Did as instructed.  Leverne recommends having pt call directly to schedule infusion.

## 2019-10-18 NOTE — Telephone Encounter (Signed)
Noted  

## 2019-10-18 NOTE — Telephone Encounter (Signed)
Spoke with Robin Welch to schedule iron infusion.  Says pt will need pre-cert first.

## 2019-10-18 NOTE — Telephone Encounter (Signed)
Lvm asking pt to call back.  Pt needs to call [facility] Short Stay at [phone] to schedule iron infusion.

## 2019-10-18 NOTE — Telephone Encounter (Signed)
Pt aware of lisa g comment.  Transfer pt to short stay laverne is going to help pt

## 2019-10-20 ENCOUNTER — Other Ambulatory Visit (HOSPITAL_COMMUNITY)
Admission: RE | Admit: 2019-10-20 | Discharge: 2019-10-20 | Disposition: A | Discharge status: Home / Self Care | Payer: Medicare HMO | Source: Ambulatory Visit | Attending: Gastroenterology | Admitting: Gastroenterology

## 2019-10-20 DIAGNOSIS — Z20822 Contact with and (suspected) exposure to covid-19: Secondary | ICD-10-CM | POA: Insufficient documentation

## 2019-10-20 DIAGNOSIS — Z01812 Encounter for preprocedural laboratory examination: Secondary | ICD-10-CM | POA: Diagnosis not present

## 2019-10-20 LAB — SARS CORONAVIRUS 2 (TAT 6-24 HRS): SARS Coronavirus 2: NEGATIVE

## 2019-10-20 NOTE — Telephone Encounter (Signed)
Tried to submit a PA to see if it would help costs, but she already has 1 on file for this.

## 2019-10-23 ENCOUNTER — Ambulatory Visit: Payer: Medicare HMO | Admitting: Addiction (Substance Use Disorder)

## 2019-10-23 ENCOUNTER — Other Ambulatory Visit: Payer: Self-pay

## 2019-10-23 DIAGNOSIS — K920 Hematemesis: Secondary | ICD-10-CM

## 2019-10-24 ENCOUNTER — Encounter (HOSPITAL_COMMUNITY): Payer: Self-pay | Admitting: Gastroenterology

## 2019-10-24 ENCOUNTER — Encounter (HOSPITAL_COMMUNITY)
Admission: RE | Disposition: A | Discharge status: Home / Self Care | Payer: Self-pay | Source: Home / Self Care | Attending: Gastroenterology

## 2019-10-24 ENCOUNTER — Other Ambulatory Visit: Payer: Self-pay

## 2019-10-24 ENCOUNTER — Ambulatory Visit (HOSPITAL_COMMUNITY): Payer: Medicare HMO | Admitting: Anesthesiology

## 2019-10-24 ENCOUNTER — Other Ambulatory Visit: Payer: Self-pay | Admitting: Psychiatry

## 2019-10-24 ENCOUNTER — Ambulatory Visit (HOSPITAL_COMMUNITY)
Admission: RE | Admit: 2019-10-24 | Discharge: 2019-10-24 | Disposition: A | Discharge status: Home / Self Care | Payer: Medicare HMO | Attending: Gastroenterology | Admitting: Gastroenterology

## 2019-10-24 DIAGNOSIS — F411 Generalized anxiety disorder: Secondary | ICD-10-CM

## 2019-10-24 DIAGNOSIS — F5105 Insomnia due to other mental disorder: Secondary | ICD-10-CM

## 2019-10-24 DIAGNOSIS — F319 Bipolar disorder, unspecified: Secondary | ICD-10-CM | POA: Insufficient documentation

## 2019-10-24 DIAGNOSIS — F17210 Nicotine dependence, cigarettes, uncomplicated: Secondary | ICD-10-CM | POA: Diagnosis not present

## 2019-10-24 DIAGNOSIS — K449 Diaphragmatic hernia without obstruction or gangrene: Secondary | ICD-10-CM | POA: Diagnosis not present

## 2019-10-24 DIAGNOSIS — K2210 Ulcer of esophagus without bleeding: Secondary | ICD-10-CM

## 2019-10-24 DIAGNOSIS — K920 Hematemesis: Secondary | ICD-10-CM

## 2019-10-24 DIAGNOSIS — R1310 Dysphagia, unspecified: Secondary | ICD-10-CM | POA: Diagnosis not present

## 2019-10-24 DIAGNOSIS — K2100 Gastro-esophageal reflux disease with esophagitis, without bleeding: Secondary | ICD-10-CM | POA: Diagnosis not present

## 2019-10-24 DIAGNOSIS — Z09 Encounter for follow-up examination after completed treatment for conditions other than malignant neoplasm: Secondary | ICD-10-CM | POA: Diagnosis not present

## 2019-10-24 DIAGNOSIS — K222 Esophageal obstruction: Secondary | ICD-10-CM | POA: Diagnosis not present

## 2019-10-24 DIAGNOSIS — J449 Chronic obstructive pulmonary disease, unspecified: Secondary | ICD-10-CM | POA: Insufficient documentation

## 2019-10-24 DIAGNOSIS — K209 Esophagitis, unspecified without bleeding: Secondary | ICD-10-CM | POA: Diagnosis not present

## 2019-10-24 HISTORY — PX: ESOPHAGOGASTRODUODENOSCOPY (EGD) WITH PROPOFOL: SHX5813

## 2019-10-24 SURGERY — ESOPHAGOGASTRODUODENOSCOPY (EGD) WITH PROPOFOL
Anesthesia: Monitor Anesthesia Care

## 2019-10-24 MED ORDER — LACTATED RINGERS IV SOLN
INTRAVENOUS | Status: DC
  Administered 2019-10-24: 11:00:00 1000 mL via INTRAVENOUS

## 2019-10-24 MED ORDER — SODIUM CHLORIDE 0.9 % IV SOLN: INTRAVENOUS | Status: DC

## 2019-10-24 MED ORDER — LIDOCAINE HCL 1 % IJ SOLN
INTRAMUSCULAR | Status: DC | PRN
  Administered 2019-10-24: 50 mg via INTRADERMAL

## 2019-10-24 MED ORDER — PROPOFOL 10 MG/ML IV BOLUS
INTRAVENOUS | Status: DC | PRN
  Administered 2019-10-24 (×2): 20 mg via INTRAVENOUS

## 2019-10-24 MED ORDER — PROPOFOL 500 MG/50ML IV EMUL
INTRAVENOUS | Status: DC | PRN
  Administered 2019-10-24: 150 ug/kg/min via INTRAVENOUS

## 2019-10-24 MED ORDER — PROPOFOL 10 MG/ML IV BOLUS
INTRAVENOUS | Status: AC
  Filled 2019-10-24: qty 20

## 2019-10-24 SURGICAL SUPPLY — 15 items

## 2019-10-24 NOTE — Transfer of Care (Signed)
Immediate Anesthesia Transfer of Care Note  Patient: Robin Welch  Procedure(s) Performed: ESOPHAGOGASTRODUODENOSCOPY (EGD) WITH PROPOFOL (N/A )  Patient Location: PACU and Endoscopy Unit  Anesthesia Type:MAC  Level of Consciousness: awake, alert , oriented and patient cooperative  Airway & Oxygen Therapy: Patient Spontanous Breathing and Patient connected to face mask oxygen  Post-op Assessment: Report given to RN and Post -op Vital signs reviewed and stable  Post vital signs: Reviewed and stable  Last Vitals:  Vitals Value Taken Time  BP 98/69 10/24/19 1130  Temp    Pulse 87 10/24/19 1132  Resp 21 10/24/19 1132  SpO2 100 % 10/24/19 1132  Vitals shown include unvalidated device data.  Last Pain:  Vitals:   10/24/19 1026  TempSrc: Oral  PainSc: 0-No pain         Complications: No apparent anesthesia complications

## 2019-10-24 NOTE — Anesthesia Preprocedure Evaluation (Addendum)
Anesthesia Evaluation  Patient identified by MRN, date of birth, ID band Patient awake    Reviewed: Allergy & Precautions, NPO status , Patient's Chart, lab work & pertinent test results  History of Anesthesia Complications Negative for: history of anesthetic complications  Airway Mallampati: II  TM Distance: >3 FB Neck ROM: Full    Dental no notable dental hx. (+) Dental Advisory Given, Teeth Intact   Pulmonary COPD,  COPD inhaler, Current Smoker and Patient abstained from smoking.,  2ppd x 35 years  No inhalers   Pulmonary exam normal        Cardiovascular negative cardio ROS Normal cardiovascular exam     Neuro/Psych  Headaches, PSYCHIATRIC DISORDERS Anxiety Bipolar Disorder    GI/Hepatic hiatal hernia, PUD, GERD  Medicated and Controlled,(+)     substance abuse  , Hx of cocaine use- nothing currently Asymptomatic GERD Has history of many gastric and esophageal ulcers in past     Endo/Other  negative endocrine ROS  Renal/GU negative Renal ROS  negative genitourinary   Musculoskeletal negative musculoskeletal ROS (+)   Abdominal Normal abdominal exam  (+)   Peds  Hematology negative hematology ROS (+) anemia ,   Anesthesia Other Findings Day of surgery medications reviewed with the patient.  Reproductive/Obstetrics negative OB ROS                            Anesthesia Physical  Anesthesia Plan  ASA: III  Anesthesia Plan: MAC   Post-op Pain Management:    Induction: Intravenous  PONV Risk Score and Plan: Propofol infusion and TIVA  Airway Management Planned: Natural Airway  Additional Equipment:   Intra-op Plan:   Post-operative Plan:   Informed Consent: I have reviewed the patients History and Physical, chart, labs and discussed the procedure including the risks, benefits and alternatives for the proposed anesthesia with the patient or authorized representative who  has indicated his/her understanding and acceptance.     Dental advisory given  Plan Discussed with: CRNA  Anesthesia Plan Comments:        Anesthesia Quick Evaluation

## 2019-10-24 NOTE — Op Note (Addendum)
[facility] Patient Name: Robin Welch Procedure Date: 10/24/2019 MRN: [id number] Attending MD: Mauri Pole , MD Date of Birth: [birth year] CSN: [id number] Age: 54 Admit Type: Outpatient Procedure:                Upper GI endoscopy Indications:              Dysphagia, Follow-up of reflux esophagitis Providers:                Mauri Pole, MD, Cleda Daub, RN, Cletis Athens, Technician, Karis Juba, CRNA Referring MD:              Medicines:                Monitored Anesthesia Care Complications:            No immediate complications. Estimated Blood Loss:     Estimated blood loss was minimal. Procedure:                Pre-Anesthesia Assessment:                           - Prior to the procedure, a History and Physical                            was performed, and patient medications and                            allergies were reviewed. The patient's tolerance of                            previous anesthesia was also reviewed. The risks                            and benefits of the procedure and the sedation                            options and risks were discussed with the patient.                            All questions were answered, and informed consent                            was obtained. Prior Anticoagulants: The patient has                            taken no previous anticoagulant or antiplatelet                            agents. ASA Grade Assessment: III - A patient with                            severe systemic disease. After reviewing the risks  and benefits, the patient was deemed in                            satisfactory condition to undergo the procedure.                           After obtaining informed consent, the endoscope was                            passed under direct vision. Throughout the                            procedure, the patient's blood pressure, pulse,  and                            oxygen saturations were monitored continuously. The                            [id number] Olympus gastroscope was                            introduced through the mouth, and advanced to the                            middle third of esophagus. The [id number]                            Olympus ultra slim endoscope was introduced through                            the mouth, and advanced to the second part of                            duodenum. The upper GI endoscopy was performed with                            difficulty due to stricture. Successful completion                            of the procedure was aided by withdrawing the scope                            and replacing with the pediatric endoscope. The                            patient tolerated the procedure well. Scope In: Scope Out: Findings:      LA Grade D (one or more mucosal breaks involving at least 75% of       esophageal circumference) esophagitis with no bleeding was found 28 to       32 cm from the incisors. Appears slightly better with mucosal healing       compared to last EGD in 1/21      The Z-line was at 35, unable to assess adequately due to severity  of       erosive esophagitis.      A few benign-appearing, intrinsic severe stenoses were found 28 to 31 cm       from the incisors. The narrowest stenosis measured 5 mm (inner       diameter). The stenoses were traversed after downsizing scope. Dilation       not attempted due to friability of mucosa with persistent severe erosive       esophagitis.      The gastroesophageal flap valve was visualized endoscopically and       classified as Hill Grade IV (no fold, wide open lumen, hiatal hernia       present).      A medium-sized hiatal hernia was present.      The stomach was normal.      The cardia and gastric fundus were normal on retroflexion.      The examined duodenum was normal. Impression:                - LA Grade D reflux esophagitis with no bleeding.                           - Z-line at 35, unable to assess adequately due to                            severity of erosive esophagitis, 35 cm from the                            incisors.                           - Benign-appearing esophageal stenoses.                           - Gastroesophageal flap valve classified as Hill                            Grade IV (no fold, wide open lumen, hiatal hernia                            present).                           - Medium-sized hiatal hernia.                           - Normal stomach.                           - Normal examined duodenum.                           - No specimens collected. Moderate Sedation:      Not Applicable - Patient had care per Anesthesia. Recommendation:           - Patient has a contact number available for                            emergencies. The signs and symptoms of  potential                            delayed complications were discussed with the                            patient. Return to normal activities tomorrow.                            Written discharge instructions were provided to the                            patient.                           - Resume previous diet.                           - Continue present medications.                           - Use Nexium (esomeprazole) packet 40 mg PO BID                            indefinitely.                           - Use sucralfate suspension 1 gram PO QID for 2                            months.                           - Follow an antireflux regimen.                           - Repeat upper endoscopy at the next available                            appointment at [facility] endoscopy in 4-6 weeks to check                            healing and esophageal dilation. Procedure Code(s):        --- Professional ---                           [phone], Esophagogastroduodenoscopy, flexible,                             transoral; diagnostic, including collection of                            specimen(s) by brushing or washing, when performed                            (separate procedure) Diagnosis Code(s):        --- Professional ---  K21.00, Gastro-esophageal reflux disease with                            esophagitis, without bleeding                           K44.9, Diaphragmatic hernia without obstruction or                            gangrene                           R13.10, Dysphagia, unspecified CPT copyright 2019 American Medical Association. All rights reserved. The codes documented in this report are preliminary and upon coder review may  be revised to meet current compliance requirements. Mauri Pole, MD 10/24/2019 11:33:46 AM This report has been signed electronically. Number of Addenda: 0

## 2019-10-24 NOTE — H&P (Signed)
[facility] History and Physical   Primary Care Physician:  Ria Bush, MD   Reason for Procedure:  GERD, erosive esophagitis, esophageal stricture  Plan:    EGD with esophageal dilation if needed    HPI: Robin Welch is a 54 y.o. female here for egd for follow up of severe erosive esophagitis and esophageal dilation as needed.   Past Medical History:  Diagnosis Date  . Anemia   . Anxiety   . Barrett esophagus   . Bipolar 1 disorder [facility])    sees Dr. Candis Schatz psych [phone])  . Closed fracture distal radius and ulna, left, initial encounter 04/20/2019   S/p ORIF 04/2019 Jeannie Fend)   . COPD (chronic obstructive pulmonary disease) ([place]) 03/2013   hyperinflation by CXR  . Elevated alkaline phosphatase level   . Esophagitis   . Hiatal hernia   . History of cocaine dependence ([place]) latest 2020   undergoing detox  . History of stomach ulcers   . Hx: UTI (urinary tract infection)   . Migraine    "stopped in ~ 2008" (03/11/2017)  . Muscle strain of gluteal region, right, subsequent encounter 10/05/2018   By MRI 2020  . Osteoporosis 06/09/2018   DEXA 05/2017 - T score -2.5 L hip, -1.3 spine  . Positive PPD 1989   s/p CXR and INH 6 mo  . Seizure ([place]) X 1   "from takiing too many headache medicine?" (03/11/2017)  . Smoker     Past Surgical History:  Procedure Laterality Date  . BIOPSY  07/19/2019   Procedure: BIOPSY;  Surgeon: Gatha Mayer, MD;  Location: [facility] ENDOSCOPY;  Service: Endoscopy;;  . COLONOSCOPY  07/2018   poor colon prep - rec repeat (Khadeem Rockett)  . DILATION AND CURETTAGE OF UTERUS  X 1   S/P miscarriage  . ESOPHAGOGASTRODUODENOSCOPY  03/2017   candidal esophagitis, treated, LA grade D esophagitis, 4cm HH (Rishabh Rinkenberger)  . ESOPHAGOGASTRODUODENOSCOPY  07/2018   severe reflux esophagitis, neg candida (Rudie Rikard)  . ESOPHAGOGASTRODUODENOSCOPY (EGD) WITH PROPOFOL N/A 07/19/2019   Procedure: ESOPHAGOGASTRODUODENOSCOPY (EGD) WITH PROPOFOL;  Surgeon:  Gatha Mayer, MD;  Location: [facility] ENDOSCOPY;  Service: Endoscopy;  Laterality: N/A;  . LAPAROSCOPIC CHOLECYSTECTOMY  2006  . ORIF WRIST FRACTURE Right 04/08/2019   Procedure: OPEN REDUCTION INTERNAL FIXATION (ORIF) DISTAL RADIUS/ULNA FRACTURE;  Surgeon: Verner Mould, MD;  Location: [facility];  Service: Orthopedics;  Laterality: Right;  . TUBAL LIGATION  2003    Prior to Admission medications   Medication Sig Start Date End Date Taking? Authorizing Provider  calcium carbonate (CALCIUM 600) 600 MG TABS tablet Take 1 tablet (600 mg total) by mouth daily with breakfast. 07/08/19  Yes Ria Bush, MD  cariprazine [facility]) capsule Take 1 capsule (1.5 mg total) by mouth daily. 10/13/19  Yes Thayer Headings, PMHNP  Cholecalciferol (VITAMIN D) 50 MCG (2000 UT) CAPS Take 1 capsule (2,000 Units total) by mouth daily. 07/06/19  Yes Ria Bush, MD  esomeprazole (NEXIUM) 40 MG packet Take 40 mg by mouth 2 (two) times daily at 8 am and 10 pm. 09/29/19  Yes Cleaster Shiffer, Venia Minks, MD  QUEtiapine (SEROQUEL) 200 MG tablet TAKE 4 TABLETS(800 MG) BY MOUTH AT BEDTIME Patient taking differently: Take 800 mg by mouth at bedtime.  09/12/19  Yes Thayer Headings, PMHNP  sucralfate (CARAFATE) 1 GM/10ML suspension Take 10 mLs (1 g total) by mouth 4 (four) times daily. Take before meals and at bedtime Patient taking differently: Take 1 g by mouth 4 (four) times daily.  Take before meals and at bedtime 10 ml 09/29/19  Yes Easter Schinke, Venia Minks, MD  Suvorexant (BELSOMRA) 15 MG TABS Take 15 mg by mouth at bedtime. 09/12/19  Yes Thayer Headings, PMHNP  albuterol (VENTOLIN HFA) 108 (90 Base) MCG/ACT inhaler INHALE 2 PUFFS BY MOUTH EVERY 6 HOURS AS NEEDED FOR WHEEZING OR SHORTNESS OF BREATH Patient taking differently: Inhale 2 puffs into the lungs every 4 (four) hours as needed for wheezing.  08/15/19   Ria Bush, MD  buPROPion [facility] SR) 100 MG 12 hr tablet Take 1 tablet (100 mg total) by mouth daily. Patient  not taking: Reported on 10/17/2019 10/03/19   Thayer Headings, PMHNP  ondansetron (ZOFRAN-ODT) 4 MG disintegrating tablet Take 1 tablet (4 mg total) by mouth every 8 (eight) hours as needed for nausea or vomiting. Patient not taking: Reported on 10/17/2019 09/12/19   Ria Bush, MD  Tiotropium Bromide Monohydrate (SPIRIVA RESPIMAT) 2.5 MCG/ACT AERS Inhale 1 puff into the lungs daily. Patient not taking: Reported on 10/17/2019 07/06/19   Ria Bush, MD    Current Facility-Administered Medications  Medication Dose Route Frequency Provider Last Rate Last Admin  . 0.9 %  sodium chloride infusion   Intravenous Continuous Jacaria Colburn V, MD      . 0.9 %  sodium chloride infusion   Intravenous Continuous Marsden Zaino V, MD      . lactated ringers infusion   Intravenous Continuous Jaydynn Wolford V, MD 20 mL/hr at 10/24/19 1036 1,000 mL at 10/24/19 1036    Allergies as of 10/03/2019 - Review Complete 09/29/2019  Allergen Reaction Noted  . Prozac [fluoxetine hcl] Swelling and Rash 12/18/2010  . Doxycycline Nausea And Vomiting 10/31/2014  . Celexa [citalopram hydrobromide] Other (See Comments) 05/06/2018  . Chantix [varenicline tartrate] Swelling 03/28/2018  . Hydroxyzine Other (See Comments) 05/06/2018  . Klonopin [clonazepam] Other (See Comments) 05/06/2018  . Lunesta [eszopiclone] Other (See Comments) 05/06/2018  . Prozac [fluoxetine hcl] Swelling 06/21/2019  . Sonata [zaleplon] Other (See Comments) 05/06/2018  . Trazodone and nefazodone Other (See Comments) 05/06/2018  . Zolpimist [zolpidem tartrate] Other (See Comments) 05/06/2018  . Azithromycin Other (See Comments) 04/03/2015    Family History  Problem Relation Age of Onset  . Diabetes Mother        T2DM  . Breast cancer Mother 23  . Dementia Mother   . Lymphoma Mother   . Coronary artery disease Father 22       MI  . Anxiety disorder Paternal Aunt   . Anxiety disorder Cousin   . Depression Cousin   . ADD /  ADHD Child   . Anxiety disorder Child   . Depression Child   . Stroke Neg Hx   . Colon cancer Neg Hx   . Cancer - Colon Neg Hx   . Esophageal cancer Neg Hx   . Stomach cancer Neg Hx   . Rectal cancer Neg Hx     Social History   Socioeconomic History  . Marital status: Divorced    Spouse name: Not on file  . Number of children: 2  . Years of education: bachelors  . Highest education level: Not on file  Occupational History  . 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 Smoking status: Current Every Day Smoker    Packs/day: 1.00    Years: 36.00    Pack years: 36.00    Types: Cigarettes  .  Smokeless tobacco: Never Used  Substance and Sexual Activity  . Alcohol use: Not Currently    Alcohol/week: 0.0 standard drinks    Comment: 03/11/2017 "stopped 09/18/2015; never an alcoholic"  . Drug use: Yes    Types: Cocaine    Comment: not currently  . Sexual activity: Never    Birth control/protection: Surgical  Other Topics Concern  . Not on file  Social History Narrative   Caffeine: 40 oz diet mountain dew/day   Lives with husband and 2 children, no pets      Social Determinants of Health   Financial Resource Strain:   . Difficulty of Paying Living Expenses: Not on file  Food Insecurity:   . Worried About Charity fundraiser in the Last Year: Not on file  . Ran Out of Food in the Last Year: Not on file  Transportation Needs:   . Lack of Transportation (Medical): Not on file  . Lack of Transportation (Non-Medical): Not on file  Physical Activity:   . Days of Exercise per Week: Not on file  . Minutes of Exercise per Session: Not on file  Stress:   . Feeling of Stress : Not on file  Social Connections:   . Frequency of Communication with Friends and Family: Not on file  . Frequency of Social Gatherings with Friends and Family: Not on file  . Attends Religious Services: Not on file  .  Active Member of Clubs or Organizations: Not on file  . Attends Archivist Meetings: Not on file  . Marital Status: Not on file  Intimate Partner Violence:   . Fear of Current or Ex-Partner: Not on file  . Emotionally Abused: Not on file  . Physically Abused: Not on file  . Sexually Abused: Not on file    Review of Systems:  All other review of systems negative except as mentioned in the HPI.  Physical Exam: Vital signs in last 24 hours: Temp:  [98.3 F (36.8 C)] 98.3 F (36.8 C) (02/23 1026) Pulse Rate:  [101] 101 (02/23 1026) Resp:  [18] 18 (02/23 1026) BP: (120)/(82) 120/82 (02/23 1026) SpO2:  [97 %] 97 % (02/23 1026) Weight:  [49.9 kg] 49.9 kg (02/23 1026)   General:   Alert,  pleasant and cooperative in NAD  Neuro/Psych:  Alert and cooperative. Normal mood and affect. A and O x 3   K. Denzil Magnuson , MD [phone]

## 2019-10-24 NOTE — Discharge Instructions (Signed)
YOU HAD AN ENDOSCOPIC PROCEDURE TODAY: Refer to the procedure report and other information in the discharge instructions given to you for any specific questions about what was found during the examination. If this information does not answer your questions, please call [place] office at [phone] to clarify.   YOU SHOULD EXPECT: Some feelings of bloating in the abdomen. Passage of more gas than usual. Walking can help get rid of the air that was put into your GI tract during the procedure and reduce the bloating. If you had a lower endoscopy (such as a colonoscopy or flexible sigmoidoscopy) you may notice spotting of blood in your stool or on the toilet paper. Some abdominal soreness may be present for a day or two, also.  DIET: Your first meal following the procedure should be a light meal and then it is ok to progress to your normal diet. A half-sandwich or bowl of soup is an example of a good first meal. Heavy or fried foods are harder to digest and may make you feel nauseous or bloated. Drink plenty of fluids but you should avoid alcoholic beverages for 24 hours. If you had a esophageal dilation, please see attached instructions for diet.    ACTIVITY: Your care partner should take you home directly after the procedure. You should plan to take it easy, moving slowly for the rest of the day. You can resume normal activity the day after the procedure however YOU SHOULD NOT DRIVE, use power tools, machinery or perform tasks that involve climbing or major physical exertion for 24 hours (because of the sedation medicines used during the test).   SYMPTOMS TO REPORT IMMEDIATELY: A gastroenterologist can be reached at any hour. Please call [phone]  for any of the following symptoms:   Following upper endoscopy (EGD, EUS, ERCP, esophageal dilation) Vomiting of blood or coffee ground material  New, significant abdominal pain  New, significant chest pain or pain under the shoulder blades  Painful or  persistently difficult swallowing  New shortness of breath  Black, tarry-looking or red, bloody stools  FOLLOW UP:  If any biopsies were taken you will be contacted by phone or by letter within the next 1-3 weeks. Call [phone]  if you have not heard about the biopsies in 3 weeks.  Please also call with any specific questions about appointments or follow up tests.  

## 2019-10-24 NOTE — Anesthesia Postprocedure Evaluation (Signed)
Anesthesia Post Note  Patient: Robin Welch  Procedure(s) Performed: ESOPHAGOGASTRODUODENOSCOPY (EGD) WITH PROPOFOL (N/A )     Patient location during evaluation: PACU Anesthesia Type: MAC Level of consciousness: awake and alert Pain management: pain level controlled Vital Signs Assessment: post-procedure vital signs reviewed and stable Respiratory status: spontaneous breathing, nonlabored ventilation and respiratory function stable Cardiovascular status: blood pressure returned to baseline and stable Postop Assessment: no apparent nausea or vomiting Anesthetic complications: no    Last Vitals:  Vitals:   10/24/19 1131 10/24/19 1140  BP: 98/69 111/89  Pulse: 89 94  Resp: (!) 25 (!) 24  Temp: 36.6 C   SpO2: 100% 99%    Last Pain:  Vitals:   10/24/19 1140  TempSrc:   PainSc: 0-No pain                 Jarome Matin Garrin Kirwan

## 2019-10-25 ENCOUNTER — Other Ambulatory Visit: Payer: Self-pay

## 2019-10-25 ENCOUNTER — Telehealth: Payer: Self-pay | Admitting: Psychiatry

## 2019-10-25 DIAGNOSIS — F5105 Insomnia due to other mental disorder: Secondary | ICD-10-CM

## 2019-10-25 DIAGNOSIS — F319 Bipolar disorder, unspecified: Secondary | ICD-10-CM

## 2019-10-25 MED ORDER — QUETIAPINE FUMARATE 200 MG PO TABS: ORAL_TABLET | ORAL | 2 refills | Status: DC

## 2019-10-25 NOTE — Telephone Encounter (Signed)
[facility] left message stating need refill for QUETIAPINE 200mg   Fax: [phone]    Phone: [phone].

## 2019-10-25 NOTE — Telephone Encounter (Signed)
Rx for Quetiapine 200 mg 4 at hs submitted to [facility]

## 2019-10-26 ENCOUNTER — Other Ambulatory Visit (HOSPITAL_COMMUNITY): Payer: Self-pay | Admitting: *Deleted

## 2019-10-26 ENCOUNTER — Encounter (HOSPITAL_COMMUNITY): Payer: Medicare HMO

## 2019-10-26 ENCOUNTER — Encounter: Payer: Self-pay | Admitting: *Deleted

## 2019-10-27 ENCOUNTER — Other Ambulatory Visit: Payer: Self-pay

## 2019-10-27 ENCOUNTER — Ambulatory Visit (HOSPITAL_COMMUNITY)
Admission: RE | Admit: 2019-10-27 | Discharge: 2019-10-27 | Disposition: A | Discharge status: Home / Self Care | Payer: Medicare HMO | Source: Ambulatory Visit | Attending: Family Medicine | Admitting: Family Medicine

## 2019-10-27 DIAGNOSIS — D509 Iron deficiency anemia, unspecified: Secondary | ICD-10-CM | POA: Insufficient documentation

## 2019-10-27 MED ORDER — SODIUM CHLORIDE 0.9 % IV SOLN
510.0000 mg | INTRAVENOUS | Status: DC
  Administered 2019-10-27: 510 mg via INTRAVENOUS
  Filled 2019-10-27: qty 17

## 2019-10-30 ENCOUNTER — Ambulatory Visit: Payer: Medicare HMO | Admitting: Addiction (Substance Use Disorder)

## 2019-10-30 ENCOUNTER — Telehealth: Payer: Self-pay | Admitting: Psychiatry

## 2019-10-30 ENCOUNTER — Telehealth: Payer: Self-pay

## 2019-10-30 ENCOUNTER — Telehealth: Payer: Self-pay | Admitting: Gastroenterology

## 2019-10-30 NOTE — Telephone Encounter (Signed)
Pt called to schedule a hospital EGD.  There is a recall for FU, but pt stated that it was discussed to schedule another EGD a month after procedure.

## 2019-10-30 NOTE — Telephone Encounter (Signed)
Patient called and said that she is having severe dirahea and she wants to know if it from the [place]. Please give her a call at [phone]

## 2019-10-30 NOTE — Telephone Encounter (Signed)
belsomra has diarrhea listed as possible side effect - would have her hold this for next 2 wks to see if any benefit.  plz encourage good hydration status - pushing small sips of fluids throughout the day to ensure avoids dehydration. Making good urine is reassuring. If no better, would recommend OV or GI f/u.

## 2019-10-30 NOTE — Telephone Encounter (Signed)
Pt said for 2 or more weeks pt has had severe diarrhea. Pt said in the last 24 hours she has had diarrhea 40 (forty) times. Pt said has upper dull,numblike pain in her entire abd. Pt has fatigue and no other covid symptoms.pt has no dry mouth, no dizziness and pt urinated good amt of light yellow urine within the hour. Pt has not contacted GI. Pt had virtual visit with D Carlean Purl 09/27/19. Pt has new job and has to work on 10/31/19; pt wants to know if any of her meds might be causing diarrhea. UC & ED precautions given and pt voiced understanding.pt request cb.[place].

## 2019-10-31 ENCOUNTER — Other Ambulatory Visit: Payer: Self-pay

## 2019-10-31 DIAGNOSIS — K2101 Gastro-esophageal reflux disease with esophagitis, with bleeding: Secondary | ICD-10-CM

## 2019-10-31 NOTE — Telephone Encounter (Signed)
Called patient. No answer. Left her information on her voicemail.  COVID test 11/17/19 at 11:05 EGD 11/21/19 arrive at 9:15 am  Instructions printed and mailed. Available in her My Chart also.

## 2019-10-31 NOTE — Telephone Encounter (Signed)
Left message for her to call back and discuss.

## 2019-10-31 NOTE — Telephone Encounter (Signed)
Pt returning call.  I relayed Dr. Synthia Innocent message.  Pt verbalizes understanding and states she's actually doing better today.

## 2019-10-31 NOTE — Telephone Encounter (Signed)
Lvm asking pt to call back.  Need to relay Dr. G's message.  

## 2019-11-03 ENCOUNTER — Other Ambulatory Visit: Payer: Self-pay

## 2019-11-03 ENCOUNTER — Ambulatory Visit (HOSPITAL_COMMUNITY)
Admission: RE | Admit: 2019-11-03 | Discharge: 2019-11-03 | Disposition: A | Discharge status: Home / Self Care | Payer: Medicare HMO | Source: Ambulatory Visit | Attending: Family Medicine | Admitting: Family Medicine

## 2019-11-03 ENCOUNTER — Encounter (HOSPITAL_COMMUNITY): Payer: Medicare HMO

## 2019-11-03 DIAGNOSIS — D509 Iron deficiency anemia, unspecified: Secondary | ICD-10-CM | POA: Diagnosis not present

## 2019-11-03 MED ORDER — SODIUM CHLORIDE 0.9 % IV SOLN
510.0000 mg | INTRAVENOUS | Status: DC
  Administered 2019-11-03: 510 mg via INTRAVENOUS
  Filled 2019-11-03: qty 510

## 2019-11-06 ENCOUNTER — Other Ambulatory Visit: Payer: Self-pay | Admitting: Psychiatry

## 2019-11-06 ENCOUNTER — Ambulatory Visit: Payer: Medicare HMO | Admitting: Addiction (Substance Use Disorder)

## 2019-11-06 ENCOUNTER — Other Ambulatory Visit: Payer: Self-pay

## 2019-11-06 DIAGNOSIS — F319 Bipolar disorder, unspecified: Secondary | ICD-10-CM

## 2019-11-06 DIAGNOSIS — F5105 Insomnia due to other mental disorder: Secondary | ICD-10-CM

## 2019-11-06 MED ORDER — QUETIAPINE FUMARATE 200 MG PO TABS: ORAL_TABLET | ORAL | 2 refills | Status: DC

## 2019-11-06 MED ORDER — CARIPRAZINE HCL 3 MG PO CAPS: 3.0000 mg | ORAL_CAPSULE | Freq: Every day | ORAL | 1 refills | Status: DC

## 2019-11-06 NOTE — Telephone Encounter (Signed)
PT has increased her Vraylar to 2 pills day. Took 1.5mg  and that wasn't helping, Is now taking 3.0mg  . It works Engineer, manufacturing. Can we call in a new RX to [facility] [facility]?

## 2019-11-09 ENCOUNTER — Ambulatory Visit: Payer: Medicare HMO | Admitting: Psychiatry

## 2019-11-10 ENCOUNTER — Ambulatory Visit: Payer: Medicare HMO | Admitting: Psychiatry

## 2019-11-13 ENCOUNTER — Ambulatory Visit: Payer: Medicare HMO | Admitting: Psychiatry

## 2019-11-13 ENCOUNTER — Ambulatory Visit: Payer: Medicare HMO | Admitting: Addiction (Substance Use Disorder)

## 2019-11-13 ENCOUNTER — Telehealth: Payer: Self-pay

## 2019-11-13 DIAGNOSIS — E538 Deficiency of other specified B group vitamins: Secondary | ICD-10-CM

## 2019-11-13 DIAGNOSIS — S42201A Unspecified fracture of upper end of right humerus, initial encounter for closed fracture: Secondary | ICD-10-CM

## 2019-11-13 DIAGNOSIS — M8000XG Age-related osteoporosis with current pathological fracture, unspecified site, subsequent encounter for fracture with delayed healing: Secondary | ICD-10-CM

## 2019-11-13 DIAGNOSIS — D500 Iron deficiency anemia secondary to blood loss (chronic): Secondary | ICD-10-CM

## 2019-11-13 NOTE — Telephone Encounter (Signed)
Per pt she is still having dental work done.  Will f/u with pt in 3 months to see check status of dental work.

## 2019-11-14 NOTE — Telephone Encounter (Signed)
Pt stated that she can only have covid test 11/17/19 in the afternoon after 2:30 pm.

## 2019-11-14 NOTE — Telephone Encounter (Signed)
The pt COVID test will remain at 1105 am on 3/19.  She has an appt at 9 am and was advised to go after that appt and if she is a little late that should be fine.  The pt has been advised of the information and verbalized understanding.

## 2019-11-15 DIAGNOSIS — H5203 Hypermetropia, bilateral: Secondary | ICD-10-CM | POA: Diagnosis not present

## 2019-11-15 DIAGNOSIS — H524 Presbyopia: Secondary | ICD-10-CM | POA: Diagnosis not present

## 2019-11-15 NOTE — Telephone Encounter (Signed)
Placed a remind me to check on pt in 3 months.

## 2019-11-17 ENCOUNTER — Other Ambulatory Visit (HOSPITAL_COMMUNITY)
Admission: RE | Admit: 2019-11-17 | Discharge: 2019-11-17 | Disposition: A | Discharge status: Home / Self Care | Payer: Medicare HMO | Source: Ambulatory Visit | Attending: Gastroenterology | Admitting: Gastroenterology

## 2019-11-17 DIAGNOSIS — Z01812 Encounter for preprocedural laboratory examination: Secondary | ICD-10-CM | POA: Insufficient documentation

## 2019-11-17 DIAGNOSIS — S52531K Colles' fracture of right radius, subsequent encounter for closed fracture with nonunion: Secondary | ICD-10-CM | POA: Diagnosis not present

## 2019-11-17 DIAGNOSIS — Z20822 Contact with and (suspected) exposure to covid-19: Secondary | ICD-10-CM | POA: Insufficient documentation

## 2019-11-17 DIAGNOSIS — M25511 Pain in right shoulder: Secondary | ICD-10-CM | POA: Diagnosis not present

## 2019-11-17 LAB — SARS CORONAVIRUS 2 (TAT 6-24 HRS): SARS Coronavirus 2: NEGATIVE

## 2019-11-20 ENCOUNTER — Encounter: Payer: Self-pay | Admitting: Psychiatry

## 2019-11-20 ENCOUNTER — Other Ambulatory Visit: Payer: Self-pay

## 2019-11-20 ENCOUNTER — Ambulatory Visit (INDEPENDENT_AMBULATORY_CARE_PROVIDER_SITE_OTHER): Payer: Medicare HMO | Admitting: Psychiatry

## 2019-11-20 VITALS — Wt 110.0 lb

## 2019-11-20 DIAGNOSIS — F5105 Insomnia due to other mental disorder: Secondary | ICD-10-CM

## 2019-11-20 DIAGNOSIS — F319 Bipolar disorder, unspecified: Secondary | ICD-10-CM | POA: Diagnosis not present

## 2019-11-20 DIAGNOSIS — F411 Generalized anxiety disorder: Secondary | ICD-10-CM

## 2019-11-20 MED ORDER — BELSOMRA 15 MG PO TABS: 15.0000 mg | ORAL_TABLET | Freq: Every day | ORAL | 0 refills | Status: DC

## 2019-11-20 NOTE — Progress Notes (Signed)
Robin Welch [id number] 22-Aug-1966 54 y.o.  Subjective:   Patient ID:  Robin Welch is a 54 y.o. (DOB 03/08/1966) female.  Chief Complaint:  Chief Complaint  Patient presents with  . Follow-up    h/o Mood, anxiety, and insomnia    HPI Robin Welch presents to the office today for follow-up of mood and anxiety. "I am so good.... I'm motivated and interested in things." She reports that family members have commented that she has had a significant improvement in mood. "Mood has been great." Denies irritability She denies elevated mood or impulsive behavior. Energy and motivation have been good. Reports that she has been more productive and staying on top of chores and when her appointments are. She has been having swallowing difficulties and is on a liquid diet. Has endoscopy tomorrow. Weight has been stable. She reports that she has been able to concentrate and focus on things in her new job. Reports her memory has improved. Sleeping well. Denies SI.   She reports that she is now working part-time in [employer], [employer] and is enjoying this. Continues to use alpha stim.  She reports that she felt Vraylar 1.5 mg was not as effective for her as the 3 mg.   Denies any cravings for substance use.   Approved for [place] Patient Assistance.   Past Psychiatric Medication Trials: Xanax- Misused Klonopin Seroquel- Helpful for sleep and anxiety.  Olanzapine Geodon Abilify Lithium Lamictal Depakote Buspar Hydroxyzine Gabapentin Prozac- Hives, edema Celexa- Possible allergic reaction Paxil Wellbutrin XL Sonata Ambien Zolpimist Lunesta Trazodone  AIMS     Office Visit from 11/20/2019 in [place] Visit from 10/13/2019 in [place] Total Score  0  0    PHQ2-9     Office Visit from 06/21/2017 in [place] at [facility] Visit from 12/07/2014 in [place] at [facility] Total Score  0  2       Review of  Systems:  Review of Systems  HENT: Positive for trouble swallowing.   Gastrointestinal: Positive for diarrhea.  Musculoskeletal: Negative for gait problem.  Neurological: Negative for tremors.  Hematological:       Has had 2 iron infusions  Psychiatric/Behavioral:       Please refer to HPI    Medications: I have reviewed the patient's current medications.  Current Outpatient Medications  Medication Sig Dispense Refill  . Bismuth Subsalicylate (KAOPECTATE PO) Take by mouth.    Marland Kitchen CALCIUM PO Take 1 tablet by mouth daily.    . cariprazine (VRAYLAR) capsule Take 1 capsule (3 mg total) by mouth daily. 30 capsule 1  . Cholecalciferol (VITAMIN D3 PO) Take 1 capsule by mouth daily.    Marland Kitchen esomeprazole (NEXIUM) 40 MG packet Take 40 mg by mouth 2 (two) times daily at 8 am and 10 pm. 60 each 12  . Multiple Vitamin (MULTIVITAMIN WITH MINERALS) TABS tablet Take 1 tablet by mouth daily.    . QUEtiapine (SEROQUEL) 200 MG tablet TAKE 4 TABLETS(800 MG) BY MOUTH AT BEDTIME (Patient taking differently: Take 800 mg by mouth at bedtime. ) 120 tablet 2  . sucralfate (CARAFATE) 1 GM/10ML suspension Take 10 mLs (1 g total) by mouth 4 (four) times daily. Take before meals and at bedtime (Patient taking differently: Take 1 g by mouth 4 (four) times daily. ) 420 mL 3  . [START ON 12/05/2019] Suvorexant (BELSOMRA) 15 MG TABS Take 15 mg by mouth at bedtime. 90 tablet 0  .  albuterol (VENTOLIN HFA) 108 (90 Base) MCG/ACT inhaler INHALE 2 PUFFS BY MOUTH EVERY 6 HOURS AS NEEDED FOR WHEEZING OR SHORTNESS OF BREATH (Patient not taking: No sig reported) 54 g 0  . calcium carbonate (CALCIUM 600) 600 MG TABS tablet Take 1 tablet (600 mg total) by mouth daily with breakfast. (Patient not taking: Reported on 11/15/2019) 3 tablet   . Cholecalciferol (VITAMIN D) 50 MCG (2000 UT) CAPS Take 1 capsule (2,000 Units total) by mouth daily. (Patient not taking: Reported on 11/15/2019)    . ondansetron (ZOFRAN-ODT) 4 MG disintegrating tablet  Take 1 tablet (4 mg total) by mouth every 8 (eight) hours as needed for nausea or vomiting. (Patient not taking: Reported on 10/17/2019) 20 tablet 2  . Tiotropium Bromide Monohydrate (SPIRIVA RESPIMAT) 2.5 MCG/ACT AERS Inhale 1 puff into the lungs daily. (Patient not taking: Reported on 10/17/2019) 4 g 6   No current facility-administered medications for this visit.    Medication Side Effects: None  Allergies:  Allergies  Allergen Reactions  . Prozac [Fluoxetine Hcl] Swelling and Rash  . Doxycycline Nausea And Vomiting  . Celexa [Citalopram Hydrobromide] Other (See Comments)    Possible allergic reaction  . Chantix [Varenicline Tartrate] Swelling    Mouth swelling  . Hydroxyzine Other (See Comments)    Not effective  . Klonopin [Clonazepam] Other (See Comments)    Caused instablility. Not able to drive due to over medicated  . Lunesta [Eszopiclone] Other (See Comments)    Not effective  . Sonata [Zaleplon] Other (See Comments)    Not effective  . Trazodone And Nefazodone Other (See Comments)    ineffective  . Zolpimist [Zolpidem Tartrate] Other (See Comments)    Not effective  . Azithromycin Other (See Comments)    Reaction not recalled by the patient    Past Medical History:  Diagnosis Date  . Anemia   . Anxiety   . Barrett esophagus   . Bipolar 1 disorder [facility])    sees Dr. Candis Schatz psych [phone])  . Closed fracture distal radius and ulna, left, initial encounter 04/20/2019   S/p ORIF 04/2019 Jeannie Fend)   . COPD (chronic obstructive pulmonary disease) ([place]) 03/2013   hyperinflation by CXR  . Elevated alkaline phosphatase level   . Esophagitis   . Hiatal hernia   . History of cocaine dependence ([place]) latest 2020   undergoing detox  . History of stomach ulcers   . Hx: UTI (urinary tract infection)   . Migraine    "stopped in ~ 2008" (03/11/2017)  . Muscle strain of gluteal region, right, subsequent encounter 10/05/2018   By MRI 2020  . Osteoporosis 06/09/2018    DEXA 05/2017 - T score -2.5 L hip, -1.3 spine  . Positive PPD 1989   s/p CXR and INH 6 mo  . Seizure ([place]) X 1   "from takiing too many headache medicine?" (03/11/2017)  . Smoker     Family History  Problem Relation Age of Onset  . Diabetes Mother        T2DM  . Breast cancer Mother 67  . Dementia Mother   . Lymphoma Mother   . Coronary artery disease Father 46       MI  . Anxiety disorder Paternal Aunt   . Anxiety disorder Cousin   . Depression Cousin   . ADD / ADHD Child   . Anxiety disorder Child   . Depression Child   . Stroke Neg Hx   . Colon cancer Neg Hx   .  Cancer - Colon Neg Hx   . Esophageal cancer Neg Hx   . Stomach cancer Neg Hx   . Rectal cancer Neg Hx     Social History   Socioeconomic History  . Marital status: Divorced    Spouse name: Not on file  . Number of children: 2  . Years of education: bachelors  . Highest education level: Not on file  Occupational History  . 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 Smoking status: Current Every Day Smoker    Packs/day: 1.00    Years: 36.00    Pack years: 36.00    Types: Cigarettes  . Smokeless tobacco: Never Used  Substance and Sexual Activity  . Alcohol use: Not Currently    Alcohol/week: 0.0 standard drinks    Comment: 03/11/2017 "stopped 09/18/2015; never an alcoholic"  . Drug use: Yes    Types: Cocaine    Comment: not currently  . Sexual activity: Never    Birth control/protection: Surgical  Other Topics Concern  . Not on file  Social History Narrative   Caffeine: 40 oz diet mountain dew/day   Lives with husband and 2 children, no pets      Social Determinants of Health   Financial Resource Strain:   . Difficulty of Paying Living Expenses:   Food Insecurity:   . Worried About Charity fundraiser in the Last Year:   . Arboriculturist in the Last Year:   Transportation Needs:   . Lexicographer (Medical):   Marland Kitchen Lack of Transportation (Non-Medical):   Physical Activity:   . Days of Exercise per Week:   . Minutes of Exercise per Session:   Stress:   . Feeling of Stress :   Social Connections:   . Frequency of Communication with Friends and Family:   . Frequency of Social Gatherings with Friends and Family:   . Attends Religious Services:   . Active Member of Clubs or Organizations:   . Attends Archivist Meetings:   Marland Kitchen Marital Status:   Intimate Partner Violence:   . Fear of Current or Ex-Partner:   . Emotionally Abused:   Marland Kitchen Physically Abused:   . Sexually Abused:     Past Medical History, Surgical history, Social history, and Family history were reviewed and updated as appropriate.   Please see review of systems for further details on the patient's review from today.   Objective:   Physical Exam:  Wt 110 lb (49.9 kg)   LMP 04/23/2015 (Approximate)   BMI 20.12 kg/m   Physical Exam Constitutional:      General: She is not in acute distress. Musculoskeletal:        General: No deformity.  Neurological:     Mental Status: She is alert and oriented to person, place, and time.     Coordination: Coordination normal.  Psychiatric:        Attention and Perception: Attention and perception normal. She does not perceive auditory or visual hallucinations.        Mood and Affect: Mood normal. Mood is not anxious or depressed. Affect is not labile, blunt, angry or inappropriate.        Speech: Speech normal.        Behavior: Behavior normal.        Thought Content: Thought content normal. Thought content is not paranoid or delusional. Thought  content does not include homicidal or suicidal ideation. Thought content does not include homicidal or suicidal plan.        Cognition and Memory: Cognition and memory normal.        Judgment: Judgment normal.     Comments: Insight intact     Lab Review:     Component Value Date/Time   NA 129 (L)  10/09/2019 1225   K 3.8 10/09/2019 1225   CL 94 (L) 10/09/2019 1225   CO2 26 10/09/2019 1225   GLUCOSE 125 (H) 10/09/2019 1225   BUN 10 10/09/2019 1225   CREATININE 0.55 10/09/2019 1225   CALCIUM 8.9 10/09/2019 1225   PROT 3.8 (L) 07/20/2019 0311   ALBUMIN 1.3 (L) 07/20/2019 0311   AST 15 07/20/2019 0311   ALT 23 07/20/2019 0311   ALKPHOS 202 (H) 07/20/2019 0311   BILITOT 0.6 07/20/2019 0311   GFRNONAA >60 07/20/2019 0311   GFRAA >60 07/20/2019 0311       Component Value Date/Time   WBC 9.9 10/09/2019 1219   RBC 3.59 (L) 10/09/2019 1219   HGB 10.6 (L) 10/09/2019 1219   HGB 10.8 (L) 07/11/2018 1050   HCT 31.2 (L) 10/09/2019 1219   PLT 737.0 (H) 10/09/2019 1219   PLT 547 (H) 07/11/2018 1050   MCV 87.1 10/09/2019 1219   MCH 29.0 07/20/2019 0311   MCHC 33.9 10/09/2019 1219   RDW 14.2 10/09/2019 1219   LYMPHSABS 2.1 10/09/2019 1219   MONOABS 0.5 10/09/2019 1219   EOSABS 0.5 10/09/2019 1219   BASOSABS 0.1 10/09/2019 1219    Lithium Lvl  Date Value Ref Range Status  12/27/2010 1.43 (H) 0.80 - 1.40 mEq/L Final     No results found for: PHENYTOIN, PHENOBARB, VALPROATE, CBMZ   .res Assessment: Plan:   Continue Vraylar 3 mg po qd for mood s/s (Pt has been approved for Pt Assistance).  Continue Seroquel 800 mg po QHS for mood and insomnia. May consider dose reduction in the future.  Continue Belsomra 15 mg po QHS for insomnia.  Pt to f/u in 3 months or sooner if clinically indicated.  Patient advised to contact office with any questions, adverse effects, or acute worsening in signs and symptoms.  Robin Welch was seen today for follow-up.  Diagnoses and all orders for this visit:  Bipolar I disorder ([place])  Insomnia due to mental disorder -     Suvorexant (BELSOMRA) 15 MG TABS; Take 15 mg by mouth at bedtime.  Generalized anxiety disorder     Please see After Visit Summary for patient specific instructions.  No future appointments.  No orders of the defined types were  placed in this encounter.   -------------------------------

## 2019-11-20 NOTE — Progress Notes (Signed)
   11/20/19 0955  Facial and Oral Movements  Muscles of Facial Expression 0  Lips and Perioral Area 0  Jaw 0  Tongue 0  Extremity Movements  Upper (arms, wrists, hands, fingers) 0  Lower (legs, knees, ankles, toes) 0  Trunk Movements  Neck, shoulders, hips 0  Overall Severity  Severity of abnormal movements (highest score from questions above) 0  Incapacitation due to abnormal movements 0  Patient's awareness of abnormal movements (rate only patient's report) 0  Dental Status  Current problems with teeth and/or dentures? Yes  Does patient usually wear dentures? No  AIMS Total Score  AIMS Total Score 0

## 2019-11-21 ENCOUNTER — Encounter (HOSPITAL_COMMUNITY)
Admission: RE | Disposition: A | Discharge status: Home / Self Care | Payer: Self-pay | Source: Home / Self Care | Attending: Gastroenterology

## 2019-11-21 ENCOUNTER — Ambulatory Visit (HOSPITAL_COMMUNITY)
Admission: RE | Admit: 2019-11-21 | Discharge: 2019-11-21 | Disposition: A | Discharge status: Home / Self Care | Payer: Medicare HMO | Attending: Gastroenterology | Admitting: Gastroenterology

## 2019-11-21 ENCOUNTER — Encounter (HOSPITAL_COMMUNITY): Payer: Self-pay | Admitting: Gastroenterology

## 2019-11-21 ENCOUNTER — Ambulatory Visit (HOSPITAL_COMMUNITY): Payer: Medicare HMO | Admitting: Anesthesiology

## 2019-11-21 DIAGNOSIS — Z881 Allergy status to other antibiotic agents status: Secondary | ICD-10-CM | POA: Diagnosis not present

## 2019-11-21 DIAGNOSIS — K2101 Gastro-esophageal reflux disease with esophagitis, with bleeding: Secondary | ICD-10-CM

## 2019-11-21 DIAGNOSIS — K222 Esophageal obstruction: Secondary | ICD-10-CM

## 2019-11-21 DIAGNOSIS — K449 Diaphragmatic hernia without obstruction or gangrene: Secondary | ICD-10-CM | POA: Insufficient documentation

## 2019-11-21 DIAGNOSIS — F319 Bipolar disorder, unspecified: Secondary | ICD-10-CM | POA: Diagnosis not present

## 2019-11-21 DIAGNOSIS — Z79899 Other long term (current) drug therapy: Secondary | ICD-10-CM | POA: Diagnosis not present

## 2019-11-21 DIAGNOSIS — R1310 Dysphagia, unspecified: Secondary | ICD-10-CM

## 2019-11-21 DIAGNOSIS — J449 Chronic obstructive pulmonary disease, unspecified: Secondary | ICD-10-CM | POA: Diagnosis not present

## 2019-11-21 DIAGNOSIS — K2100 Gastro-esophageal reflux disease with esophagitis, without bleeding: Secondary | ICD-10-CM | POA: Diagnosis not present

## 2019-11-21 DIAGNOSIS — Z888 Allergy status to other drugs, medicaments and biological substances status: Secondary | ICD-10-CM | POA: Diagnosis not present

## 2019-11-21 DIAGNOSIS — K2211 Ulcer of esophagus with bleeding: Secondary | ICD-10-CM

## 2019-11-21 DIAGNOSIS — F17210 Nicotine dependence, cigarettes, uncomplicated: Secondary | ICD-10-CM | POA: Insufficient documentation

## 2019-11-21 DIAGNOSIS — K2210 Ulcer of esophagus without bleeding: Secondary | ICD-10-CM | POA: Diagnosis not present

## 2019-11-21 DIAGNOSIS — Z8719 Personal history of other diseases of the digestive system: Secondary | ICD-10-CM | POA: Insufficient documentation

## 2019-11-21 HISTORY — PX: ESOPHAGOGASTRODUODENOSCOPY (EGD) WITH PROPOFOL: SHX5813

## 2019-11-21 SURGERY — ESOPHAGOGASTRODUODENOSCOPY (EGD) WITH PROPOFOL
Anesthesia: Monitor Anesthesia Care

## 2019-11-21 MED ORDER — OMEPRAZOLE 40 MG PO CPDR: 40.0000 mg | DELAYED_RELEASE_CAPSULE | Freq: Every day | ORAL | 11 refills | Status: DC

## 2019-11-21 MED ORDER — ONDANSETRON HCL 4 MG/2ML IJ SOLN
INTRAMUSCULAR | Status: DC | PRN
  Administered 2019-11-21: 4 mg via INTRAVENOUS

## 2019-11-21 MED ORDER — SUCRALFATE 1 G PO TABS: 1.0000 g | ORAL_TABLET | Freq: Four times a day (QID) | ORAL | 1 refills | Status: DC

## 2019-11-21 MED ORDER — ESOMEPRAZOLE MAGNESIUM 40 MG PO PACK: 40.0000 mg | PACK | Freq: Two times a day (BID) | ORAL | 11 refills | Status: DC

## 2019-11-21 MED ORDER — CHOLESTYRAMINE LIGHT 4 G PO PACK: 2.0000 g | PACK | Freq: Two times a day (BID) | ORAL | 3 refills | Status: DC

## 2019-11-21 MED ORDER — PROPOFOL 500 MG/50ML IV EMUL
INTRAVENOUS | Status: DC | PRN
  Administered 2019-11-21: 50 mg via INTRAVENOUS
  Administered 2019-11-21: 20 mg via INTRAVENOUS

## 2019-11-21 MED ORDER — LACTATED RINGERS IV SOLN
INTRAVENOUS | Status: DC
  Administered 2019-11-21: 10:00:00 1000 mL via INTRAVENOUS

## 2019-11-21 MED ORDER — PROPOFOL 500 MG/50ML IV EMUL
INTRAVENOUS | Status: DC | PRN
  Administered 2019-11-21: 135 ug/kg/min via INTRAVENOUS

## 2019-11-21 MED ORDER — SODIUM CHLORIDE 0.9 % IV SOLN: INTRAVENOUS | Status: DC

## 2019-11-21 SURGICAL SUPPLY — 15 items

## 2019-11-21 NOTE — H&P (Signed)
[facility] History and Physical   Primary Care Physician:  Ria Bush, MD   Reason for Procedure:  Esophageal stricture, severe esophagitis, dysphagia  Plan:    EGD with esophageal biopsies and esophageal dilation     HPI: Robin Welch is a 54 y.o. female with severe erosive esophagitis, esophageal ulceration secondary to uncontrolled GERD, peptic high-grade stricture with solid dysphagia here for repeat EGD to evaluate the mucosa for healing, exclude any underlying neoplastic lesion and serial esophageal dilation   Past Medical History:  Diagnosis Date  . Anemia   . Anxiety   . Barrett esophagus   . Bipolar 1 disorder [facility])    sees Dr. Candis Schatz psych [phone])  . Closed fracture distal radius and ulna, left, initial encounter 04/20/2019   S/p ORIF 04/2019 Jeannie Fend)   . COPD (chronic obstructive pulmonary disease) ([place]) 03/2013   hyperinflation by CXR  . Elevated alkaline phosphatase level   . Esophagitis   . Hiatal hernia   . History of cocaine dependence ([place]) latest 2020   undergoing detox  . History of stomach ulcers   . Hx: UTI (urinary tract infection)   . Migraine    "stopped in ~ 2008" (03/11/2017)  . Muscle strain of gluteal region, right, subsequent encounter 10/05/2018   By MRI 2020  . Osteoporosis 06/09/2018   DEXA 05/2017 - T score -2.5 L hip, -1.3 spine  . Positive PPD 1989   s/p CXR and INH 6 mo  . Seizure ([place]) X 1   "from takiing too many headache medicine?" (03/11/2017)  . Smoker     Past Surgical History:  Procedure Laterality Date  . BIOPSY  07/19/2019   Procedure: BIOPSY;  Surgeon: Gatha Mayer, MD;  Location: [facility];  Service: Endoscopy;;  . COLONOSCOPY  07/2018   poor colon prep - rec repeat (Kameron Blethen)  . DILATION AND CURETTAGE OF UTERUS  X 1   S/P miscarriage  . ESOPHAGOGASTRODUODENOSCOPY  03/2017   candidal esophagitis, treated, LA grade D esophagitis, 4cm HH (Aylanie Cubillos)  . ESOPHAGOGASTRODUODENOSCOPY  07/2018    severe reflux esophagitis, neg candida (Enes Wegener)  . ESOPHAGOGASTRODUODENOSCOPY (EGD) WITH PROPOFOL N/A 07/19/2019   Procedure: ESOPHAGOGASTRODUODENOSCOPY (EGD) WITH PROPOFOL;  Surgeon: Gatha Mayer, MD;  Location: [facility];  Service: Endoscopy;  Laterality: N/A;  . ESOPHAGOGASTRODUODENOSCOPY (EGD) WITH PROPOFOL N/A 10/24/2019   Procedure: ESOPHAGOGASTRODUODENOSCOPY (EGD) WITH PROPOFOL;  Surgeon: Mauri Pole, MD;  Location: [facility];  Service: Endoscopy;  Laterality: N/A;  . LAPAROSCOPIC CHOLECYSTECTOMY  2006  . ORIF WRIST FRACTURE Right 04/08/2019   Procedure: OPEN REDUCTION INTERNAL FIXATION (ORIF) DISTAL RADIUS/ULNA FRACTURE;  Surgeon: Verner Mould, MD;  Location: [place];  Service: Orthopedics;  Laterality: Right;  . TUBAL LIGATION  2003    Prior to Admission medications   Medication Sig Start Date End Date Taking? Authorizing Provider  CALCIUM PO Take 1 tablet by mouth daily.   Yes [provider]  cariprazine (VRAYLAR) capsule Take 1 capsule (3 mg total) by mouth daily. 11/06/19  Yes Thayer Headings, PMHNP  Cholecalciferol (VITAMIN D3 PO) Take 1 capsule by mouth daily.   Yes [provider]  esomeprazole (NEXIUM) 40 MG packet Take 40 mg by mouth 2 (two) times daily at 8 am and 10 pm. 09/29/19  Yes Celso Granja, Venia Minks, MD  Multiple Vitamin (MULTIVITAMIN WITH MINERALS) TABS tablet Take 1 tablet by mouth daily.   Yes [provider]  QUEtiapine (SEROQUEL) 200 MG tablet TAKE 4 TABLETS(800 MG) BY MOUTH  AT BEDTIME Patient taking differently: Take 800 mg by mouth at bedtime.  11/06/19  Yes Thayer Headings, PMHNP  sucralfate (CARAFATE) 1 GM/10ML suspension Take 10 mLs (1 g total) by mouth 4 (four) times daily. Take before meals and at bedtime Patient taking differently: Take 1 g by mouth 4 (four) times daily.  09/29/19  Yes Zenon Leaf, Venia Minks, MD  albuterol (VENTOLIN HFA) 108 (90 Base) MCG/ACT inhaler INHALE 2 PUFFS BY MOUTH EVERY 6 HOURS AS NEEDED  FOR WHEEZING OR SHORTNESS OF BREATH Patient not taking: No sig reported 08/15/19   Ria Bush, MD  Bismuth Subsalicylate (KAOPECTATE PO) Take by mouth.    [provider]  calcium carbonate (CALCIUM 600) 600 MG TABS tablet Take 1 tablet (600 mg total) by mouth daily with breakfast. Patient not taking: Reported on 11/15/2019 07/08/19   Ria Bush, MD  Cholecalciferol (VITAMIN D) 50 MCG (2000 UT) CAPS Take 1 capsule (2,000 Units total) by mouth daily. Patient not taking: Reported on 11/15/2019 07/06/19   Ria Bush, MD  ondansetron (ZOFRAN-ODT) 4 MG disintegrating tablet Take 1 tablet (4 mg total) by mouth every 8 (eight) hours as needed for nausea or vomiting. Patient not taking: Reported on 10/17/2019 09/12/19   Ria Bush, MD  Suvorexant (BELSOMRA) 15 MG TABS Take 15 mg by mouth at bedtime. 12/05/19   Thayer Headings, PMHNP  Tiotropium Bromide Monohydrate (SPIRIVA RESPIMAT) 2.5 MCG/ACT AERS Inhale 1 puff into the lungs daily. Patient not taking: Reported on 10/17/2019 07/06/19   Ria Bush, MD    No current facility-administered medications for this encounter.    Allergies as of 10/31/2019 - Review Complete 10/27/2019  Allergen Reaction Noted  . Prozac [fluoxetine hcl] Swelling and Rash 12/18/2010  . Doxycycline Nausea And Vomiting 10/31/2014  . Celexa [citalopram hydrobromide] Other (See Comments) 05/06/2018  . Chantix [varenicline tartrate] Swelling 03/28/2018  . Hydroxyzine Other (See Comments) 05/06/2018  . Klonopin [clonazepam] Other (See Comments) 05/06/2018  . Lunesta [eszopiclone] Other (See Comments) 05/06/2018  . Prozac [fluoxetine hcl] Swelling 06/21/2019  . Sonata [zaleplon] Other (See Comments) 05/06/2018  . Trazodone and nefazodone Other (See Comments) 05/06/2018  . Zolpimist [zolpidem tartrate] Other (See Comments) 05/06/2018  . Azithromycin Other (See Comments) 04/03/2015    Family History  Problem Relation Age of Onset  . Diabetes  Mother        T2DM  . Breast cancer Mother 33  . Dementia Mother   . Lymphoma Mother   . Coronary artery disease Father 22       MI  . Anxiety disorder Paternal Aunt   . Anxiety disorder Cousin   . Depression Cousin   . ADD / ADHD Child   . Anxiety disorder Child   . Depression Child   . Stroke Neg Hx   . Colon cancer Neg Hx   . Cancer - Colon Neg Hx   . Esophageal cancer Neg Hx   . Stomach cancer Neg Hx   . Rectal cancer Neg Hx     Social History   Socioeconomic History  . Marital status: Divorced    Spouse name: Not on file  . Number of children: 2  . Years of education: bachelors  . Highest education level: Not on file  Occupational History  . 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 Smoking status: Current Every Day Smoker    Packs/day: 1.00    Years:  36.00    Pack years: 36.00    Types: Cigarettes  . Smokeless tobacco: Never Used  Substance and Sexual Activity  . Alcohol use: Not Currently    Alcohol/week: 0.0 standard drinks    Comment: 03/11/2017 "stopped 09/18/2015; never an alcoholic"  . Drug use: Yes    Types: Cocaine    Comment: not currently  . Sexual activity: Never    Birth control/protection: Surgical  Other Topics Concern  . Not on file  Social History Narrative   Caffeine: 40 oz diet mountain dew/day   Lives with husband and 2 children, no pets      Social Determinants of Health   Financial Resource Strain:   . Difficulty of Paying Living Expenses:   Food Insecurity:   . Worried About Charity fundraiser in the Last Year:   . Arboriculturist in the Last Year:   Transportation Needs:   . Film/video editor (Medical):   Marland Kitchen Lack of Transportation (Non-Medical):   Physical Activity:   . Days of Exercise per Week:   . Minutes of Exercise per Session:   Stress:   . Feeling of Stress :   Social Connections:   . Frequency of Communication with  Friends and Family:   . Frequency of Social Gatherings with Friends and Family:   . Attends Religious Services:   . Active Member of Clubs or Organizations:   . Attends Archivist Meetings:   Marland Kitchen Marital Status:   Intimate Partner Violence:   . Fear of Current or Ex-Partner:   . Emotionally Abused:   Marland Kitchen Physically Abused:   . Sexually Abused:     Review of Systems:  All other review of systems negative except as mentioned in the HPI.  Physical Exam: Vital signs in last 24 hours:     General:   Alert,   pleasant and cooperative in NAD Lungs:  Clear throughout to auscultation.   Heart:  Regular rate and rhythm; no murmurs, clicks, rubs,  or gallops. Abdomen:  Soft, nontender and nondistended. Normal bowel sounds.   Neuro/Psych:  Alert and cooperative. Normal mood and affect. A and O x 3   K. Denzil Magnuson , MD [phone]

## 2019-11-21 NOTE — Discharge Instructions (Signed)

## 2019-11-21 NOTE — Transfer of Care (Signed)
Immediate Anesthesia Transfer of Care Note  Patient: Robin Welch  Procedure(s) Performed: ESOPHAGOGASTRODUODENOSCOPY (EGD) WITH PROPOFOL (N/A ) ESOPHAGEAL DILITATION (N/A )  Patient Location: PACU  Anesthesia Type:MAC  Level of Consciousness: sedated  Airway & Oxygen Therapy: Patient Spontanous Breathing and Patient connected to face mask oxygen  Post-op Assessment: Report given to RN and Post -op Vital signs reviewed and stable  Post vital signs: Reviewed and stable  Last Vitals:  Vitals Value Taken Time  BP    Temp    Pulse    Resp    SpO2      Last Pain:  Vitals:   11/21/19 0954  TempSrc: Oral  PainSc: 0-No pain         Complications: No apparent anesthesia complications

## 2019-11-21 NOTE — Anesthesia Preprocedure Evaluation (Addendum)
Anesthesia Evaluation  Patient identified by MRN, date of birth, ID band Patient awake    Reviewed: Allergy & Precautions, NPO status , Patient's Chart, lab work & pertinent test results  History of Anesthesia Complications Negative for: history of anesthetic complications  Airway Mallampati: I  TM Distance: >3 FB Neck ROM: Full    Dental no notable dental hx. (+) Dental Advisory Given, Teeth Intact   Pulmonary COPD, Current SmokerPatient did not abstain from smoking.,  2ppd x 35 years  No inhalers   Pulmonary exam normal breath sounds clear to auscultation       Cardiovascular negative cardio ROS Normal cardiovascular exam Rhythm:Regular Rate:Normal     Neuro/Psych  Headaches, PSYCHIATRIC DISORDERS Anxiety Bipolar Disorder    GI/Hepatic hiatal hernia, PUD, GERD  Medicated and Controlled,(+)     substance abuse  cocaine use, Hx of cocaine use- nothing currently Asymptomatic GERD Has history of many gastric and esophageal ulcers in past     Endo/Other  negative endocrine ROS  Renal/GU negative Renal ROS  negative genitourinary   Musculoskeletal negative musculoskeletal ROS (+)   Abdominal Normal abdominal exam  (+)   Peds  Hematology negative hematology ROS (+) anemia ,   Anesthesia Other Findings Day of surgery medications reviewed with the patient.  Reproductive/Obstetrics negative OB ROS                            Anesthesia Physical Anesthesia Plan  ASA: III  Anesthesia Plan: MAC   Post-op Pain Management:    Induction:   PONV Risk Score and Plan: 2 and Propofol infusion and TIVA  Airway Management Planned: Natural Airway and Simple Face Mask  Additional Equipment: None  Intra-op Plan:   Post-operative Plan:   Informed Consent: I have reviewed the patients History and Physical, chart, labs and discussed the procedure including the risks, benefits and alternatives  for the proposed anesthesia with the patient or authorized representative who has indicated his/her understanding and acceptance.       Plan Discussed with: CRNA  Anesthesia Plan Comments:         Anesthesia Quick Evaluation

## 2019-11-21 NOTE — Op Note (Addendum)
[facility] Patient Name: Robin Welch Procedure Date: 11/21/2019 MRN: [id number] Attending MD: Mauri Pole , MD Date of Birth: 11-11-1965 CSN: [id number] Age: 54 Admit Type: Outpatient Procedure:                Upper GI endoscopy Indications:              Dysphagia Providers:                Mauri Pole, MD, Cleda Daub, RN, Theodora Blow, Technician Referring MD:              Medicines:                Monitored Anesthesia Care Complications:            No immediate complications. Estimated Blood Loss:     Estimated blood loss was minimal. Procedure:                Pre-Anesthesia Assessment:                           - Prior to the procedure, a History and Physical                            was performed, and patient medications and                            allergies were reviewed. The patient's tolerance of                            previous anesthesia was also reviewed. The risks                            and benefits of the procedure and the sedation                            options and risks were discussed with the patient.                            All questions were answered, and informed consent                            was obtained. Prior Anticoagulants: The patient has                            taken no previous anticoagulant or antiplatelet                            agents. ASA Grade Assessment: III - A patient with                            severe systemic disease. After reviewing the risks  and benefits, the patient was deemed in                            satisfactory condition to undergo the procedure.                           After obtaining informed consent, the endoscope was                            passed under direct vision. Throughout the                            procedure, the patient's blood pressure, pulse, and                            oxygen saturations were  monitored continuously. The                            [id number]) Olympus ultra slim endoscope                            was introduced through the mouth, and advanced to                            the second part of duodenum. The upper GI endoscopy                            was technically difficult and complex due to                            narrowing. Successful completion of the procedure                            was aided by withdrawing the scope and replacing                            with the pediatric endoscope. The patient tolerated                            the procedure well. Scope In: Scope Out: Findings:      A few benign-appearing, intrinsic severe (stenosis; an endoscope cannot       pass) stenoses were found 30 to 35 cm from the incisors. The narrowest       stenosis measured 5 mm (inner diameter). The stenoses were traversed       after balloon dilation and downsizing scope. A TTS dilator was passed       through the scope. Dilation with a 6-7-8 mm x 5.5 cm CRE balloon dilator       was performed to 8 mm. The dilation site was examined following       endoscope reinsertion and showed mild mucosal disruption.      Many cratered, linear and superficial esophageal ulcers were found 25 to       35 cm from the incisors. The largest lesion was 10 mm in largest  dimension. Mucosa appears slightly improved compared to prior but still       with persistent ulceration.      A medium-sized hiatal hernia was present.      The stomach was normal.      The cardia and gastric fundus were normal on retroflexion.      The examined duodenum was normal. Impression:               - Benign-appearing esophageal stenoses. Dilated.                           - Esophageal ulcers.                           - Medium-sized hiatal hernia.                           - Normal stomach.                           - Normal examined duodenum.                           - No specimens  collected. Moderate Sedation:      Not Applicable - Patient had care per Anesthesia. Recommendation:           - Patient has a contact number available for                            emergencies. The signs and symptoms of potential                            delayed complications were discussed with the                            patient. Return to normal activities tomorrow.                            Written discharge instructions were provided to the                            patient.                           - Mechanical soft diet.                           - Continue present medications.                           - Nexium packet 40mg  BID                           - Sucralfate 1gm before meals and at bedtime                           - Small frequent meals                           -  Prevalite 2gm twice daily with meals, avoid with                            in 4 hours of other medications to avoid drug                            interactions                           - Follow an antireflux regimen indefinitely.                           - Repeat upper endoscopy in 2-4 weeks for                            retreatment to be scheduled at [facility] endoscopy unit. Procedure Code(s):        --- Professional ---                           [phone], Esophagogastroduodenoscopy, flexible,                            transoral; with transendoscopic balloon dilation of                            esophagus (less than 30 mm diameter) Diagnosis Code(s):        --- Professional ---                           K22.2, Esophageal obstruction                           K22.10, Ulcer of esophagus without bleeding                           K44.9, Diaphragmatic hernia without obstruction or                            gangrene                           R13.10, Dysphagia, unspecified CPT copyright 2019 [employer]. All rights reserved. The codes documented in this report are preliminary and upon  coder review may  be revised to meet current compliance requirements. Mauri Pole, MD 11/21/2019 11:08:12 AM This report has been signed electronically. Number of Addenda: 0

## 2019-11-21 NOTE — Anesthesia Postprocedure Evaluation (Signed)
Anesthesia Post Note  Patient: Robin Welch  Procedure(s) Performed: ESOPHAGOGASTRODUODENOSCOPY (EGD) WITH PROPOFOL (N/A ) ESOPHAGEAL DILITATION (N/A )     Patient location during evaluation: PACU Anesthesia Type: MAC Level of consciousness: awake and alert, oriented and patient cooperative Pain management: pain level controlled Vital Signs Assessment: post-procedure vital signs reviewed and stable Respiratory status: spontaneous breathing, nonlabored ventilation and respiratory function stable Cardiovascular status: blood pressure returned to baseline and stable Postop Assessment: no apparent nausea or vomiting Anesthetic complications: no    Last Vitals:  Vitals:   11/21/19 1100 11/21/19 1110  BP: (!) 86/54 94/63  Pulse: 75 79  Resp: 15 13  Temp:    SpO2: 100% 98%    Last Pain:  Vitals:   11/21/19 1100  TempSrc:   PainSc: 0-No pain                 Pervis Hocking

## 2019-11-22 ENCOUNTER — Telehealth: Payer: Self-pay

## 2019-11-22 NOTE — Telephone Encounter (Signed)
-----   Message from Mauri Pole, MD sent at 11/21/2019 11:21 AM EDT ----- Can you please schedule [name] for repeat EGD with esophageal dilation and biopsies in 4 weeks on April 26th, my next procedure block at [facility].  Thank you VN

## 2019-11-22 NOTE — Telephone Encounter (Addendum)
Patient called stating that she was calling someone back about starting Prolia shots. Patient stated that she is not going to have any more dental work any time soon.  Patient stated the dentist told her it should be okay for her to start the shots now.

## 2019-11-22 NOTE — Telephone Encounter (Signed)
Yes please proceed with prolia order

## 2019-11-23 ENCOUNTER — Other Ambulatory Visit: Payer: Self-pay

## 2019-11-23 ENCOUNTER — Encounter: Payer: Self-pay | Admitting: *Deleted

## 2019-11-23 DIAGNOSIS — K222 Esophageal obstruction: Secondary | ICD-10-CM

## 2019-11-23 NOTE — Addendum Note (Signed)
Addended by: Randall An on: 11/23/2019 09:49 AM   Modules accepted: Orders

## 2019-11-23 NOTE — Telephone Encounter (Signed)
Called the patient. No answer. Left a message with the dates of the COVID screening and the procedure. Instructions printed for mailing.  COVID test 12/21/19 at [place] EGD arrive at [address] to [facility] Admissions office.

## 2019-11-23 NOTE — Telephone Encounter (Signed)
Contacted pt and advised she will need labs first. She wants them done at [facility]. She will call them and have labs tomorrow. Advised this nurse will f/u next week to check her labs and get her scheduled for injection if labs are ok. Pt verbalized understanding.

## 2019-11-24 ENCOUNTER — Telehealth: Payer: Self-pay | Admitting: Gastroenterology

## 2019-11-24 NOTE — Telephone Encounter (Signed)
Yes it is cholestyramine, okay to discontinue it if she is not tolerating it.  Please schedule her for office visit with soon for evaluation and management of diarrhea.  She will need repeat EGD with esophageal dilation in 4 weeks at [facility] endoscopy unit.  Thank you

## 2019-11-24 NOTE — Telephone Encounter (Signed)
Patient states, "that stuff I got for diarrhea makes me feel awful!" She c/o feeling tired and sluggish on the medication. She cannot remember the name of the medication, but she stopped it today. She states she feels better now, but a little dehydrated and she is drinking lots of water. I think it may be the cholestyramine. I do not see anything else for diarrhea on her med list. Suggestions?

## 2019-11-24 NOTE — Telephone Encounter (Signed)
Left message with this information

## 2019-11-24 NOTE — Addendum Note (Signed)
Addended by: Ria Bush on: 11/24/2019 07:09 AM   Modules accepted: Orders

## 2019-11-24 NOTE — Telephone Encounter (Signed)
Labs ordered.

## 2019-12-13 ENCOUNTER — Telehealth: Payer: Self-pay | Admitting: Gastroenterology

## 2019-12-13 NOTE — Telephone Encounter (Signed)
Pt returned your call.  She requested a call back after 3:30 pm.

## 2019-12-13 NOTE — Telephone Encounter (Signed)
Called patient back. No answer. Left a message on her voicemail of the returned call.

## 2019-12-13 NOTE — Telephone Encounter (Signed)
Patient would like to go forward with the [facility] referral to    Dr Renford Dills. Records and referral form faxed. [place] [phone] f [phone]

## 2019-12-19 NOTE — Telephone Encounter (Signed)
Confirmed the records were received and are "under review by the doctor."

## 2019-12-21 ENCOUNTER — Other Ambulatory Visit (HOSPITAL_COMMUNITY): Payer: Medicare HMO

## 2019-12-25 ENCOUNTER — Ambulatory Visit (HOSPITAL_COMMUNITY): Admit: 2019-12-25 | Payer: Medicare HMO | Admitting: Gastroenterology

## 2019-12-25 ENCOUNTER — Encounter (HOSPITAL_COMMUNITY): Payer: Self-pay

## 2019-12-25 ENCOUNTER — Telehealth: Payer: Self-pay | Admitting: Family Medicine

## 2019-12-25 SURGERY — ESOPHAGOGASTRODUODENOSCOPY (EGD) WITH PROPOFOL
Anesthesia: Monitor Anesthesia Care

## 2019-12-25 NOTE — Telephone Encounter (Signed)
Appointment for a virtual visit has been scheduled per [facility] notes.

## 2019-12-25 NOTE — Telephone Encounter (Signed)
Called patient to schedule CPE and labs and she stated she will call us back to schedule those appointments.

## 2019-12-28 ENCOUNTER — Telehealth: Payer: Self-pay

## 2019-12-28 ENCOUNTER — Ambulatory Visit: Payer: Medicare HMO

## 2019-12-28 NOTE — Telephone Encounter (Signed)
Called patient to complete his Medicare Wellness Visit and she stated that she does not have to do this right now. Stated she will call back at a later date to reschedule. Appointment was cancelled.

## 2020-01-01 ENCOUNTER — Other Ambulatory Visit: Payer: Self-pay | Admitting: Psychiatry

## 2020-01-01 DIAGNOSIS — R1310 Dysphagia, unspecified: Secondary | ICD-10-CM | POA: Diagnosis not present

## 2020-01-01 DIAGNOSIS — K224 Dyskinesia of esophagus: Secondary | ICD-10-CM | POA: Diagnosis not present

## 2020-01-01 DIAGNOSIS — F5105 Insomnia due to other mental disorder: Secondary | ICD-10-CM

## 2020-01-01 DIAGNOSIS — K219 Gastro-esophageal reflux disease without esophagitis: Secondary | ICD-10-CM | POA: Diagnosis not present

## 2020-01-01 DIAGNOSIS — K222 Esophageal obstruction: Secondary | ICD-10-CM | POA: Diagnosis not present

## 2020-01-01 DIAGNOSIS — F319 Bipolar disorder, unspecified: Secondary | ICD-10-CM

## 2020-01-05 DIAGNOSIS — Z01812 Encounter for preprocedural laboratory examination: Secondary | ICD-10-CM | POA: Diagnosis not present

## 2020-01-05 DIAGNOSIS — Z20822 Contact with and (suspected) exposure to covid-19: Secondary | ICD-10-CM | POA: Diagnosis not present

## 2020-01-07 DIAGNOSIS — R350 Frequency of micturition: Secondary | ICD-10-CM | POA: Diagnosis not present

## 2020-01-07 DIAGNOSIS — R1084 Generalized abdominal pain: Secondary | ICD-10-CM | POA: Diagnosis not present

## 2020-01-07 DIAGNOSIS — R300 Dysuria: Secondary | ICD-10-CM | POA: Diagnosis not present

## 2020-01-08 DIAGNOSIS — J449 Chronic obstructive pulmonary disease, unspecified: Secondary | ICD-10-CM | POA: Diagnosis not present

## 2020-01-08 DIAGNOSIS — K2270 Barrett's esophagus without dysplasia: Secondary | ICD-10-CM | POA: Diagnosis not present

## 2020-01-08 DIAGNOSIS — K222 Esophageal obstruction: Secondary | ICD-10-CM | POA: Diagnosis not present

## 2020-01-08 DIAGNOSIS — F1421 Cocaine dependence, in remission: Secondary | ICD-10-CM | POA: Diagnosis not present

## 2020-01-08 DIAGNOSIS — R569 Unspecified convulsions: Secondary | ICD-10-CM | POA: Diagnosis not present

## 2020-01-08 DIAGNOSIS — K449 Diaphragmatic hernia without obstruction or gangrene: Secondary | ICD-10-CM | POA: Diagnosis not present

## 2020-01-08 DIAGNOSIS — K2100 Gastro-esophageal reflux disease with esophagitis, without bleeding: Secondary | ICD-10-CM | POA: Diagnosis not present

## 2020-01-08 DIAGNOSIS — R519 Headache, unspecified: Secondary | ICD-10-CM | POA: Diagnosis not present

## 2020-01-08 DIAGNOSIS — F319 Bipolar disorder, unspecified: Secondary | ICD-10-CM | POA: Diagnosis not present

## 2020-01-22 DIAGNOSIS — R1310 Dysphagia, unspecified: Secondary | ICD-10-CM | POA: Diagnosis not present

## 2020-01-22 DIAGNOSIS — K2100 Gastro-esophageal reflux disease with esophagitis, without bleeding: Secondary | ICD-10-CM | POA: Diagnosis not present

## 2020-01-22 DIAGNOSIS — K449 Diaphragmatic hernia without obstruction or gangrene: Secondary | ICD-10-CM | POA: Diagnosis not present

## 2020-01-22 DIAGNOSIS — K222 Esophageal obstruction: Secondary | ICD-10-CM | POA: Diagnosis not present

## 2020-01-22 DIAGNOSIS — Z9049 Acquired absence of other specified parts of digestive tract: Secondary | ICD-10-CM | POA: Diagnosis not present

## 2020-01-22 DIAGNOSIS — R569 Unspecified convulsions: Secondary | ICD-10-CM | POA: Diagnosis not present

## 2020-01-22 DIAGNOSIS — J449 Chronic obstructive pulmonary disease, unspecified: Secondary | ICD-10-CM | POA: Diagnosis not present

## 2020-01-22 DIAGNOSIS — K2270 Barrett's esophagus without dysplasia: Secondary | ICD-10-CM | POA: Diagnosis not present

## 2020-01-22 DIAGNOSIS — F17210 Nicotine dependence, cigarettes, uncomplicated: Secondary | ICD-10-CM | POA: Diagnosis not present

## 2020-02-02 ENCOUNTER — Telehealth: Payer: Self-pay | Admitting: Psychiatry

## 2020-02-02 NOTE — Telephone Encounter (Signed)
Called Pt. Assistance and reorder 3mg  Vraylar.  Will be delivered to the office in 5-10 business days.  They indicated this was the 3rd refill because we initially ordered 1.5mg  in Feb. Then changed to 3mg  in March and now ordered this refill so will probably need to send new prescription with refills to get her through the year.

## 2020-02-02 NOTE — Telephone Encounter (Signed)
Noted thank you

## 2020-02-02 NOTE — Telephone Encounter (Signed)
Pt needs refill on VRAYLAR. Please send through patient assistance.

## 2020-02-05 NOTE — Telephone Encounter (Signed)
Contacted pt who reports she has been working two jobs and has not had time to do the labs yet. She reports she knows she still needs to do them and she will do them as soon as possible.  Advised if anything is needed to contact the office. Pt verbalized understanding.

## 2020-02-08 ENCOUNTER — Telehealth: Payer: Self-pay | Admitting: Psychiatry

## 2020-02-08 NOTE — Telephone Encounter (Signed)
Needs a letter to her attorney- Margarette Canada Fax: [phone] In the letter, it needs to state that she was on Seroquel, Zyprexa and Geodon and the dates she was on it. It is for a DWI charge- attorney asked her to get letter stating those were the meds she was taking. Needs before court Tuesday. Just found out.

## 2020-02-12 ENCOUNTER — Other Ambulatory Visit: Payer: Self-pay

## 2020-02-12 DIAGNOSIS — F319 Bipolar disorder, unspecified: Secondary | ICD-10-CM

## 2020-02-21 DIAGNOSIS — K2270 Barrett's esophagus without dysplasia: Secondary | ICD-10-CM | POA: Diagnosis not present

## 2020-02-21 DIAGNOSIS — Z9049 Acquired absence of other specified parts of digestive tract: Secondary | ICD-10-CM | POA: Diagnosis not present

## 2020-02-21 DIAGNOSIS — K2100 Gastro-esophageal reflux disease with esophagitis, without bleeding: Secondary | ICD-10-CM | POA: Diagnosis not present

## 2020-02-21 DIAGNOSIS — K449 Diaphragmatic hernia without obstruction or gangrene: Secondary | ICD-10-CM | POA: Diagnosis not present

## 2020-02-21 DIAGNOSIS — K222 Esophageal obstruction: Secondary | ICD-10-CM | POA: Diagnosis not present

## 2020-02-21 DIAGNOSIS — J449 Chronic obstructive pulmonary disease, unspecified: Secondary | ICD-10-CM | POA: Diagnosis not present

## 2020-02-27 ENCOUNTER — Other Ambulatory Visit: Payer: Self-pay | Admitting: Psychiatry

## 2020-02-27 DIAGNOSIS — F5105 Insomnia due to other mental disorder: Secondary | ICD-10-CM

## 2020-02-27 DIAGNOSIS — F319 Bipolar disorder, unspecified: Secondary | ICD-10-CM

## 2020-02-28 DIAGNOSIS — K219 Gastro-esophageal reflux disease without esophagitis: Secondary | ICD-10-CM | POA: Diagnosis not present

## 2020-03-01 NOTE — Telephone Encounter (Signed)
Called patient and left voicemail for patient needs to schedule cpe and labs

## 2020-03-01 NOTE — Telephone Encounter (Signed)
Noted  

## 2020-03-15 DIAGNOSIS — M546 Pain in thoracic spine: Secondary | ICD-10-CM | POA: Diagnosis not present

## 2020-03-16 ENCOUNTER — Encounter (HOSPITAL_COMMUNITY): Payer: Self-pay

## 2020-03-16 ENCOUNTER — Inpatient Hospital Stay (HOSPITAL_COMMUNITY): Payer: Medicare HMO

## 2020-03-16 ENCOUNTER — Other Ambulatory Visit: Payer: Self-pay

## 2020-03-16 ENCOUNTER — Inpatient Hospital Stay (HOSPITAL_COMMUNITY)
Admission: EM | Admit: 2020-03-16 | Discharge: 2020-03-18 | DRG: 871 | Disposition: A | Discharge status: Home / Self Care | Payer: Medicare HMO | Attending: Family Medicine | Admitting: Family Medicine

## 2020-03-16 DIAGNOSIS — Z9049 Acquired absence of other specified parts of digestive tract: Secondary | ICD-10-CM

## 2020-03-16 DIAGNOSIS — Z8719 Personal history of other diseases of the digestive system: Secondary | ICD-10-CM | POA: Diagnosis not present

## 2020-03-16 DIAGNOSIS — F411 Generalized anxiety disorder: Secondary | ICD-10-CM | POA: Diagnosis present

## 2020-03-16 DIAGNOSIS — K219 Gastro-esophageal reflux disease without esophagitis: Secondary | ICD-10-CM | POA: Diagnosis present

## 2020-03-16 DIAGNOSIS — J449 Chronic obstructive pulmonary disease, unspecified: Secondary | ICD-10-CM

## 2020-03-16 DIAGNOSIS — F319 Bipolar disorder, unspecified: Secondary | ICD-10-CM | POA: Diagnosis not present

## 2020-03-16 DIAGNOSIS — M810 Age-related osteoporosis without current pathological fracture: Secondary | ICD-10-CM | POA: Diagnosis present

## 2020-03-16 DIAGNOSIS — Z888 Allergy status to other drugs, medicaments and biological substances status: Secondary | ICD-10-CM

## 2020-03-16 DIAGNOSIS — J440 Chronic obstructive pulmonary disease with acute lower respiratory infection: Secondary | ICD-10-CM | POA: Diagnosis present

## 2020-03-16 DIAGNOSIS — J439 Emphysema, unspecified: Secondary | ICD-10-CM | POA: Diagnosis not present

## 2020-03-16 DIAGNOSIS — K2101 Gastro-esophageal reflux disease with esophagitis, with bleeding: Secondary | ICD-10-CM | POA: Diagnosis not present

## 2020-03-16 DIAGNOSIS — R112 Nausea with vomiting, unspecified: Secondary | ICD-10-CM | POA: Diagnosis present

## 2020-03-16 DIAGNOSIS — E876 Hypokalemia: Secondary | ICD-10-CM | POA: Diagnosis not present

## 2020-03-16 DIAGNOSIS — Z818 Family history of other mental and behavioral disorders: Secondary | ICD-10-CM

## 2020-03-16 DIAGNOSIS — R451 Restlessness and agitation: Secondary | ICD-10-CM | POA: Diagnosis not present

## 2020-03-16 DIAGNOSIS — A419 Sepsis, unspecified organism: Secondary | ICD-10-CM

## 2020-03-16 DIAGNOSIS — K279 Peptic ulcer, site unspecified, unspecified as acute or chronic, without hemorrhage or perforation: Secondary | ICD-10-CM

## 2020-03-16 DIAGNOSIS — K920 Hematemesis: Secondary | ICD-10-CM | POA: Diagnosis not present

## 2020-03-16 DIAGNOSIS — R296 Repeated falls: Secondary | ICD-10-CM | POA: Diagnosis present

## 2020-03-16 DIAGNOSIS — D509 Iron deficiency anemia, unspecified: Secondary | ICD-10-CM | POA: Diagnosis present

## 2020-03-16 DIAGNOSIS — J4489 Other specified chronic obstructive pulmonary disease: Secondary | ICD-10-CM | POA: Diagnosis present

## 2020-03-16 DIAGNOSIS — R911 Solitary pulmonary nodule: Secondary | ICD-10-CM | POA: Diagnosis present

## 2020-03-16 DIAGNOSIS — F17210 Nicotine dependence, cigarettes, uncomplicated: Secondary | ICD-10-CM | POA: Diagnosis present

## 2020-03-16 DIAGNOSIS — E871 Hypo-osmolality and hyponatremia: Secondary | ICD-10-CM | POA: Diagnosis not present

## 2020-03-16 DIAGNOSIS — D72829 Elevated white blood cell count, unspecified: Secondary | ICD-10-CM

## 2020-03-16 DIAGNOSIS — K222 Esophageal obstruction: Secondary | ICD-10-CM

## 2020-03-16 DIAGNOSIS — Z20822 Contact with and (suspected) exposure to covid-19: Secondary | ICD-10-CM | POA: Diagnosis not present

## 2020-03-16 DIAGNOSIS — K449 Diaphragmatic hernia without obstruction or gangrene: Secondary | ICD-10-CM | POA: Diagnosis present

## 2020-03-16 DIAGNOSIS — Z8711 Personal history of peptic ulcer disease: Secondary | ICD-10-CM | POA: Diagnosis not present

## 2020-03-16 DIAGNOSIS — Z885 Allergy status to narcotic agent status: Secondary | ICD-10-CM

## 2020-03-16 DIAGNOSIS — E860 Dehydration: Secondary | ICD-10-CM | POA: Diagnosis present

## 2020-03-16 DIAGNOSIS — M4804 Spinal stenosis, thoracic region: Secondary | ICD-10-CM | POA: Diagnosis not present

## 2020-03-16 DIAGNOSIS — R05 Cough: Secondary | ICD-10-CM | POA: Diagnosis not present

## 2020-03-16 DIAGNOSIS — F1490 Cocaine use, unspecified, uncomplicated: Secondary | ICD-10-CM | POA: Diagnosis present

## 2020-03-16 DIAGNOSIS — R710 Precipitous drop in hematocrit: Secondary | ICD-10-CM | POA: Diagnosis not present

## 2020-03-16 DIAGNOSIS — R059 Cough, unspecified: Secondary | ICD-10-CM

## 2020-03-16 DIAGNOSIS — G4700 Insomnia, unspecified: Secondary | ICD-10-CM | POA: Diagnosis present

## 2020-03-16 DIAGNOSIS — E878 Other disorders of electrolyte and fluid balance, not elsewhere classified: Secondary | ICD-10-CM | POA: Diagnosis present

## 2020-03-16 DIAGNOSIS — J9690 Respiratory failure, unspecified, unspecified whether with hypoxia or hypercapnia: Secondary | ICD-10-CM | POA: Diagnosis not present

## 2020-03-16 DIAGNOSIS — Z79899 Other long term (current) drug therapy: Secondary | ICD-10-CM

## 2020-03-16 DIAGNOSIS — Z881 Allergy status to other antibiotic agents status: Secondary | ICD-10-CM

## 2020-03-16 DIAGNOSIS — J189 Pneumonia, unspecified organism: Secondary | ICD-10-CM

## 2020-03-16 DIAGNOSIS — Z560 Unemployment, unspecified: Secondary | ICD-10-CM

## 2020-03-16 HISTORY — DX: Sepsis, unspecified organism: A41.9

## 2020-03-16 LAB — COMPREHENSIVE METABOLIC PANEL
ALT: 17 U/L (ref 0–44)
AST: 26 U/L (ref 15–41)
Albumin: 3.7 g/dL (ref 3.5–5.0)
Alkaline Phosphatase: 125 U/L (ref 38–126)
Anion gap: 15 (ref 5–15)
BUN: 12 mg/dL (ref 6–20)
CO2: 33 mmol/L — ABNORMAL HIGH (ref 22–32)
Calcium: 9 mg/dL (ref 8.9–10.3)
Chloride: 73 mmol/L — ABNORMAL LOW (ref 98–111)
Creatinine, Ser: 0.53 mg/dL (ref 0.44–1.00)
GFR calc Af Amer: >60 mL/min (ref >60)
GFR calc non Af Amer: >60 mL/min (ref >60)
Glucose, Bld: 110 mg/dL — ABNORMAL HIGH (ref 70–99)
Potassium: 3 mmol/L — ABNORMAL LOW (ref 3.5–5.1)
Sodium: 121 mmol/L — ABNORMAL LOW (ref 135–145)
Total Bilirubin: 0.9 mg/dL (ref 0.3–1.2)
Total Protein: 7.3 g/dL (ref 6.5–8.1)

## 2020-03-16 LAB — PREGNANCY, URINE: Preg Test, Ur: NEGATIVE

## 2020-03-16 LAB — CBC
HCT: 31.6 % — ABNORMAL LOW (ref 36.0–46.0)
Hemoglobin: 11.2 g/dL — ABNORMAL LOW (ref 12.0–15.0)
MCH: 31.9 pg (ref 26.0–34.0)
MCHC: 35.4 g/dL (ref 30.0–36.0)
MCV: 90 fL (ref 80.0–100.0)
Platelets: 431 10*3/uL — ABNORMAL HIGH (ref 150–400)
RBC: 3.51 MIL/uL — ABNORMAL LOW (ref 3.87–5.11)
RDW: 12.5 % (ref 11.5–15.5)
WBC: 17.1 10*3/uL — ABNORMAL HIGH (ref 4.0–10.5)
nRBC: 0 % (ref 0.0–0.2)

## 2020-03-16 LAB — URINALYSIS, ROUTINE W REFLEX MICROSCOPIC
Bilirubin Urine: NEGATIVE
Glucose, UA: NEGATIVE mg/dL
Ketones, ur: 20 mg/dL — AB
Leukocytes,Ua: NEGATIVE
Nitrite: NEGATIVE
Protein, ur: NEGATIVE mg/dL
Specific Gravity, Urine: 1.002 — ABNORMAL LOW (ref 1.005–1.030)
pH: 7 (ref 5.0–8.0)

## 2020-03-16 LAB — I-STAT BETA HCG BLOOD, ED (MC, WL, AP ONLY): I-stat hCG, quantitative: 22.8 m[IU]/mL — ABNORMAL HIGH (ref <5)

## 2020-03-16 LAB — OSMOLALITY: Osmolality: 264 mOsm/kg — ABNORMAL LOW (ref 275–295)

## 2020-03-16 LAB — SODIUM, URINE, RANDOM: Sodium, Ur: 26 mmol/L

## 2020-03-16 LAB — SARS CORONAVIRUS 2 BY RT PCR (HOSPITAL ORDER, PERFORMED IN ~~LOC~~ HOSPITAL LAB): SARS Coronavirus 2: NEGATIVE

## 2020-03-16 LAB — LIPASE, BLOOD: Lipase: 18 U/L (ref 11–51)

## 2020-03-16 LAB — ETHANOL: Alcohol, Ethyl (B): <10 mg/dL (ref <10)

## 2020-03-16 LAB — HIV ANTIBODY (ROUTINE TESTING W REFLEX): HIV Screen 4th Generation wRfx: NONREACTIVE

## 2020-03-16 LAB — OSMOLALITY, URINE: Osmolality, Ur: 141 mOsm/kg — ABNORMAL LOW (ref 300–900)

## 2020-03-16 MED ORDER — ACETAMINOPHEN 325 MG PO TABS
650.0000 mg | ORAL_TABLET | Freq: Four times a day (QID) | ORAL | Status: DC | PRN
  Administered 2020-03-17 – 2020-03-18 (×5): 650 mg via ORAL
  Filled 2020-03-16 (×5): qty 2

## 2020-03-16 MED ORDER — POTASSIUM CHLORIDE 10 MEQ/100ML IV SOLN: 10.0000 meq | Freq: Once | INTRAVENOUS | Status: DC

## 2020-03-16 MED ORDER — LORAZEPAM 2 MG/ML IJ SOLN
1.0000 mg | Freq: Once | INTRAMUSCULAR | Status: AC
  Administered 2020-03-16: 1 mg via INTRAVENOUS
  Filled 2020-03-16: qty 1

## 2020-03-16 MED ORDER — MORPHINE SULFATE (PF) 2 MG/ML IV SOLN
2.0000 mg | Freq: Once | INTRAVENOUS | Status: AC
  Administered 2020-03-16: 2 mg via INTRAVENOUS
  Filled 2020-03-16: qty 1

## 2020-03-16 MED ORDER — SODIUM CHLORIDE 0.9 % IV SOLN
80.0000 mg | Freq: Once | INTRAVENOUS | Status: AC
  Administered 2020-03-16: 80 mg via INTRAVENOUS
  Filled 2020-03-16: qty 80

## 2020-03-16 MED ORDER — SUCRALFATE 1 G PO TABS
1.0000 g | ORAL_TABLET | Freq: Once | ORAL | Status: DC
  Filled 2020-03-16: qty 1

## 2020-03-16 MED ORDER — LEVOFLOXACIN IN D5W 750 MG/150ML IV SOLN: 750.0000 mg | INTRAVENOUS | Status: DC

## 2020-03-16 MED ORDER — SODIUM CHLORIDE 0.9 % IV BOLUS
500.0000 mL | Freq: Once | INTRAVENOUS | Status: AC
  Administered 2020-03-16: 500 mL via INTRAVENOUS

## 2020-03-16 MED ORDER — SODIUM CHLORIDE 0.9 % IV SOLN: INTRAVENOUS | Status: DC

## 2020-03-16 MED ORDER — SUCRALFATE 1 G PO TABS
1.0000 g | ORAL_TABLET | Freq: Three times a day (TID) | ORAL | Status: DC
  Administered 2020-03-16 – 2020-03-18 (×7): 1 g via ORAL
  Filled 2020-03-16 (×7): qty 1

## 2020-03-16 MED ORDER — SODIUM CHLORIDE 0.9 % IV SOLN
8.0000 mg/h | INTRAVENOUS | Status: DC
  Administered 2020-03-16: 8 mg/h via INTRAVENOUS
  Filled 2020-03-16: qty 80

## 2020-03-16 MED ORDER — SODIUM CHLORIDE 0.9 % IV SOLN
3.0000 g | Freq: Four times a day (QID) | INTRAVENOUS | Status: DC
  Administered 2020-03-16 – 2020-03-18 (×8): 3 g via INTRAVENOUS
  Filled 2020-03-16: qty 3
  Filled 2020-03-16: qty 8
  Filled 2020-03-16 (×3): qty 3
  Filled 2020-03-16: qty 8
  Filled 2020-03-16 (×4): qty 3

## 2020-03-16 MED ORDER — POTASSIUM CHLORIDE 10 MEQ/100ML IV SOLN
10.0000 meq | INTRAVENOUS | Status: AC
  Administered 2020-03-16 (×5): 10 meq via INTRAVENOUS
  Filled 2020-03-16 (×5): qty 100

## 2020-03-16 MED ORDER — PANTOPRAZOLE SODIUM 40 MG IV SOLR: 40.0000 mg | Freq: Two times a day (BID) | INTRAVENOUS | Status: DC

## 2020-03-16 MED ORDER — QUETIAPINE FUMARATE 300 MG PO TABS
800.0000 mg | ORAL_TABLET | Freq: Every day | ORAL | Status: DC
  Administered 2020-03-16 – 2020-03-17 (×2): 800 mg via ORAL
  Filled 2020-03-16 (×2): qty 1

## 2020-03-16 MED ORDER — PANTOPRAZOLE SODIUM 40 MG IV SOLR
40.0000 mg | Freq: Two times a day (BID) | INTRAVENOUS | Status: DC
  Administered 2020-03-17 – 2020-03-18 (×3): 40 mg via INTRAVENOUS
  Filled 2020-03-16 (×3): qty 40

## 2020-03-16 MED ORDER — ALBUTEROL SULFATE (2.5 MG/3ML) 0.083% IN NEBU: 2.5000 mg | INHALATION_SOLUTION | RESPIRATORY_TRACT | Status: DC | PRN

## 2020-03-16 MED ORDER — ALBUTEROL SULFATE HFA 108 (90 BASE) MCG/ACT IN AERS: 2.0000 | INHALATION_SPRAY | RESPIRATORY_TRACT | Status: DC | PRN

## 2020-03-16 MED ORDER — ACETAMINOPHEN 650 MG RE SUPP: 650.0000 mg | Freq: Four times a day (QID) | RECTAL | Status: DC | PRN

## 2020-03-16 MED ORDER — SODIUM CHLORIDE 0.9 % IV BOLUS
1000.0000 mL | Freq: Once | INTRAVENOUS | Status: AC
  Administered 2020-03-16: 1000 mL via INTRAVENOUS

## 2020-03-16 MED ORDER — POTASSIUM CHLORIDE CRYS ER 20 MEQ PO TBCR: 40.0000 meq | EXTENDED_RELEASE_TABLET | Freq: Once | ORAL | Status: DC

## 2020-03-16 MED ORDER — CARIPRAZINE HCL 1.5 MG PO CAPS
3.0000 mg | ORAL_CAPSULE | Freq: Every day | ORAL | Status: DC
  Administered 2020-03-17 – 2020-03-18 (×2): 3 mg via ORAL
  Filled 2020-03-16 (×3): qty 2

## 2020-03-16 NOTE — ED Notes (Signed)
Originally x2 attempts to get IV and then pt removed IV.

## 2020-03-16 NOTE — ED Provider Notes (Signed)
Medical screening examination/treatment/procedure(s) were conducted as a shared visit with non-physician practitioner(s) and myself.  I personally evaluated the patient during the encounter. Briefly, the patient is a 54 y.o. female with history of bipolar, esophageal strictures, esophagitis who presents to the ED with ongoing difficulty with eating and drinking.  Some epigastric abdominal pain.  Has had some blood in her vomit.  Unable to tolerate p.o.  Follows closely with GI.  Recently saw GI specialist for other types of treatment options as she has had multiple strictures and dilations in the past.  Patient given IV Protonix, Carafate, potassium.  Patient with hyponatremia of 121 likely in the setting of dehydration.  Will give fluid hydration therapy.  GI has been consulted and will evaluate the patient.  Otherwise lab work is reassuring.  No major anemia.  Vital signs are stable.  Patient to hospitalist for further care.  Abdomen is overall benign.  This chart was dictated using voice recognition software.  Despite best efforts to proofread,  errors can occur which can change the documentation meaning.     EKG Interpretation None           Lennice Sites, DO 03/16/20 1416

## 2020-03-16 NOTE — Progress Notes (Signed)
Patient triggered a yellow mews score for increased heart rate, HR has been between 110-124 this evening, pt asymptomatic, currently sleeping, awakens to voice, still with confusion and inappropriate behavior and responses, ordered received for 500cc nss bolus- infusing now, next vitals to be taken at 0100, will continue to monitor.

## 2020-03-16 NOTE — Progress Notes (Signed)
Pharmacy Antibiotic Note  Robin Welch is a 54 y.o. female admitted on 03/16/2020 with aspiration PNA.  Pharmacy has been consulted for Unasyn dosing.  Plan: Unasyn 3g IV q6 Will sign off     Temp (24hrs), Avg:98 F (36.7 C), Min:98 F (36.7 C), Max:98 F (36.7 C)  Recent Labs  Lab 03/16/20 0915  WBC 17.1*  CREATININE 0.53    CrCl cannot be calculated (Unknown ideal weight.).    Allergies  Allergen Reactions  . Prozac [Fluoxetine Hcl] Swelling and Rash  . Doxycycline Nausea And Vomiting  . Celexa [Citalopram Hydrobromide] Other (See Comments)    Possible allergic reaction  . Chantix [Varenicline Tartrate] Swelling    Mouth swelling  . Hydroxyzine Other (See Comments)    Not effective  . Klonopin [Clonazepam] Other (See Comments)    Caused instablility. Not able to drive due to over medicated  . Lunesta [Eszopiclone] Other (See Comments)    Not effective  . Sonata [Zaleplon] Other (See Comments)    Not effective  . Trazodone And Nefazodone Other (See Comments)    ineffective  . Zolpimist [Zolpidem Tartrate] Other (See Comments)    Not effective  . Azithromycin Other (See Comments)    Reaction not recalled by the patient      Thank you for allowing pharmacy to be a part of this patient's care.  Kara Mead 03/16/2020 4:14 PM

## 2020-03-16 NOTE — ED Provider Notes (Addendum)
[facility] DEPT Provider Note   CSN: [id number] Arrival date & time: 03/16/20  0841     History Chief Complaint  Patient presents with  . Abdominal Pain    Robin Welch is a 54 y.o. female with past medical history significant for bipolar 1 disorder, severe erosive esophagitis and Barrett's esophagus, polysubstance abuse, and gastric ulcers who presents to the ED with complaints of abdominal pain.  Patient reports that she was at her orthopedic office yesterday given her concern for spinal fracture in the context of abdominal and mid back pain.  She notes that she has a significant history of osteoporosis and is also being seen there for a right shoulder injury.  She reports that her abdominal/back pain began approximately 4 days ago and is continuing to cause her significant discomfort.  She has been taking Advil for her pain symptoms and also endorses taking a couple old Vicodin medications for her pain.  Yesterday she endorses having multiple episodes of large coffee-ground emesis.  Patient reports that she "tastes blood".  She denies any nausea or emesis here today.  She states that she takes her antacids, as directed.  She also endorses 1 pack/day tobacco use.  She denies any history of IVDA, fevers or chills, recent illness or infection, chest pain or difficulty breathing, recent trauma, urinary symptoms, obvious melena or hematochezia, or other symptoms.  HPI     Past Medical History:  Diagnosis Date  . Anemia   . Anxiety   . Barrett esophagus   . Bipolar 1 disorder [facility])    sees Dr. Candis Schatz psych [phone])  . Closed fracture distal radius and ulna, left, initial encounter 04/20/2019   S/p ORIF 04/2019 [facility])   . COPD (chronic obstructive pulmonary disease) ([place]) 03/2013   hyperinflation by CXR  . Elevated alkaline phosphatase level   . Esophagitis   . Hiatal hernia   . History of cocaine dependence ([place]) latest 2020   undergoing detox  .  History of stomach ulcers   . Hx: UTI (urinary tract infection)   . Migraine    "stopped in ~ 2008" (03/11/2017)  . Muscle strain of gluteal region, right, subsequent encounter 10/05/2018   By MRI 2020  . Osteoporosis 06/09/2018   DEXA 05/2017 - T score -2.5 L hip, -1.3 spine  . Positive PPD 1989   s/p CXR and INH 6 mo  . Seizure ([place]) X 1   "from takiing too many headache medicine?" (03/11/2017)  . Smoker     Patient Active Problem List   Diagnosis Date Noted  . Hyponatremia 03/16/2020  . Ulcer of esophagus with bleeding   . Esophageal dysphagia 09/14/2019  . Gastroesophageal reflux disease with esophagitis and hemorrhage 09/14/2019  . Erosive esophagitis   . Esophageal stricture   . Anemia   . Upper GI bleed 07/18/2019  . Frequent falls 07/15/2019  . Abnormal urinalysis 07/15/2019  . Closed fracture of right proximal humerus 07/09/2019  . Protein-calorie malnutrition ([place]) 07/08/2019  . Ataxia 07/08/2019  . Hypocalcemia 07/06/2019  . Pulmonary nodule less than 1 cm in diameter with moderate to high risk for malignant neoplasm 06/01/2019  . Closed fracture distal radius and ulna, right, initial encounter 04/20/2019  . Diarrhea of presumed infectious origin 01/31/2019  . GAD (generalized anxiety disorder) 09/02/2018  . Insomnia 09/02/2018  . Low serum vitamin B12 07/19/2018  . Memory loss 06/20/2018  . Osteoporosis 06/09/2018  . Chronic constipation 04/19/2018  . Gastroesophageal reflux disease 04/19/2018  .  Hematemesis 04/19/2018  . Chronic hyponatremia 03/11/2017  . Iron deficiency anemia 03/11/2017  . Thrombocytosis ([place]) 03/02/2017  . Non-intractable vomiting with nausea 03/01/2017  . History of cocaine dependence ([place])   . Migraine 05/08/2015  . Hoarseness of voice 04/04/2015  . Weight loss, unintentional 03/08/2015  . COPD (chronic obstructive pulmonary disease) with chronic bronchitis ([place]) 09/22/2013  . Tobacco abuse 12/18/2010  . Bipolar 1 disorder ([place])  12/18/2010    Past Surgical History:  Procedure Laterality Date  . BIOPSY  07/19/2019   Procedure: BIOPSY;  Surgeon: Gatha Mayer, MD;  Location: [facility];  Service: Endoscopy;;  . COLONOSCOPY  07/2018   poor colon prep - rec repeat ([place])  . DILATION AND CURETTAGE OF UTERUS  X 1   S/P miscarriage  . ESOPHAGOGASTRODUODENOSCOPY  03/2017   candidal esophagitis, treated, LA grade D esophagitis, 4cm HH ([place])  . ESOPHAGOGASTRODUODENOSCOPY  07/2018   severe reflux esophagitis, neg candida ([place])  . ESOPHAGOGASTRODUODENOSCOPY (EGD) WITH PROPOFOL N/A 07/19/2019   Procedure: ESOPHAGOGASTRODUODENOSCOPY (EGD) WITH PROPOFOL;  Surgeon: Gatha Mayer, MD;  Location: [facility];  Service: Endoscopy;  Laterality: N/A;  . ESOPHAGOGASTRODUODENOSCOPY (EGD) WITH PROPOFOL N/A 10/24/2019   Procedure: ESOPHAGOGASTRODUODENOSCOPY (EGD) WITH PROPOFOL;  Surgeon: Mauri Pole, MD;  Location: [facility];  Service: Endoscopy;  Laterality: N/A;  . ESOPHAGOGASTRODUODENOSCOPY (EGD) WITH PROPOFOL N/A 11/21/2019   Procedure: ESOPHAGOGASTRODUODENOSCOPY (EGD) WITH PROPOFOL;  Surgeon: Mauri Pole, MD;  Location: [facility];  Service: Endoscopy;  Laterality: N/A;  . LAPAROSCOPIC CHOLECYSTECTOMY  2006  . ORIF WRIST FRACTURE Right 04/08/2019   Procedure: OPEN REDUCTION INTERNAL FIXATION (ORIF) DISTAL RADIUS/ULNA FRACTURE;  Surgeon: Verner Mould, MD;  Location: [facility];  Service: Orthopedics;  Laterality: Right;  . TUBAL LIGATION  2003     OB History   No obstetric history on file.     Family History  Problem Relation Age of Onset  . Diabetes Mother        T2DM  . Breast cancer Mother 45  . Dementia Mother   . Lymphoma Mother   . Coronary artery disease Father 33       MI  . Anxiety disorder Paternal Aunt   . Anxiety disorder Cousin   . Depression Cousin   . ADD / ADHD Child   . Anxiety disorder Child   . Depression Child   . Stroke Neg Hx   . Colon cancer Neg Hx    . Cancer - Colon Neg Hx   . Esophageal cancer Neg Hx   . Stomach cancer Neg Hx   . Rectal cancer Neg Hx     Social History   Tobacco Use  . Smoking status: Current Every Day Smoker    Packs/day: 1.00    Years: 36.00    Pack years: 36.00    Types: Cigarettes  . Smokeless tobacco: Never Used  Vaping Use  . Vaping Use: Never used  Substance Use Topics  . Alcohol use: Not Currently    Alcohol/week: 0.0 standard drinks    Comment: 03/11/2017 "stopped 09/18/2015; never an alcoholic"  . Drug use: Yes    Types: Cocaine    Comment: not currently    Home Medications Prior to Admission medications   Medication Sig Start Date End Date Taking? Authorizing Provider  albuterol (VENTOLIN HFA) 108 (90 Base) MCG/ACT inhaler INHALE 2 PUFFS BY MOUTH EVERY 6 HOURS AS NEEDED FOR WHEEZING OR SHORTNESS OF BREATH Patient taking differently: Inhale 2 puffs into the lungs  every 4 (four) hours as needed for wheezing.  08/15/19   Ria Bush, MD  calcium carbonate (CALCIUM 600) 600 MG TABS tablet Take 1 tablet (600 mg total) by mouth daily with breakfast. 07/08/19   Ria Bush, MD  cariprazine [facility]) capsule Take 1 capsule (3 mg total) by mouth daily. 11/06/19   Thayer Headings, PMHNP  Cholecalciferol (VITAMIN D) 50 MCG (2000 UT) CAPS Take 1 capsule (2,000 Units total) by mouth daily. Patient taking differently: Take 2,000 Units by mouth daily.  07/06/19   Ria Bush, MD  cholestyramine light (PREVALITE) 4 g packet Take 0.5 packets (2 g total) by mouth 2 (two) times daily. 11/21/19   Mauri Pole, MD  esomeprazole (NEXIUM) 40 MG packet Take 40 mg by mouth 2 (two) times daily at 8 am and 10 pm. 11/21/19   Mauri Pole, MD  Multiple Vitamin (MULTIVITAMIN WITH MINERALS) TABS tablet Take 1 tablet by mouth daily.    [provider]  QUEtiapine (SEROQUEL) 200 MG tablet TAKE 4 TABLETS AT BEDTIME 02/28/20   Thayer Headings, PMHNP  sucralfate (CARAFATE) 1 g tablet Take 1  tablet (1 g total) by mouth 4 (four) times daily. 11/21/19 11/20/20  Mauri Pole, MD  Suvorexant (BELSOMRA) 15 MG TABS Take 15 mg by mouth at bedtime. 12/05/19   Thayer Headings, PMHNP  Tiotropium Bromide Monohydrate (SPIRIVA RESPIMAT) 2.5 MCG/ACT AERS Inhale 1 puff into the lungs daily. Patient not taking: Reported on 10/17/2019 07/06/19   Ria Bush, MD    Allergies    Prozac [fluoxetine hcl], Doxycycline, Celexa [citalopram hydrobromide], Chantix [varenicline tartrate], Hydroxyzine, Klonopin [clonazepam], Lunesta [eszopiclone], Sonata [zaleplon], Trazodone and nefazodone, Zolpimist [zolpidem tartrate], and Azithromycin  Review of Systems   Review of Systems  All other systems reviewed and are negative.   Physical Exam Updated Vital Signs BP 121/87 (BP Location: Right Arm)   Pulse (!) 108   Temp 98 F (36.7 C) (Oral)   Resp 18   LMP 04/23/2015 (Approximate)   SpO2 98%   Physical Exam Vitals and nursing note reviewed. Exam conducted with a chaperone present.  Constitutional:      Appearance: Normal appearance.  HENT:     Head: Normocephalic and atraumatic.  Eyes:     General: No scleral icterus.    Conjunctiva/sclera: Conjunctivae normal.  Cardiovascular:     Rate and Rhythm: Regular rhythm. Tachycardia present.     Pulses: Normal pulses.     Heart sounds: Normal heart sounds.  Pulmonary:     Effort: Pulmonary effort is normal. No respiratory distress.     Breath sounds: Normal breath sounds.  Abdominal:     Comments: Soft, nondistended.  TTP in epigastrium and LUQ.  No significant TTP elsewhere.  No overlying skin changes.  No pulsatile masses appreciated.  Musculoskeletal:     Cervical back: Normal range of motion. No rigidity.     Comments: Can move all extremities.  Strength intact against resistance.  Equal peripheral pulses bilaterally.  Skin:    General: Skin is dry.     Capillary Refill: Capillary refill takes less than 2 seconds.  Neurological:      Mental Status: She is alert and oriented to person, place, and time.     GCS: GCS eye subscore is 4. GCS verbal subscore is 5. GCS motor subscore is 6.  Psychiatric:        Mood and Affect: Mood normal.        Behavior: Behavior normal.  Thought Content: Thought content normal.     ED Results / Procedures / Treatments   Labs (all labs ordered are listed, but only abnormal results are displayed) Labs Reviewed  COMPREHENSIVE METABOLIC PANEL - Abnormal; Notable for the following components:      Result Value   Sodium 121 (*)    Potassium 3.0 (*)    Chloride 73 (*)    CO2 33 (*)    Glucose, Bld 110 (*)    All other components within normal limits  CBC - Abnormal; Notable for the following components:   WBC 17.1 (*)    RBC 3.51 (*)    Hemoglobin 11.2 (*)    HCT 31.6 (*)    Platelets 431 (*)    All other components within normal limits  URINALYSIS, ROUTINE W REFLEX MICROSCOPIC - Abnormal; Notable for the following components:   Color, Urine STRAW (*)    Specific Gravity, Urine 1.002 (*)    Hgb urine dipstick SMALL (*)    Ketones, ur 20 (*)    Bacteria, UA RARE (*)    All other components within normal limits  I-STAT BETA HCG BLOOD, ED ([facility], [facility], [facility] ONLY) - Abnormal; Notable for the following components:   I-stat hCG, quantitative 22.8 (*)    All other components within normal limits  SARS CORONAVIRUS 2 BY RT PCR (HOSPITAL ORDER, [place] [facility])  LIPASE, BLOOD  ETHANOL  PREGNANCY, URINE    EKG None  Radiology No results found.  Procedures .Critical Care Performed by: Corena Herter, PA-C Authorized by: Corena Herter, PA-C   Critical care provider statement:    Critical care time (minutes):  45   Critical care was necessary to treat or prevent imminent or life-threatening deterioration of the following conditions:  Metabolic crisis   Critical care was time spent personally by me on the following activities:  Discussions with  consultants, evaluation of patient's response to treatment, examination of patient, ordering and performing treatments and interventions, ordering and review of laboratory studies, ordering and review of radiographic studies, pulse oximetry, re-evaluation of patient's condition, obtaining history from patient or surrogate and review of old charts Comments:     Hyponatremia.   (including critical care time)  Medications Ordered in ED Medications  pantoprazole (PROTONIX) 80 mg in sodium chloride 0.9 % 100 mL IVPB (80 mg Intravenous New Bag/Given 03/16/20 1405)  pantoprazole (PROTONIX) 80 mg in sodium chloride 0.9 % 100 mL (0.8 mg/mL) infusion (has no administration in time range)  pantoprazole (PROTONIX) injection 40 mg (has no administration in time range)  sucralfate (CARAFATE) tablet 1 g (1 g Oral Not Given 03/16/20 1249)  potassium chloride 10 mEq in 100 mL IVPB (10 mEq Intravenous New Bag/Given 03/16/20 1405)  sodium chloride 0.9 % bolus 1,000 mL ( Intravenous Restarted 03/16/20 1405)  morphine 2 MG/ML injection 2 mg (2 mg Intravenous Given 03/16/20 1258)  LORazepam (ATIVAN) injection 1 mg (1 mg Intravenous Given 03/16/20 1418)    ED Course  I have reviewed the triage vital signs and the nursing notes.  Pertinent labs & imaging results that were available during my care of the patient were reviewed by me and considered in my medical decision making (see chart for details).  Clinical Course as of Mar 17 1423  Sat Mar 16, 2020  1359 Spoke with Theodis Aguas, PA-C who will see patient.  Plan is for admission to hospitalist while they plan for likely EGD.   [GG]  Clinical Course User Index [GG] Corena Herter, PA-C   MDM Rules/Calculators/A&P                          Patient's history and physical exam is suspicious for active PUD.  Will start patient on IV Protonix bolus and infusion.  We will also provide Carafate and morphine for additional pain relief.    Patient states that she  was unable to tolerate the Carafate by mouth due to difficulty swallowing. She has a history of esophageal strictures from her chronic GERD that have required dilation in the past.  Labs CMP: Hyponatremia 121, decreased from her baseline 130s. Hyponatremic to 3.0.  CBC: Leukocytosis 17.1. Mild anemia to 11.2 hemoglobin, but improved when compared to baseline. Lipase: WNL. Ethanol: < 10 UA: Unremarkable.   Patient's peripheral pulses are symmetric bilaterally.  Her description of pain and history of esophagitis, esophageal strictures, and PUD is consistent with her current episode of pain symptoms.  She also endorses taking NSAIDs recently in addition to her typical tobacco use.  While she denies any obvious melena, she had multiple episodes of coffee-ground emesis yesterday which also supports likely PUD.  Given her hyponatremia 121, will also like patient to be admitted to medicine for further work-up and management.  Will first consult with her gastroenterologist, Dr. Silverio Decamp.    Spoke with Theodis Aguas PA, [facility], who will see patient.  Plan is for admission to hospitalist while they plan for likely EGD.    Hospitalist was consulted and will see and admit patient.     Final Clinical Impression(s) / ED Diagnoses Final diagnoses:  Peptic ulcer disease  Hyponatremia    Rx / DC Orders ED Discharge Orders    None       Corena Herter, PA-C 03/16/20 1424    Corena Herter, PA-C 03/16/20 [place], [place], DO 03/16/20 [place], [place], DO 03/16/20 1432

## 2020-03-16 NOTE — ED Notes (Signed)
Per GI PA continue the protonix drip until it runs out.

## 2020-03-16 NOTE — ED Notes (Signed)
ED TO INPATIENT HANDOFF REPORT  Name/Age/Gender Robin Welch 54 y.o. female  Code Status    Code Status Orders  (From admission, onward)         Start     Ordered   03/16/20 1533  Full code  Continuous        03/16/20 1533        Code Status History    Date Active Date Inactive Code Status Order ID Comments User Context   07/18/2019 1557 07/20/2019 1537 Full Code [id number]  Truddie Hidden, MD Inpatient   07/08/2019 1831 07/09/2019 1736 Full Code [id number]  Rosalin Hawking ED   07/08/2019 1624 07/08/2019 1831 Full Code [id number]  Donella Stade, PA-C ED   03/11/2017 2013 03/12/2017 1627 Full Code [id number]  Modena Jansky, MD Inpatient   Advance Care Planning Activity      Home/SNF/Other Home  Chief Complaint Hyponatremia [E87.1]  Level of Care/Admitting Diagnosis ED Disposition    ED Disposition Condition [place]: [facility] [100102]  Level of Care: Med-Surg [16]  May admit patient to [facility] or [facility] if equivalent level of care is available:: Yes  Covid Evaluation: Asymptomatic Screening Protocol (No Symptoms)  Diagnosis: Hyponatremia [233007]  Admitting Physician: Donne Hazel [[id number]]  Attending Physician: Donne Hazel [[id number]]  Estimated length of stay: 3 - 4 days  Certification:: I certify this patient will need inpatient services for at least 2 midnights       Medical History Past Medical History:  Diagnosis Date  . Anemia   . Anxiety   . Barrett esophagus   . Bipolar 1 disorder [facility])    sees Dr. Candis Schatz psych [phone])  . Closed fracture distal radius and ulna, left, initial encounter 04/20/2019   S/p ORIF 04/2019 [facility])   . COPD (chronic obstructive pulmonary disease) ([place]) 03/2013   hyperinflation by CXR  . Elevated alkaline phosphatase level   . Esophagitis   . Hiatal hernia   . History of cocaine dependence ([place]) latest 2020   undergoing detox  . History of stomach  ulcers   . Hx: UTI (urinary tract infection)   . Migraine    "stopped in ~ 2008" (03/11/2017)  . Muscle strain of gluteal region, right, subsequent encounter 10/05/2018   By MRI 2020  . Osteoporosis 06/09/2018   DEXA 05/2017 - T score -2.5 L hip, -1.3 spine  . Positive PPD 1989   s/p CXR and INH 6 mo  . Seizure ([place]) X 1   "from takiing too many headache medicine?" (03/11/2017)  . Smoker     Allergies Allergies  Allergen Reactions  . Prozac [Fluoxetine Hcl] Swelling and Rash  . Doxycycline Nausea And Vomiting  . Celexa [Citalopram Hydrobromide] Other (See Comments)    Possible allergic reaction  . Chantix [Varenicline Tartrate] Swelling    Mouth swelling  . Hydroxyzine Other (See Comments)    Not effective  . Klonopin [Clonazepam] Other (See Comments)    Caused instablility. Not able to drive due to over medicated  . Lunesta [Eszopiclone] Other (See Comments)    Not effective  . Sonata [Zaleplon] Other (See Comments)    Not effective  . Trazodone And Nefazodone Other (See Comments)    ineffective  . Zolpimist [Zolpidem Tartrate] Other (See Comments)    Not effective  . Azithromycin Other (See Comments)    Reaction not recalled by the patient    IV Location/Drains/Wounds Patient Lines/Drains/Airways  Status    Active Line/Drains/Airways    Name Placement date Placement time Site Days   Peripheral IV 03/16/20 Right Antecubital 03/16/20  1403  Antecubital  less than 1   Incision (Closed) 04/08/19 Arm Right 04/08/19  1451   343          Labs/Imaging Results for orders placed or performed during the hospital encounter of 03/16/20 (from the past 48 hour(s))  Lipase, blood     Status: None   Collection Time: 03/16/20  9:15 AM  Result Value Ref Range   Lipase 18 11 - 51 U/L    Comment: Performed at [facility], [place] [address], [place], [place] [address]  Comprehensive metabolic panel     Status: Abnormal   Collection Time: 03/16/20  9:15 AM  Result  Value Ref Range   Sodium 121 (L) 135 - 145 mmol/L   Potassium 3.0 (L) 3.5 - 5.1 mmol/L   Chloride 73 (L) 98 - 111 mmol/L   CO2 33 (H) 22 - 32 mmol/L   Glucose, Bld 110 (H) 70 - 99 mg/dL    Comment: Glucose reference range applies only to samples taken after fasting for at least 8 hours.   BUN 12 6 - 20 mg/dL   Creatinine, Ser 0.53 0.44 - 1.00 mg/dL   Calcium 9.0 8.9 - 10.3 mg/dL   Total Protein 7.3 6.5 - 8.1 g/dL   Albumin 3.7 3.5 - 5.0 g/dL   AST 26 15 - 41 U/L   ALT 17 0 - 44 U/L   Alkaline Phosphatase 125 38 - 126 U/L   Total Bilirubin 0.9 0.3 - 1.2 mg/dL   GFR calc non Af Amer >60 >60 mL/min   GFR calc Af Amer >60 >60 mL/min   Anion gap 15 5 - 15    Comment: Performed at [facility], [place] [address] [place], [place] [address]  CBC     Status: Abnormal   Collection Time: 03/16/20  9:15 AM  Result Value Ref Range   WBC 17.1 (H) 4.0 - 10.5 K/uL   RBC 3.51 (L) 3.87 - 5.11 MIL/uL   Hemoglobin 11.2 (L) 12.0 - 15.0 g/dL   HCT 31.6 (L) 36 - 46 %   MCV 90.0 80.0 - 100.0 fL   MCH 31.9 26.0 - 34.0 pg   MCHC 35.4 30.0 - 36.0 g/dL   RDW 12.5 11.5 - 15.5 %   Platelets 431 (H) 150 - 400 K/uL   nRBC 0.0 0.0 - 0.2 %    Comment: Performed at [facility], [place] [address], [place], [place] [address]  Urinalysis, Routine w reflex microscopic     Status: Abnormal   Collection Time: 03/16/20  9:15 AM  Result Value Ref Range   Color, Urine STRAW (A) YELLOW   APPearance CLEAR CLEAR   Specific Gravity, Urine 1.002 (L) 1.005 - 1.030   pH 7.0 5.0 - 8.0   Glucose, UA NEGATIVE NEGATIVE mg/dL   Hgb urine dipstick SMALL (A) NEGATIVE   Bilirubin Urine NEGATIVE NEGATIVE   Ketones, ur 20 (A) NEGATIVE mg/dL   Protein, ur NEGATIVE NEGATIVE mg/dL   Nitrite NEGATIVE NEGATIVE   Leukocytes,Ua NEGATIVE NEGATIVE   RBC / HPF 0-5 0 - 5 RBC/hpf   WBC, UA 0-5 0 - 5 WBC/hpf   Bacteria, UA RARE (A) NONE SEEN   Squamous Epithelial / LPF 0-5 0 - 5    Comment: Performed at  [facility], [address] [place], [place] [address]  Ethanol     Status: None   Collection Time: 03/16/20  9:15 AM  Result Value Ref Range   Alcohol, Ethyl (B) <10 <10 mg/dL    Comment: (NOTE) Lowest detectable limit for serum alcohol is 10 mg/dL.  For medical purposes only. Performed at [facility], [place] [address] [place], [place] [address]   I-Stat beta hCG blood, ED     Status: Abnormal   Collection Time: 03/16/20 10:28 AM  Result Value Ref Range   I-stat hCG, quantitative 22.8 (H) <5 mIU/mL   Comment 3            Comment:   GEST. AGE      CONC.  (mIU/mL)   <=1 WEEK        5 - 50     2 WEEKS       50 - 500     3 WEEKS       100 - 10,000     4 WEEKS     1,000 - 30,000        FEMALE AND NON-PREGNANT FEMALE:     LESS THAN 5 mIU/mL   Pregnancy, urine     Status: None   Collection Time: 03/16/20 10:43 AM  Result Value Ref Range   Preg Test, Ur NEGATIVE NEGATIVE    Comment:        THE SENSITIVITY OF THIS METHODOLOGY IS >20 mIU/mL. Performed at [facility], [place] [address], [place],  [address]    DG Chest 2 View  Result Date: 03/16/2020 CLINICAL DATA:  Productive cough. EXAM: CHEST - 2 VIEW COMPARISON:  May 05, 2018 FINDINGS: Cardiomediastinal silhouette is normal. Mediastinal contours appear intact. There is no evidence of pleural effusion or pneumothorax. Diffuse bilateral patchy widespread airspace opacities. Osteolytic lesion of the right proximal humerus, at the site of previously noted right proximal humeral fracture. Soft tissues are grossly normal. IMPRESSION: Diffuse bilateral patchy widespread airspace opacities concerning for multifocal possibly atypical pneumonia or septic emboli. Osteolytic lesion of the right proximal humerus, at the site of previously noted right proximal humerus fracture, with abnormal appearance of the distal humeral cortex. Dedicated humeral radiographs may be considered, if  this is an unexpected finding. Electronically Signed   By: Fidela Salisbury M.D.   On: 03/16/2020 15:55    Pending Labs Unresulted Labs (From admission, onward) Comment          Start     Ordered   03/17/20 0500  Comprehensive metabolic panel  Tomorrow morning,   R        03/16/20 1533   03/17/20 0500  CBC  Tomorrow morning,   R        03/16/20 1533   03/17/20 0500  Magnesium  Tomorrow morning,   R        03/16/20 1616   03/16/20 1602  HIV Antibody (routine testing w rflx)  (HIV Antibody (Routine testing w reflex) panel)  Once,   STAT        03/16/20 1603   03/16/20 1602  Culture, blood (routine x 2) Call MD if unable to obtain prior to antibiotics being given  BLOOD CULTURE X 2,   R (with STAT occurrences)     Comments: If blood cultures drawn in Emergency Department - Do not draw and cancel order    03/16/20 1603   03/16/20 1602  Legionella Pneumophila Serogp 1 Ur Ag  Once,  STAT        03/16/20 1603   03/16/20 1602  Strep pneumoniae urinary antigen  Once,   STAT        03/16/20 1603   03/16/20 1536  Osmolality  Once,   STAT        03/16/20 1535   03/16/20 1536  Osmolality, urine  Once,   STAT        03/16/20 1535   03/16/20 1536  Sodium, urine, random  Once,   STAT        03/16/20 1535   03/16/20 1403  SARS Coronavirus 2 by RT PCR (hospital order, performed in [place] hospital lab) Nasopharyngeal Nasopharyngeal Swab  (Tier 2 (TAT 2 hrs))  Once,   STAT       Question Answer Comment  Is this test for diagnosis or screening Screening   Symptomatic for COVID-19 as defined by CDC No   Hospitalized for COVID-19 No   Admitted to ICU for COVID-19 No   Previously tested for COVID-19 Yes   Resident in a congregate (group) care setting No   Employed in healthcare setting No   Pregnant No   Has patient completed COVID vaccination(s) (2 doses of Pfizer/Moderna 1 dose of [employer]) Unknown      03/16/20 1403          Vitals/Pain Today's Vitals   03/16/20 1404  03/16/20 1414 03/16/20 1445 03/16/20 1530  BP:  121/87 126/76 121/80  Pulse:  (!) 108 (!) 111 (!) 108  Resp:  18 16 18   Temp:  98 F (36.7 C)    TempSrc:  Oral    SpO2:  98% 98% 96%  PainSc: 7        Isolation Precautions No active isolations  Medications Medications  sucralfate (CARAFATE) tablet 1 g (1 g Oral Not Given 03/16/20 1249)  potassium chloride 10 mEq in 100 mL IVPB (10 mEq Intravenous New Bag/Given 03/16/20 1516)  pantoprazole (PROTONIX) injection 40 mg (has no administration in time range)  acetaminophen (TYLENOL) tablet 650 mg (has no administration in time range)    Or  acetaminophen (TYLENOL) suppository 650 mg (has no administration in time range)  QUEtiapine (SEROQUEL) tablet 800 mg (has no administration in time range)  cariprazine (VRAYLAR) capsule 3 mg (has no administration in time range)  0.9 %  sodium chloride infusion (has no administration in time range)  albuterol (PROVENTIL) (2.5 MG/3ML) 0.083% nebulizer solution 2.5 mg (has no administration in time range)  sucralfate (CARAFATE) tablet 1 g (has no administration in time range)  Ampicillin-Sulbactam (UNASYN) 3 g in sodium chloride 0.9 % 100 mL IVPB (has no administration in time range)  sodium chloride 0.9 % bolus 1,000 mL (0 mLs Intravenous Stopped 03/16/20 1535)  pantoprazole (PROTONIX) 80 mg in sodium chloride 0.9 % 100 mL IVPB (0 mg Intravenous Stopped 03/16/20 1429)  morphine 2 MG/ML injection 2 mg (2 mg Intravenous Given 03/16/20 1258)  LORazepam (ATIVAN) injection 1 mg (1 mg Intravenous Given 03/16/20 1418)    Mobility walks

## 2020-03-16 NOTE — ED Triage Notes (Signed)
She c/o abd. Pain. She acts and speaks as one who has been drinking ETOH. She is ambulatory and in no distress.

## 2020-03-16 NOTE — H&P (Addendum)
History and Physical    Robin Welch LEX:[id number] DOB: 24-Oct-1965 DOA: 03/16/2020  PCP: Ria Bush, MD  Patient coming from: Home  Chief Complaint: N/V  HPI: Robin Welch is a 54 y.o. female with medical history significant of bipolar 1, anxiety, esophageal strictures requiring dilation, COPD, chronic hyponatremia who presented to the ED with one day hx of multiple episodes of n/v with coffee-ground appearing emesis that started 7/16. Pt seems somewhat confused currently, but denies abd pain. Pt does report decreased PO intake and feels thirsty currently. Denies chest pains, syncope, recent seizures. Claims to be complaint with home meds  ED Course: In the ED, pt found to have Na of 121 (was 129 on 10/09/19), K of 3.0, WBC 17.1 with hgb 11.2. CXR in ED reviewed, findings of diffuse bilateral patchy widespread airspace opacities concerning for multifocal possibility atypical PNA or spetic emboli. Hospitalist consulted for consideration for admission  Review of Systems:  Review of Systems  Unable to perform ROS: Psychiatric disorder (bipolar 1, somewhat confused, difficult to obtain accurate ROS)    Past Medical History:  Diagnosis Date  . Anemia   . Anxiety   . Barrett esophagus   . Bipolar 1 disorder [facility])    sees Dr. Candis Schatz psych [phone])  . Closed fracture distal radius and ulna, left, initial encounter 04/20/2019   S/p ORIF 04/2019 [facility])   . COPD (chronic obstructive pulmonary disease) ([place]) 03/2013   hyperinflation by CXR  . Elevated alkaline phosphatase level   . Esophagitis   . Hiatal hernia   . History of cocaine dependence ([place]) latest 2020   undergoing detox  . History of stomach ulcers   . Hx: UTI (urinary tract infection)   . Migraine    "stopped in ~ 2008" (03/11/2017)  . Muscle strain of gluteal region, right, subsequent encounter 10/05/2018   By MRI 2020  . Osteoporosis 06/09/2018   DEXA 05/2017 - T score -2.5 L hip, -1.3 spine  . 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 Seizure ([place]) X 1   "from takiing too many headache medicine?" (03/11/2017)  . Smoker     Past Surgical History:  Procedure Laterality Date  . BIOPSY  07/19/2019   Procedure: BIOPSY;  Surgeon: Gatha Mayer, MD;  Location: [facility];  Service: Endoscopy;;  . COLONOSCOPY  07/2018   poor colon prep - rec repeat (Nandigam)  . DILATION AND CURETTAGE OF UTERUS  X 1   S/P miscarriage  . ESOPHAGOGASTRODUODENOSCOPY  03/2017   candidal esophagitis, treated, LA grade D esophagitis, 4cm HH (Nandigam)  . ESOPHAGOGASTRODUODENOSCOPY  07/2018   severe reflux esophagitis, neg candida (N[facility]igam)  . ESOPHAGOGASTRODUODENOSCOPY (EGD) WITH PROPOFOL N/A 07/19/2019   Procedure: ESOPHAGOGASTRODUODENOSCOPY (EGD) WITH PROPOFOL;  Surgeon: Gatha Mayer, MD;  Location: [facility];  Service: Endoscopy;  Laterality: N/A;  . ESOPHAGOGASTRODUODENOSCOPY (EGD) WITH PROPOFOL N/A 10/24/2019   Procedure: ESOPHAGOGASTRODUODENOSCOPY (EGD) WITH PROPOFOL;  Surgeon: Mauri Pole, MD;  Location: [facility];  Service: Endoscopy;  Laterality: N/A;  . ESOPHAGOGASTRODUODENOSCOPY (EGD) WITH PROPOFOL N/A 11/21/2019   Procedure: ESOPHAGOGASTRODUODENOSCOPY (EGD) WITH PROPOFOL;  Surgeon: Mauri Pole, MD;  Location: [facility];  Service: Endoscopy;  Laterality: N/A;  . LAPAROSCOPIC CHOLECYSTECTOMY  2006  . ORIF WRIST FRACTURE Right 04/08/2019   Procedure: OPEN REDUCTION INTERNAL FIXATION (ORIF) DISTAL RADIUS/ULNA FRACTURE;  Surgeon: Verner Mould, MD;  Location: [facility];  Service: Orthopedics;  Laterality: Right;  . TUBAL LIGATION  2003  reports that she has been smoking cigarettes. She has a 36.00 pack-year smoking history. She has never used smokeless tobacco. She reports previous alcohol use. She reports current drug use. Drug: Cocaine.  Allergies  Allergen Reactions  . Prozac [Fluoxetine Hcl] Swelling and Rash  . Doxycycline Nausea And Vomiting  . Celexa [Citalopram  Hydrobromide] Other (See Comments)    Possible allergic reaction  . Chantix [Varenicline Tartrate] Swelling    Mouth swelling  . Hydroxyzine Other (See Comments)    Not effective  . Klonopin [Clonazepam] Other (See Comments)    Caused instablility. Not able to drive due to over medicated  . Lunesta [Eszopiclone] Other (See Comments)    Not effective  . Sonata [Zaleplon] Other (See Comments)    Not effective  . Trazodone And Nefazodone Other (See Comments)    ineffective  . Zolpimist [Zolpidem Tartrate] Other (See Comments)    Not effective  . Azithromycin Other (See Comments)    Reaction not recalled by the patient    Family History  Problem Relation Age of Onset  . Diabetes Mother        T2DM  . Breast cancer Mother 69  . Dementia Mother   . Lymphoma Mother   . Coronary artery disease Father 55       MI  . Anxiety disorder Paternal Aunt   . Anxiety disorder Cousin   . Depression Cousin   . ADD / ADHD Child   . Anxiety disorder Child   . Depression Child   . Stroke Neg Hx   . Colon cancer Neg Hx   . Cancer - Colon Neg Hx   . Esophageal cancer Neg Hx   . Stomach cancer Neg Hx   . Rectal cancer Neg Hx     Prior to Admission medications   Medication Sig Start Date End Date Taking? Authorizing Provider  albuterol (VENTOLIN HFA) 108 (90 Base) MCG/ACT inhaler INHALE 2 PUFFS BY MOUTH EVERY 6 HOURS AS NEEDED FOR WHEEZING OR SHORTNESS OF BREATH Patient taking differently: Inhale 2 puffs into the lungs every 4 (four) hours as needed for wheezing.  08/15/19  Yes Ria Bush, MD  cariprazine [facility]) capsule Take 3 mg by mouth daily. 11/06/19  Yes [provider]  Cholecalciferol (VITAMIN D) 50 MCG (2000 UT) CAPS Take 1 capsule (2,000 Units total) by mouth daily. Patient taking differently: Take 2,000 Units by mouth daily.  07/06/19  Yes Ria Bush, MD  cholestyramine light (PREVALITE) 4 g packet Take 0.5 packets (2 g total) by mouth 2 (two) times daily.  11/21/19  Yes Mauri Pole, MD  esomeprazole (NEXIUM) 40 MG packet Take 40 mg by mouth 2 (two) times daily at 8 am and 10 pm. 11/21/19  Yes Nandigam, Venia Minks, MD  Multiple Vitamin (MULTIVITAMIN WITH MINERALS) TABS tablet Take 1 tablet by mouth daily.   Yes [provider]  QUEtiapine (SEROQUEL) 200 MG tablet TAKE 4 TABLETS AT BEDTIME Patient taking differently: Take 800 mg by mouth at bedtime.  02/28/20  Yes Thayer Headings, PMHNP  calcium carbonate (CALCIUM 600) 600 MG TABS tablet Take 1 tablet (600 mg total) by mouth daily with breakfast. 07/08/19   Ria Bush, MD  sucralfate (CARAFATE) 1 g tablet Take 1 tablet (1 g total) by mouth 4 (four) times daily. 11/21/19 11/20/20  Mauri Pole, MD  Suvorexant (BELSOMRA) 15 MG TABS Take 15 mg by mouth at bedtime. 12/05/19   Thayer Headings, PMHNP  Tiotropium Bromide Monohydrate (SPIRIVA RESPIMAT) 2.5  MCG/ACT AERS Inhale 1 puff into the lungs daily. Patient not taking: Reported on 10/17/2019 07/06/19   Ria Bush, MD    Physical Exam: Vitals:   03/16/20 1403 03/16/20 1414 03/16/20 1445 03/16/20 1530  BP: 121/87 121/87 126/76 121/80  Pulse: (!) 108 (!) 108 (!) 111 (!) 108  Resp: 18 18 16 18   Temp:  98 F (36.7 C)    TempSrc:  Oral    SpO2: 95% 98% 98% 96%    Constitutional: NAD, calm, comfortable Vitals:   03/16/20 1403 03/16/20 1414 03/16/20 1445 03/16/20 1530  BP: 121/87 121/87 126/76 121/80  Pulse: (!) 108 (!) 108 (!) 111 (!) 108  Resp: 18 18 16 18   Temp:  98 F (36.7 C)    TempSrc:  Oral    SpO2: 95% 98% 98% 96%   Eyes: PERRL, lids and conjunctivae normal ENMT: Mucous membranes are dry. .Normal dentition.  Neck: normal, supple, no masses Respiratory: clear to auscultation bilaterally, no wheezing,normal resp effort  Cardiovascular: Regular rate and rhythm, s1, s2 Abdomen: no tenderness, pos BS Musculoskeletal: no clubbing / cyanosis. No joint deformity upper and lower extremities. Good ROM, no  contractures. Normal muscle tone.  Skin: no rashes, lesions. No induration Neurologic: CN 2-12 grossly intact. Sensation intact,. Strength 5/5 in all 4.  Psychiatric: Seems confused, normal affect   Labs on Admission: I have personally reviewed following labs and imaging studies  CBC: Recent Labs  Lab 03/16/20 0915  WBC 17.1*  HGB 11.2*  HCT 31.6*  MCV 90.0  PLT 092*   Basic Metabolic Panel: Recent Labs  Lab 03/16/20 0915  NA 121*  K 3.0*  CL 73*  CO2 33*  GLUCOSE 110*  BUN 12  CREATININE 0.53  CALCIUM 9.0   GFR: CrCl cannot be calculated (Unknown ideal weight.). Liver Function Tests: Recent Labs  Lab 03/16/20 0915  AST 26  ALT 17  ALKPHOS 125  BILITOT 0.9  PROT 7.3  ALBUMIN 3.7   Recent Labs  Lab 03/16/20 0915  LIPASE 18   No results for input(s): AMMONIA in the last 168 hours. Coagulation Profile: No results for input(s): INR, PROTIME in the last 168 hours. Cardiac Enzymes: No results for input(s): CKTOTAL, CKMB, CKMBINDEX, TROPONINI in the last 168 hours. BNP (last 3 results) No results for input(s): PROBNP in the last 8760 hours. HbA1C: No results for input(s): HGBA1C in the last 72 hours. CBG: No results for input(s): GLUCAP in the last 168 hours. Lipid Profile: No results for input(s): CHOL, HDL, LDLCALC, TRIG, CHOLHDL, LDLDIRECT in the last 72 hours. Thyroid Function Tests: No results for input(s): TSH, T4TOTAL, FREET4, T3FREE, THYROIDAB in the last 72 hours. Anemia Panel: No results for input(s): VITAMINB12, FOLATE, FERRITIN, TIBC, IRON, RETICCTPCT in the last 72 hours. Urine analysis:    Component Value Date/Time   COLORURINE STRAW (A) 03/16/2020 0915   APPEARANCEUR CLEAR 03/16/2020 0915   LABSPEC 1.002 (L) 03/16/2020 0915   PHURINE 7.0 03/16/2020 0915   GLUCOSEU NEGATIVE 03/16/2020 0915   HGBUR SMALL (A) 03/16/2020 0915   BILIRUBINUR NEGATIVE 03/16/2020 0915   BILIRUBINUR negative 07/13/2019 1219   KETONESUR 20 (A) 03/16/2020 0915    PROTEINUR NEGATIVE 03/16/2020 0915   UROBILINOGEN 0.2 07/13/2019 1219   UROBILINOGEN 0.2 12/27/2010 1113   NITRITE NEGATIVE 03/16/2020 0915   LEUKOCYTESUR NEGATIVE 03/16/2020 0915   Sepsis Labs: !!!!!!!!!!!!!!!!!!!!!!!!!!!!!!!!!!!!!!!!!!!! @LABRCNTIP (procalcitonin:4,lacticidven:4) )No results found for this or any previous visit (from the past 240 hour(s)).   Radiological Exams on Admission: DG  Chest 2 View  Result Date: 03/16/2020 CLINICAL DATA:  Productive cough. EXAM: CHEST - 2 VIEW COMPARISON:  May 05, 2018 FINDINGS: Cardiomediastinal silhouette is normal. Mediastinal contours appear intact. There is no evidence of pleural effusion or pneumothorax. Diffuse bilateral patchy widespread airspace opacities. Osteolytic lesion of the right proximal humerus, at the site of previously noted right proximal humeral fracture. Soft tissues are grossly normal. IMPRESSION: Diffuse bilateral patchy widespread airspace opacities concerning for multifocal possibly atypical pneumonia or septic emboli. Osteolytic lesion of the right proximal humerus, at the site of previously noted right proximal humerus fracture, with abnormal appearance of the distal humeral cortex. Dedicated humeral radiographs may be considered, if this is an unexpected finding. Electronically Signed   By: Fidela Salisbury M.D.   On: 03/16/2020 15:55    Assessment/Plan Principal Problem:   Hematemesis Active Problems:   Bipolar 1 disorder (HCC)   COPD (chronic obstructive pulmonary disease) with chronic bronchitis (HCC)   Non-intractable vomiting with nausea   Chronic hyponatremia   GAD (generalized anxiety disorder)   Hyponatremia   Hypokalemia   Sepsis due to pneumonia ([place])  1. Hematemesis 1. Reported coffee ground emesis day prior to admit 2. Presenting hgb of 11.2 3. Well known to [facility] service. GI was consulted by EDP 4. GI recs to cont clears, f/u on abd and chest imaging for now 5. Will repeat CBC in  AM 6. Continue on IV PPI with carafate per home regimen 2. Acutely worsened hyponatremia, hx of chronic hyponatremia 1. Dry mucus membranes with hx of poor po intake 2. Likely hypovolemic hyponatremia 3. Will check serum and urine osm, urine Na 4. Cont on basal NS at 75cc/hr 5. Repeat bmet in AM 3. COPD 1. On minimal O2 support 2. Cont PRN albuterol MDI 4. Bipolar 1 with anxiety 1. Seems stable 2. Will continue home bipolar meds 5. Dehydration 1. Likely secondary to presenting decreased PO intake 6. PNA with sepsis present on admission 1. Pt tachycardic with leukocytosis and CXR findings of PNA 2. Given presenting n/v, have concern for likely aspiration PNA 3. Will continue patient on empiric unasyn 4. Repeat CBC in AM 5. Blood cx, urine legionella and strep ag pending 7. Hypokalemia 1. Potassium of 3.0 2. Currently receiving 76meq of KCl IV 3. Repeat bmet in AM 4. Will check Mg level  DVT prophylaxis: SCD's  Code Status: Full Family Communication: Pt in room, family not at bedside  Disposition Plan: Uncertain at this time  Consults called: GI Admission status: Inpatient, as would likely require greater than 2 midnight stay for IVF to treat worsened hyponatremia, dehydration which otherwise could lead to increased morbidity such as seizures, renal failure death. Also for further GI work up of hematemesis.   Marylu Lund MD [facility] Pager On Amion  If 7PM-7AM, please contact night-coverage  03/16/2020, 4:12 PM

## 2020-03-16 NOTE — Consult Note (Addendum)
[facility] Consult: 1:59 PM 03/16/2020  LOS: 0 days    Referring Provider: ED PA  Primary Care Physician:  Ria Bush, MD Primary Gastroenterologist:  Dr. Silverio Decamp   Reason for Consultation:  Abdominal pain and CGE   HPI: Robin Welch is a 54 y.o. female.  Hx COPD.  Bipolar, anxiety.  Substance abuse.  Thrombocytosis.  S/p cholecystectomy.    Severe GERD w hx Hematemesis/coffee-ground emesis, anemia (has not required transfusion).    07/19/2019 EGD.  Revealed LA grade D esophagitis with bleeding at the lower esophagus.  Severe, inflammatory stenosis (measuring 1 cm inner diameter) at 30 cm from incisors, this was not dilated.  Medium hiatal hernia.  Pathology showed glandular type epithelium, detached fragment of benign inflamed granulation tissue.  Stains for HSV, CMV, fungal elements negative Treated with PPI bid, Carafate.  09/29/2019 EGD.  To address dysphagia.  Encountered esophageal ulcers with oozing blood, benign looking esophageal stenosis.  No dilation performed.  She was switched to esomeprazole packets 40 mg bid.  Restarted on 4 times daily Carafate.    10/24/2019 EGD.  Still with grade D esophagitis but no bleeding, appearance slightly improved.  Benign appearing intrinsic, severe stenosis found 28 to 31 cm from incisors, narrowest stenosis 5 mm inner diameter.  No attempt to dilate given mucosal friability and severe erosive esophagitis.  Medium sized hiatal hernia.  Recommendation to continue the Nexium and Carafate regimen.  11/21/2019 EGD.  Benign, intrinsic severe stenosis at 30 to 35 cm from incisors, narrowest diameter 5 mm balloon dilation performed with 6-7-8 mm balloon.  Persistent but improved ulceration w multiple cratered, linear, superficial ulcers at 25 to 35 cm from incisors,  largest 10 mm diameter. Plan was to continue Nexium packets bid, AC at bedtime Carafate, small frequent meals, Prevalite 2 g bid.   05/2018 colonoscopy.  Screening study.  Poor prep with stool in the rectum and sigmoid precluding visualization.  Plan repeat w extended prep: has never had fup colonoscopy.  In [facility] ED today w abdominal pain.  Altered mental status. Peri-umbilical pain for 3 days, not much emesis but CGE reported.  No melena, BPR.  Endorses compliance w PPI and Carafate.  Also says she is taking Aleve, Ibuprofen but not able to tell how much/often. Denies dysphagia.  + cough and excessive oropharyngeal secretions.  Told Rn she had skipped bipolar meds for the last few days.     Hgb 11.2.  Was 10.5 seven months ago in Jan 2021, 8.3 in 07/2019 Na 121, K 3, chloride 73.  Calcium, BUN/Creat ok.   WBCs 17.1. Elevated I stat hCG, negative urine pregnancy.    Social Lives alone.  Unemployed.  Previous Pharmacist, hospital. Denies drugs (including Rx narcotics) and ETOH.   Family Hx No colorectal dz or cancers.  No GERD, no PUD.  Lots of anxiety, depression.        Past Medical History:  Diagnosis Date   Anemia    Anxiety    Barrett esophagus    Bipolar 1 disorder ([place])    sees Dr.  Cunningham psych [phone])   Closed fracture distal radius and ulna, left, initial encounter 04/20/2019   S/p ORIF 04/2019 Je[facility] Fend)    COPD (chronic obstructive pulmonary disease) ([place]) 03/2013   hyperinflation by CXR   Elevated alkaline phosphatase level    Esophagitis    Hiatal hernia    History of cocaine dependence ([place]) latest 2020   undergoing detox   History of stomach ulcers    Hx: UTI (urinary tract infection)    Migraine    "stopped in ~ 2008" (03/11/2017)   Muscle strain of gluteal region, right, subsequent encounter 10/05/2018   By MRI 2020   Osteoporosis 06/09/2018   DEXA 05/2017 - T score -2.5 L hip, -1.3 spine   Positive PPD 1989   s/p CXR and INH 6 mo   Seizure ([place]) X 1    "from takiing too many headache medicine?" (03/11/2017)   Smoker     Past Surgical History:  Procedure Laterality Date   BIOPSY  07/19/2019   Procedure: BIOPSY;  Surgeon: Gatha Mayer, MD;  Location: [facility];  Service: Endoscopy;;   COLONOSCOPY  07/2018   poor colon prep - rec repeat (Nandigam)   DILATION AND CURETTAGE OF UTERUS  X 1   S/P miscarriage   ESOPHAGOGASTRODUODENOSCOPY  03/2017   candidal esophagitis, treated, LA grade D esophagitis, 4cm HH (Nandigam)   ESOPHAGOGASTRODUODENOSCOPY  07/2018   severe reflux esophagitis, neg candida (Nandigam)   ESOPHAGOGASTRODUODENOSCOPY (EGD) WITH PROPOFOL N/A 07/19/2019   Procedure: ESOPHAGOGASTRODUODENOSCOPY (EGD) WITH PROPOFOL;  Surgeon: Gatha Mayer, MD;  Location: [facility];  Service: Endoscopy;  Laterality: N/A;   ESOPHAGOGASTRODUODENOSCOPY (EGD) WITH PROPOFOL N/A 10/24/2019   Procedure: ESOPHAGOGASTRODUODENOSCOPY (EGD) WITH PROPOFOL;  Surgeon: Mauri Pole, MD;  Location: [facility];  Service: Endoscopy;  Laterality: N/A;   ESOPHAGOGASTRODUODENOSCOPY (EGD) WITH PROPOFOL N/A 11/21/2019   Procedure: ESOPHAGOGASTRODUODENOSCOPY (EGD) WITH PROPOFOL;  Surgeon: Mauri Pole, MD;  Location: [facility];  Service: Endoscopy;  Laterality: N/A;   LAPAROSCOPIC CHOLECYSTECTOMY  2006   ORIF WRIST FRACTURE Right 04/08/2019   Procedure: OPEN REDUCTION INTERNAL FIXATION (ORIF) DISTAL RADIUS/ULNA FRACTURE;  Surgeon: Verner Mould, MD;  Location: [facility];  Service: Orthopedics;  Laterality: Right;   TUBAL LIGATION  2003    Prior to Admission medications   Medication Sig Start Date End Date Taking? Authorizing Provider  albuterol (VENTOLIN HFA) 108 (90 Base) MCG/ACT inhaler INHALE 2 PUFFS BY MOUTH EVERY 6 HOURS AS NEEDED FOR WHEEZING OR SHORTNESS OF BREATH Patient taking differently: Inhale 2 puffs into the lungs every 4 (four) hours as needed for wheezing.  08/15/19   Ria Bush, MD  calcium carbonate  (CALCIUM 600) 600 MG TABS tablet Take 1 tablet (600 mg total) by mouth daily with breakfast. 07/08/19   Ria Bush, MD  cariprazine [facility]) capsule Take 1 capsule (3 mg total) by mouth daily. 11/06/19   Thayer Headings, PMHNP  Cholecalciferol (VITAMIN D) 50 MCG (2000 UT) CAPS Take 1 capsule (2,000 Units total) by mouth daily. Patient taking differently: Take 2,000 Units by mouth daily.  07/06/19   Ria Bush, MD  cholestyramine light (PREVALITE) 4 g packet Take 0.5 packets (2 g total) by mouth 2 (two) times daily. 11/21/19   Mauri Pole, MD  esomeprazole (NEXIUM) 40 MG packet Take 40 mg by mouth 2 (two) times daily at 8 am and 10 pm. 11/21/19   Mauri Pole, MD  Multiple Vitamin (MULTIVITAMIN WITH MINERALS) TABS tablet Take 1 tablet by mouth  daily.    [provider]  QUEtiapine (SEROQUEL) 200 MG tablet TAKE 4 TABLETS AT BEDTIME 02/28/20   Thayer Headings, PMHNP  sucralfate (CARAFATE) 1 g tablet Take 1 tablet (1 g total) by mouth 4 (four) times daily. 11/21/19 11/20/20  Mauri Pole, MD  Suvorexant (BELSOMRA) 15 MG TABS Take 15 mg by mouth at bedtime. 12/05/19   Thayer Headings, PMHNP  Tiotropium Bromide Monohydrate (SPIRIVA RESPIMAT) 2.5 MCG/ACT AERS Inhale 1 puff into the lungs daily. Patient not taking: Reported on 10/17/2019 07/06/19   Ria Bush, MD    Scheduled Meds:  LORazepam  1 mg Intravenous Once   [START ON 03/20/2020] pantoprazole  40 mg Intravenous Q12H   sucralfate  1 g Oral Once   Infusions:  pantoprozole (PROTONIX) infusion     pantoprazole (PROTONIX) IVPB     potassium chloride     PRN Meds:    Allergies as of 03/16/2020 - Review Complete 03/16/2020  Allergen Reaction Noted   Prozac [fluoxetine hcl] Swelling and Rash 12/18/2010   Doxycycline Nausea And Vomiting 10/31/2014   Celexa [citalopram hydrobromide] Other (See Comments) 05/06/2018   Chantix [varenicline tartrate] Swelling 03/28/2018   Hydroxyzine Other (See  Comments) 05/06/2018   Klonopin [clonazepam] Other (See Comments) 05/06/2018   Lunesta [eszopiclone] Other (See Comments) 05/06/2018   Sonata [zaleplon] Other (See Comments) 05/06/2018   Trazodone and nefazodone Other (See Comments) 05/06/2018   Zolpimist [zolpidem tartrate] Other (See Comments) 05/06/2018   Azithromycin Other (See Comments) 04/03/2015    Family History  Problem Relation Age of Onset   Diabetes Mother        T2DM   Breast cancer Mother 64   Dementia Mother    Lymphoma Mother    Coronary artery disease Father 1       MI   Anxiety disorder Paternal Aunt    Anxiety disorder Cousin    Depression Cousin    ADD / ADHD Child    Anxiety disorder Child    Depression Child    Stroke Neg Hx    Colon cancer Neg Hx    Cancer - Colon Neg Hx    Esophageal cancer Neg Hx    Stomach cancer Neg Hx    Rectal cancer Neg Hx     Social History   Socioeconomic History   Marital status: Divorced    Spouse name: Not on file   Number of children: 2   Years of education: bachelors   Highest education level: Not on file  Occupational History   Occupation: Product manager: [employer]    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, working on disability  Tobacco Use   Smoking status: Current Every Day Smoker    Packs/day: 1.00    Years: 36.00    Pack years: 36.00    Types: Cigarettes   Smokeless tobacco: Never Used  Scientific laboratory technician Use: Never used  Substance and Sexual Activity   Alcohol use: Not Currently    Alcohol/week: 0.0 standard drinks    Comment: 03/11/2017 "stopped 09/18/2015; never an alcoholic"   Drug use: Yes    Types: Cocaine    Comment: not currently   Sexual activity: Never    Birth control/protection: Surgical  Other Topics Concern   Not on file  Social History Narrative   Caffeine: 40 oz diet mountain dew/day   Lives with husband and 2 children, no pets       REVIEW OF SYSTEMS: Constitutional:  Weakness, no fatigue ENT:  No nose bleeds Pulm:  + cough, no purulent sputum CV:  No palpitations, no LE edema. No angina GU:  No hematuria, no frequency GI:  See HPI Heme:  No unusual bleeding or bruising. Transfusions: None Neuro:  No headaches, no peripheral tingling or numbness.  No dizziness, no syncope. Derm:  No itching, no rash or sores.  Endocrine:  No sweats or chills.  No polyuria or dysuria Immunization: Endorses having completed Covid vaccinations. Travel:  None beyond local counties in last few months.    PHYSICAL EXAM: Vital signs in last 24 hours: Vitals:   03/16/20 0911 03/16/20 1300  BP: 114/71 116/80  Pulse: (!) 108 (!) 106  Resp: 18 18  Temp: 98 F (36.7 C)   SpO2: 93% 97%   Wt Readings from Last 3 Encounters:  11/21/19 49.9 kg  10/24/19 49.9 kg  09/29/19 49.4 kg    General: Physically well-appearing.  Slurred speech Head: No facial asymmetry or swelling.  No signs of head trauma. Eyes: No icterus, no pallor.  EOMI Ears: Not hard of hearing Nose: No discharge.  Overall sounds congested in her nose and throat Mouth: Vocal quality sounds wet like she is got a lot of phlegm/secretions at the back of her throat.  However there is no visible exudates or liquid.  Tongue is midline.  Good dentition. Neck: No JVD, no masses, no thyromegaly.  No tenderness Lungs: Clear bilaterally.  Some upper expiratory wheezing.  Wet vocal quality. Heart: Slightly tacky in the low 100s, regular.  No MRG.  S1, S2 present Abdomen: Soft, ND.  Active bowel sounds.  Minimal tenderness at mid abdomen.  No HSM, masses, bruits, hernias Musc/Skeltl: No joint redness, swelling or gross deformity. Extremities: No CCE. Neurologic: Caveat is she just received Ativan.  She is oriented to self and place but not to year, month or date.  Answers sometimes inappropriate.  Speech garbled.  No tremor or asterixis.  Moves all 4 limbs with grossly full strength. Skin: No rash, no sores, no  telangiectasia. Nodes: No cervical adenopathy Psych: Cooperative, slightly anxious.  Intake/Output from previous day: No intake/output data recorded. Intake/Output this shift: No intake/output data recorded.  LAB RESULTS: Recent Labs    03/16/20 0915  WBC 17.1*  HGB 11.2*  HCT 31.6*  PLT 431*   BMET Lab Results  Component Value Date   NA 121 (L) 03/16/2020   NA 129 (L) 10/09/2019   NA 135 07/20/2019   K 3.0 (L) 03/16/2020   K 3.8 10/09/2019   K 3.9 07/20/2019   CL 73 (L) 03/16/2020   CL 94 (L) 10/09/2019   CL 106 07/20/2019   CO2 33 (H) 03/16/2020   CO2 26 10/09/2019   CO2 22 07/20/2019   GLUCOSE 110 (H) 03/16/2020   GLUCOSE 125 (H) 10/09/2019   GLUCOSE 82 07/20/2019   BUN 12 03/16/2020   BUN 10 10/09/2019   BUN <5 (L) 07/20/2019   CREATININE 0.53 03/16/2020   CREATININE 0.55 10/09/2019   CREATININE 0.35 (L) 07/20/2019   CALCIUM 9.0 03/16/2020   CALCIUM 8.9 10/09/2019   CALCIUM 7.6 (L) 07/20/2019   LFT Recent Labs    03/16/20 0915  PROT 7.3  ALBUMIN 3.7  AST 26  ALT 17  ALKPHOS 125  BILITOT 0.9   PT/INR Lab Results  Component Value Date   INR 1.2 07/19/2019   INR 1.0 07/18/2019   Hepatitis Panel No results for input(s): HEPBSAG, HCVAB, HEPAIGM, HEPBIGM in  the last 72 hours. C-Diff No components found for: CDIFF Lipase     Component Value Date/Time   LIPASE 18 03/16/2020 0915    Drugs of Abuse     Component Value Date/Time   LABOPIA NONE DETECTED 03/11/2017 1246   COCAINSCRNUR NONE DETECTED 03/11/2017 1246   COCAINSCRNUR NEG 10/30/2015 1334   LABBENZ NONE DETECTED 03/11/2017 1246   LABBENZ NEG 10/30/2015 1334   AMPHETMU NONE DETECTED 03/11/2017 1246   THCU NONE DETECTED 03/11/2017 1246   LABBARB NONE DETECTED 03/11/2017 1246     RADIOLOGY STUDIES: No results found.    IMPRESSION:   *    Mid abdominal pain  *    Reported CGE.  Patient says she has not been vomiting all that much.  No dysphagia.  PPI drip in place.   History  severe, chronic GERD with ulcerative esophagitis and associated stenosis.  Dysphagia relieved after undergoing esophageal dilatation in March 2021.  *   Electrolyte disturbances with hypokalemia, hyponatremia, hypochloremia.  Normal BUN/creatinine.  *   Altered mental status.  Alcohol level not elevated.  ? Due to hyponatremia  *    Elevated Ngo blood cell count, no fever.  U/A is negtive.      PLAN:     *   Check CXR to r/o PNA.    *   Clear liquids  *   Image abdomen w xrays vs CTAP? Not that impressed by her exam.  Will d/w Dr Carlean Purl.    *   Do not see indication for PPI drip.  Will switch to 40 IV bid.  already received the 40 mg bolus and infusion is running, let infusion run til finished.  .    *   Correct electrolytes.     *   Covid 19 testing ordered, needs collected.      Azucena Freed  03/16/2020, 1:59 PM Phone [phone]      [place] Attending   I have taken an interval history, reviewed the chart and examined the patient. I agree with the Advanced Practitioner's note, impression and recommendations.    Severe refractory GERD with esophageal stricture  Last seen [facility] dilated w/ Savary to 12 mm w/ plan to repeat in 4 weeks  Now here w/ PNA and severe hyponatremia and altered mental status - has been given Ativan - really cannot get hx right now  Tx Na, PNA and we will see again and determine next steps. I do not think she needs abdominal imaging  Gatha Mayer, MD, [facility] 03/16/2020 4:27 PM

## 2020-03-17 ENCOUNTER — Inpatient Hospital Stay (HOSPITAL_COMMUNITY): Payer: Medicare HMO

## 2020-03-17 DIAGNOSIS — R710 Precipitous drop in hematocrit: Secondary | ICD-10-CM

## 2020-03-17 LAB — COMPREHENSIVE METABOLIC PANEL
ALT: 14 U/L (ref 0–44)
AST: 16 U/L (ref 15–41)
Albumin: 2.7 g/dL — ABNORMAL LOW (ref 3.5–5.0)
Alkaline Phosphatase: 96 U/L (ref 38–126)
Anion gap: 12 (ref 5–15)
BUN: <5 mg/dL — ABNORMAL LOW (ref 6–20)
CO2: 25 mmol/L (ref 22–32)
Calcium: 8 mg/dL — ABNORMAL LOW (ref 8.9–10.3)
Chloride: 95 mmol/L — ABNORMAL LOW (ref 98–111)
Creatinine, Ser: 0.44 mg/dL (ref 0.44–1.00)
GFR calc Af Amer: >60 mL/min (ref >60)
GFR calc non Af Amer: >60 mL/min (ref >60)
Glucose, Bld: 100 mg/dL — ABNORMAL HIGH (ref 70–99)
Potassium: 3 mmol/L — ABNORMAL LOW (ref 3.5–5.1)
Sodium: 132 mmol/L — ABNORMAL LOW (ref 135–145)
Total Bilirubin: 0.8 mg/dL (ref 0.3–1.2)
Total Protein: 5.8 g/dL — ABNORMAL LOW (ref 6.5–8.1)

## 2020-03-17 LAB — CBC
HCT: 26.6 % — ABNORMAL LOW (ref 36.0–46.0)
Hemoglobin: 9.2 g/dL — ABNORMAL LOW (ref 12.0–15.0)
MCH: 32.1 pg (ref 26.0–34.0)
MCHC: 34.6 g/dL (ref 30.0–36.0)
MCV: 92.7 fL (ref 80.0–100.0)
Platelets: 340 10*3/uL (ref 150–400)
RBC: 2.87 MIL/uL — ABNORMAL LOW (ref 3.87–5.11)
RDW: 13 % (ref 11.5–15.5)
WBC: 7.8 10*3/uL (ref 4.0–10.5)
nRBC: 0 % (ref 0.0–0.2)

## 2020-03-17 LAB — STREP PNEUMONIAE URINARY ANTIGEN: Strep Pneumo Urinary Antigen: NEGATIVE

## 2020-03-17 LAB — MAGNESIUM: Magnesium: 1.9 mg/dL (ref 1.7–2.4)

## 2020-03-17 MED ORDER — SODIUM CHLORIDE (PF) 0.9 % IJ SOLN
INTRAMUSCULAR | Status: AC
  Filled 2020-03-17: qty 50

## 2020-03-17 MED ORDER — IOHEXOL 350 MG/ML SOLN
80.0000 mL | Freq: Once | INTRAVENOUS | Status: AC | PRN
  Administered 2020-03-17: 80 mL via INTRAVENOUS

## 2020-03-17 MED ORDER — POTASSIUM CHLORIDE CRYS ER 20 MEQ PO TBCR
40.0000 meq | EXTENDED_RELEASE_TABLET | ORAL | Status: AC
  Administered 2020-03-17 (×2): 40 meq via ORAL
  Filled 2020-03-17 (×2): qty 2

## 2020-03-17 MED ORDER — IOHEXOL 300 MG/ML  SOLN: 80.0000 mL | Freq: Once | INTRAMUSCULAR | Status: DC | PRN

## 2020-03-17 MED ORDER — LACTATED RINGERS IV SOLN: INTRAVENOUS | Status: AC

## 2020-03-17 NOTE — Progress Notes (Signed)
   Patient Name: Robin Welch Date of Encounter: 03/17/2020, 8:32 AM    Subjective  "I don't like it here - I want to go home" Confused and agitated overnight and still Some resp issues and rising MEWS overnight No reports of bleeding She is not reliable for hx now   Objective  BP 112/75 (BP Location: Right Arm)   Pulse (!) 118   Temp 97.7 F (36.5 C) (Oral)   Resp 20   Ht 5\' 2"  (1.575 m)   Wt 49.9 kg   LMP 04/23/2015 (Approximate)   SpO2 96%   BMI 20.12 kg/m  Agitated , semi-cooperative abd is not tender at all  CBC Latest Ref Rng & Units 03/17/2020 03/16/2020 10/09/2019  WBC 4.0 - 10.5 K/uL 7.8 17.1(H) 9.9  Hemoglobin 12.0 - 15.0 g/dL 9.2(L) 11.2(L) 10.6(L)  Hematocrit 36 - 46 % 26.6(L) 31.6(L) 31.2(L)  Platelets 150 - 400 K/uL 340 431(H) 737.0(H)   Lab Results  Component Value Date   CREATININE 0.44 03/17/2020   BUN <5 (L) 03/17/2020   NA 132 (L) 03/17/2020   K 3.0 (L) 03/17/2020   CL 95 (L) 03/17/2020   CO2 25 03/17/2020       Assessment and Plan  Refractory GERD w/ stricture being followed at [facility] w/ last dilation to 12 mm about 2-3 weeks ago and is due later this month Coffee ground emesis  decreasing hemoglobin do not suspect sig hemorrhage bilat airspace dz - PNA w/u ongoing as is treatment Improved hyponatremia, + hypokalemia  No need to scope at this time and maybe not this admit  Continue current care Will try full liquids Will f/u tomorrow  Gatha Mayer, MD, [facility] 03/17/2020 8:32 AM

## 2020-03-17 NOTE — Plan of Care (Signed)

## 2020-03-17 NOTE — Progress Notes (Signed)
Patient still confused and inappropriate, can tell me she is in the hospital but speech is intermittently very difficult to understand, also making multiple attempts to get OOB unassisted, gait is extremely unsteady, she is swaying as if intoxicated, also falling asleep and snoring while standing up, told me that her back hurts and she needed her albuterol, lungs sounds diminished but clear, no shortness of breath noted, occasional productive cough for thick yellow sputum, respiratory paged and came to pts room,  Patient then declined a breathing treatment, vitals still being monitored per mews protocol, bed rails up and bed alarm on, bed locked and low, will continue to monitor and attempt to reorient when appropriate.

## 2020-03-17 NOTE — Progress Notes (Signed)
PROGRESS NOTE    Robin Welch  PPJ:[id number] DOB: [birth year] DOA: 03/16/2020 PCP: Ria Bush, MD   Chief Complaint  Patient presents with  . Abdominal Pain    Brief Narrative:  Robin Welch is Robin Welch 54 y.o. female with medical history significant of bipolar 1, anxiety, esophageal strictures requiring dilation, COPD, chronic hyponatremia who presented to the ED with one day hx of multiple episodes of n/v with coffee-ground appearing emesis that started 7/16. Pt seems somewhat confused currently, but denies abd pain. Pt does report decreased PO intake and feels thirsty currently. Denies chest pains, syncope, recent seizures. Claims to be complaint with home meds  ED Course: In the ED, pt found to have Na of 121 (was 129 on 10/09/19), K of 3.0, WBC 17.1 with hgb 11.2. CXR in ED reviewed, findings of diffuse bilateral patchy widespread airspace opacities concerning for multifocal possibility atypical PNA or spetic emboli. Hospitalist consulted for consideration for admission  Assessment & Plan:   Principal Problem:   Hematemesis Active Problems:   Bipolar 1 disorder (HCC)   COPD (chronic obstructive pulmonary disease) with chronic bronchitis (HCC)   Non-intractable vomiting with nausea   Chronic hyponatremia   GAD (generalized anxiety disorder)   Hyponatremia   Hypokalemia   Sepsis due to pneumonia ([place])  1. Hematemesis  Hx GERD with stricture followed at [facility] with recent dilation 2-3 weeks ago 1. Reported coffee ground emesis day prior to admit 2. Presenting hgb of 11.2 -> 9.2 today 3. Per GI, no plan to scope at this time - plan for full liquids at this time 4. PPI BID, carafate 5. Appreciate GI assistance  2. Community Acquired Pneumonia  Sepsis:  1. Pt tachycardic with leukocytosis and CXR findings of PNA - continued tachy today, will continue to monitor 2. CXR with patchy airspace opacity concerning for multifocal vs atypical pneumonia or septic emboli 3. Follow CT chest  today 4. SLP eval given concern for aspiration  5. Will continue patient on empiric unasyn 6. Blood cx pending, urine legionella pending and strep ag negative 7. Negative covid  3. Acutely worsened hyponatremia, hx of chronic hyponatremia 1. Dry mucus membranes with hx of poor po intake 2. Likely hypovolemic hyponatremia 3. Corrected with IVF  4. COPD 1. On minimal O2 support 2. Cont PRN albuterol MDI  5. Bipolar 1 with anxiety 1. Seems stable 2. Will continue home bipolar meds  6. Dehydration 1. Likely secondary to presenting decreased PO intake  7. Hypokalemia 1. Replace and follow   DVT prophylaxis: SCD Code Status: full Family Communication: none at bedside Disposition:   Status is: Inpatient  Remains inpatient appropriate because:Inpatient level of care appropriate due to severity of illness   Dispo: The patient is from: Home              Anticipated d/c is to: Home              Anticipated d/c date is: 2 days              Patient currently is not medically stable to d/c.   Consultants:   GI  Procedures:   none  Antimicrobials:  Anti-infectives (From admission, onward)   Start     Dose/Rate Route Frequency Ordered Stop   03/16/20 1800  Ampicillin-Sulbactam (UNASYN) 3 g in sodium chloride 0.9 % 100 mL IVPB     Discontinue     3 g 200 mL/hr over 30 Minutes Intravenous Every 6 hours 03/16/20 1616  03/16/20 1615  levofloxacin (LEVAQUIN) IVPB 750 mg  Status:  Discontinued        750 mg 100 mL/hr over 90 Minutes Intravenous Every 24 hours 03/16/20 1603 03/16/20 1608     Subjective: Asking about going home C/o pain, says bed is uncomfortable confused  Objective: Vitals:   03/17/20 0505 03/17/20 0849 03/17/20 1021 03/17/20 1311  BP: 112/75 (!) 112/93 127/78 126/76  Welch: (!) 118 (!) 116 (!) 106 97  Resp: 20 18 20 20   Temp: 97.7 F (36.5 C) 97.9 F (36.6 C) 98.3 F (36.8 C) 97.6 F (36.4 C)  TempSrc: Oral     SpO2: 96% 97% 98% 99%  Weight:       Height:        Intake/Output Summary (Last 24 hours) at 03/17/2020 1450 Last data filed at 03/17/2020 1141 Gross per 24 hour  Intake 3376.51 ml  Output 3050 ml  Net 326.51 ml   Filed Weights   03/16/20 1906  Weight: 49.9 kg    Examination:  General exam: Appears calm and comfortable  Respiratory system: Clear to auscultation. Respiratory effort normal. Cardiovascular system: S1 & S2 heard, RRR.  Gastrointestinal system: Abdomen is nondistended, soft and nontender. Central nervous system: Alert, seems distracted. No focal neurological deficits. Extremities: no LEE Skin: No rashes, lesions or ulcers Psychiatry: Judgement and insight appear normal. Mood & affect appropriate.     Data Reviewed: I have personally reviewed following labs and imaging studies  CBC: Recent Labs  Lab 03/16/20 0915 03/17/20 0414  WBC 17.1* 7.8  HGB 11.2* 9.2*  HCT 31.6* 26.6*  MCV 90.0 92.7  PLT 431* 546    Basic Metabolic Panel: Recent Labs  Lab 03/16/20 0915 03/17/20 0414  NA 121* 132*  K 3.0* 3.0*  CL 73* 95*  CO2 33* 25  GLUCOSE 110* 100*  BUN 12 <5*  CREATININE 0.53 0.44  CALCIUM 9.0 8.0*  MG  --  1.9    GFR: Estimated Creatinine Clearance: 61.9 mL/min (by C-G formula based on SCr of 0.44 mg/dL).  Liver Function Tests: Recent Labs  Lab 03/16/20 0915 03/17/20 0414  AST 26 16  ALT 17 14  ALKPHOS 125 96  BILITOT 0.9 0.8  PROT 7.3 5.8*  ALBUMIN 3.7 2.7*    CBG: No results for input(s): GLUCAP in the last 168 hours.   Recent Results (from the past 240 hour(s))  SARS Coronavirus 2 by RT PCR (hospital order, performed in [facility] hospital lab) Nasopharyngeal Nasopharyngeal Swab     Status: None   Collection Time: 03/16/20  2:03 PM   Specimen: Nasopharyngeal Swab  Result Value Ref Range Status   SARS Coronavirus 2 NEGATIVE NEGATIVE Final    Comment: (NOTE) SARS-CoV-2 target nucleic acids are NOT DETECTED.  The SARS-CoV-2 RNA is generally detectable in  upper and lower respiratory specimens during the acute phase of infection. The lowest concentration of SARS-CoV-2 viral copies this assay can detect is 250 copies / mL. Robin Welch negative result does not preclude SARS-CoV-2 infection and should not be used as the sole basis for treatment or other patient management decisions.  Fina Heizer negative result may occur with improper specimen collection / handling, submission of specimen other than nasopharyngeal swab, presence of viral mutation(s) within the areas targeted by this assay, and inadequate number of viral copies (<250 copies / mL). Daya Dutt negative result must be combined with clinical observations, patient history, and epidemiological information.  Fact Sheet for Patients:   StrictlyIdeas.no  Fact Sheet  for Healthcare Providers: BankingDealers.co.za  This test is not yet approved or  cleared by the [place] and has been authorized for detection and/or diagnosis of SARS-CoV-2 by FDA under an Emergency Use Authorization (EUA).  This EUA will remain in effect (meaning this test can be used) for the duration of the COVID-19 declaration under Section 564(b)(1) of the Act, 21 U.S.C. section 360bbb-3(b)(1), unless the authorization is terminated or revoked sooner.  Performed at [facility], [place] [address], [place], [place] [address]   Culture, blood (routine x 2) Call MD if unable to obtain prior to antibiotics being given     Status: None (Preliminary result)   Collection Time: 03/16/20  4:58 PM   Specimen: BLOOD  Result Value Ref Range Status   Specimen Description   Final    BLOOD LEFT ARM Performed at [place] [address] [place], [place] [address]    Special Requests   Final    BOTTLES DRAWN AEROBIC AND ANAEROBIC Blood Culture results may not be optimal due to an inadequate volume of blood received in culture bottles Performed at [facility] [address], [place], [place] [address]    Culture   Final    NO GROWTH < 24 HOURS Performed at [place] [facility] [address], [place], [place] [address]    Report Status PENDING  Incomplete  Culture, blood (routine x 2) Call MD if unable to obtain prior to antibiotics being given     Status: None (Preliminary result)   Collection Time: 03/16/20  4:58 PM   Specimen: BLOOD  Result Value Ref Range Status   Specimen Description   Final    BLOOD LEFT ARM Performed at [facility] [address] [place], [place] [address]    Special Requests   Final    BOTTLES DRAWN AEROBIC AND ANAEROBIC Blood Culture adequate volume Performed at [facility] [address], [place], [place] [address]    Culture   Final    NO GROWTH < 24 HOURS Performed at [facility] [address], [place], [place] [address]    Report Status PENDING  Incomplete         Radiology Studies: DG Chest 2 View  Result Date: 03/16/2020 CLINICAL DATA:  Productive cough. EXAM: CHEST - 2 VIEW COMPARISON:  May 05, 2018 FINDINGS: Cardiomediastinal silhouette is normal. Mediastinal contours appear intact. There is no evidence of pleural effusion or pneumothorax. Diffuse bilateral patchy widespread airspace opacities. Osteolytic lesion of the right proximal humerus, at the site of previously noted right proximal humeral fracture. Soft tissues are grossly normal. IMPRESSION: Diffuse bilateral patchy widespread airspace opacities concerning for multifocal possibly atypical pneumonia or septic emboli. Osteolytic lesion of the right proximal humerus, at the site of previously noted right proximal humerus fracture, with abnormal appearance of the distal humeral cortex. Dedicated humeral radiographs may be considered, if this is an unexpected finding. Electronically Signed   By: Fidela Salisbury M.D.   On: 03/16/2020 15:55        Scheduled Meds: .  cariprazine  3 mg Oral Daily  . pantoprazole  40 mg Intravenous Q12H  . QUEtiapine  800 mg Oral QHS  . sodium chloride (PF)      . sucralfate  1 g Oral Once  . sucralfate  1 g Oral TID AC & HS   Continuous Infusions: . ampicillin-sulbactam (UNASYN) IV 3 g (03/17/20 1141)  .  lactated ringers 75 mL/hr at 03/17/20 1136     LOS: 1 day    Time spent: over 30 min    Fayrene Helper, MD [facility]   To contact the attending provider between 7A-7P or the covering provider during after hours 7P-7A, please log into the web site [email or web address] and access using universal  [place] password for that web site. If you do not have the password, please call the hospital operator.  03/17/2020, 2:50 PM

## 2020-03-18 ENCOUNTER — Telehealth: Payer: Self-pay

## 2020-03-18 DIAGNOSIS — K2101 Gastro-esophageal reflux disease with esophagitis, with bleeding: Secondary | ICD-10-CM

## 2020-03-18 LAB — CBC WITH DIFFERENTIAL/PLATELET
Abs Immature Granulocytes: 0.04 10*3/uL (ref 0.00–0.07)
Basophils Absolute: 0 10*3/uL (ref 0.0–0.1)
Basophils Relative: 0 %
Eosinophils Absolute: 0.1 10*3/uL (ref 0.0–0.5)
Eosinophils Relative: 1 %
HCT: 28.5 % — ABNORMAL LOW (ref 36.0–46.0)
Hemoglobin: 9.8 g/dL — ABNORMAL LOW (ref 12.0–15.0)
Immature Granulocytes: 1 %
Lymphocytes Relative: 14 %
Lymphs Abs: 1.2 10*3/uL (ref 0.7–4.0)
MCH: 32.3 pg (ref 26.0–34.0)
MCHC: 34.4 g/dL (ref 30.0–36.0)
MCV: 94.1 fL (ref 80.0–100.0)
Monocytes Absolute: 0.8 10*3/uL (ref 0.1–1.0)
Monocytes Relative: 9 %
Neutro Abs: 6.4 10*3/uL (ref 1.7–7.7)
Neutrophils Relative %: 75 %
Platelets: 417 10*3/uL — ABNORMAL HIGH (ref 150–400)
RBC: 3.03 MIL/uL — ABNORMAL LOW (ref 3.87–5.11)
RDW: 12.8 % (ref 11.5–15.5)
WBC: 8.5 10*3/uL (ref 4.0–10.5)
nRBC: 0 % (ref 0.0–0.2)

## 2020-03-18 LAB — PHOSPHORUS: Phosphorus: 1.6 mg/dL — ABNORMAL LOW (ref 2.5–4.6)

## 2020-03-18 LAB — COMPREHENSIVE METABOLIC PANEL
ALT: 15 U/L (ref 0–44)
AST: 19 U/L (ref 15–41)
Albumin: 2.9 g/dL — ABNORMAL LOW (ref 3.5–5.0)
Alkaline Phosphatase: 136 U/L — ABNORMAL HIGH (ref 38–126)
Anion gap: 11 (ref 5–15)
BUN: <5 mg/dL — ABNORMAL LOW (ref 6–20)
CO2: 25 mmol/L (ref 22–32)
Calcium: 8.4 mg/dL — ABNORMAL LOW (ref 8.9–10.3)
Chloride: 97 mmol/L — ABNORMAL LOW (ref 98–111)
Creatinine, Ser: 0.41 mg/dL — ABNORMAL LOW (ref 0.44–1.00)
GFR calc Af Amer: >60 mL/min (ref >60)
GFR calc non Af Amer: >60 mL/min (ref >60)
Glucose, Bld: 134 mg/dL — ABNORMAL HIGH (ref 70–99)
Potassium: 3.1 mmol/L — ABNORMAL LOW (ref 3.5–5.1)
Sodium: 133 mmol/L — ABNORMAL LOW (ref 135–145)
Total Bilirubin: 0.7 mg/dL (ref 0.3–1.2)
Total Protein: 6.1 g/dL — ABNORMAL LOW (ref 6.5–8.1)

## 2020-03-18 LAB — MAGNESIUM: Magnesium: 1.8 mg/dL (ref 1.7–2.4)

## 2020-03-18 MED ORDER — AMOXICILLIN-POT CLAVULANATE 875-125 MG PO TABS
1.0000 | ORAL_TABLET | Freq: Two times a day (BID) | ORAL | 0 refills | Status: AC
Start: 2020-03-18 — End: 2020-03-22

## 2020-03-18 MED ORDER — OXYCODONE HCL 5 MG PO TABS: 5.0000 mg | ORAL_TABLET | Freq: Four times a day (QID) | ORAL | 0 refills | Status: AC | PRN

## 2020-03-18 MED ORDER — POTASSIUM CHLORIDE CRYS ER 20 MEQ PO TBCR
40.0000 meq | EXTENDED_RELEASE_TABLET | Freq: Once | ORAL | Status: AC
  Administered 2020-03-18: 40 meq via ORAL
  Filled 2020-03-18: qty 2

## 2020-03-18 MED ORDER — POTASSIUM & SODIUM PHOSPHATES 280-160-250 MG PO PACK
2.0000 | PACK | Freq: Three times a day (TID) | ORAL | Status: DC
  Administered 2020-03-18: 2 via ORAL
  Filled 2020-03-18 (×2): qty 2

## 2020-03-18 NOTE — Progress Notes (Signed)
Progress Note   Subjective  Patient doing better from bleeding perspective. "I want to go home". Tolerating liquids, no further vomiting or hematemesis. Hgb stable. No melena. She had a CT chest yesterday showing changes concerning for pneumonia. Also noted is humerous fracture, T spine fractures, and rib fractures - unclear how long she has had these. She does endorse pain along the R costal margin / rib cage. On Abx, WBC improved.   Objective   Vital signs in last 24 hours: Temp:  [97.6 F (36.4 C)-98.9 F (37.2 C)] 98 F (36.7 C) (07/19 0508) Pulse Rate:  [89-106] 90 (07/19 0508) Resp:  [14-20] 16 (07/19 0508) BP: (111-128)/(68-81) 111/81 (07/19 0508) SpO2:  [97 %-99 %] 99 % (07/19 0508) Last BM Date: 03/18/20 General:    Schweikert female in NAD Heart:  Regular rate and rhythm Lungs: Respirations even and unlabored Abdomen:  Soft, nontender and nondistended.  Extremities:  Without edema. Neurologic:  Alert and oriented,  grossly normal neurologically. Psych:  Cooperative. Normal mood and affect.  Intake/Output from previous day: 07/18 0701 - 07/19 0700 In: 3480.6 [P.O.:1320; I.V.:1560.6; IV Piggyback:600] Out: 3400 [Urine:3400] Intake/Output this shift: No intake/output data recorded.  Lab Results: Recent Labs    03/16/20 0915 03/17/20 0414 03/18/20 0434  WBC 17.1* 7.8 8.5  HGB 11.2* 9.2* 9.8*  HCT 31.6* 26.6* 28.5*  PLT 431* 340 417*   BMET Recent Labs    03/16/20 0915 03/17/20 0414 03/18/20 0434  NA 121* 132* 133*  K 3.0* 3.0* 3.1*  CL 73* 95* 97*  CO2 33* 25 25  GLUCOSE 110* 100* 134*  BUN 12 <5* <5*  CREATININE 0.53 0.44 0.41*  CALCIUM 9.0 8.0* 8.4*   LFT Recent Labs    03/18/20 0434  PROT 6.1*  ALBUMIN 2.9*  AST 19  ALT 15  ALKPHOS 136*  BILITOT 0.7   PT/INR No results for input(s): LABPROT, INR in the last 72 hours.  Studies/Results: DG Chest 2 View  Result Date: 03/16/2020 CLINICAL DATA:  Productive cough. EXAM: CHEST - 2 VIEW  COMPARISON:  May 05, 2018 FINDINGS: Cardiomediastinal silhouette is normal. Mediastinal contours appear intact. There is no evidence of pleural effusion or pneumothorax. Diffuse bilateral patchy widespread airspace opacities. Osteolytic lesion of the right proximal humerus, at the site of previously noted right proximal humeral fracture. Soft tissues are grossly normal. IMPRESSION: Diffuse bilateral patchy widespread airspace opacities concerning for multifocal possibly atypical pneumonia or septic emboli. Osteolytic lesion of the right proximal humerus, at the site of previously noted right proximal humerus fracture, with abnormal appearance of the distal humeral cortex. Dedicated humeral radiographs may be considered, if this is an unexpected finding. Electronically Signed   By: Fidela Salisbury M.D.   On: 03/16/2020 15:55   CT ANGIO CHEST PE W OR WO CONTRAST  Result Date: 03/17/2020 CLINICAL DATA:  Pneumonia and respiratory failure. Productive cough. EXAM: CT ANGIOGRAPHY CHEST WITH CONTRAST TECHNIQUE: Multidetector CT imaging of the chest was performed using the standard protocol during bolus administration of intravenous contrast. Multiplanar CT image reconstructions and MIPs were obtained to evaluate the vascular anatomy. CONTRAST:  80 mL OMNIPAQUE IOHEXOL 350 MG/ML SOLN COMPARISON:  PA and lateral chest earlier today and 05/05/2014. FINDINGS: Cardiovascular: Satisfactory opacification of the pulmonary arteries to the segmental level. No evidence of pulmonary embolism. Normal heart size. No pericardial effusion. Mediastinum/Nodes: No lymphadenopathy. The walls of the distal esophagus are markedly thickened. The patient has a small hiatal hernia. Thyroid gland  is unremarkable. Lungs/Pleura: The lungs demonstrate severe emphysema. There is patchy airspace disease in all lobes of both lungs most consistent with multifocal pneumonia. Small bilateral pleural effusions are seen. Upper Abdomen: Negative.  Musculoskeletal: The patient has compression fractures of T8 and T11 which are new since the 2019 PA and lateral chest but cannot be definitively characterized. Vertebral body height loss is worse at T8 where it is up to 90%. There is bony retropulsion off the superior endplate of T8 causing mild to moderate central canal stenosis at T7-8. Proximal right humerus fracture is identified as seen on the prior examination. There is no bridging bone about the fracture and fracture margins are corticating worrisome for nonunion. The patient has remote bilateral rib fractures. Review of the MIP images confirms the above findings. IMPRESSION: Negative for pulmonary embolus Patchy bilateral airspace disease most consistent with pneumonia. Thickening of the walls of the esophagus likely due to inflammatory change. Small hiatal hernia noted. T8 and T11 fractures are age indeterminate. There is bony retropulsion off the superior endplate of T8 resulting in mild to moderate central canal stenosis at T7-8. Proximal right humerus fracture seen on prior CT scan without bridging bone corticated margins worrisome for nonunion. Remote bilateral rib fractures. Emphysema (ICD10-J43.9). Electronically Signed   By: Inge Rise M.D.   On: 03/17/2020 15:14       Assessment / Plan:   54 y/o female with history of refractory GERD and refractory peptic strictures, followed at [facility] for serial dilation recently, admitted with hematemesis and pneumonia. Last EGD on 6/23 showing LA grade D esophagitis, 5cm HH, distal esophageal stricture dilated to 7mm.  Hgb dropped to 9.2 but at 9.8 today, BUN remains normal, she's not had any further episodes of vomiting or hematemesis, hemodynamically stable. No dysphagia. She has had a relatively recent endoscopy, bleeding almost certainly from her esophagitis which has been difficult to get healed despite high dose PPI. CT yesterday shows changes concerning for pneumonia, also with rib / t spine /  humerous fracture, unclear how old those findings are.  From GI perspective, bleeding has stopped, she is tolerating clear liquids. Can advance diet to full liquids and when ready for discharge from pneumonia / respiratory perspective, I think okay to go home on high dose nexium 40mg  BID and liquid carafate. She has follow up scheduled with [facility], sounds like she is being referred for surgical evaluation for repair of 5cm HH and fundoplication as she is failing medical therapy. She should continue with this evaluation at [facility]. Management of fractures / PNA per primary service, she is undergoing speech path evaluation to assess for aspiration.  We will sign off at this time, please call with questions in the interim.  Ripley Cellar, MD [facility]

## 2020-03-18 NOTE — Telephone Encounter (Signed)
Transition Care Management Follow-up Telephone Call  Date of discharge and from where: 03/18/2020, [facility]  How have you been since you were released from the hospital? Patient states that she is still in a lot of pain and is just trying to get home and rest.  Any questions or concerns? No   Items Reviewed:  Did the pt receive and understand the discharge instructions provided? Yes   Medications obtained and verified? Yes   Any new allergies since your discharge? No   Dietary orders reviewed? Yes  Do you have support at home? Yes   Functional Questionnaire: (I = Independent and D = Dependent) ADLs: I  Bathing/Dressing- I  Meal Prep- I  Eating- I  Maintaining continence- I  Transferring/Ambulation- I  Managing Meds- I  Follow up appointments reviewed:   PCP Hospital f/u appt confirmed? No  Patient said she goes on vacation next week and will call back to schedule an appointment at a later date.  [facility] f/u appt confirmed? N/A   Are transportation arrangements needed? No   If their condition worsens, is the pt aware to call PCP or go to the Emergency Dept.? Yes  Was the patient provided with contact information for the PCP's office or ED? Yes  Was to pt encouraged to call back with questions or concerns? Yes

## 2020-03-18 NOTE — Evaluation (Signed)
Clinical/Bedside Swallow Evaluation Patient Details  Name: Robin Welch MRN: [id number] Date of Birth: 06/23/1966  Today's Date: 03/18/2020 Time: SLP Start Time (ACUTE ONLY): 0900 SLP Stop Time (ACUTE ONLY): 0920 SLP Time Calculation (min) (ACUTE ONLY): 20 min  Past Medical History:  Past Medical History:  Diagnosis Date  . Anemia   . Anxiety   . Barrett esophagus   . Bipolar 1 disorder [facility])    sees Dr. Candis Schatz psych [phone])  . Closed fracture distal radius and ulna, left, initial encounter 04/20/2019   S/p ORIF 04/2019 [facility])   . COPD (chronic obstructive pulmonary disease) ([place]) 03/2013   hyperinflation by CXR  . Elevated alkaline phosphatase level   . Esophagitis   . Hiatal hernia   . History of cocaine dependence ([place]) latest 2020   undergoing detox  . History of stomach ulcers   . Hx: UTI (urinary tract infection)   . Migraine    "stopped in ~ 2008" (03/11/2017)  . Muscle strain of gluteal region, right, subsequent encounter 10/05/2018   By MRI 2020  . Osteoporosis 06/09/2018   DEXA 05/2017 - T score -2.5 L hip, -1.3 spine  . Positive PPD 1989   s/p CXR and INH 6 mo  . Seizure ([place]) X 1   "from takiing too many headache medicine?" (03/11/2017)  . Smoker    Past Surgical History:  Past Surgical History:  Procedure Laterality Date  . BIOPSY  07/19/2019   Procedure: BIOPSY;  Surgeon: Gatha Mayer, MD;  Location: [facility];  Service: Endoscopy;;  . COLONOSCOPY  07/2018   poor colon prep - rec repeat ([place])  . DILATION AND CURETTAGE OF UTERUS  X 1   S/P miscarriage  . ESOPHAGOGASTRODUODENOSCOPY  03/2017   candidal esophagitis, treated, LA grade D esophagitis, 4cm HH ([place])  . ESOPHAGOGASTRODUODENOSCOPY  07/2018   severe reflux esophagitis, neg candida ([place])  . ESOPHAGOGASTRODUODENOSCOPY (EGD) WITH PROPOFOL N/A 07/19/2019   Procedure: ESOPHAGOGASTRODUODENOSCOPY (EGD) WITH PROPOFOL;  Surgeon: Gatha Mayer, MD;  Location: [facility];   Service: Endoscopy;  Laterality: N/A;  . ESOPHAGOGASTRODUODENOSCOPY (EGD) WITH PROPOFOL N/A 10/24/2019   Procedure: ESOPHAGOGASTRODUODENOSCOPY (EGD) WITH PROPOFOL;  Surgeon: Mauri Pole, MD;  Location: [facility];  Service: Endoscopy;  Laterality: N/A;  . ESOPHAGOGASTRODUODENOSCOPY (EGD) WITH PROPOFOL N/A 11/21/2019   Procedure: ESOPHAGOGASTRODUODENOSCOPY (EGD) WITH PROPOFOL;  Surgeon: Mauri Pole, MD;  Location: [facility];  Service: Endoscopy;  Laterality: N/A;  . LAPAROSCOPIC CHOLECYSTECTOMY  2006  . ORIF WRIST FRACTURE Right 04/08/2019   Procedure: OPEN REDUCTION INTERNAL FIXATION (ORIF) DISTAL RADIUS/ULNA FRACTURE;  Surgeon: Verner Mould, MD;  Location: [facility];  Service: Orthopedics;  Laterality: Right;  . TUBAL LIGATION  2003   HPI:  54yo female admitted 03/16/20 with N/V with coffee ground emesis, ? confusion. PMH: BiPolar1, anxiety, esophageal strictures s/p dilation, Barrett's esophagus, hiatal hernia, cocaine dependance, COPD, hyponatremia. CXR = diffuse bilateral patchy widespread airspace opacities concerning for multifocal or atypical PNA or septic emboli.   Assessment / Plan / Recommendation Clinical Impression  Pt seen at bedside for clinical swallow evaluation. PO trials were limited to clear liquids due to GI issues. Pt reports no difficulty swallowing, but has a history of significant esophageal problems including strictures, Barrett's esophagus, and hiatal hernia. In addition, pt has COPD, which increases the possibility of discoordination of breathing and swallowing as well as silent aspiration.   Pt presents with adequate dentition, and CN exam is unremarkable. Cough is weak, possibly due to rib  fractures. Pt reports she doesn't "have the pressure to push it out". Pt was observed with thin liquids including water, tea, jello, and italian ice. No cough response noted following these presentations. RN provided meds with liquid, and pt did exhibit cough  response after the swallow with liquid/meds. This may be due to increased difficulty with managing mixed consistency of water and pills. Recommend providing meds in applesauce if ok with GI. SLP provided written behavioral and dietary strategies for management of esophageal dysmotility. This information was reviewed and left with pt.   SLP will follow acutely for education, and to determine if further work up is needed. Pt may benefit from instrumental study (MBS) if lungs do not clear in a timely manner, or if PNA recurs. Pt is anxious for DC soon - completion of this assessment could be scheduled on an outpatient basis.  SLP Visit Diagnosis: Dysphagia, unspecified (R13.10)    Aspiration Risk  Mild aspiration risk    Diet Recommendation Other (Comment) (clear liquids until ok with GI)   Medication Administration: Whole meds with puree Supervision: Patient able to self feed;Intermittent supervision to cue for compensatory strategies Compensations: Minimize environmental distractions;Slow rate;Small sips/bites;Follow solids with liquid Postural Changes: Seated upright at 90 degrees;Remain upright for at least 30 minutes after po intake    Other  Recommendations Oral Care Recommendations: Oral care BID   Follow up Recommendations Other (comment) (TBD)      Frequency and Duration min 1 x/week  1 week;2 weeks       Prognosis Prognosis for Safe Diet Advancement: Good Barriers to Reach Goals: Cognitive deficits;Other (Comment) (esophageal issues)      Swallow Study   General Date of Onset: 03/16/20 HPI: 54yo female admitted 03/16/20 with N/V with coffee ground emesis, ? confusion. PMH: BiPolar1, anxiety, esophageal strictures s/p dilation, Barrett's esophagus, hiatal hernia, cocaine dependance, COPD, hyponatremia. CXR = diffuse bilateral patchy widespread airspace opacities concerning for multifocal or atypical PNA or septic emboli. Type of Study: Bedside Swallow Evaluation Previous  Swallow Assessment: none Diet Prior to this Study: Other (Comment) (clear liquids) Temperature Spikes Noted: No Respiratory Status: Room air History of Recent Intubation: No Behavior/Cognition: Alert;Cooperative Oral Cavity Assessment: Dry Oral Care Completed by SLP: No Oral Cavity - Dentition: Adequate natural dentition Vision: Functional for self-feeding Self-Feeding Abilities: Able to feed self Patient Positioning: Upright in bed Baseline Vocal Quality: Normal Volitional Cough: Weak (pt reports she doesn't "have a lot of pressure to push it out") Volitional Swallow: Able to elicit    Oral/Motor/Sensory Function Overall Oral Motor/Sensory Function: Within functional limits   Ice Chips Ice chips: Not tested   Thin Liquid Thin Liquid: Within functional limits Presentation: Straw    Nectar Thick Nectar Thick Liquid: Not tested   Honey Thick Honey Thick Liquid: Not tested   Puree Puree: Not tested   Solid     Solid: Not tested     Jodi Criscuolo B. Quentin Ore, [facility], [place] Speech Language Pathologist Office: [phone] Pager: [phone]  Shonna Chock 03/18/2020,9:45 AM

## 2020-03-18 NOTE — Progress Notes (Signed)
PATIENT IS BEING DC HOME IN STABLE CONDITION. DISCHARGE INSTRUCTIONS AND MEDICATION DOCUMENTATION GIVEN AND INSTRUCTED. PATIENT IS EAGER TO GO HOME.

## 2020-03-18 NOTE — TOC Transition Note (Addendum)
Transition of Care [facility]) - CM/SW Discharge Note   Patient Details  Name: Porshe Fleagle MRN: [id number] Date of Birth: 03-Jun-1966  [facility]) CM/SW Contact:  Lia Hopping, [place] Phone Number: 03/18/2020, 1:29 PM   Clinical Narrative:    Patient reports she does not have family or friends that can transport her home today. CSW confirm with physician and nursing staff the patient can ambulate w/o any issues. Patient agreeable to use [facility] transportation. CSW faxed rider waiver and liability.  CSW confirm address to [address] [place], [address].   Final next level of care: Home/Self Care Barriers to Discharge: Barriers Resolved   Patient Goals and CMS Choice     Choice offered to / list presented to : NA  Discharge Placement                       Discharge Plan and Services                  DME Agency: NA         HH Agency: NA        Social Determinants of Health ([place]) Interventions     Readmission Risk Interventions Readmission Risk Prevention Plan 07/09/2019  Transportation Screening Complete  PCP or Specialist Appt within 3-5 Days Complete  HRI or [place] Complete  Social Work Consult for [place] Planning/Counseling Not Complete  Palliative Care Screening Not Applicable  Medication Review Press photographer) Referral to Pharmacy  Some recent data might be hidden

## 2020-03-18 NOTE — Plan of Care (Signed)

## 2020-03-18 NOTE — Discharge Summary (Signed)
Physician Discharge Summary  Robin Welch MCN:[id number] DOB: [birth year] DOA: 03/16/2020  PCP: Ria Bush, MD  Admit date: 03/16/2020 Discharge date: 03/18/2020  Time spent: 40 minutes  Recommendations for Outpatient Follow-up:  1. Follow outpatient CBC/CMP 2. Follow outpatient MBS per speech therapy 3. Follow with [facility] GI as scheduled  4. Continue antibiotics for pneumonia 5. Pt with fractures of T spine, ribs - needs outpatient follow up for osteoporosis 6. Proximal R humerus fracture, concern for nonunion, follow outpatient  7. Follow outpatient CXR outpatient 8. Follow final cultures, urine legionella  Discharge Diagnoses:  Principal Problem:   Hematemesis Active Problems:   Bipolar 1 disorder (HCC)   COPD (chronic obstructive pulmonary disease) with chronic bronchitis (HCC)   Non-intractable vomiting with nausea   Chronic hyponatremia   GAD (generalized anxiety disorder)   Hyponatremia   Hypokalemia   Sepsis due to pneumonia [facility])   Discharge Condition: stable  Diet recommendation: full liquid  Filed Weights   03/16/20 1906  Weight: 49.9 kg    History of present illness:  Robin Welch Robin Welch 54 y.o.femalewith medical history significant ofbipolar 1, anxiety, esophageal strictures requiring dilation, COPD, chronic hyponatremia who presented to the ED with one day hx of multiple episodes of n/v with coffee-ground appearing emesis that started 7/16. Pt seems somewhat confused currently, but denies abd pain. Pt does report decreased PO intake and feels thirsty currently. Denies chest pains, syncope, recent seizures. Claims to be complaint with home meds  ED Course:In the ED, pt found to have Na of 121 (was 129 on 10/09/19), K of 3.0, WBC 17.1 with hgb 11.2. CXR in ED reviewed, findings of diffuse bilateral patchy widespread airspace opacities concerning for multifocal possibility atypical PNA or spetic emboli. Hospitalist consulted for consideration for admission  She  was admitted after having multiple episodes of nausea and vomiting with coffee ground emesis.  GI was consulted.  She had CXR findings concerning for pneumonia.  She was continued on PPI therapy and GI recommended outpatient follow up with [facility].  She had CT chest notable for thoracic spine fractures and remote rib fractures, will need to be followed outpatient.  Speech therapy recommended an outpatient mbs.   See below for additional details  Hospital Course:  1. Hematemesis  Hx GERD with stricture followed at [facility] with recent dilation 2-3 weeks ago 1. Reported coffee ground emesis day prior to admit 2. Presenting hgb of 11.2 -> 9.2 -> improved to 9.8 on day of discharge 3. Per GI, no plan to scope at this time - plan for full liquids at this time - recommend discharge on high dose nexium 40 BID and carafate - follow up with [facility] 4. PPI BID, carafate 5. Appreciate GI assistance  2. Community Acquired Pneumonia  Sepsis:  6. Pt tachycardic with leukocytosis and CXR findings of PNA - continued tachy today, will continue to monitor 7. CXR with patchy airspace opacity concerning for multifocal vs atypical pneumonia or septic emboli 8. CT chest with patchy bilateral airspace disease most c/w pneumonia 9. SLP eval given concern for aspiration  10. Will continue patient on empiric unasyn - discharge with augmentin  11. Blood cx pending, urine legionella pending and strep ag negative 12. Negative covid  3. Acutely worsened hyponatremia, hx of chronic hyponatremia 1. Dry mucus membranes with hx of poor po intake 2. Likely hypovolemic hyponatremia 3. Corrected with IVF  4. COPD 1. On minimal O2 support 2. Cont PRN albuterol MDI  5. Bipolar 1 with anxiety 1.  Seems stable 2. Will continue home bipolar meds  6. Dehydration 1. Likely secondary to presenting decreased PO intake  7. T8 and T11 age indeterminate fractures  Bilateral Rib fractures  Nonunion of R proximal humerus fx 1. Pain  control, follow outpatient with PCP for osteoporosis 2. Follow outpatient for nonunion  8. Hypokalemia 1. Replace and follow   Procedures: none  Consultations:  GI  Discharge Exam: Vitals:   03/17/20 2149 03/18/20 0508  BP: 128/68 111/81  Pulse: 89 90  Resp: 20 16  Temp: 98.9 F (37.2 C) 98 F (36.7 C)  SpO2: 97% 99%   Eager to go home Has vacation Wednesday No new complaints, has back pain which she presented with No numbness, weakness  General: No acute distress. Cardiovascular: Heart sounds show Robin Welch regular rate, and rhythm. Lungs: Clear to auscultation bilaterally Abdomen: Soft, nontender, nondistended  Neurological: Alert and oriented 3. Moves all extremities 4 . Cranial nerves II through XII grossly intact. Skin: Warm and dry. No rashes or lesions. Extremities: No clubbing or cyanosis. No edema.   Discharge Instructions   Discharge Instructions    Call MD for:  difficulty breathing, headache or visual disturbances   Complete by: As directed    Call MD for:  extreme fatigue   Complete by: As directed    Call MD for:  hives   Complete by: As directed    Call MD for:  persistant dizziness or light-headedness   Complete by: As directed    Call MD for:  persistant nausea and vomiting   Complete by: As directed    Call MD for:  redness, tenderness, or signs of infection (pain, swelling, redness, odor or green/yellow discharge around incision site)   Complete by: As directed    Call MD for:  severe uncontrolled pain   Complete by: As directed    Call MD for:  temperature >100.4   Complete by: As directed    Diet full liquid   Complete by: As directed    Discharge instructions   Complete by: As directed    You were seen for pneumonia and bloody emesis (vomiting).  You've improved after evaluation by gastroenterology and with IV antibiotics and acid blockers.  We'll send you home with plans to follow up with [facility] as an outpatient as planned.  Continue Robin Welch  full liquid diet for now and follow up with your PCP or GI doctor outpatient.  If you're doing well with the full liquid diet, you can advance to Robin Welch dysphagia 2 diet (fine chop).  Take meds with apple sauce/puree.  You should follow up with speech therapy outpatient for Robin Welch modified barium swallow to evaluate your risk of aspiration.  Your CT scan showed pneumonia.  We'll discharge you with another 4 days of antibiotics.  You'll need to follow up repeat imaging with your PCP.    You had spine fractures and rib fractures which should be followed outpatient to discuss further management and osteoporosis management.  You have Robin Welch right humerus fracture which is concerning for nonunion (poor healing).  Please follow up with orthopedics outpatient to discuss this.  We'll send you with Robin Welch couple of days of oxycodone.  Don't mix this with your belsomra.  You can also use topical voltaren gel as needed.  Do not use oral NSAIDS (don't use ibuprofen, naproxen, aleve, etc because of the risk of bleeding).  Return for new, recurrent, or worsening symptoms.  Please ask your PCP to request records from this  hospitalization so they know what was done and what the next steps will be.   Increase activity slowly   Complete by: As directed      Allergies as of 03/18/2020      Reactions   Prozac [fluoxetine Hcl] Swelling, Rash   Doxycycline Nausea And Vomiting   Celexa [citalopram Hydrobromide] Other (See Comments)   Possible allergic reaction   Chantix [varenicline Tartrate] Swelling   Mouth swelling   Hydroxyzine Other (See Comments)   Not effective   Klonopin [clonazepam] Other (See Comments)   Caused instablility. Not able to drive due to over medicated   Lunesta [eszopiclone] Other (See Comments)   Not effective   Sonata [zaleplon] Other (See Comments)   Not effective   Trazodone And Nefazodone Other (See Comments)   ineffective   Zolpimist [zolpidem Tartrate] Other (See Comments)   Not effective    Azithromycin Other (See Comments)   Reaction not recalled by the patient      Medication List    TAKE these medications   albuterol 108 (90 Base) MCG/ACT inhaler Commonly known as: VENTOLIN HFA INHALE 2 PUFFS BY MOUTH EVERY 6 HOURS AS NEEDED FOR WHEEZING OR SHORTNESS OF BREATH What changed: See the new instructions.   amoxicillin-clavulanate 875-125 MG tablet Commonly known as: Augmentin Take 1 tablet by mouth 2 (two) times daily for 4 days.   Belsomra 15 MG Tabs Generic drug: Suvorexant Take 15 mg by mouth at bedtime.   calcium carbonate 600 MG Tabs tablet Commonly known as: Calcium 600 Take 1 tablet (600 mg total) by mouth daily with breakfast.   cariprazine capsule Commonly known as: VRAYLAR Take 3 mg by mouth daily.   cholestyramine light 4 g packet Commonly known as: Prevalite Take 0.5 packets (2 g total) by mouth 2 (two) times daily.   esomeprazole 40 MG packet Commonly known as: NexIUM Take 40 mg by mouth 2 (two) times daily at 8 am and 10 pm.   multivitamin with minerals Tabs tablet Take 1 tablet by mouth daily.   oxyCODONE 5 MG immediate release tablet Commonly known as: Roxicodone Take 1 tablet (5 mg total) by mouth every 6 (six) hours as needed for up to 3 days for severe pain.   QUEtiapine 200 MG tablet Commonly known as: SEROQUEL TAKE 4 TABLETS AT BEDTIME   Spiriva Respimat 2.5 MCG/ACT Aers Generic drug: Tiotropium Bromide Monohydrate Inhale 1 puff into the lungs daily.   sucralfate 1 g tablet Commonly known as: Carafate Take 1 tablet (1 g total) by mouth 4 (four) times daily.   Vitamin D 50 MCG (2000 UT) Caps Take 1 capsule (2,000 Units total) by mouth daily.      Allergies  Allergen Reactions  . Prozac [Fluoxetine Hcl] Swelling and Rash  . Doxycycline Nausea And Vomiting  . Celexa [Citalopram Hydrobromide] Other (See Comments)    Possible allergic reaction  . Chantix [Varenicline Tartrate] Swelling    Mouth swelling  . Hydroxyzine  Other (See Comments)    Not effective  . Klonopin [Clonazepam] Other (See Comments)    Caused instablility. Not able to drive due to over medicated  . Lunesta [Eszopiclone] Other (See Comments)    Not effective  . Sonata [Zaleplon] Other (See Comments)    Not effective  . Trazodone And Nefazodone Other (See Comments)    ineffective  . Zolpimist [Zolpidem Tartrate] Other (See Comments)    Not effective  . Azithromycin Other (See Comments)    Reaction not recalled by  the patient      The results of significant diagnostics from this hospitalization (including imaging, microbiology, ancillary and laboratory) are listed below for reference.    Significant Diagnostic Studies: DG Chest 2 View  Result Date: 03/16/2020 CLINICAL DATA:  Productive cough. EXAM: CHEST - 2 VIEW COMPARISON:  May 05, 2018 FINDINGS: Cardiomediastinal silhouette is normal. Mediastinal contours appear intact. There is no evidence of pleural effusion or pneumothorax. Diffuse bilateral patchy widespread airspace opacities. Osteolytic lesion of the right proximal humerus, at the site of previously noted right proximal humeral fracture. Soft tissues are grossly normal. IMPRESSION: Diffuse bilateral patchy widespread airspace opacities concerning for multifocal possibly atypical pneumonia or septic emboli. Osteolytic lesion of the right proximal humerus, at the site of previously noted right proximal humerus fracture, with abnormal appearance of the distal humeral cortex. Dedicated humeral radiographs may be considered, if this is an unexpected finding. Electronically Signed   By: Robin Welch M.D.   On: 03/16/2020 15:55   CT ANGIO CHEST PE W OR WO CONTRAST  Result Date: 03/17/2020 CLINICAL DATA:  Pneumonia and respiratory failure. Productive cough. EXAM: CT ANGIOGRAPHY CHEST WITH CONTRAST TECHNIQUE: Multidetector CT imaging of the chest was performed using the standard protocol during bolus administration of  intravenous contrast. Multiplanar CT image reconstructions and MIPs were obtained to evaluate the vascular anatomy. CONTRAST:  80 mL OMNIPAQUE IOHEXOL 350 MG/ML SOLN COMPARISON:  PA and lateral chest earlier today and 05/05/2014. FINDINGS: Cardiovascular: Satisfactory opacification of the pulmonary arteries to the segmental level. No evidence of pulmonary embolism. Normal heart size. No pericardial effusion. Mediastinum/Nodes: No lymphadenopathy. The walls of the distal esophagus are markedly thickened. The patient has Robin Welch small hiatal hernia. Thyroid gland is unremarkable. Lungs/Pleura: The lungs demonstrate severe emphysema. There is patchy airspace disease in all lobes of both lungs most consistent with multifocal pneumonia. Small bilateral pleural effusions are seen. Upper Abdomen: Negative. Musculoskeletal: The patient has compression fractures of T8 and T11 which are new since the 2019 PA and lateral chest but cannot be definitively characterized. Vertebral body height loss is worse at T8 where it is up to 90%. There is bony retropulsion off the superior endplate of T8 causing mild to moderate central canal stenosis at T7-8. Proximal right humerus fracture is identified as seen on the prior examination. There is no bridging bone about the fracture and fracture margins are corticating worrisome for nonunion. The patient has remote bilateral rib fractures. Review of the MIP images confirms the above findings. IMPRESSION: Negative for pulmonary embolus Patchy bilateral airspace disease most consistent with pneumonia. Thickening of the walls of the esophagus likely due to inflammatory change. Small hiatal hernia noted. T8 and T11 fractures are age indeterminate. There is bony retropulsion off the superior endplate of T8 resulting in mild to moderate central canal stenosis at T7-8. Proximal right humerus fracture seen on prior CT scan without bridging bone corticated margins worrisome for nonunion. Remote bilateral  rib fractures. Emphysema (ICD10-J43.9). Electronically Signed   By: Robin Welch M.D.   On: 03/17/2020 15:14    Microbiology: Recent Results (from the past 240 hour(s))  SARS Coronavirus 2 by RT PCR (hospital order, performed in [facility] hospital lab) Nasopharyngeal Nasopharyngeal Swab     Status: None   Collection Time: 03/16/20  2:03 PM   Specimen: Nasopharyngeal Swab  Result Value Ref Range Status   SARS Coronavirus 2 NEGATIVE NEGATIVE Final    Comment: (NOTE) SARS-CoV-2 target nucleic acids are NOT DETECTED.  The SARS-CoV-2 RNA  is generally detectable in upper and lower respiratory specimens during the acute phase of infection. The lowest concentration of SARS-CoV-2 viral copies this assay can detect is 250 copies / mL. Robin Welch negative result does not preclude SARS-CoV-2 infection and should not be used as the sole basis for treatment or other patient management decisions.  Robin Welch negative result may occur with improper specimen collection / handling, submission of specimen other than nasopharyngeal swab, presence of viral mutation(s) within the areas targeted by this assay, and inadequate number of viral copies (<250 copies / mL). Robin Welch negative result must be combined with clinical observations, patient history, and epidemiological information.  Fact Sheet for Patients:   StrictlyIdeas.no  Fact Sheet for Healthcare Providers: BankingDealers.co.za  This test is not yet approved or  cleared by the [place] FDA and has been authorized for detection and/or diagnosis of SARS-CoV-2 by FDA under an Emergency Use Authorization (EUA).  This EUA will remain in effect (meaning this test can be used) for the duration of the COVID-19 declaration under Section 564(b)(1) of the Act, 21 U.S.C. section 360bbb-3(b)(1), unless the authorization is terminated or revoked sooner.  Performed at [facility], [place] [address], [place], [place] [address]   Culture, blood (routine x 2) Call MD if unable to obtain prior to antibiotics being given     Status: None (Preliminary result)   Collection Time: 03/16/20  4:58 PM   Specimen: BLOOD  Result Value Ref Range Status   Specimen Description   Final    BLOOD LEFT ARM Performed at [facility] [address] [place], [place] [address]    Special Requests   Final    BOTTLES DRAWN AEROBIC AND ANAEROBIC Blood Culture results may not be optimal due to an inadequate volume of blood received in culture bottles Performed at [place] [address], [place], [place] [address]    Culture   Final    NO GROWTH 2 DAYS Performed at [facility] [address], [place], [place] [address]    Report Status PENDING  Incomplete  Culture, blood (routine x 2) Call MD if unable to obtain prior to antibiotics being given     Status: None (Preliminary result)   Collection Time: 03/16/20  4:58 PM   Specimen: BLOOD  Result Value Ref Range Status   Specimen Description   Final    BLOOD LEFT ARM Performed at [facility] [address], [place], [place] [address]    Special Requests   Final    BOTTLES DRAWN AEROBIC AND ANAEROBIC Blood Culture adequate volume Performed at [facility] [address] [place], [place] [address]    Culture   Final    NO GROWTH 2 DAYS Performed at [facility] [address], [place], [place] [address]    Report Status PENDING  Incomplete     Labs: Basic Metabolic Panel: Recent Labs  Lab 03/16/20 0915 03/17/20 0414 03/18/20 0434  NA 121* 132* 133*  K 3.0* 3.0* 3.1*  CL 73* 95* 97*  CO2 33* 25 25  GLUCOSE 110* 100* 134*  BUN 12 <5* <5*  CREATININE 0.53 0.44 0.41*  CALCIUM 9.0 8.0* 8.4*  MG  --  1.9 1.8  PHOS  --   --  1.6*   Liver Function Tests: Recent Labs  Lab 03/16/20 0915 03/17/20 0414 03/18/20 0434  AST 26 16 19   ALT 17 14 15  ALKPHOS 125 96 136*  BILITOT 0.9 0.8 0.7  PROT 7.3 5.8* 6.1*  ALBUMIN 3.7 2.7* 2.9*   Recent Labs  Lab 03/16/20 0915  LIPASE 18   No results for input(s): AMMONIA in the last 168 hours. CBC: Recent Labs  Lab 03/16/20 0915 03/17/20 0414 03/18/20 0434  WBC 17.1* 7.8 8.5  NEUTROABS  --   --  6.4  HGB 11.2* 9.2* 9.8*  HCT 31.6* 26.6* 28.5*  MCV 90.0 92.7 94.1  PLT 431* 340 417*   Cardiac Enzymes: No results for input(s): CKTOTAL, CKMB, CKMBINDEX, TROPONINI in the last 168 hours. BNP: BNP (last 3 results) No results for input(s): BNP in the last 8760 hours.  ProBNP (last 3 results) No results for input(s): PROBNP in the last 8760 hours.  CBG: No results for input(s): GLUCAP in the last 168 hours.     Signed:  Fayrene Helper MD.  [facility] 03/18/2020, 9:03 PM

## 2020-03-18 NOTE — Plan of Care (Signed)

## 2020-03-19 ENCOUNTER — Telehealth: Payer: Self-pay | Admitting: Psychiatry

## 2020-03-19 ENCOUNTER — Other Ambulatory Visit: Payer: Self-pay

## 2020-03-19 DIAGNOSIS — F5105 Insomnia due to other mental disorder: Secondary | ICD-10-CM

## 2020-03-19 MED ORDER — BELSOMRA 15 MG PO TABS: 15.0000 mg | ORAL_TABLET | Freq: Every day | ORAL | 0 refills | Status: DC

## 2020-03-19 NOTE — Telephone Encounter (Signed)
  Rx called into [facility]

## 2020-03-19 NOTE — Telephone Encounter (Signed)
Patient called and said that she needs a refill on her belsomra to be sent to [facility] and a 10 day supply to be sent to [facility] in [place]

## 2020-03-20 ENCOUNTER — Telehealth: Payer: Self-pay

## 2020-03-20 LAB — LEGIONELLA PNEUMOPHILA SEROGP 1 UR AG: L. pneumophila Serogp 1 Ur Ag: NEGATIVE

## 2020-03-20 NOTE — Telephone Encounter (Signed)
Pt pending labs for new start Prolia.  Abnl Ca++ lab 03-18-20.

## 2020-03-21 LAB — CULTURE, BLOOD (ROUTINE X 2)
Culture: NO GROWTH
Culture: NO GROWTH
Special Requests: ADEQUATE

## 2020-03-22 NOTE — Telephone Encounter (Signed)
These are hospital labs and cannot be used for prolia labs.

## 2020-03-26 ENCOUNTER — Telehealth: Payer: Self-pay | Admitting: Psychiatry

## 2020-03-26 ENCOUNTER — Other Ambulatory Visit: Payer: Self-pay

## 2020-03-26 MED ORDER — BELSOMRA 15 MG PO TABS: 15.0000 mg | ORAL_TABLET | Freq: Every day | ORAL | 2 refills | Status: DC

## 2020-03-26 NOTE — Telephone Encounter (Signed)
Called into [facility], they were unable to fill #15 when I called that in last week for the [facility] it has to be #30. Called in with 2 refills.

## 2020-03-26 NOTE — Telephone Encounter (Signed)
Please review

## 2020-03-26 NOTE — Telephone Encounter (Signed)
Pt called to report [employer] mail order for Belsomra is over $200 and she can't afford that right now. Asking you to call in @ [facility] month to month. Only has 2 pills left. Asking to call in refill now.

## 2020-03-27 ENCOUNTER — Telehealth: Payer: Self-pay | Admitting: Family Medicine

## 2020-03-27 NOTE — Telephone Encounter (Signed)
Pt called to schedule hospital follow up. VIDEO APPOINTMENT Scheduled for 8/2.  Pt stated she needed to be seen today she is in extreme pain  She stated she needs advise on what she needs to do

## 2020-03-27 NOTE — Telephone Encounter (Addendum)
Pt said ribs on rt side and entire mid back hurt; symptoms started 2 wks ago and was admitted to hospital due to pneumonia and pt bleeding from an ulcer. Pt said the pneumonia is gone and the bleeding has stopped but pt is in a lot of pain still as described above. Pt said she has fxs of the spine,ribs and other places. Per hospital discharge notes pt was to FU with orthopedics and pt has not done that yet. Pt will call [facility] now to see if she can be seen today and if not pt will go to ED due to her hurting so badly. Now pain level is 8 and pt said the pain level will go up when tries to stand up. Pt will keep video HFU with Dr Darnell Level on 04/01/20. FYI to Dr Darnell Level.

## 2020-03-27 NOTE — Telephone Encounter (Signed)
No appts available today. Please triage patient.

## 2020-04-01 ENCOUNTER — Telehealth (INDEPENDENT_AMBULATORY_CARE_PROVIDER_SITE_OTHER): Payer: Medicare HMO | Admitting: Family Medicine

## 2020-04-01 ENCOUNTER — Encounter: Payer: Self-pay | Admitting: Family Medicine

## 2020-04-01 VITALS — BP 119/80 | HR 102 | Temp 98.0°F | Ht 62.0 in | Wt 110.0 lb

## 2020-04-01 DIAGNOSIS — M8000XG Age-related osteoporosis with current pathological fracture, unspecified site, subsequent encounter for fracture with delayed healing: Secondary | ICD-10-CM

## 2020-04-01 DIAGNOSIS — E871 Hypo-osmolality and hyponatremia: Secondary | ICD-10-CM | POA: Diagnosis not present

## 2020-04-01 DIAGNOSIS — F319 Bipolar disorder, unspecified: Secondary | ICD-10-CM | POA: Diagnosis not present

## 2020-04-01 DIAGNOSIS — S52501S Unspecified fracture of the lower end of right radius, sequela: Secondary | ICD-10-CM

## 2020-04-01 DIAGNOSIS — A419 Sepsis, unspecified organism: Secondary | ICD-10-CM

## 2020-04-01 DIAGNOSIS — R112 Nausea with vomiting, unspecified: Secondary | ICD-10-CM | POA: Diagnosis not present

## 2020-04-01 DIAGNOSIS — Z720 Tobacco use: Secondary | ICD-10-CM | POA: Diagnosis not present

## 2020-04-01 DIAGNOSIS — J189 Pneumonia, unspecified organism: Secondary | ICD-10-CM

## 2020-04-01 DIAGNOSIS — E43 Unspecified severe protein-calorie malnutrition: Secondary | ICD-10-CM | POA: Diagnosis not present

## 2020-04-01 DIAGNOSIS — M546 Pain in thoracic spine: Secondary | ICD-10-CM | POA: Diagnosis not present

## 2020-04-01 DIAGNOSIS — R000 Tachycardia, unspecified: Secondary | ICD-10-CM | POA: Diagnosis not present

## 2020-04-01 DIAGNOSIS — S22000A Wedge compression fracture of unspecified thoracic vertebra, initial encounter for closed fracture: Secondary | ICD-10-CM

## 2020-04-01 DIAGNOSIS — K2101 Gastro-esophageal reflux disease with esophagitis, with bleeding: Secondary | ICD-10-CM

## 2020-04-01 DIAGNOSIS — R918 Other nonspecific abnormal finding of lung field: Secondary | ICD-10-CM | POA: Diagnosis not present

## 2020-04-01 DIAGNOSIS — S42201A Unspecified fracture of upper end of right humerus, initial encounter for closed fracture: Secondary | ICD-10-CM

## 2020-04-01 DIAGNOSIS — S52601S Unspecified fracture of lower end of right ulna, sequela: Secondary | ICD-10-CM

## 2020-04-01 MED ORDER — SUCRALFATE 1 G PO TABS: 1.0000 g | ORAL_TABLET | Freq: Four times a day (QID) | ORAL | 0 refills | Status: DC

## 2020-04-01 MED ORDER — CALCITONIN (SALMON) 200 UNIT/ACT NA SOLN: 1.0000 | Freq: Every day | NASAL | 0 refills | Status: DC

## 2020-04-01 MED ORDER — OXYCODONE HCL 5 MG PO TABS: 5.0000 mg | ORAL_TABLET | Freq: Three times a day (TID) | ORAL | 0 refills | Status: DC | PRN

## 2020-04-01 MED ORDER — ESOMEPRAZOLE MAGNESIUM 40 MG PO CPDR: 40.0000 mg | DELAYED_RELEASE_CAPSULE | Freq: Every day | ORAL | 1 refills | Status: DC

## 2020-04-01 NOTE — Telephone Encounter (Signed)
[facility] Night - Client Nonclinical Telephone Record AccessNurse Client [place] Night - Client Client Site [place] Physician Ria Bush - MD Contact Type Call Who Is Calling Patient / Member / Family / Caregiver Caller Name [place] Phone Number [phone] Patient Name Robin Welch Patient DOB 01-May-1966 Call Type Message Only Information Provided Reason for Call Request to Reschedule Office Appointment Initial Comment Caller states she has an appointment this morning and needs it switched to a video appointment. Additional Comment I provided the caller with the office hours. Caller declined triage. Disp. Time Disposition Final User 04/01/2020 8:09:37 AM General Information Provided Yes Luan Pulling, Dawn Call Closed By: Epifania Gore Transaction Date/Time: 04/01/2020 8:07:09 AM (ET)

## 2020-04-01 NOTE — Progress Notes (Signed)
Virtual visit attempted through MyChart , a video enabled telemedicine application. Due to national recommendations of social distancing due to COVID-19, a virtual visit is felt to be most appropriate for this patient at this time. Reviewed limitations, risks, security and privacy concerns of performing a virtual visit and the availability of in person appointments. I also reviewed that there may be a patient responsible charge related to this service. The patient agreed to proceed.  Interactive audio and video telecommunications were attempted between myself and Venezia Sargeant, however failed due to patient having technical difficulties OR patient not having access to video capability.  We continued and completed visit with audio only.  Time: 4:50pm - 5:20pm  Patient location: home Provider location: [place] at [facility], office Persons participating in this virtual visit: patient, provider  If any vitals were documented, they were collected by patient at home unless specified below.    BP 119/80   Pulse (!) 102   Temp 98 F (36.7 C)   Ht 5\' 2"  (1.575 m)   Wt 110 lb (49.9 kg)   LMP 04/23/2015 (Approximate)   BMI 20.12 kg/m    CC: hosp f/u visit  Subjective:    Patient ID: Robin Welch, female    DOB: [birth year], 54 y.o.   MRN: [id number]  HPI: Robin Welch is a 54 y.o. female presenting on 04/01/2020 for Hospitalization Follow-up   Recent hospitalization for nausea, vomiting with coffee ground emesis that started 03/15/2020. Found hyponatremic with sodium of 121, K 3, WBC 17. Hgb dropped from 11.2 to 9.2. CXR then CT chest concerning for multifocal atypical PNA. Treated with IV unasyn, discharged on augmentin antibiotic. Legionella and strep pneum antigens negative. Blcx x2 negative (final). Tested negative for COVID. CT chest also showed thoracic spine fractures and remote rib fractures. Speech therapy recommended outpatient modified barium swallow. GI consulted, recommended nexium 40mg  bid  and carafate and outpatient [facility] f/u.   Severe upper back pain that started late June. Denies new falls or injury.  Due to worsening back pain, saw [facility] 04/01/2020- [phone]). Treated with naproysn and zofran. Records received after visit - Na down to 120, Hgb 11.1, plt 1000. CXR again showed T8 and 11 compression fractures.  Continued 2ppd smoker.   She took percocets for pain (acquired illicitly).  Denies alcohol or recreational drug use.   She took seroquel right before phone call so is somewhat sedated on phone today.  Persistent productive cough but no fever.    Admit date: 03/16/2020 Discharge date: 03/18/2020 TCM hosp f/u phone call completed 03/18/2020  Recommendations for Outpatient Follow-up:  1. Follow outpatient CBC/CMP 2. Follow outpatient MBS per speech therapy 3. Follow with [facility] GI as scheduled  4. Continue antibiotics for pneumonia 5. Pt with fractures of T spine, ribs - needs outpatient follow up for osteoporosis 6. Proximal R humerus fracture, concern for nonunion, follow outpatient  7. Follow outpatient CXR outpatient 8. Follow final cultures, urine legionella  Discharge Diagnoses:  Principal Problem:   Hematemesis Active Problems:   Bipolar 1 disorder (HCC)   COPD (chronic obstructive pulmonary disease) with chronic bronchitis (HCC)   Non-intractable vomiting with nausea   Chronic hyponatremia   GAD (generalized anxiety disorder)   Hyponatremia   Hypokalemia   Sepsis due to pneumonia [facility])  Discharge Condition: stable Diet recommendation: full liquid     Relevant past medical, surgical, family and social history reviewed and updated as indicated. Interim medical history since our last visit reviewed.  Allergies and medications reviewed and updated. Outpatient Medications Prior to Visit  Medication Sig Dispense Refill  . albuterol (VENTOLIN HFA) 108 (90 Base) MCG/ACT inhaler INHALE 2 PUFFS BY MOUTH EVERY 6 HOURS AS NEEDED FOR WHEEZING OR  SHORTNESS OF BREATH (Patient taking differently: Inhale 2 puffs into the lungs every 4 (four) hours as needed for wheezing. ) 54 g 0  . calcium carbonate (CALCIUM 600) 600 MG TABS tablet Take 1 tablet (600 mg total) by mouth daily with breakfast. 3 tablet   . cariprazine (VRAYLAR) capsule Take 3 mg by mouth daily.    . Cholecalciferol (VITAMIN D) 50 MCG (2000 UT) CAPS Take 1 capsule (2,000 Units total) by mouth daily. (Patient taking differently: Take 2,000 Units by mouth daily. )    . cholestyramine light (PREVALITE) 4 g packet Take 0.5 packets (2 g total) by mouth 2 (two) times daily. 30 packet 3  . esomeprazole (NEXIUM) 40 MG packet Take 40 mg by mouth 2 (two) times daily at 8 am and 10 pm. 60 each 11  . Multiple Vitamin (MULTIVITAMIN WITH MINERALS) TABS tablet Take 1 tablet by mouth daily.    . QUEtiapine (SEROQUEL) 200 MG tablet TAKE 4 TABLETS AT BEDTIME (Patient taking differently: Take 800 mg by mouth at bedtime. ) 360 tablet 0  . Suvorexant (BELSOMRA) 15 MG TABS Take 15 mg by mouth at bedtime. 90 tablet 0  . Suvorexant (BELSOMRA) 15 MG TABS Take 15 mg by mouth at bedtime. 30 tablet 2  . Tiotropium Bromide Monohydrate (SPIRIVA RESPIMAT) 2.5 MCG/ACT AERS Inhale 1 puff into the lungs daily. 4 g 6  . sucralfate (CARAFATE) 1 g tablet Take 1 tablet (1 g total) by mouth 4 (four) times daily. 120 tablet 1   No facility-administered medications prior to visit.     Per HPI unless specifically indicated in ROS section below Review of Systems Objective:  BP 119/80   Pulse (!) 102   Temp 98 F (36.7 C)   Ht 5\' 2"  (1.575 m)   Wt 110 lb (49.9 kg)   LMP 04/23/2015 (Approximate)   BMI 20.12 kg/m   Wt Readings from Last 3 Encounters:  04/01/20 110 lb (49.9 kg)  03/16/20 110 lb (49.9 kg)  11/21/19 110 lb 0.2 oz (49.9 kg)      Physical exam: Pulm: speaks in complete sentences without increased work of breathing Psych: sedated, some slurring of speech     Assessment & Plan:   Problem List  Items Addressed This Visit    Tobacco abuse   Thoracic compression fracture, closed, initial encounter ([place])    Of unsure acuity, imaging showed T8, T11 fractures. Now endorsing worsening pain, had to get percocets illicitly.  Advised against NSAIDs given severe GI history.  rec scheduled tylenol 500-1000mg  TID, will add oxycodone 5mg  PRN breakthrough pain #15 provided. Advised against taking illicit narcotic.  Did not start calcitonin given electrolyte abnormalities esp recent hypocalcemia noted.  Will refer to endo for further eval/management for osteoporosis.       Relevant Orders   Ambulatory referral to Endocrinology   Sepsis due to pneumonia [facility])    Seems improved from this standpoint after completing augmentin course.       Protein-calorie malnutrition ([place])    Update albumin.      Relevant Orders   Ambulatory referral to Endocrinology   Osteoporosis    Severe as evidenced by recurrent fractures, now with 2 thoracic compression fractures of uncertain acuity. Comorbidities limit treatment options.  Discussed endo referral for further eval/treatment of bone metabolism disorder. Malnutrition contributes.       Relevant Orders   Ambulatory referral to Endocrinology   Non-intractable vomiting with nausea - Primary    Has zofran PRN nausea. Advised to continue nexium 40mg  bid and start carafate (had not yet picked up).       Relevant Orders   Comprehensive metabolic panel   CBC with Differential/Platelet   RESOLVED: Gastroesophageal reflux disease with esophagitis and hemorrhage   Closed fracture of right proximal humerus   Relevant Orders   Ambulatory referral to Endocrinology   Closed fracture distal radius and ulna, right, sequela    S/p ORIF 04/2019, didn't heal. Encouraged ortho f/u.       Relevant Orders   Ambulatory referral to Endocrinology   Chronic hyponatremia    Update labs on Wednesday.  Thought dehydration with vomiting, psychogenic polydipsia, and  medications contributing. She is resistant to any dose change of antipsychotics.       Bipolar 1 disorder ([place])    Continues her high dose seroquel 200mg  4 tab nightly. Also on belsomra, vraylar.        Other Visit Diagnoses    Hypophosphatemia       Relevant Orders   Phosphorus   Ambulatory referral to Endocrinology       Meds ordered this encounter  Medications  . sucralfate (CARAFATE) 1 g tablet    Sig: Take 1 tablet (1 g total) by mouth 4 (four) times daily.    Dispense:  120 tablet    Refill:  0  . esomeprazole (NEXIUM) 40 MG capsule    Sig: Take 1 capsule (40 mg total) by mouth daily.    Dispense:  60 capsule    Refill:  1  . oxyCODONE (OXY IR/ROXICODONE) 5 MG immediate release tablet    Sig: Take 1 tablet (5 mg total) by mouth 3 (three) times daily as needed for severe pain or breakthrough pain.    Dispense:  15 tablet    Refill:  0  . DISCONTD: calcitonin, salmon, (MIACALCIN) 200 UNIT/ACT nasal spray    Sig: Place 1 spray into alternate nostrils daily. For 2 weeks then stop    Dispense:  3.7 mL    Refill:  0   Orders Placed This Encounter  Procedures  . Comprehensive metabolic panel    Standing Status:   Future    Standing Expiration Date:   04/03/2021  . CBC with Differential/Platelet    Standing Status:   Future    Standing Expiration Date:   04/03/2021  . Phosphorus    Standing Status:   Future    Standing Expiration Date:   04/03/2021  . Ambulatory referral to Endocrinology    Referral Priority:   Routine    Referral Type:   Consultation    Referral Reason:   Specialty Services Required    Number of Visits Requested:   1    I discussed the assessment and treatment plan with the patient. The patient was provided an opportunity to ask questions and all were answered. The patient agreed with the plan and demonstrated an understanding of the instructions. The patient was advised to call back or seek an in-person evaluation if the symptoms worsen or if the  condition fails to improve as anticipated.  Follow up plan: No follow-ups on file.  Ria Bush, MD

## 2020-04-02 ENCOUNTER — Telehealth: Payer: Self-pay

## 2020-04-02 NOTE — Telephone Encounter (Signed)
Pt left v/m that she did my chart video visit with DR G on 04/01/20 and pt also was seen at [facility] on 04/01/20. Pt was notified today that the Na and RBC were off. I could not see lab results in care everywhere and pt said she would just talk with Dr Darnell Level about this at her appt on 04/03/20 at 10:05 am. Dr Darnell Level said the appt tomorrow was lab only (Dr Darnell Level will put orders in for labs). Lab only appt scheduled for [facility] on 04/03/20 at 10:05 AM non fasting. Dr Darnell Level also asked if could get info from [place] on 04/01/20. I spoke with Manuela Schwartz at [place] on [address]; phone # [phone] and she said to update care everywhere which I did and now the 04/01/20 UC notes, labs and xrays are available under care everywhere. Pt request after Dr Darnell Level reviews the [facility] UC info to let pt know if she still needs to have labs done tomorrow at [facility]. Pt said cb in early AM would be OK.

## 2020-04-03 ENCOUNTER — Other Ambulatory Visit (INDEPENDENT_AMBULATORY_CARE_PROVIDER_SITE_OTHER): Payer: Medicare HMO

## 2020-04-03 ENCOUNTER — Telehealth: Payer: Self-pay | Admitting: Radiology

## 2020-04-03 ENCOUNTER — Other Ambulatory Visit: Payer: Self-pay

## 2020-04-03 ENCOUNTER — Telehealth (INDEPENDENT_AMBULATORY_CARE_PROVIDER_SITE_OTHER): Payer: Medicare HMO

## 2020-04-03 DIAGNOSIS — Z8781 Personal history of (healed) traumatic fracture: Secondary | ICD-10-CM | POA: Insufficient documentation

## 2020-04-03 DIAGNOSIS — R300 Dysuria: Secondary | ICD-10-CM

## 2020-04-03 DIAGNOSIS — R112 Nausea with vomiting, unspecified: Secondary | ICD-10-CM

## 2020-04-03 DIAGNOSIS — D500 Iron deficiency anemia secondary to blood loss (chronic): Secondary | ICD-10-CM

## 2020-04-03 DIAGNOSIS — S22000A Wedge compression fracture of unspecified thoracic vertebra, initial encounter for closed fracture: Secondary | ICD-10-CM | POA: Insufficient documentation

## 2020-04-03 DIAGNOSIS — M8000XG Age-related osteoporosis with current pathological fracture, unspecified site, subsequent encounter for fracture with delayed healing: Secondary | ICD-10-CM

## 2020-04-03 DIAGNOSIS — S42201A Unspecified fracture of upper end of right humerus, initial encounter for closed fracture: Secondary | ICD-10-CM

## 2020-04-03 LAB — CBC WITH DIFFERENTIAL/PLATELET
Basophils Absolute: 0.2 10*3/uL — ABNORMAL HIGH (ref 0.0–0.1)
Basophils Relative: 1.3 % (ref 0.0–3.0)
Eosinophils Absolute: 0.3 10*3/uL (ref 0.0–0.7)
Eosinophils Relative: 2 % (ref 0.0–5.0)
HCT: 33 % — ABNORMAL LOW (ref 36.0–46.0)
Hemoglobin: 11.2 g/dL — ABNORMAL LOW (ref 12.0–15.0)
Lymphocytes Relative: 6.4 % — ABNORMAL LOW (ref 12.0–46.0)
Lymphs Abs: 1 10*3/uL (ref 0.7–4.0)
MCHC: 34 g/dL (ref 30.0–36.0)
MCV: 92.2 fl (ref 78.0–100.0)
Monocytes Absolute: 0.7 10*3/uL (ref 0.1–1.0)
Monocytes Relative: 4.3 % (ref 3.0–12.0)
Neutro Abs: 13.1 10*3/uL — ABNORMAL HIGH (ref 1.4–7.7)
Neutrophils Relative %: 86 % — ABNORMAL HIGH (ref 43.0–77.0)
Platelets: 823 10*3/uL — ABNORMAL HIGH (ref 150.0–400.0)
RBC: 3.58 Mil/uL — ABNORMAL LOW (ref 3.87–5.11)
RDW: 14.2 % (ref 11.5–15.5)
WBC: 15.2 10*3/uL — ABNORMAL HIGH (ref 4.0–10.5)

## 2020-04-03 LAB — COMPREHENSIVE METABOLIC PANEL
ALT: 11 U/L (ref 0–35)
AST: 14 U/L (ref 0–37)
Albumin: 3.1 g/dL — ABNORMAL LOW (ref 3.5–5.2)
Alkaline Phosphatase: 133 U/L — ABNORMAL HIGH (ref 39–117)
BUN: 12 mg/dL (ref 6–23)
CO2: 29 mEq/L (ref 19–32)
Calcium: 8.2 mg/dL — ABNORMAL LOW (ref 8.4–10.5)
Chloride: 84 mEq/L — ABNORMAL LOW (ref 96–112)
Creatinine, Ser: 0.56 mg/dL (ref 0.40–1.20)
GFR: 111.75 mL/min (ref >60.00)
Glucose, Bld: 93 mg/dL (ref 70–99)
Potassium: 3.6 mEq/L (ref 3.5–5.1)
Sodium: 121 mEq/L — CL (ref 135–145)
Total Bilirubin: 0.4 mg/dL (ref 0.2–1.2)
Total Protein: 6.1 g/dL (ref 6.0–8.3)

## 2020-04-03 LAB — POC URINALSYSI DIPSTICK (AUTOMATED)
Bilirubin, UA: NEGATIVE
Blood, UA: NEGATIVE
Glucose, UA: NEGATIVE
Ketones, UA: NEGATIVE
Nitrite, UA: POSITIVE
Protein, UA: NEGATIVE
Spec Grav, UA: 1.01 (ref 1.010–1.025)
Urobilinogen, UA: 1 E.U./dL
pH, UA: 7 (ref 5.0–8.0)

## 2020-04-03 LAB — PHOSPHORUS: Phosphorus: 3.2 mg/dL (ref 2.3–4.6)

## 2020-04-03 NOTE — Telephone Encounter (Addendum)
Please notify urine overall looking ok - plz send culture but no obvious infection or blood noted in urine today. If ongoing pain rec OV for further evaluation.

## 2020-04-03 NOTE — Telephone Encounter (Signed)
Pt in office today for labs.  Mentioned she is having pain/burning with urination and urgency.  Says sxs started yesterday.    Pt gave urine sample.  Did UA, per Dr. Darnell Level.  Results documented.  Urine spun and drawn up for possible UCX to send out.

## 2020-04-03 NOTE — Telephone Encounter (Signed)
Yes she needs to keep appointment for labs today as her sodium again was very low and platelets were very high.

## 2020-04-03 NOTE — Addendum Note (Signed)
Addended by: Ria Bush on: 04/03/2020 02:17 PM   Modules accepted: Orders

## 2020-04-03 NOTE — Assessment & Plan Note (Addendum)
Severe as evidenced by recurrent fractures, now with 2 thoracic compression fractures of uncertain acuity. Comorbidities limit treatment options.  Discussed endo referral for further eval/treatment of bone metabolism disorder. Malnutrition contributes.

## 2020-04-03 NOTE — Telephone Encounter (Signed)
[facility] lab called a critical Sodium, 121. Results given to Dr Danise Mina.

## 2020-04-03 NOTE — Assessment & Plan Note (Signed)
Seems improved from this standpoint after completing augmentin course.

## 2020-04-03 NOTE — Telephone Encounter (Signed)
Called patient - sodium is very low, platelets are very high - in known chronic hyponatremia thought hypovolemic hyponatremia contributing s/p IVF correction at latest hospitalization and chronic thrombocytosis. Advised if any concern for dehydration needs to go back to ER for correction again. Encouraged regular ginger ale, gatorade, chicken broth to try to maintain adequate hydration.  I asked her to schedule expedited office visit for further evaluation.

## 2020-04-03 NOTE — Assessment & Plan Note (Signed)
Of unsure acuity, imaging showed T8, T11 fractures. Now endorsing worsening pain, had to get percocets illicitly.  Advised against NSAIDs given severe GI history.  rec scheduled tylenol 500-1000mg  TID, will add oxycodone 5mg  PRN breakthrough pain #15 provided. Advised against taking illicit narcotic.  Did not start calcitonin given electrolyte abnormalities esp recent hypocalcemia noted.  Will refer to endo for further eval/management for osteoporosis.

## 2020-04-03 NOTE — Assessment & Plan Note (Signed)
Has zofran PRN nausea. Advised to continue nexium 40mg  bid and start carafate (had not yet picked up).

## 2020-04-03 NOTE — Telephone Encounter (Signed)
I spoke with pt and pt has already had labs drawn and will wait for cb about results. FYI to Dr Darnell Level.

## 2020-04-03 NOTE — Telephone Encounter (Signed)
Spoke with pt relaying Dr. Synthia Innocent message.  Pt verbalizes understanding.  Says she is currently at [facility] then had to disconnect call.

## 2020-04-03 NOTE — Assessment & Plan Note (Signed)
Update albumin.

## 2020-04-03 NOTE — Telephone Encounter (Signed)
Pt left v.m that pt had U/A done this morning while at [facility] lab and pt request cb with results; pt is hurting and if cannot get soon cb will go to UC.Please advise.

## 2020-04-03 NOTE — Assessment & Plan Note (Signed)
Continues her high dose seroquel 200mg  4 tab nightly. Also on belsomra, vraylar.

## 2020-04-03 NOTE — Assessment & Plan Note (Signed)
Update labs on Wednesday.  Thought dehydration with vomiting, psychogenic polydipsia, and medications contributing. She is resistant to any dose change of antipsychotics.

## 2020-04-03 NOTE — Assessment & Plan Note (Signed)
S/p ORIF 04/2019, didn't heal. Encouraged ortho f/u.

## 2020-04-03 NOTE — Assessment & Plan Note (Signed)
Severe, now on nexium 40mg  bid. Will start carafate course (Rx at pharmacy)

## 2020-04-04 LAB — URINE CULTURE
MICRO NUMBER:: 10786848
SPECIMEN QUALITY:: ADEQUATE

## 2020-04-04 NOTE — Telephone Encounter (Signed)
Spoke with pt scheduling OV on 04/10/20 at 1:00 for 30 min, per Dr. Darnell Level.

## 2020-04-04 NOTE — Telephone Encounter (Signed)
Noted  

## 2020-04-09 ENCOUNTER — Ambulatory Visit: Payer: Medicare HMO | Admitting: Addiction (Substance Use Disorder)

## 2020-04-10 ENCOUNTER — Ambulatory Visit: Payer: Medicare HMO | Admitting: Family Medicine

## 2020-04-10 NOTE — Telephone Encounter (Signed)
Have been trying to connect with pt since March to come in for labs and prolia inj.   Noticed pt was in on 04/03/20 for labs.  CrCl is 88/.36 and Ca is abnormal at 8.2  Tried to contact pt to see if she was still interested in getting the prolia inj but no answer and VM is full.

## 2020-04-15 ENCOUNTER — Telehealth: Payer: Self-pay | Admitting: Family Medicine

## 2020-04-15 NOTE — Telephone Encounter (Addendum)
Agreed.  Thanks.  I will defer to spine clinic.  Routed to PCP as FYI.

## 2020-04-15 NOTE — Telephone Encounter (Signed)
Spoke with pt for more details of pain.  C/o severe bilateral low back pain.  Started about 3 wks ago.  Prescribed oxycodone 5 mg tab by Dr. Darnell Level on 04/01/20.  States it helped ease pain but only had a few pills.  Lying still helps pain but any movement pain is severe.  Pt agrees to virtual visit.  Scheduled virtual visit on 04/17/20 at 9:00 with Dr. Glori Bickers.  Pt states she will also try to contact her spine doc.  FYI to Dr. Damita Dunnings.

## 2020-04-15 NOTE — Telephone Encounter (Signed)
Will you address in Dr. Synthia Innocent absence?  Spoke with pt about her back pain.  Says she cannot get to the office for visit due to no transportation.  Declines virtual visit due to discomfort.  Pt states she has OV on 04/23/20 with Dr. Darnell Level.  But wants to know if something can be called in until she has that appt or any suggestions on what she can take OTC.  Pt aware Dr. Darnell Level is out of the office.  Plz advise.

## 2020-04-15 NOTE — Telephone Encounter (Signed)
Pt called She doesn't know what she to do. She is in bed with back pain.  All her pain meds are gone.  She is wanting to know what to do she doesn't want to take stuff that she shouldn't take  Please advise

## 2020-04-15 NOTE — Telephone Encounter (Signed)
I need extra detail here.  If she is in such severe pain as not to be able to do a virtual visit, then I would suspect she is acutely ill to the point of needing ER eval.    I think it makes sense to get virtual visit set up asap and see what details you can get about her back pain in the meantime so I can see about options.  Thanks.

## 2020-04-17 ENCOUNTER — Encounter: Payer: Self-pay | Admitting: Family Medicine

## 2020-04-17 ENCOUNTER — Other Ambulatory Visit: Payer: Self-pay

## 2020-04-17 ENCOUNTER — Telehealth (INDEPENDENT_AMBULATORY_CARE_PROVIDER_SITE_OTHER): Payer: Medicare HMO | Admitting: Family Medicine

## 2020-04-17 DIAGNOSIS — S22000A Wedge compression fracture of unspecified thoracic vertebra, initial encounter for closed fracture: Secondary | ICD-10-CM | POA: Diagnosis not present

## 2020-04-17 MED ORDER — OXYCODONE HCL 5 MG PO TABS: 5.0000 mg | ORAL_TABLET | Freq: Three times a day (TID) | ORAL | 0 refills | Status: DC | PRN

## 2020-04-17 NOTE — Assessment & Plan Note (Signed)
Pt c/o worsening /intolerable back and rib pain from her compression fractures (pcp is not in office)  Desires refill of oxycodone for this pain (has failed management with tylenol and does not use topical medicines)  Adv against nsaids in light of past hx of GI bleed (per pt) [facility] and needs coverage until then  Voiced understanding of risks of this medication incl sedation and habit  Rev the state database -no fills since last one from Dr Darnell Level Wrote for #12 tabs to use sparingly between now and orthopedic appt  inst to go to ER if worse

## 2020-04-17 NOTE — Patient Instructions (Addendum)
I refilled 12 oxycodone pills to use for severe pain as needed (sparingly) while waiting for your orthopedic appointment on the 25th  This can cause sedation/addiction and constipation-only use if absolutely necessary

## 2020-04-17 NOTE — Progress Notes (Signed)
Virtual Visit via Video Note  I connected with Robin Welch on 04/17/20 at  9:00 AM EDT by a video enabled telemedicine application and verified that I am speaking with the correct person using two identifiers.  Location: Patient: home (car) Provider: office   I discussed the limitations of evaluation and management by telemedicine and the availability of in person appointments. The patient expressed understanding and agreed to proceed.  Parties involved in encounter  Patient: Robin Welch  Provider:  Loura Pardon MD    History of Present Illness: 54 yo pt of Dr Darnell Level presents with low back pain  Does not shoot down her leg  Feels like and electric shock  No numbness    Cannot come in due to no tramsportation   Per pt she has 5 cracked ribs and comp fx Had recent bleeding ulcer and pneumonia  That is improved   She is having a lot of pain  Miserable  Was given oxycodone - knows she has to be careful with it  Oxycodone 5 mg (written for 3 times per day)  Given 15 tabs on 8/2   Her fill per the web site was 8/2 for 15 pills at [facility]  She also takes belsorma for sleep   otc- aleve  Has not used lidocaine patches -pain is in so many areas  Has not used voltaren gel  Tried a muscle relaxer - do not help her     h/o OP with T8 and T11 compression fx  She sneezed and had a pop Then moved a table  Sometime in June   She has an appt with the spine clinic next week-[facility]  appt is on 8/25   Is a smoker  Wants to quit and is struggling with it  Could not tolerate medications to quit   Patient Active Problem List   Diagnosis Date Noted  . Thoracic compression fracture, closed, initial encounter ([place]) 04/03/2020  . Hypokalemia 03/16/2020  . Sepsis due to pneumonia ([place]) 03/16/2020  . Ulcer of esophagus with bleeding   . Esophageal dysphagia 09/14/2019  . Erosive esophagitis   . Esophageal stricture   . Anemia   . Upper GI bleed 07/18/2019  . Frequent falls  07/15/2019  . Closed fracture of right proximal humerus 07/09/2019  . Protein-calorie malnutrition ([place]) 07/08/2019  . Ataxia 07/08/2019  . Hypocalcemia 07/06/2019  . Pulmonary nodule less than 1 cm in diameter with moderate to high risk for malignant neoplasm 06/01/2019  . Closed fracture distal radius and ulna, right, sequela 04/20/2019  . Diarrhea of presumed infectious origin 01/31/2019  . GAD (generalized anxiety disorder) 09/02/2018  . Insomnia 09/02/2018  . Low serum vitamin B12 07/19/2018  . Memory loss 06/20/2018  . Osteoporosis 06/09/2018  . Chronic constipation 04/19/2018  . Gastroesophageal reflux disease 04/19/2018  . Hematemesis 04/19/2018  . Chronic hyponatremia 03/11/2017  . Iron deficiency anemia 03/11/2017  . Thrombocytosis ([place]) 03/02/2017  . Non-intractable vomiting with nausea 03/01/2017  . History of cocaine dependence ([place])   . Migraine 05/08/2015  . Hoarseness of voice 04/04/2015  . Weight loss, unintentional 03/08/2015  . COPD (chronic obstructive pulmonary disease) with chronic bronchitis ([place]) 09/22/2013  . Tobacco abuse 12/18/2010  . Bipolar 1 disorder ([place]) 12/18/2010   Past Medical History:  Diagnosis Date  . Anemia   . Anxiety   . Barrett esophagus   . Bipolar 1 disorder [facility])    sees Dr. Candis Schatz psych [phone])  . Closed fracture distal radius  and ulna, left, initial encounter 04/20/2019   S/p ORIF 04/2019 [facility])   . COPD (chronic obstructive pulmonary disease) ([place]) 03/2013   hyperinflation by CXR  . Elevated alkaline phosphatase level   . Esophagitis   . Hiatal hernia   . History of cocaine dependence ([place]) latest 2020   undergoing detox  . History of stomach ulcers   . Hx: UTI (urinary tract infection)   . Migraine    "stopped in ~ 2008" (03/11/2017)  . Muscle strain of gluteal region, right, subsequent encounter 10/05/2018   By MRI 2020  . Osteoporosis 06/09/2018   DEXA 05/2017 - T score -2.5 L hip, -1.3 spine  . Positive PPD  1989   s/p CXR and INH 6 mo  . Seizure ([place]) X 1   "from takiing too many headache medicine?" (03/11/2017)  . Smoker    Past Surgical History:  Procedure Laterality Date  . BIOPSY  07/19/2019   Procedure: BIOPSY;  Surgeon: Gatha Mayer, MD;  Location: [facility];  Service: Endoscopy;;  . COLONOSCOPY  07/2018   poor colon prep - rec repeat ([place])  . DILATION AND CURETTAGE OF UTERUS  X 1   S/P miscarriage  . ESOPHAGOGASTRODUODENOSCOPY  03/2017   candidal esophagitis, treated, LA grade D esophagitis, 4cm HH ([place])  . ESOPHAGOGASTRODUODENOSCOPY  07/2018   severe reflux esophagitis, neg candida ([place])  . ESOPHAGOGASTRODUODENOSCOPY (EGD) WITH PROPOFOL N/A 07/19/2019   Procedure: ESOPHAGOGASTRODUODENOSCOPY (EGD) WITH PROPOFOL;  Surgeon: Gatha Mayer, MD;  Location: [facility];  Service: Endoscopy;  Laterality: N/A;  . ESOPHAGOGASTRODUODENOSCOPY (EGD) WITH PROPOFOL N/A 10/24/2019   Procedure: ESOPHAGOGASTRODUODENOSCOPY (EGD) WITH PROPOFOL;  Surgeon: Mauri Pole, MD;  Location: [facility];  Service: Endoscopy;  Laterality: N/A;  . ESOPHAGOGASTRODUODENOSCOPY (EGD) WITH PROPOFOL N/A 11/21/2019   Procedure: ESOPHAGOGASTRODUODENOSCOPY (EGD) WITH PROPOFOL;  Surgeon: Mauri Pole, MD;  Location: [facility];  Service: Endoscopy;  Laterality: N/A;  . LAPAROSCOPIC CHOLECYSTECTOMY  2006  . ORIF WRIST FRACTURE Right 04/08/2019   Procedure: OPEN REDUCTION INTERNAL FIXATION (ORIF) DISTAL RADIUS/ULNA FRACTURE;  Surgeon: Verner Mould, MD;  Location: [place];  Service: Orthopedics;  Laterality: Right;  . TUBAL LIGATION  2003   Social History   Tobacco Use  . Smoking status: Current Every Day Smoker    Packs/day: 1.00    Years: 36.00    Pack years: 36.00    Types: Cigarettes  . Smokeless tobacco: Never Used  Vaping Use  . Vaping Use: Never used  Substance Use Topics  . Alcohol use: Not Currently    Alcohol/week: 0.0 standard drinks    Comment: 03/11/2017  "stopped 09/18/2015; never an alcoholic"  . Drug use: Yes    Types: Cocaine    Comment: not currently   Family History  Problem Relation Age of Onset  . Diabetes Mother        T2DM  . Breast cancer Mother 41  . Dementia Mother   . Lymphoma Mother   . Coronary artery disease Father 78       MI  . Anxiety disorder Paternal Aunt   . Anxiety disorder Cousin   . Depression Cousin   . ADD / ADHD Child   . Anxiety disorder Child   . Depression Child   . Stroke Neg Hx   . Colon cancer Neg Hx   . Cancer - Colon Neg Hx   . Esophageal cancer Neg Hx   . Stomach cancer Neg Hx   . Rectal cancer Neg  Hx    Allergies  Allergen Reactions  . Prozac [Fluoxetine Hcl] Swelling and Rash  . Doxycycline Nausea And Vomiting  . Celexa [Citalopram Hydrobromide] Other (See Comments)    Possible allergic reaction  . Chantix [Varenicline Tartrate] Swelling    Mouth swelling  . Hydroxyzine Other (See Comments)    Not effective  . Klonopin [Clonazepam] Other (See Comments)    Caused instablility. Not able to drive due to over medicated  . Lunesta [Eszopiclone] Other (See Comments)    Not effective  . Sonata [Zaleplon] Other (See Comments)    Not effective  . Trazodone And Nefazodone Other (See Comments)    ineffective  . Zolpimist [Zolpidem Tartrate] Other (See Comments)    Not effective  . Azithromycin Other (See Comments)    Reaction not recalled by the patient   Current Outpatient Medications on File Prior to Visit  Medication Sig Dispense Refill  . albuterol (VENTOLIN HFA) 108 (90 Base) MCG/ACT inhaler INHALE 2 PUFFS BY MOUTH EVERY 6 HOURS AS NEEDED FOR WHEEZING OR SHORTNESS OF BREATH (Patient taking differently: Inhale 2 puffs into the lungs every 4 (four) hours as needed for wheezing. ) 54 g 0  . calcium carbonate (CALCIUM 600) 600 MG TABS tablet Take 1 tablet (600 mg total) by mouth daily with breakfast. 3 tablet   . cariprazine (VRAYLAR) capsule Take 3 mg by mouth daily.    .  Cholecalciferol (VITAMIN D) 50 MCG (2000 UT) CAPS Take 1 capsule (2,000 Units total) by mouth daily. (Patient taking differently: Take 2,000 Units by mouth daily. )    . cholestyramine light (PREVALITE) 4 g packet Take 0.5 packets (2 g total) by mouth 2 (two) times daily. 30 packet 3  . esomeprazole (NEXIUM) 40 MG capsule Take 1 capsule (40 mg total) by mouth daily. 60 capsule 1  . esomeprazole (NEXIUM) 40 MG packet Take 40 mg by mouth 2 (two) times daily at 8 am and 10 pm. 60 each 11  . Multiple Vitamin (MULTIVITAMIN WITH MINERALS) TABS tablet Take 1 tablet by mouth daily.    . QUEtiapine (SEROQUEL) 200 MG tablet TAKE 4 TABLETS AT BEDTIME (Patient taking differently: Take 800 mg by mouth at bedtime. ) 360 tablet 0  . sucralfate (CARAFATE) 1 g tablet Take 1 tablet (1 g total) by mouth 4 (four) times daily. 120 tablet 0  . Suvorexant (BELSOMRA) 15 MG TABS Take 15 mg by mouth at bedtime. 90 tablet 0  . Suvorexant (BELSOMRA) 15 MG TABS Take 15 mg by mouth at bedtime. 30 tablet 2  . Tiotropium Bromide Monohydrate (SPIRIVA RESPIMAT) 2.5 MCG/ACT AERS Inhale 1 puff into the lungs daily. 4 g 6   No current facility-administered medications on file prior to visit.   Review of Systems  Constitutional: Negative for chills, fever and malaise/fatigue.  HENT: Negative for congestion, ear pain, sinus pain and sore throat.   Eyes: Negative for blurred vision, discharge and redness.  Respiratory: Negative for cough, shortness of breath and stridor.   Cardiovascular: Negative for chest pain, palpitations and leg swelling.  Gastrointestinal: Negative for abdominal pain, diarrhea, nausea and vomiting.  Musculoskeletal: Positive for back pain and myalgias.       Back and rib pain  Skin: Negative for rash.  Neurological: Negative for dizziness and headaches.    Observations/Objective: Patient appears well, in no distress (does c/o severe pain and sitting still) Weight is baseline  No facial swelling or  asymmetry Normal voice-not hoarse and no slurred speech  No obvious tremor or mobility impairment Moving neck and UEs normally Able to hear the call well  No cough or shortness of breath during interview  Talkative and mentally sharp with no cognitive changes No skin changes on face or neck , no rash or pallor Affect is anxious     Assessment and Plan: Problem List Items Addressed This Visit      Musculoskeletal and Integument   Thoracic compression fracture, closed, initial encounter ([place])    Pt c/o worsening /intolerable back and rib pain from her compression fractures (pcp is not in office)  Desires refill of oxycodone for this pain (has failed management with tylenol and does not use topical medicines)  Adv against nsaids in light of past hx of GI bleed (per pt) [facility] and needs coverage until then  Voiced understanding of risks of this medication incl sedation and habit  Rev the state database -no fills since last one from Dr Darnell Level Wrote for #12 tabs to use sparingly between now and orthopedic appt  inst to go to ER if worse           Follow Up Instructions: I refilled 12 oxycodone pills to use for severe pain as needed (sparingly) while waiting for your orthopedic appointment on the 25th  This can cause sedation/addiction and constipation-only use if absolutely necessary    I discussed the assessment and treatment plan with the patient. The patient was provided an opportunity to ask questions and all were answered. The patient agreed with the plan and demonstrated an understanding of the instructions.   The patient was advised to call back or seek an in-person evaluation if the symptoms worsen or if the condition fails to improve as anticipated.     Loura Pardon, MD

## 2020-04-18 ENCOUNTER — Ambulatory Visit (INDEPENDENT_AMBULATORY_CARE_PROVIDER_SITE_OTHER): Payer: Medicare HMO | Admitting: Addiction (Substance Use Disorder)

## 2020-04-18 ENCOUNTER — Encounter: Payer: Self-pay | Admitting: Addiction (Substance Use Disorder)

## 2020-04-18 DIAGNOSIS — F1421 Cocaine dependence, in remission: Secondary | ICD-10-CM | POA: Diagnosis not present

## 2020-04-18 DIAGNOSIS — F3130 Bipolar disorder, current episode depressed, mild or moderate severity, unspecified: Secondary | ICD-10-CM | POA: Diagnosis not present

## 2020-04-18 DIAGNOSIS — G894 Chronic pain syndrome: Secondary | ICD-10-CM | POA: Diagnosis not present

## 2020-04-18 NOTE — Progress Notes (Signed)
[facility] Counselor/Therapist Progress Note  Patient ID: Robin Welch, MRN: [id number],    Date: 04/18/2020  Time Spent: 71mins  Treatment Type: Individual Therapy  Reported Symptoms: despairing, in pain.      Mental Status Exam:  Appearance:   Casual and Well Groomed     Behavior:  Sharing  Motor:  Normal  Speech/Language:   Normal Rate  Affect:  Appropriate, Congruent and Full Range  Mood:  sad  Thought process:  normal  Thought content:    Rumination  Sensory/Perceptual disturbances:    WNL  Orientation:  x4  Attention:  Good  Concentration:  Good  Memory:  WNL  Fund of knowledge:   Good  Insight:    Good  Judgment:   Good  Impulse Control:  Fair   Risk Assessment: Danger to Self:  No Self-injurious Behavior: No Danger to Others: No Duty to Warn:no Physical Aggression / Violence:No  Access to Firearms a concern: No  Gang Involvement:No   Virtual Visit via TELEPHONE : I connected with client by telephone, with their informed consent, and verified client privacy and that I am speaking with the correct person using two identifiers. I discussed the limitations, risks, security and privacy concerns of performing psychotherapy and management service virtually and confirmed their location. I also discussed with the patient that there may be a patient responsible charge related to this service and to confirm with the front desk if their insurance covers teletherapy. I also discussed with the patient the availability of in person appointments. The patient expressed understanding and agreed to proceed. I discussed the treatment planning with the client. The client was provided an opportunity to ask questions and all were answered. The client agreed with the plan and demonstrated an understanding of the instructions. The client was advised to call our office if symptoms worsen or feel they are in a crisis state and need immediate contact. Client also reminded of a crisis line  number and to use 9-1-1 if there's an emergency.  Therapist Location: office; Client Location: home.  Subjective: Client reported having a hard time not thinking about the pain shes in. Client reported her back is broken and due to her arthritis, nothing is healing right and shes in ongoing pain, with little relief. Client tearful in session and client discussed the despair she feels as a result and therapist used MI & CBT with client to help support her and help challenge client's negative thoughts. Client made progress in session processing it all and thought of solutions to help her ask for what she needs from her doctors to help her function more. Therapist assessed for safety and client denied SI/HI/AVH and reported no cravings or thoughts of using. Therapist led client in a focused mindfulness session to help client notice the areas she is not in pain and help her find relief.   Interventions: Cognitive Behavioral Therapy, Mindfulness Meditation and Motivational Interviewing   Diagnosis:   ICD-10-CM   1. Bipolar I disorder, most recent episode depressed ([place])  F31.30   2. Chronic pain syndrome  G89.4   3. History of cocaine dependence ([place])  F14.21    Plan of Care: Client to return for weekly therapy with Sammuel Cooper, therapist, for outpatient therapy, to review again in 3-6 months. Client is to CONTINUE seeing medication provider for support of mood management, triggers and cravings.   Client to follow through with adding more support for them in their SUD txt: engaging in 12  step meetings. Client to engage in [place] txt and RPT relapse prevention therapy AEB coming to therapy weekly and implementing recovery oriented coping strategies to help reduce use of substances and to find relief from symptoms of MH disorders and trauma that cause them to want to escape from reality and numb their mind&body.  Client also to create more stability & structure AEB goal planning with therapist to assist in  helping them get into a healthy rhythm/ schedule that can help to calm their nervous system and begin to build more healthy brain neuropathways.  Client to engage in CBT: challenging negative internal ruminationsand self-talk AEB expressing toxic thoughts and challenging them with truth.  Client to practice DBT distress tolerance skills (such as distress tolerance and emotion regulation) to practice achieving wise mind and to build support for dealing with cravings AEB learning to ride the wave of emotion/sensation/etc instead of seeking negative self-soothing techniques: ie using substances to numb self.  Client to utilize BSP (brainspotting) with therapist to help client identify and process triggers for their MH/ SUD with goal of reducing SUDs by 33% each session. Client to prioritize sleep 8+ hours each week night AEB going to bed by 10pm each night. Client participated in the treatment planning of their therapy. Client agreed with the plan and understands what to do if there is a crisis: call 9-1-1 and/or crisis line given by therapist.  Barnie Del, LCSW, LCAS, CCTP, CCS-I, BSP

## 2020-04-21 NOTE — Telephone Encounter (Addendum)
Do want to do prolia but need to get calcium in normal range prior.  Difficult situation - will touch base with her at [facility] this coming week.

## 2020-04-23 ENCOUNTER — Ambulatory Visit: Payer: Medicare HMO | Admitting: Family Medicine

## 2020-04-29 ENCOUNTER — Ambulatory Visit: Payer: Medicare HMO | Admitting: Addiction (Substance Use Disorder)

## 2020-04-30 ENCOUNTER — Ambulatory Visit: Payer: Medicare HMO | Admitting: Family Medicine

## 2020-05-02 ENCOUNTER — Encounter: Payer: Self-pay | Admitting: Psychiatry

## 2020-05-02 ENCOUNTER — Telehealth (INDEPENDENT_AMBULATORY_CARE_PROVIDER_SITE_OTHER): Payer: Medicare HMO | Admitting: Psychiatry

## 2020-05-02 DIAGNOSIS — F3130 Bipolar disorder, current episode depressed, mild or moderate severity, unspecified: Secondary | ICD-10-CM | POA: Diagnosis not present

## 2020-05-02 DIAGNOSIS — F319 Bipolar disorder, unspecified: Secondary | ICD-10-CM

## 2020-05-02 DIAGNOSIS — F5105 Insomnia due to other mental disorder: Secondary | ICD-10-CM | POA: Diagnosis not present

## 2020-05-02 MED ORDER — QUETIAPINE FUMARATE 200 MG PO TABS: 800.0000 mg | ORAL_TABLET | Freq: Every day | ORAL | 0 refills | Status: DC

## 2020-05-02 MED ORDER — CARIPRAZINE HCL 1.5 MG PO CAPS: 1.5000 mg | ORAL_CAPSULE | Freq: Every day | ORAL | 0 refills | Status: DC
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
## 2020-05-07 ENCOUNTER — Ambulatory Visit: Payer: Medicare HMO | Admitting: Family Medicine

## 2020-05-08 ENCOUNTER — Ambulatory Visit: Payer: Medicare HMO | Admitting: Addiction (Substance Use Disorder)

## 2020-05-10 ENCOUNTER — Other Ambulatory Visit: Payer: Self-pay | Admitting: Family Medicine

## 2020-05-13 ENCOUNTER — Ambulatory Visit (INDEPENDENT_AMBULATORY_CARE_PROVIDER_SITE_OTHER): Payer: Medicare HMO | Admitting: Addiction (Substance Use Disorder)

## 2020-05-13 DIAGNOSIS — F1421 Cocaine dependence, in remission: Secondary | ICD-10-CM | POA: Diagnosis not present

## 2020-05-13 DIAGNOSIS — F3130 Bipolar disorder, current episode depressed, mild or moderate severity, unspecified: Secondary | ICD-10-CM

## 2020-05-15 ENCOUNTER — Encounter: Payer: Self-pay | Admitting: Addiction (Substance Use Disorder)
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
## 2020-05-17 ENCOUNTER — Telehealth: Payer: Self-pay | Admitting: Psychiatry

## 2020-05-17 NOTE — Telephone Encounter (Signed)
Reviewed thank you. Let me know if I can help

## 2020-05-17 NOTE — Telephone Encounter (Signed)
Robin Welch's Arman Filter has been order from Pt. Assistance.  It will take 7-10 business days to receive.

## 2020-05-17 NOTE — Telephone Encounter (Signed)
Robin Gauze, do I need to send this somewhere?

## 2020-05-17 NOTE — Telephone Encounter (Signed)
Pt called and needs a refill on her Vraylar. She is in the patient assistance program and she says we order it for her. Please call her at [phone].

## 2020-05-20 ENCOUNTER — Telehealth: Payer: Self-pay | Admitting: Psychiatry

## 2020-05-20 NOTE — Telephone Encounter (Signed)
Disregard patient will be picking up samples and Leda Gauze can help her

## 2020-05-20 NOTE — Telephone Encounter (Signed)
I called pt to  let her know. She will pick up tomorrow.

## 2020-05-20 NOTE — Telephone Encounter (Signed)
Leda Gauze noted that she order Vraylar for pt thru patient assistance on 9/17 when told to. However, she did not know about the dosage change and only ordered her the RF on the 3.0mg . We will likely need to send them a new RX with the dose change and see if we can reorder the medication.

## 2020-05-20 NOTE — Telephone Encounter (Signed)
Robin Welch, please remind patient she is able to request a refill when she is getting low herself. Usually takes 7-10 days to receive

## 2020-05-20 NOTE — Telephone Encounter (Signed)
Reviewed thank you 

## 2020-05-20 NOTE — Telephone Encounter (Signed)
We just ordered pt medication, Vraylar, through pt assistance. Pt has today left and then runs out. Can she get samples to pick up?

## 2020-05-23 ENCOUNTER — Encounter: Payer: Self-pay | Admitting: Family Medicine

## 2020-05-25 ENCOUNTER — Other Ambulatory Visit: Payer: Self-pay | Admitting: Psychiatry

## 2020-05-28 NOTE — Telephone Encounter (Signed)
Last refill 09/25, this is for next refills

## 2020-05-31 HISTORY — PX: ESOPHAGOGASTRODUODENOSCOPY: SHX1529

## 2020-06-05 ENCOUNTER — Encounter: Payer: Self-pay | Admitting: Family Medicine

## 2020-06-05 DIAGNOSIS — K449 Diaphragmatic hernia without obstruction or gangrene: Secondary | ICD-10-CM | POA: Diagnosis not present

## 2020-06-05 DIAGNOSIS — K2100 Gastro-esophageal reflux disease with esophagitis, without bleeding: Secondary | ICD-10-CM | POA: Diagnosis not present

## 2020-06-05 DIAGNOSIS — Z79899 Other long term (current) drug therapy: Secondary | ICD-10-CM | POA: Diagnosis not present

## 2020-06-05 DIAGNOSIS — F17200 Nicotine dependence, unspecified, uncomplicated: Secondary | ICD-10-CM | POA: Diagnosis not present

## 2020-06-05 DIAGNOSIS — K2270 Barrett's esophagus without dysplasia: Secondary | ICD-10-CM | POA: Diagnosis not present

## 2020-06-05 DIAGNOSIS — K219 Gastro-esophageal reflux disease without esophagitis: Secondary | ICD-10-CM | POA: Diagnosis not present

## 2020-06-05 DIAGNOSIS — Z9049 Acquired absence of other specified parts of digestive tract: Secondary | ICD-10-CM | POA: Diagnosis not present

## 2020-06-05 DIAGNOSIS — F319 Bipolar disorder, unspecified: Secondary | ICD-10-CM | POA: Diagnosis not present

## 2020-06-05 DIAGNOSIS — K222 Esophageal obstruction: Secondary | ICD-10-CM | POA: Diagnosis not present

## 2020-06-05 DIAGNOSIS — J449 Chronic obstructive pulmonary disease, unspecified: Secondary | ICD-10-CM | POA: Diagnosis not present

## 2020-06-10 ENCOUNTER — Encounter: Payer: Self-pay | Admitting: Family Medicine

## 2020-06-10 ENCOUNTER — Ambulatory Visit (INDEPENDENT_AMBULATORY_CARE_PROVIDER_SITE_OTHER): Payer: Medicare HMO | Admitting: Addiction (Substance Use Disorder)

## 2020-06-10 DIAGNOSIS — F1421 Cocaine dependence, in remission: Secondary | ICD-10-CM

## 2020-06-10 DIAGNOSIS — F3130 Bipolar disorder, current episode depressed, mild or moderate severity, unspecified: Secondary | ICD-10-CM

## 2020-06-10 NOTE — Progress Notes (Signed)
[facility] Counselor/Therapist Progress Note  Patient ID: Robin Welch, MRN: [id number],    Date: 06/10/2020  Time Spent: 84mins  Treatment Type: Individual Therapy  Reported Symptoms: scared & in pain.   Mental Status Exam:  Appearance:   NA     Behavior:  Appropriate and Sharing  Motor:  unknown  Speech/Language:   Normal Rate  Affect:  Appropriate, Congruent and Full Range  Mood:  anxious  Thought process:  flight of ideas  Thought content:    Rumination  Sensory/Perceptual disturbances:    WNL  Orientation:  x4  Attention:  Good  Concentration:  Good  Memory:  WNL  Fund of knowledge:   Good  Insight:    Fair  Judgment:   Good  Impulse Control:  Fair   Risk Assessment: Danger to Self:  No Self-injurious Behavior: No Danger to Others: No Duty to Warn:no Physical Aggression / Violence:No  Access to Firearms a concern: No  Gang Involvement:No   Virtual Visit via TELEPHONE : I connected with client by telephone, with their informed consent, and verified client privacy and that I am speaking with the correct person using two identifiers. I discussed the limitations, risks, security and privacy concerns of performing psychotherapy and management service virtually and confirmed their location. I also discussed with the patient that there may be a patient responsible charge related to this service and to confirm with the front desk if their insurance covers teletherapy. I also discussed with the patient the availability of in person appointments. The patient expressed understanding and agreed to proceed. I discussed the treatment planning with the client. The client was provided an opportunity to ask questions and all were answered. The client agreed with the plan and demonstrated an understanding of the instructions. The client was advised to call our office if symptoms worsen or feel they are in a crisis state and need immediate contact. Client also reminded of a crisis  line number and to use 9-1-1 if there's an emergency.  Therapist Location: office; Client Location: home.  Subjective: Client reported feeling scared bec disability wants a meeting with her and shes afraid they will take her disability and her livelyhood away. And reported that shes in so much pain with broken bones from osteoperosis that she wont be able to work and will lose her home. Therapist used MI & CBT with client to validate her pain and fears, support her in processing her anxieties and thoughts while also challenging the irrational ones. Therapist used mindfulness to help get client in the present, taking gone day at a time and not letting her mind run away with worries. Therapist also encouraged client to attend 12 step meetings to help support her in processing maintaining a stable mindset. Therapist assessed for safety and client denied SI/HI/AVH and reported continued sobriety and plans for maintaining it.   Interventions: Cognitive Behavioral Therapy, Mindfulness Meditation, Motivational Interviewing and 12-Step   Diagnosis:   ICD-10-CM   1. Bipolar I disorder, most recent episode depressed ([place])  F31.30   2. History of cocaine dependence ([place])  F14.21      Plan of Care: Client to return for weekly therapy with Sammuel Cooper, therapist, for outpatient therapy, to review again in 3-6 months. Client is to CONTINUE seeing medication provider for support of mood management, triggers and cravings.   Client to follow through with adding more support for them in their SUD txt: engaging in 12 step meetings. Client to engage in  SA txt and RPT relapse prevention therapy AEB coming to therapy weekly and implementing recovery oriented coping strategies to help reduce use of substances and to find relief from symptoms of MH disorders and trauma that cause them to want to escape from reality and numb their mind&body.  Client also to create more stability & structure AEB goal planning with therapist  to assist in helping them get into a healthy rhythm/ schedule that can help to calm their nervous system and begin to build more healthy brain neuropathways.  Client to engage in CBT: challenging negative internal ruminationsand self-talk AEB expressing toxic thoughts and challenging them with truth.  Client to practice DBT distress tolerance skills (such as distress tolerance and emotion regulation) to practice achieving wise mind and to build support for dealing with cravings AEB learning to ride the wave of emotion/sensation/etc instead of seeking negative self-soothing techniques: ie using substances to numb self.  Client to utilize BSP (brainspotting) with therapist to help client identify and process triggers for their MH/ SUD with goal of reducing SUDs by 33% each session. Client to prioritize sleep 8+ hours each week night AEB going to bed by 10pm each night. Client participated in the treatment planning of their therapy. Client agreed with the plan and understands what to do if there is a crisis: call 9-1-1 and/or crisis line given by therapist.  Barnie Del, LCSW, LCAS, CCTP, CCS-I, BSP

## 2020-06-14 ENCOUNTER — Ambulatory Visit: Payer: Medicare HMO | Admitting: Internal Medicine

## 2020-06-14 NOTE — Progress Notes (Deleted)
Name: Robin Welch  MRN/ DOB: [id number], 15-Jan-1966    Age/ Sex: 54 y.o., female    PCP: Ria Bush, MD   Reason for Endocrinology Evaluation: Osteoporosis      Date of Initial Endocrinology Evaluation: 06/14/2020     HPI: Robin Welch is a 54 y.o. female with a past medical history of GERD, bipolar disorder , and COPD. The patient presented for initial endocrinology clinic visit on 06/14/2020 for consultative assistance with her osteoporosis   Pt was diagnosed with osteoporosis:2019  Menarche at age :  Menopausal at age :  Fracture Hx: fragility fracture of the foot (2019), humeral fracture (2020) Hx of HRT:  FH of osteoporosis or hip fracture:  Prior Hx of anti-estrogenic therapy : Prior Hx of anti-resorptive therapy :   Of note , the pt has hx of hypokalemia, hyponatremia and hypocalcemia as well as elevated Alkaline Phosphatase    She is on Calcium 600 mg  Vitamin D 2000 iu daily      HISTORY:  Past Medical History:  Past Medical History:  Diagnosis Date  . Anemia   . Anxiety   . Barrett esophagus   . Bipolar 1 disorder [facility])    sees Dr. Candis Schatz psych [phone])  . Closed fracture distal radius and ulna, left, initial encounter 04/20/2019   S/p ORIF 04/2019 [facility])   . COPD (chronic obstructive pulmonary disease) ([place]) 03/2013   hyperinflation by CXR  . Elevated alkaline phosphatase level   . Esophagitis   . Hiatal hernia   . History of cocaine dependence ([place]) latest 2020   undergoing detox  . History of stomach ulcers   . Hx: UTI (urinary tract infection)   . Migraine    "stopped in ~ 2008" (03/11/2017)  . Muscle strain of gluteal region, right, subsequent encounter 10/05/2018   By MRI 2020  . Osteoporosis 06/09/2018   DEXA 05/2017 - T score -2.5 L hip, -1.3 spine  . Positive PPD 1989   s/p CXR and INH 6 mo  . Seizure ([place]) X 1   "from takiing too many headache medicine?" (03/11/2017)  . Smoker     Past Surgical History:  Past  Surgical History:  Procedure Laterality Date  . BIOPSY  07/19/2019   Procedure: BIOPSY;  Surgeon: Gatha Mayer, MD;  Location: [facility];  Service: Endoscopy;;  . COLONOSCOPY  07/2018   poor colon prep - rec repeat ([facility])  . DILATION AND CURETTAGE OF UTERUS  X 1   S/P miscarriage  . ESOPHAGOGASTRODUODENOSCOPY  03/2017   candidal esophagitis, treated, LA grade D esophagitis, 4cm HH ([facility])  . ESOPHAGOGASTRODUODENOSCOPY  07/2018   severe reflux esophagitis, neg candida ([facility])  . ESOPHAGOGASTRODUODENOSCOPY  09/29/2019   oozing esophageal ulcers, benign appearing strictures ([facility])  . ESOPHAGOGASTRODUODENOSCOPY  05/2020   grade D esophagitis, benign severe stenosis dilated, 7cm HH ([facility] Dr Mariea Clonts)  . ESOPHAGOGASTRODUODENOSCOPY (EGD) WITH PROPOFOL N/A 07/19/2019   Procedure: ESOPHAGOGASTRODUODENOSCOPY (EGD) WITH PROPOFOL;  Surgeon: Gatha Mayer, MD;  Location: [facility];  Service: Endoscopy;  Laterality: N/A;  . ESOPHAGOGASTRODUODENOSCOPY (EGD) WITH PROPOFOL N/A 10/24/2019   Procedure: ESOPHAGOGASTRODUODENOSCOPY (EGD) WITH PROPOFOL;  Surgeon: Mauri Pole, MD;  Location: [facility];  Service: Endoscopy;  Laterality: N/A;  . ESOPHAGOGASTRODUODENOSCOPY (EGD) WITH PROPOFOL N/A 11/21/2019   Procedure: ESOPHAGOGASTRODUODENOSCOPY (EGD) WITH PROPOFOL;  Surgeon: Mauri Pole, MD;  Location: [facility];  Service: Endoscopy;  Laterality: N/A;  . LAPAROSCOPIC CHOLECYSTECTOMY  2006  . ORIF WRIST FRACTURE Right  04/08/2019   Procedure: OPEN REDUCTION INTERNAL FIXATION (ORIF) DISTAL RADIUS/ULNA FRACTURE;  Surgeon: Verner Mould, MD;  Location: [place];  Service: Orthopedics;  Laterality: Right;  . TUBAL LIGATION  2003      Social History:  reports that she has been smoking cigarettes. She has a 36.00 pack-year smoking history. She has never used smokeless tobacco. She reports previous alcohol use. She reports current drug use. Drug: Cocaine.  Family History:  family history includes ADD / ADHD in her child; Anxiety disorder in her child, cousin, and paternal aunt; Breast cancer (age of onset: 74) in her mother; Coronary artery disease (age of onset: 82) in her father; Dementia in her mother; Depression in her child and cousin; Diabetes in her mother; Lymphoma in her mother.   HOME MEDICATIONS: Allergies as of 06/14/2020      Reactions   Prozac [fluoxetine Hcl] Swelling, Rash   Doxycycline Nausea And Vomiting   Celexa [citalopram Hydrobromide] Other (See Comments)   Possible allergic reaction   Chantix [varenicline Tartrate] Swelling   Mouth swelling   Hydroxyzine Other (See Comments)   Not effective   Klonopin [clonazepam] Other (See Comments)   Caused instablility. Not able to drive due to over medicated   Lunesta [eszopiclone] Other (See Comments)   Not effective   Sonata [zaleplon] Other (See Comments)   Not effective   Trazodone And Nefazodone Other (See Comments)   ineffective   Zolpimist [zolpidem Tartrate] Other (See Comments)   Not effective   Azithromycin Other (See Comments)   Reaction not recalled by the patient      Medication List       Accurate as of June 14, 2020  7:27 AM. If you have any questions, ask your nurse or doctor.        albuterol 108 (90 Base) MCG/ACT inhaler Commonly known as: VENTOLIN HFA INHALE 2 PUFFS BY MOUTH EVERY 6 HOURS AS NEEDED FOR WHEEZING OR SHORTNESS OF BREATH What changed: See the new instructions.   Belsomra 15 MG Tabs Generic drug: Suvorexant Take 15 mg by mouth at bedtime.   Belsomra 15 MG Tabs Generic drug: Suvorexant Take 15 mg by mouth at bedtime.   Belsomra 15 MG Tabs Generic drug: Suvorexant TAKE ONE TABLET BY MOUTH AT BEDTIME   calcium carbonate 600 MG Tabs tablet Commonly known as: Calcium 600 Take 1 tablet (600 mg total) by mouth daily with breakfast.   cariprazine capsule Commonly known as: VRAYLAR Take 3 mg by mouth daily.   cariprazine capsule Commonly  known as: Vraylar Take 1 capsule (1.5 mg total) by mouth daily. Take with 3 mg capsule to equal total dose of 4.5 mg daily   cholestyramine light 4 g packet Commonly known as: Prevalite Take 0.5 packets (2 g total) by mouth 2 (two) times daily.   esomeprazole 40 MG packet Commonly known as: NexIUM Take 40 mg by mouth 2 (two) times daily at 8 am and 10 pm.   esomeprazole 40 MG capsule Commonly known as: NexIUM Take 1 capsule (40 mg total) by mouth daily.   multivitamin with minerals Tabs tablet Take 1 tablet by mouth daily.   oxyCODONE 5 MG immediate release tablet Commonly known as: Oxy IR/ROXICODONE Take 1 tablet (5 mg total) by mouth 3 (three) times daily as needed for severe pain or breakthrough pain.   QUEtiapine 200 MG tablet Commonly known as: SEROQUEL Take 4 tablets (800 mg total) by mouth at bedtime.   Spiriva Respimat 2.5 MCG/ACT  Aers Generic drug: Tiotropium Bromide Monohydrate Inhale 1 puff into the lungs daily.   sucralfate 1 g tablet Commonly known as: CARAFATE TAKE 1 TABLET(1 GRAM) BY MOUTH FOUR TIMES DAILY   Vitamin D 50 MCG (2000 UT) Caps Take 1 capsule (2,000 Units total) by mouth daily.         REVIEW OF SYSTEMS: A comprehensive ROS was conducted with the patient and is negative except as per HPI and below:  ROS     OBJECTIVE:  VS: LMP 04/23/2015 (Approximate)    Wt Readings from Last 3 Encounters:  04/01/20 110 lb (49.9 kg)  03/16/20 110 lb (49.9 kg)  11/21/19 110 lb 0.2 oz (49.9 kg)     EXAM: General: Pt appears well and is in NAD  Hydration: Well-hydrated with moist mucous membranes and good skin turgor  Eyes: External eye exam normal without stare, lid lag or exophthalmos.  EOM intact.  PERRL.  Ears, Nose, Throat: Hearing: Grossly intact bilaterally Dental: Good dentition  Throat: Clear without mass, erythema or exudate  Neck: General: Supple without adenopathy. Thyroid: Thyroid size normal.  No goiter or nodules appreciated. No  thyroid bruit.  Lungs: Clear with good BS bilat with no rales, rhonchi, or wheezes  Heart: Auscultation: RRR.  Abdomen: Normoactive bowel sounds, soft, nontender, without masses or organomegaly palpable  Extremities: Gait and station: Normal gait  Digits and nails: No clubbing, cyanosis, petechiae, or nodes Head and neck: Normal alignment and mobility BL UE: Normal ROM and strength. BL LE: No pretibial edema normal ROM and strength.  Skin: Hair: Texture and amount normal with gender appropriate distribution Skin Inspection: No rashes, acanthosis nigricans/skin tags. No lipohypertrophy Skin Palpation: Skin temperature, texture, and thickness normal to palpation  Neuro: Cranial nerves: II - XII grossly intact  Cerebellar: Normal coordination and movement; no tremor Motor: Normal strength throughout DTRs: 2+ and symmetric in UE without delay in relaxation phase  Mental Status: Judgment, insight: Intact Orientation: Oriented to time, place, and person Memory: Intact for recent and remote events Mood and affect: No depression, anxiety, or agitation     DATA REVIEWED: ***    ASSESSMENT/PLAN/RECOMMENDATIONS:   1. ***    Medications :  Signed electronically by: Mack Guise, MD  [facility] Endocrinology  [address] [place], [place] [address] Phone: [phone] FAX: [phone]   CC: Ria Bush, MD [place] [address] Phone: [phone] Fax: [phone]   Return to Endocrinology clinic as below: Future Appointments  Date Time Provider [place]  06/14/2020  7:50 AM Elliyah Liszewski, Melanie Crazier, MD [facility] None  06/17/2020  2:00 PM Barnie Del, LCSW [facility] None  06/24/2020  2:00 PM Barnie Del, LCSW [facility] None  07/01/2020  1:00 PM Barnie Del, LCSW [facility] None

## 2020-06-17 ENCOUNTER — Ambulatory Visit: Payer: Medicare HMO | Admitting: Addiction (Substance Use Disorder)

## 2020-06-24 ENCOUNTER — Ambulatory Visit: Payer: Medicare HMO | Admitting: Addiction (Substance Use Disorder)

## 2020-06-30 IMAGING — CT CT WRIST*R* W/O CM
4 of 7 series · 14 of 36 positions shown, 15 images · non-contrast
Comparison: Radiographs 04/08/2019

CLINICAL DATA: Follow-up radius and ulnar fractures. Evaluate
healing.

EXAM:
CT OF THE RIGHT WRIST WITHOUT CONTRAST
TECHNIQUE: Multidetector CT imaging of the right wrist was performed according
to the standard protocol. Multiplanar CT image reconstructions were
also generated.

[Series 5: wrist 1.50 br60 s3 axial bone hd fov · axial · 0.19mm/px · z∈[-742,-654]mm · 3 of 221 slices shown]
[im 56/221  bone]
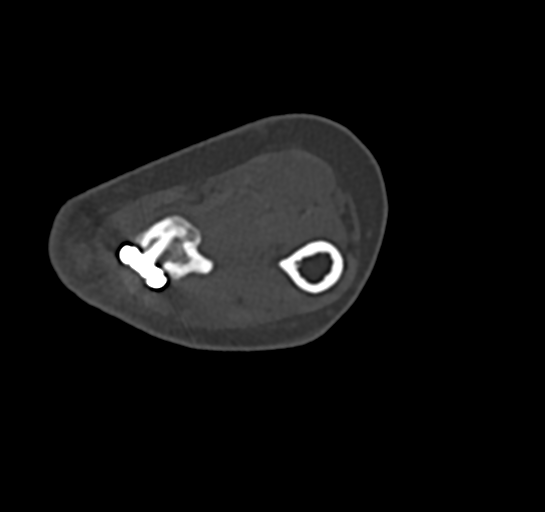
[im 111/221  bone]
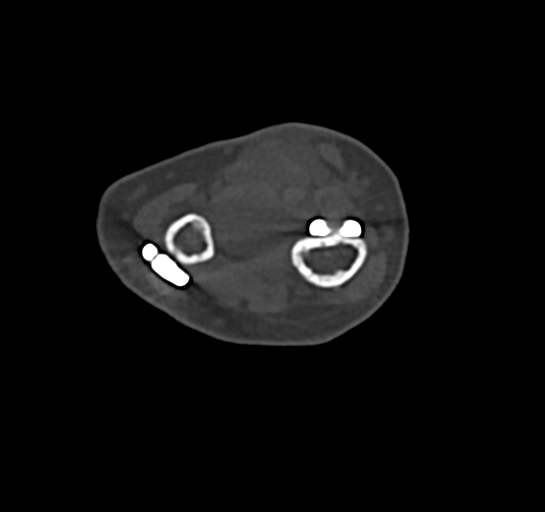
[im 166/221  bone]
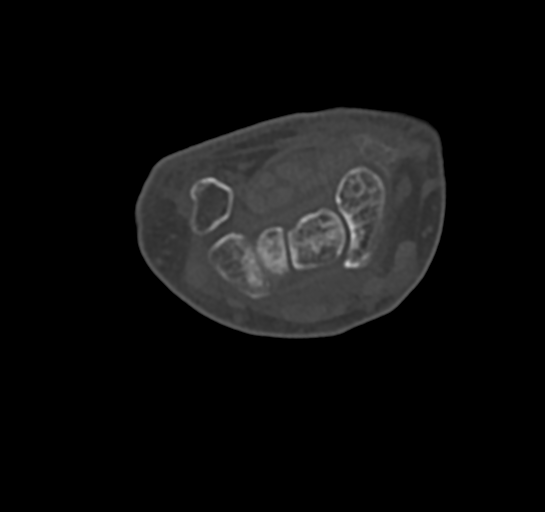

[Series 7: wrist 1.50 br40 s3 axial st hd fov · axial · 0.19mm/px · z∈[-750,-646]mm · 4 of 221 slices shown, 5 images]
[im 45/221  soft-tissue]
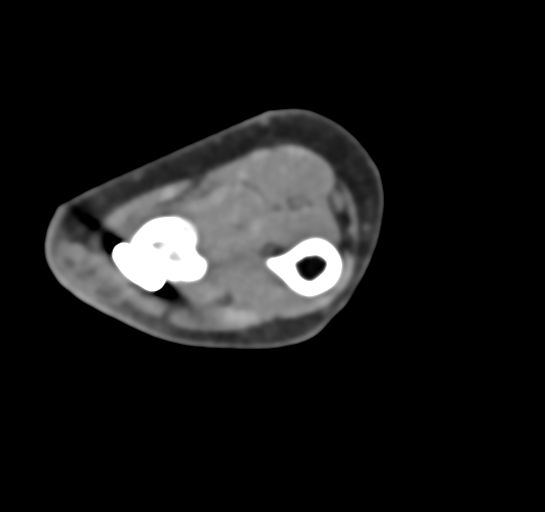
[im 45/221  bone]
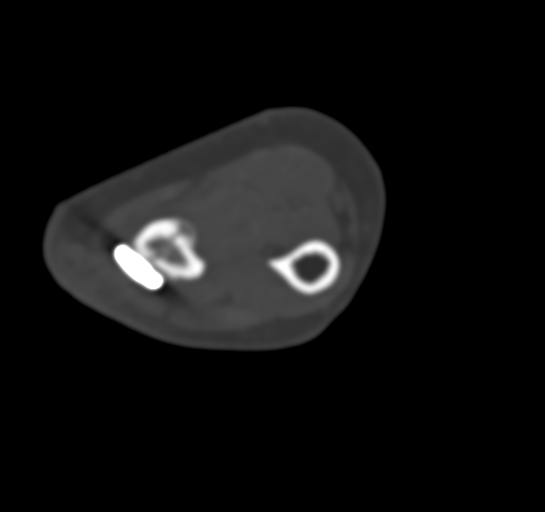
[im 89/221  bone]
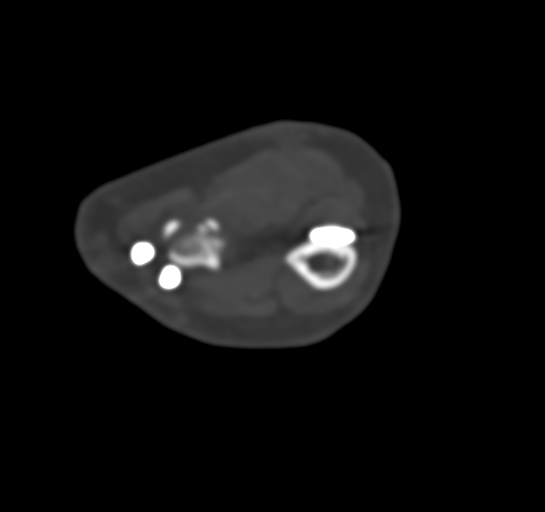
[im 133/221  bone]
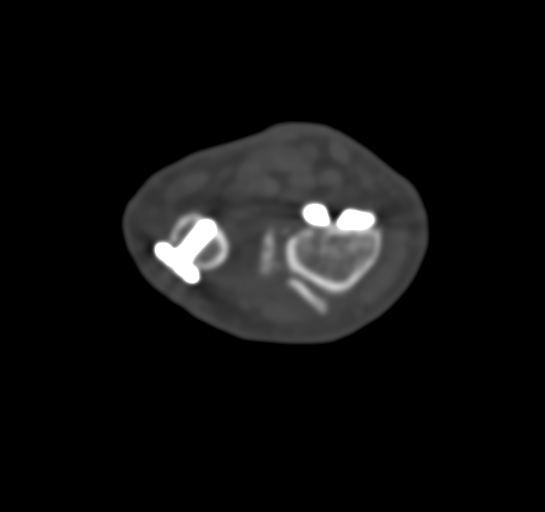
[im 177/221  bone]
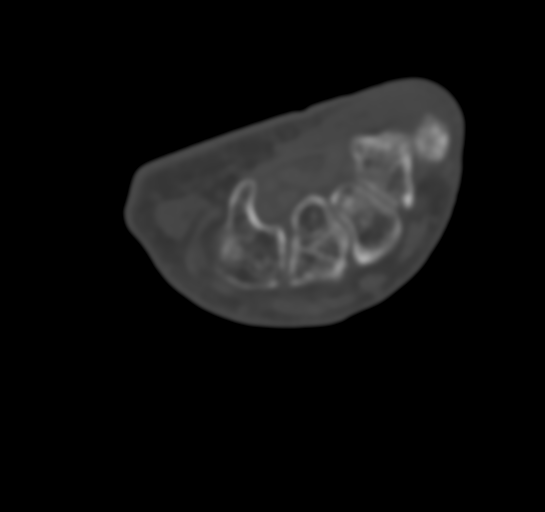

[Series 12: wrist 1.50 br60 s3 cor bone hd fov · coronal · 0.25mm/px · 1 of 186 slices shown]
[im 93/186  bone]
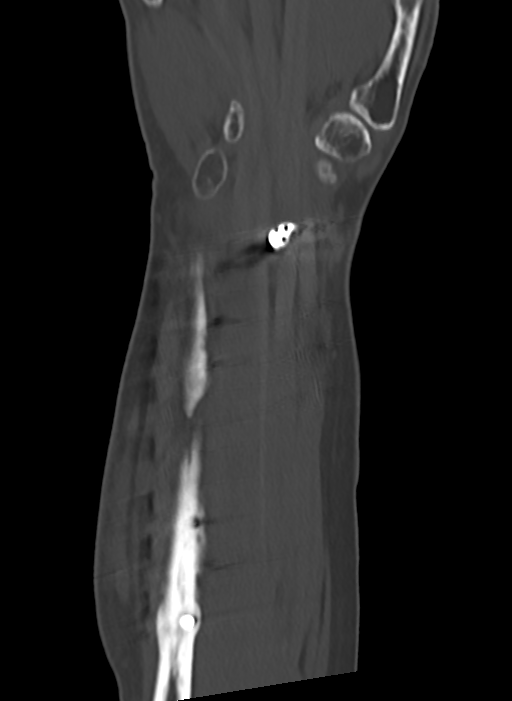

[Series 16: wrist 1.50 br60 s3 sag bone hd fov · sagittal · 0.29mm/px · 6 of 161 slices shown]
[im 27/161  bone]
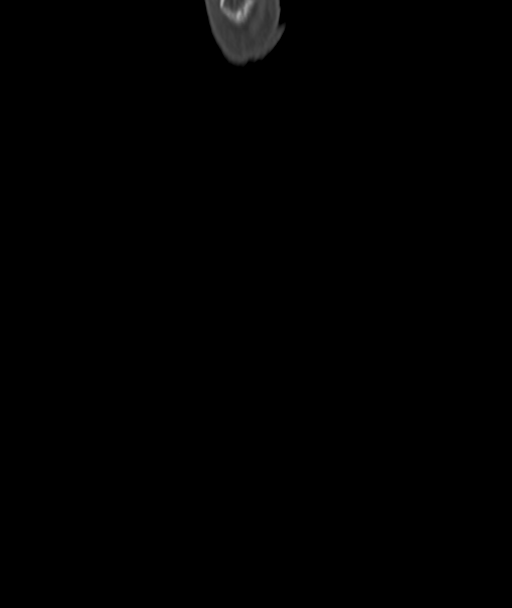
[im 43/161  soft-tissue]
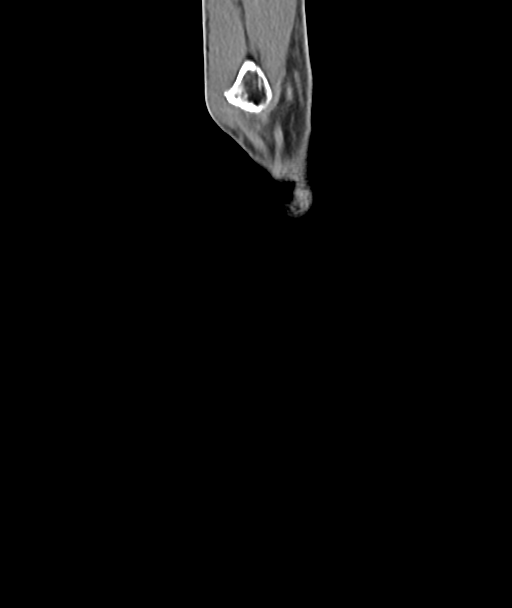
[im 54/161  bone]
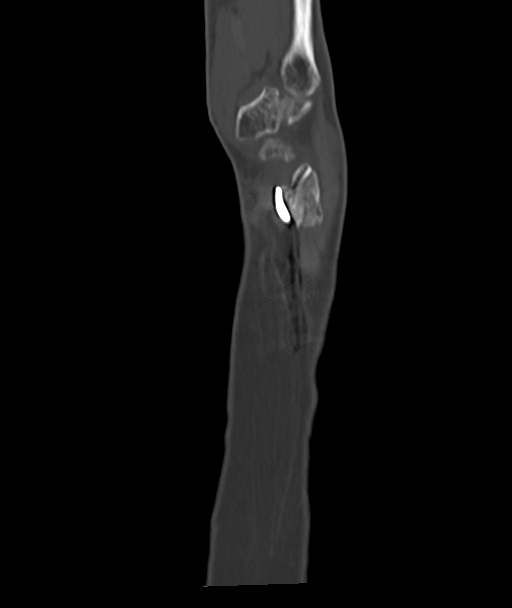
[im 81/161  bone]
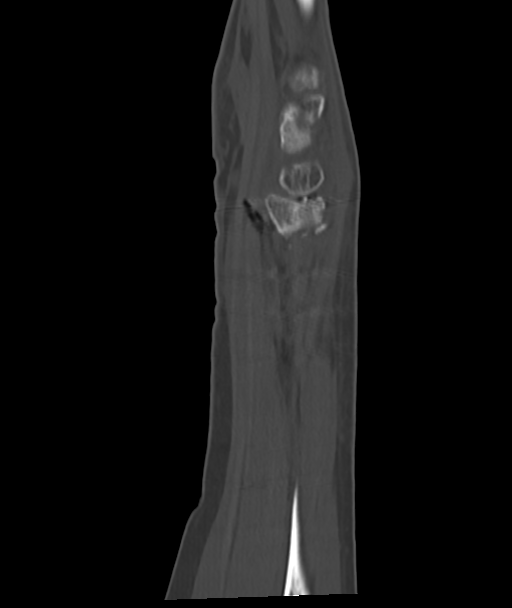
[im 107/161  bone]
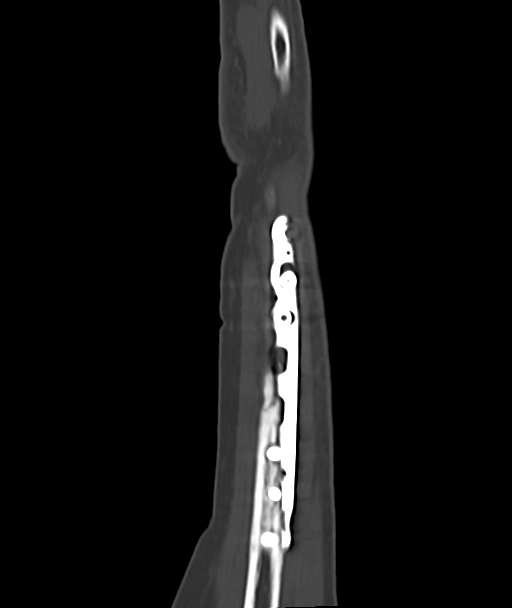
[im 134/161  bone]
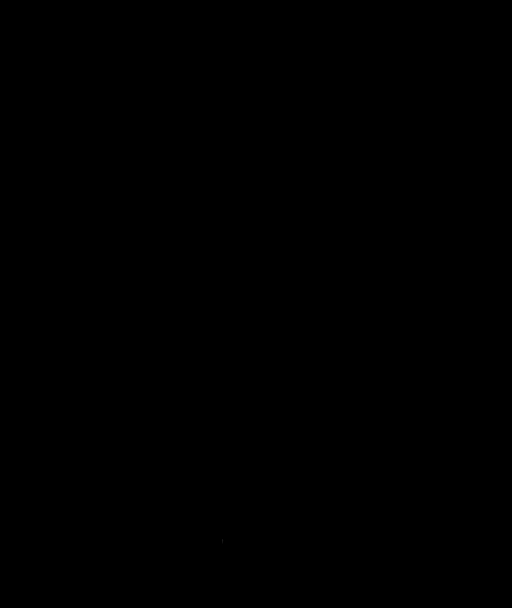

[14 of 36 positions shown; findings below may reference images not displayed]

FINDINGS: There is a long compression plate on the ulnar aspect of the ulna
with multiple screws. I do not see any solid osseous bridging at the
fracture site. Some attempted callus formation along the radial
aspect of the ulna but it does appear to be bridging.

There is a volar radial plate and multiple screws transfixing the
distal radius fracture. I do not see any solid healing changes. Some
areas of attempted bony ingrowth but no solid union. Also, I do not
see any articular bone covering the distal screws which appear to be
in the joint.

Widened scapholunate joint space suggesting ligamentous disruption.
There is moderate osteoporosis noted.
IMPRESSION: 1. No significant healing changes involving the ulnar fracture. No
bony ingrowth or osseous bridging is identified.
2. No significant healing of the distal radius fracture. Significant
bony resorptive changes. I do not see any articular bone covering
the distal screws some of which protrude into the joint.

## 2020-07-01 ENCOUNTER — Ambulatory Visit: Payer: Medicare HMO | Admitting: Addiction (Substance Use Disorder)

## 2020-08-14 ENCOUNTER — Ambulatory Visit: Payer: Medicare HMO | Admitting: Internal Medicine

## 2020-08-27 ENCOUNTER — Telehealth: Payer: Self-pay | Admitting: Psychiatry

## 2020-08-27 NOTE — Telephone Encounter (Signed)
Robin Welch called to request a reorder of her [name] from pt assistance, but it can't be reordered because her enrollment expires 08/30/20 and all refills have been used.  I have sent her a new application twice and she said she never got it.  I am mailing another one to her but she will be getting anymore through Pt. Assistance until she reapplies.  She of course she wants samples.  Should I pull some samples for her?  She also doesn't have a follow up appt scheduled.

## 2020-09-04 NOTE — Telephone Encounter (Signed)
Robin Welch did get the application to reapply for Pt. Assistance for her Vraylar.  She will bring it buy when she picks up her samples.

## 2020-09-11 ENCOUNTER — Encounter: Payer: Self-pay | Admitting: Psychiatry

## 2020-09-11 ENCOUNTER — Other Ambulatory Visit: Payer: Self-pay

## 2020-09-11 ENCOUNTER — Ambulatory Visit (INDEPENDENT_AMBULATORY_CARE_PROVIDER_SITE_OTHER): Payer: Medicare PPO | Admitting: Psychiatry

## 2020-09-11 VITALS — BP 107/79 | HR 103 | Wt 125.0 lb

## 2020-09-11 DIAGNOSIS — F5105 Insomnia due to other mental disorder: Secondary | ICD-10-CM | POA: Diagnosis not present

## 2020-09-11 DIAGNOSIS — F319 Bipolar disorder, unspecified: Secondary | ICD-10-CM

## 2020-09-11 DIAGNOSIS — F902 Attention-deficit hyperactivity disorder, combined type: Secondary | ICD-10-CM

## 2020-09-11 MED ORDER — AMPHETAMINE-DEXTROAMPHET ER 30 MG PO CP24: 30.0000 mg | ORAL_CAPSULE | Freq: Every day | ORAL | 0 refills | Status: DC

## 2020-09-11 MED ORDER — QUETIAPINE FUMARATE 200 MG PO TABS: 800.0000 mg | ORAL_TABLET | Freq: Every day | ORAL | 1 refills | Status: DC

## 2020-09-11 NOTE — Progress Notes (Signed)
   09/11/20 1332  Facial and Oral Movements  Muscles of Facial Expression 0  Lips and Perioral Area 1  Jaw 0  Tongue 0  Extremity Movements  Upper (arms, wrists, hands, fingers) 0  Lower (legs, knees, ankles, toes) 0  Trunk Movements  Neck, shoulders, hips 0  Overall Severity  Severity of abnormal movements (highest score from questions above) 0  Incapacitation due to abnormal movements 0  Patient's awareness of abnormal movements (rate only patient's report) 0  AIMS Total Score  AIMS Total Score 1

## 2020-09-11 NOTE — Progress Notes (Signed)
Robin Welch [id number] 20-Jan-1966 55 y.o.  Subjective:   Patient ID:  Robin Welch is a 55 y.o. (DOB Dec 03, 1965) female.  Chief Complaint:  Chief Complaint  Patient presents with   ADHD   Follow-up    Bipolar, Anxiety    HPI Robin Welch presents to the office today for follow-up of Mood disorder and anxiety. She reports that she has he been "great... I have been doing so good for the last 6 months." She reoprts that Vraylar has been helpful for her. Denies depressed mood. Denies any recent manic s/s. She reports that she is able to sleep 10-12 hours. She reports that she has gained 20 lbs and has had desired wt gain. Wt dropped to 87 lbs. Has been drinking protein shakes and taking vitamins. Stopped taking Belsomra and weaned herself off.   She reports that she has been taking some ADHD medication, Adderall XR 30 mg q am. "It has helped me tremendously" with her energy and motivation. Denies any adverse effects with Adderall. She reports that she is cleaning her house and keeping up with things. She reports improved concentration. She reports that she is able to read now. She reports that she is able to complete things. Anxiety has been well controlled. Denies any recent panic attacks. Denies SI.   She is substitute teaching. She reports that her family has commented on how well she is doing.   Reports that she was diagnosed with ADHD in the past and all 3 of her children have been dx'd and treated for ADHD.   She reports that she has been going to AA several times a week.   Past Psychiatric Medication Trials: Xanax- Misused Klonopin Seroquel- Helpful for sleep and anxiety.  Olanzapine Geodon Abilify Lithium Lamictal Depakote Buspar Hydroxyzine Gabapentin Prozac- Hives, edema Celexa- Possible allergic reaction Paxil Wellbutrin XL Sonata Ambien Zolpimist Lunesta Trazodone   AIMS   Flowsheet Row Office Visit from 09/11/2020 in [place] Office Visit  from 11/20/2019 in [place] Visit from 10/13/2019 in [place] Total Score 1 0 0    PHQ2-9   [place] Visit from 06/21/2017 in [place] at [facility] Visit from 12/07/2014 in [place] at [facility] Total Score 0 2       Review of Systems:  Review of Systems  Cardiovascular: Negative for palpitations.  Musculoskeletal: Negative for gait problem.  Neurological: Negative for tremors.  Psychiatric/Behavioral:       Please refer to HPI    Medications: I have reviewed the patient's current medications.  Current Outpatient Medications  Medication Sig Dispense Refill   amphetamine-dextroamphetamine (ADDERALL XR) 30 MG 24 hr capsule Take 1 capsule (30 mg total) by mouth daily. 30 capsule 0   [START ON 10/09/2020] amphetamine-dextroamphetamine (ADDERALL XR) 30 MG 24 hr capsule Take 1 capsule (30 mg total) by mouth daily. 30 capsule 0   [START ON 11/06/2020] amphetamine-dextroamphetamine (ADDERALL XR) 30 MG 24 hr capsule Take 1 capsule (30 mg total) by mouth daily. 30 capsule 0   calcium carbonate (CALCIUM 600) 600 MG TABS tablet Take 1 tablet (600 mg total) by mouth daily with breakfast. 3 tablet    cariprazine (VRAYLAR) capsule Take 3 mg by mouth daily.     Cholecalciferol (VITAMIN D) 50 MCG (2000 UT) CAPS Take 1 capsule (2,000 Units total) by mouth daily. (Patient taking differently: Take 2,000 Units by mouth daily.)     esomeprazole (NEXIUM) 40 MG  capsule Take 1 capsule (40 mg total) by mouth daily. 60 capsule 1   Multiple Vitamin (MULTIVITAMIN WITH MINERALS) TABS tablet Take 1 tablet by mouth daily.     albuterol (VENTOLIN HFA) 108 (90 Base) MCG/ACT inhaler INHALE 2 PUFFS BY MOUTH EVERY 6 HOURS AS NEEDED FOR WHEEZING OR SHORTNESS OF BREATH (Patient taking differently: Inhale 2 puffs into the lungs every 4 (four) hours as needed for wheezing. ) 54 g 0   cholestyramine light (PREVALITE) 4 g  packet Take 0.5 packets (2 g total) by mouth 2 (two) times daily. (Patient not taking: Reported on 05/02/2020) 30 packet 3   esomeprazole (NEXIUM) 40 MG packet Take 40 mg by mouth 2 (two) times daily at 8 am and 10 pm. (Patient not taking: Reported on 05/02/2020) 60 each 11   oxyCODONE (OXY IR/ROXICODONE) 5 MG immediate release tablet Take 1 tablet (5 mg total) by mouth 3 (three) times daily as needed for severe pain or breakthrough pain. (Patient not taking: Reported on 05/02/2020) 12 tablet 0   QUEtiapine (SEROQUEL) 200 MG tablet Take 4 tablets (800 mg total) by mouth at bedtime. 360 tablet 1   sucralfate (CARAFATE) 1 g tablet TAKE 1 TABLET(1 GRAM) BY MOUTH FOUR TIMES DAILY (Patient not taking: Reported on 09/11/2020) 120 tablet 5   Tiotropium Bromide Monohydrate (SPIRIVA RESPIMAT) 2.5 MCG/ACT AERS Inhale 1 puff into the lungs daily. (Patient not taking: No sig reported) 4 g 6   No current facility-administered medications for this visit.    Medication Side Effects: None  Allergies:  Allergies  Allergen Reactions   Prozac [Fluoxetine Hcl] Swelling and Rash   Doxycycline Nausea And Vomiting   Celexa [Citalopram Hydrobromide] Other (See Comments)    Possible allergic reaction   Chantix [Varenicline Tartrate] Swelling    Mouth swelling   Hydroxyzine Other (See Comments)    Not effective   Klonopin [Clonazepam] Other (See Comments)    Caused instablility. Not able to drive due to over medicated   Lunesta [Eszopiclone] Other (See Comments)    Not effective   Sonata [Zaleplon] Other (See Comments)    Not effective   Trazodone And Nefazodone Other (See Comments)    ineffective   Zolpimist [Zolpidem Tartrate] Other (See Comments)    Not effective   Azithromycin Other (See Comments)    Reaction not recalled by the patient    Past Medical History:  Diagnosis Date   Anemia    Anxiety    Barrett esophagus    Bipolar 1 disorder ([place])    sees Dr. Candis Schatz psych  [phone])   Closed fracture distal radius and ulna, left, initial encounter 04/20/2019   S/p ORIF 04/2019 Jeannie Fend)    COPD (chronic obstructive pulmonary disease) ([place]) 03/2013   hyperinflation by CXR   Elevated alkaline phosphatase level    Esophagitis    Hiatal hernia    History of cocaine dependence ([place]) latest 2020   undergoing detox   History of stomach ulcers    Hx: UTI (urinary tract infection)    Migraine    "stopped in ~ 2008" (03/11/2017)   Muscle strain of gluteal region, right, subsequent encounter 10/05/2018   By MRI 2020   Osteoporosis 06/09/2018   DEXA 05/2017 - T score -2.5 L hip, -1.3 spine   Positive PPD 1989   s/p CXR and INH 6 mo   Seizure ([place]) X 1   "from takiing too many headache medicine?" (03/11/2017)   Smoker     Family History  Problem Relation Age of Onset   Diabetes Mother        T2DM   Breast cancer Mother 18   Dementia Mother    Lymphoma Mother    Coronary artery disease Father 72       MI   Anxiety disorder Paternal Aunt    Anxiety disorder Cousin    Depression Cousin    ADD / ADHD Child    Anxiety disorder Child    Depression Child    ADD / ADHD Son    ADD / ADHD Son    Suicidality Paternal Uncle    Depression Paternal Uncle    Stroke Neg Hx    Colon cancer Neg Hx    Cancer - Colon Neg Hx    Esophageal cancer Neg Hx    Stomach cancer Neg Hx    Rectal cancer Neg Hx     Social History   Socioeconomic History   Marital status: Divorced    Spouse name: Not on file   Number of children: 2   Years of education: bachelors   Highest education level: Not on file  Occupational History   Occupation: Product manager: [employer]    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, working on disability  Tobacco Use   Smoking status: Current Every Day Smoker    Packs/day: 1.00    Years: 36.00    Pack years: 36.00    Types: Cigarettes   Smokeless tobacco: Never Used  Scientific laboratory technician  Use: Never used  Substance and Sexual Activity   Alcohol use: Not Currently    Alcohol/week: 0.0 standard drinks    Comment: 03/11/2017 "stopped 09/18/2015; never an alcoholic"   Drug use: Not Currently    Comment: not currently   Sexual activity: Never    Birth control/protection: Surgical  Other Topics Concern   Not on file  Social History Narrative   Caffeine: 40 oz diet mountain dew/day   Lives with husband and 2 children, no pets      Social Determinants of Health   Financial Resource Strain: Not on file  Food Insecurity: Not on file  Transportation Needs: Not on file  Physical Activity: Not on file  Stress: Not on file  Social Connections: Not on file  Intimate Partner Violence: Not on file    Past Medical History, Surgical history, Social history, and Family history were reviewed and updated as appropriate.   Please see review of systems for further details on the patient's review from today.   Objective:   Physical Exam:  BP 107/79    Pulse (!) 103    Wt 125 lb (56.7 kg)    LMP 04/23/2015 (Approximate)    BMI 22.86 kg/m   Physical Exam Constitutional:      General: She is not in acute distress. Musculoskeletal:        General: No deformity.  Neurological:     Mental Status: She is alert and oriented to person, place, and time.     Coordination: Coordination normal.  Psychiatric:        Attention and Perception: Attention and perception normal. She does not perceive auditory or visual hallucinations.        Mood and Affect: Mood normal. Mood is not anxious or depressed. Affect is not labile, blunt, angry or inappropriate.        Speech: Speech normal.        Behavior: Behavior normal.  Thought Content: Thought content normal. Thought content is not paranoid or delusional. Thought content does not include homicidal or suicidal ideation. Thought content does not include homicidal or suicidal plan.        Cognition and Memory: Cognition and memory  normal.        Judgment: Judgment normal.     Comments: Insight intact     Lab Review:     Component Value Date/Time   NA 121 (LL) 04/03/2020 1006   K 3.6 04/03/2020 1006   CL 84 (L) 04/03/2020 1006   CO2 29 04/03/2020 1006   GLUCOSE 93 04/03/2020 1006   BUN 12 04/03/2020 1006   CREATININE 0.56 04/03/2020 1006   CALCIUM 8.2 (L) 04/03/2020 1006   PROT 6.1 04/03/2020 1006   ALBUMIN 3.1 (L) 04/03/2020 1006   AST 14 04/03/2020 1006   ALT 11 04/03/2020 1006   ALKPHOS 133 (H) 04/03/2020 1006   BILITOT 0.4 04/03/2020 1006   GFRNONAA >60 03/18/2020 0434   GFRAA >60 03/18/2020 0434       Component Value Date/Time   WBC 15.2 (H) 04/03/2020 1006   RBC 3.58 (L) 04/03/2020 1006   HGB 11.2 (L) 04/03/2020 1006   HGB 10.8 (L) 07/11/2018 1050   HCT 33.0 (L) 04/03/2020 1006   PLT 823.0 (H) 04/03/2020 1006   PLT 547 (H) 07/11/2018 1050   MCV 92.2 04/03/2020 1006   MCH 32.3 03/18/2020 0434   MCHC 34.0 04/03/2020 1006   RDW 14.2 04/03/2020 1006   LYMPHSABS 1.0 04/03/2020 1006   MONOABS 0.7 04/03/2020 1006   EOSABS 0.3 04/03/2020 1006   BASOSABS 0.2 (H) 04/03/2020 1006    Lithium Lvl  Date Value Ref Range Status  12/27/2010 1.43 (H) 0.80 - 1.40 mEq/L Final     No results found for: PHENYTOIN, PHENOBARB, VALPROATE, CBMZ   .res Assessment: Plan:   Pt seen for 30 minutes and time spent counseling pt regarding possible treatment options.  Discussed concern about stimulant use due to a history of cocaine dependence. Discussed that she seems to be doing well currently on Adderall XR 30 mg daily and we will therefore prescribe Adderall XR 30 mg daily for ADHD with close monitoring.  Discussed that Adderall would no longer be prescribed if there are any signs of misuse or attempts to fill early.  Discussed random urine drug screens may be ordered. Pt verbalizes understanding.  Continue Vraylar 3 mg daily for mood signs and symptoms. Continue Seroquel 800 mg at bedtime for mood  stabilization and insomnia. Recommend continuing to see Sammuel Cooper, LCSW for therapy. Patient to follow-up in 3 months or sooner if clinically indicated. Patient advised to contact office with any questions, adverse effects, or acute worsening in signs and symptoms.    Yeilani was seen today for adhd and follow-up.  Diagnoses and all orders for this visit:  Attention deficit hyperactivity disorder (ADHD), combined type -     amphetamine-dextroamphetamine (ADDERALL XR) 30 MG 24 hr capsule; Take 1 capsule (30 mg total) by mouth daily. -     amphetamine-dextroamphetamine (ADDERALL XR) 30 MG 24 hr capsule; Take 1 capsule (30 mg total) by mouth daily. -     amphetamine-dextroamphetamine (ADDERALL XR) 30 MG 24 hr capsule; Take 1 capsule (30 mg total) by mouth daily.  Insomnia due to mental disorder -     QUEtiapine (SEROQUEL) 200 MG tablet; Take 4 tablets (800 mg total) by mouth at bedtime.  Bipolar I disorder ([place]) -  QUEtiapine (SEROQUEL) 200 MG tablet; Take 4 tablets (800 mg total) by mouth at bedtime.     Please see After Visit Summary for patient specific instructions.  Future Appointments  Date Time Provider [place]  12/10/2020  1:00 PM Thayer Headings, PMHNP CP-CP None    No orders of the defined types were placed in this encounter.   -------------------------------

## 2020-09-11 NOTE — Telephone Encounter (Signed)
F/u on Prolia pts that have not received prolia on scheduled time or not started new script. Unsure if PCP wants to proceed with prolia script

## 2020-09-19 ENCOUNTER — Telehealth: Payer: Self-pay

## 2020-09-19 NOTE — Telephone Encounter (Signed)
Prior Authorization submitted and approved for AMPHETAMINE-DEXTROAMPHETAMINE 30 MG effective 08/31/2020-08/30/2021 with [facility] PA# [id number] BW#[id number]

## 2020-09-23 ENCOUNTER — Telehealth: Payer: Self-pay | Admitting: Psychiatry

## 2020-09-23 NOTE — Telephone Encounter (Signed)
Pt called reporting med you sent in for ADHD is not working. Pt ask if it was the Adderall and she said no.   Apt 4/12. Pharmacy [facility]

## 2020-09-23 NOTE — Telephone Encounter (Signed)
Pt was called and left message on v-mail to return call to office with more details about ADHD med not working.

## 2020-09-24 NOTE — Telephone Encounter (Signed)
Pt returned call about ADHD meds. Pt reported generic Adderall XR 30 MG was working and now she's not motivated or focused. Not sure if she needs to increase or change meds. Mentioned she has taken Concerta 40 mg in passed and it worked. Contact # [phone].

## 2020-09-24 NOTE — Telephone Encounter (Signed)
I spoke with [name] and relayed to her what you had said and she said she understood what I informed her about the Adderall.

## 2020-09-26 NOTE — Telephone Encounter (Signed)
Noted thank you Mickel Baas

## 2020-10-09 ENCOUNTER — Telehealth: Payer: Self-pay | Admitting: Psychiatry

## 2020-10-09 NOTE — Telephone Encounter (Signed)
Robin Welch, please see addendum to the first message on [place].

## 2020-10-09 NOTE — Telephone Encounter (Signed)
Addendum to 10/09/20 11:51 am note. Robin Welch called back and needs her amphetamine salts called in to [facility] on [address] instead of [facility]

## 2020-10-09 NOTE — Telephone Encounter (Signed)
Noted will clarify with her in the morning

## 2020-10-09 NOTE — Telephone Encounter (Signed)
Robin Welch called to report that the Adderall XR that you prescribed last month was different than before and that it did not help her at all.  She said she needed the dextroamphetamine Salts.  She may confused on what she has, but mostly she just said that what she took last did not work.  Please send in a corrected prescription to [facility] Or call her to explain

## 2020-10-09 NOTE — Telephone Encounter (Signed)
Dr. Clovis Pu Sorry, this was sent to you by mistake.

## 2020-10-10 ENCOUNTER — Telehealth: Payer: Self-pay | Admitting: Psychiatry

## 2020-10-10 DIAGNOSIS — F902 Attention-deficit hyperactivity disorder, combined type: Secondary | ICD-10-CM

## 2020-10-10 MED ORDER — AMPHETAMINE-DEXTROAMPHET ER 30 MG PO CP24: 30.0000 mg | ORAL_CAPSULE | Freq: Every day | ORAL | 0 refills | Status: DC

## 2020-10-10 NOTE — Telephone Encounter (Signed)
Left patient voicemail with information and to call back with which [facility] she's requesting.

## 2020-10-10 NOTE — Telephone Encounter (Signed)
Another call about her medication

## 2020-10-10 NOTE — Telephone Encounter (Signed)
Rtc she reports the bottle she picked up is Amphetamine-dextroamphetamine ER 30 mg but she is requesting Amphetamine Salts XR 30 mg. She reports it's what her son takes ( she read the bottle for me) and she doesn't want the "dextroamphetamine"   She is changing pharmacy to [facility].

## 2020-10-10 NOTE — Telephone Encounter (Signed)
Rtc to son and he read the mfg: [employer]inckrodt

## 2020-10-10 NOTE — Telephone Encounter (Signed)
Robin Welch's son called to give you some info that you needed. It is MSG D-Amphetamine ER 30 mg. Salt Combo. I had her son repeat it.

## 2020-10-10 NOTE — Telephone Encounter (Signed)
sent 

## 2020-10-10 NOTE — Telephone Encounter (Signed)
[facility] on [address] 

## 2020-10-11 ENCOUNTER — Telehealth: Payer: Self-pay | Admitting: Psychiatry

## 2020-10-11 NOTE — Telephone Encounter (Signed)
Please refer to other note (Multiple telephone notes re: same issue)

## 2020-10-11 NOTE — Telephone Encounter (Signed)
Script already sent to [facility] on [address].

## 2020-10-11 NOTE — Telephone Encounter (Signed)
Pt called and said that [facility] doesn't carry adderall and all of her scripts need to be cancelled and sent to [facility] on [address]

## 2020-10-11 NOTE — Telephone Encounter (Signed)
This was sent to me by mistake.

## 2020-10-14 ENCOUNTER — Other Ambulatory Visit: Payer: Self-pay

## 2020-10-14 ENCOUNTER — Telehealth: Payer: Self-pay | Admitting: Psychiatry

## 2020-10-14 DIAGNOSIS — F902 Attention-deficit hyperactivity disorder, combined type: Secondary | ICD-10-CM

## 2020-10-14 MED ORDER — AMPHETAMINE-DEXTROAMPHET ER 30 MG PO CP24: 30.0000 mg | ORAL_CAPSULE | Freq: Every day | ORAL | 0 refills | Status: DC

## 2020-10-14 NOTE — Telephone Encounter (Signed)
Pt called to advise [facility] can not get Adderall. Pt had requested that pharmacy. Now Pt needs all Adderall Rx canc at [facility] and send to [facility] on [address]. ASAP. Pt called 3 times today.

## 2020-10-29 HISTORY — PX: ESOPHAGOGASTRODUODENOSCOPY: SHX1529

## 2020-11-05 DIAGNOSIS — S52531K Colles' fracture of right radius, subsequent encounter for closed fracture with nonunion: Secondary | ICD-10-CM | POA: Diagnosis not present

## 2020-11-05 DIAGNOSIS — M25511 Pain in right shoulder: Secondary | ICD-10-CM | POA: Diagnosis not present

## 2020-11-06 DIAGNOSIS — K449 Diaphragmatic hernia without obstruction or gangrene: Secondary | ICD-10-CM | POA: Diagnosis not present

## 2020-11-06 DIAGNOSIS — Z79899 Other long term (current) drug therapy: Secondary | ICD-10-CM | POA: Diagnosis not present

## 2020-11-06 DIAGNOSIS — K222 Esophageal obstruction: Secondary | ICD-10-CM | POA: Diagnosis not present

## 2020-11-06 DIAGNOSIS — K2100 Gastro-esophageal reflux disease with esophagitis, without bleeding: Secondary | ICD-10-CM | POA: Diagnosis not present

## 2020-11-06 DIAGNOSIS — J449 Chronic obstructive pulmonary disease, unspecified: Secondary | ICD-10-CM | POA: Diagnosis not present

## 2020-11-10 ENCOUNTER — Encounter: Payer: Self-pay | Admitting: Family Medicine

## 2020-11-11 ENCOUNTER — Telehealth: Payer: Self-pay | Admitting: Psychiatry

## 2020-11-11 DIAGNOSIS — F902 Attention-deficit hyperactivity disorder, combined type: Secondary | ICD-10-CM

## 2020-11-11 MED ORDER — AMPHETAMINE-DEXTROAMPHET ER 30 MG PO CP24: 30.0000 mg | ORAL_CAPSULE | Freq: Every day | ORAL | 0 refills | Status: DC

## 2020-11-11 NOTE — Telephone Encounter (Signed)
PDMP reviewed. Last Adderall XR fill was 2/14. Will change script on file to [place].

## 2020-11-11 NOTE — Telephone Encounter (Signed)
Pt would like a refill on Adderall. Please send to [facility] on [address].

## 2020-12-10 ENCOUNTER — Other Ambulatory Visit: Payer: Self-pay

## 2020-12-10 ENCOUNTER — Telehealth: Payer: Self-pay | Admitting: Psychiatry

## 2020-12-10 ENCOUNTER — Ambulatory Visit: Payer: Medicare PPO | Admitting: Psychiatry

## 2020-12-10 DIAGNOSIS — F902 Attention-deficit hyperactivity disorder, combined type: Secondary | ICD-10-CM

## 2020-12-10 MED ORDER — AMPHETAMINE-DEXTROAMPHET ER 30 MG PO CP24: 30.0000 mg | ORAL_CAPSULE | Freq: Every day | ORAL | 0 refills | Status: DC

## 2020-12-10 NOTE — Telephone Encounter (Signed)
Last refill 3/14 Pended for Janett Billow to send

## 2020-12-10 NOTE — Telephone Encounter (Signed)
Pt called requesting Rx for Adderall XR 30 mg @ [place]. Apt 5/6

## 2020-12-11 DIAGNOSIS — N3001 Acute cystitis with hematuria: Secondary | ICD-10-CM | POA: Diagnosis not present

## 2020-12-11 DIAGNOSIS — R300 Dysuria: Secondary | ICD-10-CM | POA: Diagnosis not present

## 2020-12-13 ENCOUNTER — Telehealth: Payer: Self-pay | Admitting: Physician Assistant

## 2020-12-13 MED ORDER — QUETIAPINE FUMARATE 200 MG PO TABS
800.0000 mg | ORAL_TABLET | Freq: Every day | ORAL | 0 refills | Status: DC
Start: 2020-12-13 — End: 2021-04-03

## 2020-12-13 NOTE — Telephone Encounter (Signed)
Pt called, on-call provider. Out of town and forgot to take seroquel with her. Requests a 2 day supply. Rx sent to [facility] [address], [place]

## 2020-12-20 ENCOUNTER — Telehealth: Payer: Self-pay | Admitting: Psychiatry

## 2020-12-20 DIAGNOSIS — F319 Bipolar disorder, unspecified: Secondary | ICD-10-CM

## 2020-12-20 DIAGNOSIS — F5105 Insomnia due to other mental disorder: Secondary | ICD-10-CM

## 2020-12-20 MED ORDER — QUETIAPINE FUMARATE 200 MG PO TABS: 800.0000 mg | ORAL_TABLET | Freq: Every day | ORAL | 1 refills | Status: DC

## 2020-12-20 NOTE — Telephone Encounter (Signed)
Pharmacy called asking for a refill on her seroquel 200 mg to be sent to them. Said they sent a request on 12/13/20 and haven't heard back

## 2020-12-20 NOTE — Telephone Encounter (Signed)
Please review.a Rx was sent on 4/15 for #8 pills

## 2020-12-20 NOTE — Telephone Encounter (Signed)
Script sent to [facility]

## 2021-01-03 ENCOUNTER — Ambulatory Visit: Payer: Medicare HMO | Admitting: Psychiatry

## 2021-01-08 ENCOUNTER — Other Ambulatory Visit: Payer: Self-pay

## 2021-01-08 ENCOUNTER — Telehealth: Payer: Self-pay | Admitting: Psychiatry

## 2021-01-08 DIAGNOSIS — F902 Attention-deficit hyperactivity disorder, combined type: Secondary | ICD-10-CM

## 2021-01-08 MED ORDER — AMPHETAMINE-DEXTROAMPHET ER 30 MG PO CP24: 30.0000 mg | ORAL_CAPSULE | Freq: Every day | ORAL | 0 refills | Status: DC

## 2021-01-08 NOTE — Telephone Encounter (Signed)
She is overdue for a appt please schedule her one.I will pend 1 month rx for now

## 2021-01-08 NOTE — Telephone Encounter (Signed)
Pt left a message that she needs a refill on her adderall xr 30 mg to be [facility] on [address] and [address]

## 2021-01-08 NOTE — Telephone Encounter (Signed)
Last filled 4/12. pended

## 2021-01-08 NOTE — Telephone Encounter (Signed)
Please schedule follow up

## 2021-01-09 ENCOUNTER — Ambulatory Visit: Payer: Medicare HMO | Admitting: Psychiatry

## 2021-01-09 ENCOUNTER — Other Ambulatory Visit: Payer: Self-pay

## 2021-01-09 ENCOUNTER — Telehealth: Payer: Self-pay | Admitting: Psychiatry

## 2021-01-09 DIAGNOSIS — F902 Attention-deficit hyperactivity disorder, combined type: Secondary | ICD-10-CM

## 2021-01-09 MED ORDER — AMPHETAMINE-DEXTROAMPHET ER 30 MG PO CP24: 30.0000 mg | ORAL_CAPSULE | Freq: Every day | ORAL | 0 refills | Status: DC

## 2021-01-09 NOTE — Telephone Encounter (Signed)
I already did

## 2021-01-09 NOTE — Telephone Encounter (Signed)
Sent!

## 2021-01-09 NOTE — Telephone Encounter (Signed)
Next visit is 02/19/21. [place] RX was sent to the wrong pharmacy. It was sent to:  [id number] [place], [place] - [address] Phone:  [phone]  Fax:  [phone]     It needs to be sent to  [facility], [address] AT [address] Phone:  [phone]  Fax:  [phone]

## 2021-01-09 NOTE — Telephone Encounter (Signed)
Rx cancelled and pended to correct pharmacy

## 2021-01-09 NOTE — Telephone Encounter (Signed)
Please call pharmacy to cancel script sent to [facility].

## 2021-01-16 NOTE — Telephone Encounter (Signed)
Can you refuse this please it won't let me

## 2021-01-18 DIAGNOSIS — H6122 Impacted cerumen, left ear: Secondary | ICD-10-CM | POA: Diagnosis not present

## 2021-01-18 DIAGNOSIS — H6123 Impacted cerumen, bilateral: Secondary | ICD-10-CM | POA: Diagnosis not present

## 2021-01-21 ENCOUNTER — Telehealth: Payer: Self-pay | Admitting: Psychiatry

## 2021-01-21 ENCOUNTER — Other Ambulatory Visit: Payer: Self-pay

## 2021-01-21 NOTE — Telephone Encounter (Signed)
Pt would like a refill on vraylar. Pt may need samples until it arrives.

## 2021-01-21 NOTE — Telephone Encounter (Signed)
Please review

## 2021-01-21 NOTE — Telephone Encounter (Signed)
I called the pt to find out the pharmacy and she let me know that there is a certain way we have to send it that [place] knows all about.Robin Welch can you help with this?

## 2021-01-21 NOTE — Telephone Encounter (Signed)
Ok to provide samples and approve refill

## 2021-01-21 NOTE — Telephone Encounter (Signed)
I think she gets it through pt assistance

## 2021-01-22 NOTE — Telephone Encounter (Signed)
[place] does get her vraylar through Pt. Assistance.  The chart does need to be updated to show it is approved through 08/30/21.  I have requested a refill.  It will arrive 7-10 business days.  Please note that it has been ordered.  She has two refills remaining.

## 2021-01-24 NOTE — Telephone Encounter (Signed)
Please see previous messages

## 2021-02-07 ENCOUNTER — Other Ambulatory Visit: Payer: Self-pay

## 2021-02-07 ENCOUNTER — Telehealth: Payer: Self-pay | Admitting: Psychiatry

## 2021-02-07 DIAGNOSIS — F902 Attention-deficit hyperactivity disorder, combined type: Secondary | ICD-10-CM

## 2021-02-07 MED ORDER — AMPHETAMINE-DEXTROAMPHET ER 30 MG PO CP24: 30.0000 mg | ORAL_CAPSULE | Freq: Every day | ORAL | 0 refills | Status: DC

## 2021-02-07 NOTE — Telephone Encounter (Signed)
Pt called and asked for a refill on her adderall xr 30 mg to be sent to the [facility] on [address]

## 2021-02-07 NOTE — Telephone Encounter (Signed)
pended

## 2021-02-13 ENCOUNTER — Ambulatory Visit: Payer: Medicare HMO | Admitting: Psychiatry

## 2021-02-19 ENCOUNTER — Ambulatory Visit: Payer: Medicare HMO | Admitting: Psychiatry

## 2021-02-24 ENCOUNTER — Ambulatory Visit: Payer: Medicare HMO | Admitting: Psychiatry

## 2021-02-28 DIAGNOSIS — H5203 Hypermetropia, bilateral: Secondary | ICD-10-CM | POA: Diagnosis not present

## 2021-02-28 DIAGNOSIS — H40033 Anatomical narrow angle, bilateral: Secondary | ICD-10-CM | POA: Diagnosis not present

## 2021-02-28 DIAGNOSIS — H524 Presbyopia: Secondary | ICD-10-CM | POA: Diagnosis not present

## 2021-02-28 DIAGNOSIS — H2513 Age-related nuclear cataract, bilateral: Secondary | ICD-10-CM | POA: Diagnosis not present

## 2021-03-10 ENCOUNTER — Other Ambulatory Visit: Payer: Self-pay

## 2021-03-10 ENCOUNTER — Telehealth: Payer: Self-pay | Admitting: Psychiatry

## 2021-03-10 DIAGNOSIS — F902 Attention-deficit hyperactivity disorder, combined type: Secondary | ICD-10-CM

## 2021-03-10 MED ORDER — AMPHETAMINE-DEXTROAMPHET ER 30 MG PO CP24: 30.0000 mg | ORAL_CAPSULE | Freq: Every day | ORAL | 0 refills | Status: DC

## 2021-03-10 NOTE — Telephone Encounter (Signed)
Pt called and said that she needs a refill on her adderall xr 30 mg to be [facility] the [facility] on [address]

## 2021-03-10 NOTE — Telephone Encounter (Signed)
Pt stated she needs her son to pick up her vraylar while he is in the office today just FYI if he asks front office staff

## 2021-04-03 ENCOUNTER — Other Ambulatory Visit: Payer: Self-pay

## 2021-04-03 ENCOUNTER — Ambulatory Visit (INDEPENDENT_AMBULATORY_CARE_PROVIDER_SITE_OTHER): Payer: Medicare HMO | Admitting: Psychiatry

## 2021-04-03 ENCOUNTER — Encounter: Payer: Self-pay | Admitting: Psychiatry

## 2021-04-03 DIAGNOSIS — F902 Attention-deficit hyperactivity disorder, combined type: Secondary | ICD-10-CM

## 2021-04-03 DIAGNOSIS — F5105 Insomnia due to other mental disorder: Secondary | ICD-10-CM | POA: Diagnosis not present

## 2021-04-03 DIAGNOSIS — F319 Bipolar disorder, unspecified: Secondary | ICD-10-CM | POA: Diagnosis not present

## 2021-04-03 MED ORDER — AMPHETAMINE-DEXTROAMPHET ER 30 MG PO CP24: 30.0000 mg | ORAL_CAPSULE | Freq: Every day | ORAL | 0 refills | Status: DC

## 2021-04-03 MED ORDER — QUETIAPINE FUMARATE 200 MG PO TABS: 800.0000 mg | ORAL_TABLET | Freq: Every day | ORAL | 1 refills | Status: DC

## 2021-04-03 NOTE — Progress Notes (Signed)
Robin Welch [id number] [birth year] 55 y.o.  Subjective:   Patient ID:  Robin Welch is a 55 y.o. (DOB 1966-05-21) female.  Chief Complaint:  Chief Complaint  Patient presents with   Follow-up    Mood disturbance, anxiety, ADHD, and insomnia    HPI Robin Welch presents to the office today for follow-up of anxiety, mood disturbance, ADHD, and insomnia. She reports, "I am doing the best I have ever done." She reports that her family and friends have commented on the improvements she has made. Denies any recent anxiety. She notices that she is more interested in things. Her motivation has also improved. She reports that she has been working on getting her home clean and organized. She reports that she is now able to start and complete tasks. Has been able to read books for the first time in years. Reports that her concentration has been good. "The Arman Filter is working perfect" and reports that she feels more alert after taking it. Mood has been stable. Sleeping well and at least 8 hours and sometimes closer to 10 hours. Denies elevated mood. Denies impulsive or risky behaviors. Denies substance use or cravings. Estimates gaining 20-25 lbs since last visit. Appetite has been good. Denies SI.  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 She has been working at [employer] over the summer. Continues to be active in [place].   Eating healthier.   Went on family vacation.   Past Psychiatric Medication Trials: Xanax- Misused Klonopin Seroquel- Helpful for sleep and anxiety. Olanzapine Geodon Abilify Lithium Lamictal Depakote Buspar Hydroxyzine Gabapentin Prozac- Hives, edema Celexa- Possible allergic reaction Paxil Wellbutrin XL Sonata Ambien Zolpimist Lunesta Trazodone  AIMS    Flowsheet Row Office Visit from 04/03/2021 in [place] Visit from 09/11/2020 in [place] Office Visit from 11/20/2019 in [place]  Visit from 10/13/2019 in [place] Total Score 0 1 0 0      [id number]    [place] Office Visit from 06/21/2017 in [place] at [facility] Visit from 12/07/2014 in [place] at [facility] Total Score 0 2        Review of Systems:  Review of Systems  Gastrointestinal:  Negative for constipation.  Musculoskeletal:  Negative for gait problem.  Neurological:  Negative for tremors.  Psychiatric/Behavioral:         Please refer to HPI   Medications: I have reviewed the patient's current medications.  Current Outpatient Medications  Medication Sig Dispense Refill   calcium carbonate (CALCIUM 600) 600 MG TABS tablet Take 1 tablet (600 mg total) by mouth daily with breakfast. 3 tablet    cariprazine (VRAYLAR) capsule Take 3 mg by mouth daily.     Cholecalciferol (VITAMIN D) 50 MCG (2000 UT) CAPS Take 1 capsule (2,000 Units total) by mouth daily. (Patient taking differently: Take 2,000 Units by mouth daily.)     esomeprazole (NEXIUM) 40 MG capsule Take 1 capsule (40 mg total) by mouth daily. 60 capsule 1   Multiple Vitamin (MULTIVITAMIN WITH MINERALS) TABS tablet Take 1 tablet by mouth daily.     albuterol (VENTOLIN HFA) 108 (90 Base) MCG/ACT inhaler INHALE 2 PUFFS BY MOUTH EVERY 6 HOURS AS NEEDED FOR WHEEZING OR SHORTNESS OF BREATH (Patient taking differently: Inhale 2 puffs into the lungs every 4 (four) hours as needed for wheezing. ) 54 g 0   [START ON 06/02/2021] amphetamine-dextroamphetamine (ADDERALL XR) 30 MG 24 hr capsule Take  1 capsule (30 mg total) by mouth daily. 30 capsule 0   [START ON 05/05/2021] amphetamine-dextroamphetamine (ADDERALL XR) 30 MG 24 hr capsule Take 1 capsule (30 mg total) by mouth daily. 30 capsule 0   [START ON 04/07/2021] amphetamine-dextroamphetamine (ADDERALL XR) 30 MG 24 hr capsule Take 1 capsule (30 mg total) by mouth daily. 30 capsule 0   cholestyramine light (PREVALITE) 4 g packet Take 0.5 packets (2 g  total) by mouth 2 (two) times daily. (Patient not taking: No sig reported) 30 packet 3   esomeprazole (NEXIUM) 40 MG packet Take 40 mg by mouth 2 (two) times daily at 8 am and 10 pm. (Patient not taking: Reported on 05/02/2020) 60 each 11   oxyCODONE (OXY IR/ROXICODONE) 5 MG immediate release tablet Take 1 tablet (5 mg total) by mouth 3 (three) times daily as needed for severe pain or breakthrough pain. (Patient not taking: No sig reported) 12 tablet 0   QUEtiapine (SEROQUEL) 200 MG tablet Take 4 tablets (800 mg total) by mouth at bedtime. 360 tablet 1   sucralfate (CARAFATE) 1 g tablet TAKE 1 TABLET(1 GRAM) BY MOUTH FOUR TIMES DAILY (Patient not taking: Reported on 09/11/2020) 120 tablet 5   Tiotropium Bromide Monohydrate (SPIRIVA RESPIMAT) 2.5 MCG/ACT AERS Inhale 1 puff into the lungs daily. (Patient not taking: No sig reported) 4 g 6   No current facility-administered medications for this visit.    Medication Side Effects: None  Allergies:  Allergies  Allergen Reactions   Prozac [Fluoxetine Hcl] Swelling and Rash   Doxycycline Nausea And Vomiting   Celexa [Citalopram Hydrobromide] Other (See Comments)    Possible allergic reaction   Chantix [Varenicline Tartrate] Swelling    Mouth swelling   Hydroxyzine Other (See Comments)    Not effective   Klonopin [Clonazepam] Other (See Comments)    Caused instablility. Not able to drive due to over medicated   Lunesta [Eszopiclone] Other (See Comments)    Not effective   Sonata [Zaleplon] Other (See Comments)    Not effective   Trazodone And Nefazodone Other (See Comments)    ineffective   Zolpimist [Zolpidem Tartrate] Other (See Comments)    Not effective   Azithromycin Other (See Comments)    Reaction not recalled by the patient    Past Medical History:  Diagnosis Date   Anemia    Anxiety    Barrett esophagus    Bipolar 1 disorder ([place])    sees Dr. Candis Schatz psych [phone])   Closed fracture distal radius and ulna, left, initial  encounter 04/20/2019   S/p ORIF 04/2019 Jeannie Fend)    COPD (chronic obstructive pulmonary disease) ([place]) 03/2013   hyperinflation by CXR   Elevated alkaline phosphatase level    Esophagitis    Hiatal hernia    History of cocaine dependence ([place]) latest 2020   undergoing detox   History of stomach ulcers    Hx: UTI (urinary tract infection)    Migraine    "stopped in ~ 2008" (03/11/2017)   Muscle strain of gluteal region, right, subsequent encounter 10/05/2018   By MRI 2020   Osteoporosis 06/09/2018   DEXA 05/2017 - T score -2.5 L hip, -1.3 spine   Positive PPD 1989   s/p CXR and INH 6 mo   Seizure ([place]) X 1   "from takiing too many headache medicine?" (03/11/2017)   Smoker     Past Medical History, Surgical history, Social history, and Family history were reviewed and updated as appropriate.   Please  see review of systems for further details on the patient's review from today.   Objective:   Physical Exam:  BP 118/76   Pulse 99   Wt 126 lb (57.2 kg)   LMP 04/23/2015 (Approximate)   BMI 23.05 kg/m   Physical Exam Constitutional:      General: She is not in acute distress. Musculoskeletal:        General: No deformity.  Neurological:     Mental Status: She is alert and oriented to person, place, and time.     Coordination: Coordination normal.  Psychiatric:        Attention and Perception: Attention and perception normal. She does not perceive auditory or visual hallucinations.        Mood and Affect: Mood normal. Mood is not anxious or depressed. Affect is not labile, blunt, angry or inappropriate.        Speech: Speech normal.        Behavior: Behavior normal.        Thought Content: Thought content normal. Thought content is not paranoid or delusional. Thought content does not include homicidal or suicidal ideation. Thought content does not include homicidal or suicidal plan.        Cognition and Memory: Cognition and memory normal.        Judgment: Judgment normal.      Comments: Insight intact    Lab Review:     Component Value Date/Time   NA 121 (LL) 04/03/2020 1006   K 3.6 04/03/2020 1006   CL 84 (L) 04/03/2020 1006   CO2 29 04/03/2020 1006   GLUCOSE 93 04/03/2020 1006   BUN 12 04/03/2020 1006   CREATININE 0.56 04/03/2020 1006   CALCIUM 8.2 (L) 04/03/2020 1006   PROT 6.1 04/03/2020 1006   ALBUMIN 3.1 (L) 04/03/2020 1006   AST 14 04/03/2020 1006   ALT 11 04/03/2020 1006   ALKPHOS 133 (H) 04/03/2020 1006   BILITOT 0.4 04/03/2020 1006   GFRNONAA >60 03/18/2020 0434   GFRAA >60 03/18/2020 0434       Component Value Date/Time   WBC 15.2 (H) 04/03/2020 1006   RBC 3.58 (L) 04/03/2020 1006   HGB 11.2 (L) 04/03/2020 1006   HGB 10.8 (L) 07/11/2018 1050   HCT 33.0 (L) 04/03/2020 1006   PLT 823.0 (H) 04/03/2020 1006   PLT 547 (H) 07/11/2018 1050   MCV 92.2 04/03/2020 1006   MCH 32.3 03/18/2020 0434   MCHC 34.0 04/03/2020 1006   RDW 14.2 04/03/2020 1006   LYMPHSABS 1.0 04/03/2020 1006   MONOABS 0.7 04/03/2020 1006   EOSABS 0.3 04/03/2020 1006   BASOSABS 0.2 (H) 04/03/2020 1006    Lithium Lvl  Date Value Ref Range Status  12/27/2010 1.43 (H) 0.80 - 1.40 mEq/L Final     No results found for: PHENYTOIN, PHENOBARB, VALPROATE, CBMZ   .res Assessment: Plan:   Will continue current plan of care since target signs and symptoms are well controlled without any tolerability issues. Continue Vraylar 3 mg po qd through Patient Assistance for mood s/s.  Continue Seroquel 800 mg po QHS for mood s/s and insomnia.  Continue Adderall XR 30 mg po qd for ADHD.  Pt to follow-up in 3 months or sooner if clinically indicated.  Patient advised to contact office with any questions, adverse effects, or acute worsening in signs and symptoms.   Robin Welch was seen today for follow-up.  Diagnoses and all orders for this visit:  Attention deficit hyperactivity disorder (ADHD), combined  type -     amphetamine-dextroamphetamine (ADDERALL XR) 30 MG 24 hr capsule;  Take 1 capsule (30 mg total) by mouth daily. -     amphetamine-dextroamphetamine (ADDERALL XR) 30 MG 24 hr capsule; Take 1 capsule (30 mg total) by mouth daily. -     amphetamine-dextroamphetamine (ADDERALL XR) 30 MG 24 hr capsule; Take 1 capsule (30 mg total) by mouth daily.  Insomnia due to mental disorder -     QUEtiapine (SEROQUEL) 200 MG tablet; Take 4 tablets (800 mg total) by mouth at bedtime.  Bipolar I disorder (HCC) -     QUEtiapine (SEROQUEL) 200 MG tablet; Take 4 tablets (800 mg total) by mouth at bedtime.    Please see After Visit Summary for patient specific instructions.  Future Appointments  Date Time Provider [place]  07/04/2021 10:00 AM Thayer Headings, PMHNP CP-CP None    No orders of the defined types were placed in this encounter.   -------------------------------

## 2021-04-03 NOTE — Progress Notes (Signed)
   04/03/21 0925  Facial and Oral Movements  Muscles of Facial Expression 0  Lips and Perioral Area 0  Jaw 0  Tongue 0  Extremity Movements  Upper (arms, wrists, hands, fingers) 0  Lower (legs, knees, ankles, toes) 0  Trunk Movements  Neck, shoulders, hips 0  Overall Severity  Severity of abnormal movements (highest score from questions above) 0  Incapacitation due to abnormal movements 0  Patient's awareness of abnormal movements (rate only patient's report) 0  AIMS Total Score  AIMS Total Score 0

## 2021-04-29 ENCOUNTER — Telehealth: Payer: Self-pay | Admitting: Psychiatry

## 2021-04-29 NOTE — Telephone Encounter (Signed)
Robin Welch called to request refill of her vraylar.  The order was placed.  She will receive it 7-10 business days.  She said she will need samples to tie her over.  Samples were pulled.

## 2021-04-29 NOTE — Telephone Encounter (Signed)
FYI

## 2021-05-08 ENCOUNTER — Other Ambulatory Visit: Payer: Self-pay | Admitting: Family Medicine

## 2021-05-08 NOTE — Telephone Encounter (Signed)
E-scribed refill.  Plz schedule AWV.

## 2021-05-12 NOTE — Telephone Encounter (Signed)
LMTCB to schedule appt for an AWV/CPE

## 2021-05-23 ENCOUNTER — Other Ambulatory Visit: Payer: Self-pay | Admitting: Family Medicine

## 2021-05-23 ENCOUNTER — Telehealth: Payer: Self-pay | Admitting: Family Medicine

## 2021-05-23 MED ORDER — SUCRALFATE 1 G PO TABS: 1.0000 g | ORAL_TABLET | Freq: Three times a day (TID) | ORAL | 0 refills | Status: DC

## 2021-05-23 NOTE — Addendum Note (Signed)
Addended by: Ria Bush on: 05/23/2021 02:13 PM   Modules accepted: Orders

## 2021-05-23 NOTE — Telephone Encounter (Signed)
Attempted to contact pt.  Vm box is full.  Need to relay Dr. Synthia Innocent message.

## 2021-05-23 NOTE — Telephone Encounter (Addendum)
Not sure what med she is referring to, would help to get name - does she have an old bottle? I have sent in carafate in case that's what she was referring to however this is not the best medicine for long term use given drug interactions.

## 2021-05-23 NOTE — Telephone Encounter (Signed)
Pt called stating that Dr Darnell Level called in the old medication esomeprazole ([place]) 40 MG capsule,and that one doesn't work. Pt states that she cant remember the name of the new medication but the pills are blue and [place]

## 2021-05-23 NOTE — Telephone Encounter (Signed)
Pt wants medication going to [place] AT [place].

## 2021-05-27 ENCOUNTER — Other Ambulatory Visit: Payer: Self-pay | Admitting: Family Medicine

## 2021-05-27 ENCOUNTER — Telehealth: Payer: Self-pay | Admitting: Family Medicine

## 2021-05-27 NOTE — Telephone Encounter (Signed)
Pt called in stated she need a approval form Dr. Darnell Level to have a lower GI gone  at [facility] . Would like a call back [phone]

## 2021-05-28 DIAGNOSIS — K449 Diaphragmatic hernia without obstruction or gangrene: Secondary | ICD-10-CM | POA: Diagnosis not present

## 2021-05-28 NOTE — Telephone Encounter (Signed)
Plz call to see what she needs.  Normally we receive a faxed clearance request from the specialist office.

## 2021-05-28 NOTE — Telephone Encounter (Addendum)
Attempted to contact pt.  Vm box is full.  Need to know exactly what pt needs concerning GI.

## 2021-05-28 NOTE — Telephone Encounter (Signed)
Attempted to contact pt.  Vm box is full.  Need to relay Dr. Synthia Innocent message.

## 2021-05-29 NOTE — Telephone Encounter (Signed)
Attempted to contact pt.  Vm box is full.  Need to relay Dr. Synthia Innocent message.

## 2021-05-29 NOTE — Telephone Encounter (Addendum)
She is also overdue for office visit (last seen 03/2020). If needs clearance, we cannot clear without OV as latest labs were very abnormal.

## 2021-05-29 NOTE — Telephone Encounter (Signed)
Attempted to contact pt.  Vm box is full.  Need to know exactly what pt needs concerning GI.

## 2021-06-02 NOTE — Telephone Encounter (Addendum)
Attempted to contact pt.  Vm box is full.  Need to relay Dr. Synthia Innocent message.  Mailing a letter.

## 2021-06-02 NOTE — Telephone Encounter (Signed)
Attempted to contact pt.  Vm box is full.  Need to know exactly what pt needs concerning GI.

## 2021-06-03 NOTE — Telephone Encounter (Addendum)
Attempted to contact pt.  Vm box is full.  Need to know exactly what pt needs concerning GI.  Mailing a letter.

## 2021-06-16 DIAGNOSIS — R1310 Dysphagia, unspecified: Secondary | ICD-10-CM | POA: Diagnosis not present

## 2021-06-16 DIAGNOSIS — K449 Diaphragmatic hernia without obstruction or gangrene: Secondary | ICD-10-CM | POA: Diagnosis not present

## 2021-06-16 DIAGNOSIS — Z01818 Encounter for other preprocedural examination: Secondary | ICD-10-CM | POA: Diagnosis not present

## 2021-06-16 DIAGNOSIS — K219 Gastro-esophageal reflux disease without esophagitis: Secondary | ICD-10-CM | POA: Diagnosis not present

## 2021-06-24 ENCOUNTER — Telehealth: Payer: Self-pay | Admitting: Psychiatry

## 2021-06-24 NOTE — Telephone Encounter (Signed)
Filled 10/3 due 10/31

## 2021-06-24 NOTE — Telephone Encounter (Signed)
Pt requesting Rx for Adderall XR 30 mg to [facility]. APT 12/2

## 2021-06-30 ENCOUNTER — Telehealth: Payer: Self-pay | Admitting: Psychiatry

## 2021-06-30 ENCOUNTER — Other Ambulatory Visit: Payer: Self-pay

## 2021-06-30 DIAGNOSIS — F902 Attention-deficit hyperactivity disorder, combined type: Secondary | ICD-10-CM

## 2021-06-30 NOTE — Telephone Encounter (Signed)
Pended.

## 2021-06-30 NOTE — Telephone Encounter (Signed)
Pt called requesting refill for Adderall XR 30 mg to [facility]. Last Rx was 06/02/21. Going to be out of town and asking to fill early.

## 2021-07-01 MED ORDER — AMPHETAMINE-DEXTROAMPHET ER 30 MG PO CP24: 30.0000 mg | ORAL_CAPSULE | Freq: Every day | ORAL | 0 refills | Status: DC

## 2021-07-02 ENCOUNTER — Other Ambulatory Visit: Payer: Self-pay | Admitting: Psychiatry

## 2021-07-02 DIAGNOSIS — F902 Attention-deficit hyperactivity disorder, combined type: Secondary | ICD-10-CM

## 2021-07-02 MED ORDER — AMPHETAMINE-DEXTROAMPHET ER 30 MG PO CP24: 30.0000 mg | ORAL_CAPSULE | Freq: Every day | ORAL | 0 refills | Status: DC

## 2021-07-02 NOTE — Telephone Encounter (Signed)
Pt called and said that [facility] [place] [facility]der[facility] xr in stock. She doesn't care about the manufacture. Please cancel at [facility] and resubmit

## 2021-07-02 NOTE — Telephone Encounter (Signed)
Cancelled Rx at [facility] and [facility] to [facility] per patient request.

## 2021-07-04 ENCOUNTER — Ambulatory Visit: Payer: Medicare HMO | Admitting: Psychiatry

## 2021-07-23 DIAGNOSIS — R309 Painful micturition, unspecified: Secondary | ICD-10-CM | POA: Diagnosis not present

## 2021-07-23 DIAGNOSIS — R300 Dysuria: Secondary | ICD-10-CM | POA: Diagnosis not present

## 2021-07-23 DIAGNOSIS — N3001 Acute cystitis with hematuria: Secondary | ICD-10-CM | POA: Diagnosis not present

## 2021-07-28 ENCOUNTER — Telehealth: Payer: Self-pay | Admitting: Psychiatry

## 2021-07-28 ENCOUNTER — Telehealth: Payer: Self-pay

## 2021-07-28 DIAGNOSIS — F902 Attention-deficit hyperactivity disorder, combined type: Secondary | ICD-10-CM

## 2021-07-28 MED ORDER — AMPHETAMINE-DEXTROAMPHET ER 30 MG PO CP24: 30.0000 mg | ORAL_CAPSULE | Freq: Every day | ORAL | 0 refills | Status: DC

## 2021-07-28 NOTE — Telephone Encounter (Signed)
Pt would like [facility] script [facility] to [facility] [address]. It is available there.  Next appt 1/12

## 2021-07-28 NOTE — Telephone Encounter (Signed)
Sent!

## 2021-07-28 NOTE — Telephone Encounter (Signed)
When was it last filled? 

## 2021-07-28 NOTE — Telephone Encounter (Signed)
Pended.

## 2021-07-28 NOTE — Telephone Encounter (Signed)
Sorry filled 11/2 appt on 1/12

## 2021-08-01 ENCOUNTER — Ambulatory Visit: Payer: Medicare HMO | Admitting: Psychiatry

## 2021-08-07 ENCOUNTER — Other Ambulatory Visit: Payer: Self-pay

## 2021-08-07 MED ORDER — ESOMEPRAZOLE MAGNESIUM 40 MG PO CPDR: DELAYED_RELEASE_CAPSULE | ORAL | 0 refills | Status: DC

## 2021-08-07 NOTE — Telephone Encounter (Signed)
E-scribed refill.  Plz schedule wellness, lab and cpe visits to prevent delays in future refills.

## 2021-08-19 ENCOUNTER — Telehealth: Payer: Self-pay | Admitting: Psychiatry

## 2021-08-19 NOTE — Telephone Encounter (Signed)
Robin Welch called to request that her prescription for Adderall be sent in to the pharmacy so they will have it when it is due.  She said she won't need it until the end of the month or 1st of the year, but with the holiday's and closing she just wanted to make sure she could get it when she can.  Appt 09/11/21.  [facility] [address]

## 2021-08-19 NOTE — Telephone Encounter (Signed)
Filled on 12/2 will pend on 12/27

## 2021-08-20 ENCOUNTER — Other Ambulatory Visit: Payer: Self-pay

## 2021-08-20 DIAGNOSIS — F902 Attention-deficit hyperactivity disorder, combined type: Secondary | ICD-10-CM

## 2021-08-20 NOTE — Telephone Encounter (Signed)
Pended.

## 2021-08-27 MED ORDER — AMPHETAMINE-DEXTROAMPHET ER 30 MG PO CP24: 30.0000 mg | ORAL_CAPSULE | Freq: Every day | ORAL | 0 refills | Status: DC

## 2021-09-11 ENCOUNTER — Encounter: Payer: Self-pay | Admitting: Psychiatry

## 2021-09-11 ENCOUNTER — Telehealth (INDEPENDENT_AMBULATORY_CARE_PROVIDER_SITE_OTHER): Payer: Medicare HMO | Admitting: Psychiatry

## 2021-09-11 VITALS — BP 119/84 | HR 101

## 2021-09-11 DIAGNOSIS — F319 Bipolar disorder, unspecified: Secondary | ICD-10-CM | POA: Diagnosis not present

## 2021-09-11 DIAGNOSIS — F902 Attention-deficit hyperactivity disorder, combined type: Secondary | ICD-10-CM | POA: Diagnosis not present

## 2021-09-11 DIAGNOSIS — F5105 Insomnia due to other mental disorder: Secondary | ICD-10-CM

## 2021-09-11 MED ORDER — QUETIAPINE FUMARATE 200 MG PO TABS: 800.0000 mg | ORAL_TABLET | Freq: Every day | ORAL | 1 refills | Status: DC

## 2021-09-11 MED ORDER — AMPHETAMINE-DEXTROAMPHET ER 20 MG PO CP24: 40.0000 mg | ORAL_CAPSULE | Freq: Every day | ORAL | 0 refills | Status: DC

## 2021-09-11 NOTE — Progress Notes (Signed)
Robin Welch [id number] Jan 13, 1966 56 y.o.  Virtual Visit via Video Note  I connected with pt @ on 09/11/21 at  1:30 PM EST by a video enabled telemedicine application and verified that I am speaking with the correct person using two identifiers.   I discussed the limitations of evaluation and management by telemedicine and the availability of in person appointments. The patient expressed understanding and agreed to proceed.  I discussed the assessment and treatment plan with the patient. The patient was provided an opportunity to ask questions and all were answered. The patient agreed with the plan and demonstrated an understanding of the instructions.   The patient was advised to call back or seek an in-person evaluation if the symptoms worsen or if the condition fails to improve as anticipated.  I provided 30 minutes of non-face-to-face time during this encounter.  The patient was located at home.  The provider was located at [place].   Thayer Headings, PMHNP   Subjective:   Patient ID:  Robin Welch is a 56 y.o. (DOB 22-Dec-1965) female.  Chief Complaint:  Chief Complaint  Patient presents with   Follow-up    Bipolar D/O, ADHD, anxiety, and insomnia    HPI Robin Welch presents for follow-up of mood disorder, anxiety, ADHD, and insomnia. "I'm doing great." She reports that there motivation does not seem to be as good as it was. She reports that her motivation has been chronically low. "The Arman Filter is perfect." Denies excessive spending. Bought most of her Christmas presents in advance with her employee parking. Denies any manic s/s. She reports that she is no longer having severe depression. She reports that she has only situational depression. She denies any anxiety. Energy is ok. She has been sleeping 10 hours a night. She reports that she feels better with this amount of sleep. Appetite has been good. She reports weight gain. Estimates weighing 145 lbs. Trying to eat  healthy.   She reports that her concentration is "ok." She reports that concentration is better some days than others. Denies SI.   She was working 2 part-time jobs through Christmas. She reports that working with an alternating day off seems to work best for her. She is substitute teaching some.   Has 3 children. Daughter had a baby recently. She has been helping with grandchild.   Tested positive for COVID- "sluggish and tired."   Denies any substance use other than cigarettes.   Past Psychiatric Medication Trials: Xanax- Misused Klonopin Seroquel- Helpful for sleep and anxiety. Olanzapine Geodon Abilify Lithium Lamictal Depakote Buspar Hydroxyzine Gabapentin Prozac- Hives, edema Celexa- Possible allergic reaction Paxil Wellbutrin XL Sonata Ambien Zolpimist Lunesta Trazodone  Review of Systems:  Review of Systems  Musculoskeletal:  Negative for gait problem.       Pain in shoulder  Neurological:  Negative for tremors.       Denies involuntary movements  Psychiatric/Behavioral:         Please refer to HPI   Medications: I have reviewed the patient's current medications.  Current Outpatient Medications  Medication Sig Dispense Refill   [START ON 09/26/2021] amphetamine-dextroamphetamine (ADDERALL XR) 20 MG 24 hr capsule Take 2 capsules (40 mg total) by mouth daily. 60 capsule 0   [START ON 10/24/2021] amphetamine-dextroamphetamine (ADDERALL XR) 20 MG 24 hr capsule Take 2 capsules (40 mg total) by mouth daily. 60 capsule 0   [START ON 11/21/2021] amphetamine-dextroamphetamine (ADDERALL XR) 20 MG 24 hr capsule Take 2 capsules (40 mg total) by mouth daily.  60 capsule 0   calcium carbonate (CALCIUM 600) 600 MG TABS tablet Take 1 tablet (600 mg total) by mouth daily with breakfast. 3 tablet    cariprazine (VRAYLAR) capsule Take 3 mg by mouth daily.     Cholecalciferol (VITAMIN D) 50 MCG (2000 UT) CAPS Take 1 capsule (2,000 Units total) by mouth daily. (Patient taking  differently: Take 2,000 Units by mouth daily.)     esomeprazole (NEXIUM) 40 MG capsule TAKE 1 CAPSULE(40 MG) BY MOUTH DAILY 90 capsule 0   Multiple Vitamin (MULTIVITAMIN WITH MINERALS) TABS tablet Take 1 tablet by mouth daily.     sucralfate (CARAFATE) 1 g tablet TAKE 1 TABLET(1 GRAM) BY MOUTH FOUR TIMES DAILY WITH MEALS AND AT BEDTIME AS NEEDED FOR UPSET STOMACH OR REFLUX 360 tablet 0   albuterol (VENTOLIN HFA) 108 (90 Base) MCG/ACT inhaler INHALE 2 PUFFS BY MOUTH EVERY 6 HOURS AS NEEDED FOR WHEEZING OR SHORTNESS OF BREATH (Patient not taking: Reported on 09/11/2021) 54 g 0   cholestyramine light (PREVALITE) 4 g packet Take 0.5 packets (2 g total) by mouth 2 (two) times daily. (Patient not taking: Reported on 05/02/2020) 30 packet 3   QUEtiapine (SEROQUEL) 200 MG tablet Take 4 tablets (800 mg total) by mouth at bedtime. 360 tablet 1   Tiotropium Bromide Monohydrate (SPIRIVA RESPIMAT) 2.5 MCG/ACT AERS Inhale 1 puff into the lungs daily. (Patient not taking: Reported on 05/02/2020) 4 g 6   No current facility-administered medications for this visit.    Medication Side Effects: None  Allergies:  Allergies  Allergen Reactions   Prozac [Fluoxetine Hcl] Swelling and Rash   Doxycycline Nausea And Vomiting   Celexa [Citalopram Hydrobromide] Other (See Comments)    Possible allergic reaction   Chantix [Varenicline Tartrate] Swelling    Mouth swelling   Hydroxyzine Other (See Comments)    Not effective   Klonopin [Clonazepam] Other (See Comments)    Caused instablility. Not able to drive due to over medicated   Lunesta [Eszopiclone] Other (See Comments)    Not effective   Sonata [Zaleplon] Other (See Comments)    Not effective   Trazodone And Nefazodone Other (See Comments)    ineffective   Zolpimist [Zolpidem Tartrate] Other (See Comments)    Not effective   Azithromycin Other (See Comments)    Reaction not recalled by the patient    Past Medical History:  Diagnosis Date   Anemia     Anxiety    Barrett esophagus    Bipolar 1 disorder ([place])    sees Dr. Candis Schatz psych [phone])   Closed fracture distal radius and ulna, left, initial encounter 04/20/2019   S/p ORIF 04/2019 Jeannie Fend)    COPD (chronic obstructive pulmonary disease) ([place]) 03/2013   hyperinflation by CXR   Elevated alkaline phosphatase level    Esophagitis    Hiatal hernia    History of cocaine dependence ([place]) latest 2020   undergoing detox   History of stomach ulcers    Hx: UTI (urinary tract infection)    Migraine    "stopped in ~ 2008" (03/11/2017)   Muscle strain of gluteal region, right, subsequent encounter 10/05/2018   By MRI 2020   Osteoporosis 06/09/2018   DEXA 05/2017 - T score -2.5 L hip, -1.3 spine   Positive PPD 1989   s/p CXR and INH 6 mo   Seizure ([place]) X 1   "from takiing too many headache medicine?" (03/11/2017)   Smoker     Family History  Problem Relation  Age of Onset   Diabetes Mother        T2DM   Breast cancer Mother 21   Dementia Mother    Lymphoma Mother    Coronary artery disease Father 27       MI   Anxiety disorder Paternal Aunt    Anxiety disorder Cousin    Depression Cousin    ADD / ADHD Child    Anxiety disorder Child    Depression Child    ADD / ADHD Son    ADD / ADHD Son    Suicidality Paternal Uncle    Depression Paternal Uncle    Stroke Neg Hx    Colon cancer Neg Hx    Cancer - Colon Neg Hx    Esophageal cancer Neg Hx    Stomach cancer Neg Hx    Rectal cancer Neg Hx     Social History   Socioeconomic History   Marital status: Divorced    Spouse name: Not on file   Number of children: 2   Years of education: bachelors   Highest education level: Not on file  Occupational History   Occupation: Product manager: [employer]    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, working on disability  Tobacco Use   Smoking status: Every Day    Packs/day: 1.00    Years: 36.00    Pack years: 36.00    Types: Cigarettes   Smokeless tobacco:  Never  Vaping Use   Vaping Use: Never used  Substance and Sexual Activity   Alcohol use: Not Currently    Alcohol/week: 0.0 standard drinks    Comment: 03/11/2017 "stopped 09/18/2015; never an alcoholic"   Drug use: Not Currently    Comment: not currently   Sexual activity: Never    Birth control/protection: Surgical  Other Topics Concern   Not on file  Social History Narrative   Caffeine: 40 oz diet mountain dew/day   Lives with husband and 2 children, no pets      Social Determinants of Health   Financial Resource Strain: Not on file  Food Insecurity: Not on file  Transportation Needs: Not on file  Physical Activity: Not on file  Stress: Not on file  Social Connections: Not on file  Intimate Partner Violence: Not on file    Past Medical History, Surgical history, Social history, and Family history were reviewed and updated as appropriate.   Please see review of systems for further details on the patient's review from today.   Objective:   Physical Exam:  BP 119/84    Pulse (!) 101    LMP 04/23/2015 (Approximate)   Physical Exam Neurological:     Mental Status: She is alert and oriented to person, place, and time.     Cranial Nerves: No dysarthria.  Psychiatric:        Attention and Perception: Attention and perception normal.        Mood and Affect: Mood normal.        Speech: Speech normal.        Behavior: Behavior is cooperative.        Thought Content: Thought content normal. Thought content is not paranoid or delusional. Thought content does not include homicidal or suicidal ideation. Thought content does not include homicidal or suicidal plan.        Cognition and Memory: Cognition and memory normal.        Judgment: Judgment normal.     Comments: Insight intact  Lab Review:     Component Value Date/Time   NA 121 (LL) 04/03/2020 1006   K 3.6 04/03/2020 1006   CL 84 (L) 04/03/2020 1006   CO2 29 04/03/2020 1006   GLUCOSE 93 04/03/2020 1006   BUN 12  04/03/2020 1006   CREATININE 0.56 04/03/2020 1006   CALCIUM 8.2 (L) 04/03/2020 1006   PROT 6.1 04/03/2020 1006   ALBUMIN 3.1 (L) 04/03/2020 1006   AST 14 04/03/2020 1006   ALT 11 04/03/2020 1006   ALKPHOS 133 (H) 04/03/2020 1006   BILITOT 0.4 04/03/2020 1006   GFRNONAA >60 03/18/2020 0434   GFRAA >60 03/18/2020 0434       Component Value Date/Time   WBC 15.2 (H) 04/03/2020 1006   RBC 3.58 (L) 04/03/2020 1006   HGB 11.2 (L) 04/03/2020 1006   HGB 10.8 (L) 07/11/2018 1050   HCT 33.0 (L) 04/03/2020 1006   PLT 823.0 (H) 04/03/2020 1006   PLT 547 (H) 07/11/2018 1050   MCV 92.2 04/03/2020 1006   MCH 32.3 03/18/2020 0434   MCHC 34.0 04/03/2020 1006   RDW 14.2 04/03/2020 1006   LYMPHSABS 1.0 04/03/2020 1006   MONOABS 0.7 04/03/2020 1006   EOSABS 0.3 04/03/2020 1006   BASOSABS 0.2 (H) 04/03/2020 1006    Lithium Lvl  Date Value Ref Range Status  12/27/2010 1.43 (H) 0.80 - 1.40 mEq/L Final     No results found for: PHENYTOIN, PHENOBARB, VALPROATE, CBMZ   .res Assessment: Plan:   Will increase Adderall XR to 40 mg po q am for ADHD. Discussed that this would be highest dose used since higher doses could increase HR. Recommended monitoring HR and contacting office if she experiences an increase in HR. Continue Vraylar 3 mg po qd for mood s/s. Pt continues to receive Pt Assistance for Vraylar.  Continue Seroquel 800 mg po QHS for insomnia and mood s/s. Discussed possible dose reduction in the future if mood and insomnia remain well controlled.  Pt to follow-up in 3 months or sooner if clinically indicated.  Patient advised to contact office with any questions, adverse effects, or acute worsening in signs and symptoms.  Surina was seen today for follow-up.  Diagnoses and all orders for this visit:  Attention deficit hyperactivity disorder (ADHD), combined type -     amphetamine-dextroamphetamine (ADDERALL XR) 20 MG 24 hr capsule; Take 2 capsules (40 mg total) by mouth daily. -      amphetamine-dextroamphetamine (ADDERALL XR) 20 MG 24 hr capsule; Take 2 capsules (40 mg total) by mouth daily. -     amphetamine-dextroamphetamine (ADDERALL XR) 20 MG 24 hr capsule; Take 2 capsules (40 mg total) by mouth daily.  Insomnia due to mental disorder -     QUEtiapine (SEROQUEL) 200 MG tablet; Take 4 tablets (800 mg total) by mouth at bedtime.  Bipolar I disorder (HCC) -     QUEtiapine (SEROQUEL) 200 MG tablet; Take 4 tablets (800 mg total) by mouth at bedtime.     Please see After Visit Summary for patient specific instructions.  No future appointments.  No orders of the defined types were placed in this encounter.     -------------------------------

## 2021-09-25 ENCOUNTER — Telehealth: Payer: Self-pay | Admitting: Psychiatry

## 2021-09-25 ENCOUNTER — Other Ambulatory Visit: Payer: Self-pay

## 2021-09-25 DIAGNOSIS — F902 Attention-deficit hyperactivity disorder, combined type: Secondary | ICD-10-CM

## 2021-09-25 MED ORDER — AMPHETAMINE-DEXTROAMPHET ER 20 MG PO CP24: 40.0000 mg | ORAL_CAPSULE | Freq: Every day | ORAL | 0 refills | Status: DC

## 2021-09-25 NOTE — Telephone Encounter (Signed)
[facility] before repending to [facility].

## 2021-09-25 NOTE — Telephone Encounter (Signed)
Pt stated the [facility].  She requested refill of Adderall sent to   [facility], [place], [place] [address].  She has confirmed availability there.  No upcoming appt scheduled.

## 2021-09-25 NOTE — Telephone Encounter (Signed)
Last filled 12/30, too early to fill. Will mark to F/U.

## 2021-10-21 ENCOUNTER — Telehealth: Payer: Self-pay | Admitting: Psychiatry

## 2021-10-21 NOTE — Telephone Encounter (Signed)
Pt called requesting  Adderall Rx to [facility] Going forward. Refill due end of week. [facility]  for [facility] & [facility]. Not in stock. Canc and send to [facility]. Apt due April

## 2021-10-22 NOTE — Telephone Encounter (Signed)
Changed pharmacy and called pending [facility]. LVM for patien to [facility]. With shortage of Adderall don't want to send Rx early.

## 2021-10-22 NOTE — Telephone Encounter (Signed)
Patient RC. Told her we would not send advance scripts at this time and she was in agreement. She will check availability before next refill and let us know pharmacy.

## 2021-10-24 ENCOUNTER — Other Ambulatory Visit: Payer: Self-pay | Admitting: Psychiatry

## 2021-10-24 DIAGNOSIS — F902 Attention-deficit hyperactivity disorder, combined type: Secondary | ICD-10-CM

## 2021-10-24 MED ORDER — AMPHETAMINE-DEXTROAMPHET ER 20 MG PO CP24: 40.0000 mg | ORAL_CAPSULE | Freq: Every day | ORAL | 0 refills | Status: DC

## 2021-10-24 NOTE — Telephone Encounter (Signed)
Diahn called because went to her pharmacy [facility] to pick up her Adderall but they don't have the prescription.  They have the medication, but they need the p0rescription.  Please send the prescription to [facility]

## 2021-11-11 ENCOUNTER — Telehealth: Payer: Self-pay | Admitting: Psychiatry

## 2021-11-11 NOTE — Telephone Encounter (Signed)
Pt called around 10:00 reporting she gets help with 3 meds and will bring paperwork to provider to complete provider part. Requesting samples of Vraylar 3 mg, until receive supply. OK to get some samples for her? If so, how many?    ?

## 2021-11-19 ENCOUNTER — Telehealth: Payer: Self-pay | Admitting: Psychiatry

## 2021-11-19 NOTE — Telephone Encounter (Signed)
Pt called at 2:28 pm asking for a refill on her adderall to be sent to [facility]. They do have it in stock. It is due on saturday ?

## 2021-11-21 ENCOUNTER — Other Ambulatory Visit: Payer: Self-pay

## 2021-11-21 DIAGNOSIS — F902 Attention-deficit hyperactivity disorder, combined type: Secondary | ICD-10-CM

## 2021-11-21 MED ORDER — AMPHETAMINE-DEXTROAMPHET ER 20 MG PO CP24: 40.0000 mg | ORAL_CAPSULE | Freq: Every day | ORAL | 0 refills | Status: DC

## 2021-11-21 NOTE — Telephone Encounter (Signed)
Pended.

## 2021-12-03 ENCOUNTER — Other Ambulatory Visit: Payer: Self-pay | Admitting: Family Medicine

## 2021-12-03 ENCOUNTER — Telehealth: Payer: Self-pay | Admitting: Family Medicine

## 2021-12-03 NOTE — Telephone Encounter (Signed)
Caller Name: Lenox Ladouceur ?Call back phone #: [phone] ? ?MEDICATION(S): Nexium ? ? ?Days of Med Remaining: none ? ?Has the patient contacted their pharmacy (YES/NO)?  yes ?IF YES, when and what did the pharmacy advise?  ?IF NO, request that the patient contact the pharmacy for the refills in the future.  ?           The pharmacy will send an electronic request (except for controlled medications). ? ? ? ?~~~Please advise patient/caregiver to allow 2-3 business days to process RX refills.  ?

## 2021-12-03 NOTE — Telephone Encounter (Signed)
Left a detailed message on voicemail to call the office. Patient needs an appointment scheduled when she calls back. Patient is overdue for a CPE.Patient's last visit was a video with Dr. Glori Bickers 04/17/20 and she has cancelled several appointments.  ?

## 2021-12-04 MED ORDER — ESOMEPRAZOLE MAGNESIUM 40 MG PO CPDR: DELAYED_RELEASE_CAPSULE | ORAL | 0 refills | Status: DC

## 2021-12-04 NOTE — Telephone Encounter (Signed)
Pt has OV with Dr. Darnell Level on 12/22/21 at 10:00. ? ?E-scribed a 30-day refill.  ?

## 2021-12-12 ENCOUNTER — Telehealth: Payer: Self-pay

## 2021-12-12 DIAGNOSIS — K219 Gastro-esophageal reflux disease without esophagitis: Secondary | ICD-10-CM

## 2021-12-12 MED ORDER — ESOMEPRAZOLE MAGNESIUM 40 MG PO CPDR: DELAYED_RELEASE_CAPSULE | ORAL | 0 refills | Status: DC

## 2021-12-12 NOTE — Telephone Encounter (Signed)
Pharmacy faxed request for 90 day supply.  ?

## 2021-12-17 ENCOUNTER — Telehealth: Payer: Self-pay | Admitting: Psychiatry

## 2021-12-17 DIAGNOSIS — F902 Attention-deficit hyperactivity disorder, combined type: Secondary | ICD-10-CM

## 2021-12-17 MED ORDER — AMPHETAMINE-DEXTROAMPHET ER 20 MG PO CP24: 40.0000 mg | ORAL_CAPSULE | Freq: Every day | ORAL | 0 refills | Status: DC

## 2021-12-17 NOTE — Telephone Encounter (Signed)
Please let her know it has been sent and can be filled on Friday.  ?

## 2021-12-17 NOTE — Telephone Encounter (Signed)
Pt called at 4:37 pm said that her pharmacy does have the adderall  xr20 mg in stock. Please send to [facility] ?

## 2021-12-19 ENCOUNTER — Ambulatory Visit: Payer: Medicare HMO | Admitting: Psychiatry

## 2021-12-19 ENCOUNTER — Telehealth: Payer: Self-pay | Admitting: Psychiatry

## 2021-12-19 NOTE — Telephone Encounter (Signed)
Pt LVM at 9:29a stating that for her [place], there was some missing info that [place] needed to fill in. ? ?Next appt. 5/1 ?

## 2021-12-22 ENCOUNTER — Ambulatory Visit: Payer: Medicare HMO | Admitting: Family Medicine

## 2021-12-23 ENCOUNTER — Telehealth: Payer: Self-pay | Admitting: Psychiatry

## 2021-12-23 NOTE — Telephone Encounter (Signed)
Pt said she was approved for Vraylar, but she needs to come get some samples before she gets her script.  Pls pull samples and let her know when the samples are available to be picked up. ? ? ?Next appt 5/17 ?

## 2021-12-24 NOTE — Telephone Encounter (Signed)
Samples pulled. LVM to RC.  ?

## 2021-12-29 ENCOUNTER — Ambulatory Visit: Payer: Medicare HMO | Admitting: Psychiatry

## 2022-01-07 ENCOUNTER — Telehealth: Payer: Self-pay | Admitting: Family Medicine

## 2022-01-07 DIAGNOSIS — J449 Chronic obstructive pulmonary disease, unspecified: Secondary | ICD-10-CM

## 2022-01-07 NOTE — Telephone Encounter (Signed)
Does she mean bone density scan or spirometry? I don't think we have spirometry available at our office still.  ?

## 2022-01-07 NOTE — Telephone Encounter (Signed)
Spoke to patient by telephone and was advised that she was told years ago that she had COPD. Patient stated that she does not have a pulmonologist. Patient stated that she has a device on her car that is required by [facility]. Patient stated that she has to blow into the machine in order to drive her car. Patient stated that the machine shows if she has alcohol in her system or not. Patient stated that she is not able to blow hard enough to get the machine to get it to work. Patient stated that she was told by someone if she has a spirometry done she may be able to get a machine that is not so hard to blow into. Patient stated that she is willing to go anywhere in [place] to get this test done because she is having to sit at home and not go anywhere.  ?

## 2022-01-07 NOTE — Telephone Encounter (Signed)
Pt wants to ask if she can get a scoliometer test done at her next apt on 01/14/2022.  ? ?Callback Number: [phone] ?

## 2022-01-08 NOTE — Telephone Encounter (Signed)
Spoke pt relaying Dr. Synthia Innocent message.  Pt verbalizes understanding and expresses her thanks.  Also, scheduled CPE on 04/15/22 at 2:30. ?

## 2022-01-08 NOTE — Telephone Encounter (Addendum)
H/o COPD, severe emphysema on prior imaging study. ?Referral placed to pulm in [facility] for further evaluation of FEV1 - she may qualify for different breathalyzer.  ?I last saw patient 03/2020. Please schedule CPE, may do fasting labs on same day.  ?

## 2022-01-08 NOTE — Addendum Note (Signed)
Addended by: Ria Bush on: 01/08/2022 11:55 AM ? ? Modules accepted: Orders ? ?

## 2022-01-13 ENCOUNTER — Ambulatory Visit: Payer: Medicare PPO | Admitting: Family Medicine

## 2022-01-14 ENCOUNTER — Ambulatory Visit: Payer: Medicare PPO | Admitting: Family Medicine

## 2022-01-14 ENCOUNTER — Ambulatory Visit: Payer: Medicare HMO | Admitting: Psychiatry

## 2022-01-14 ENCOUNTER — Telehealth: Payer: Self-pay | Admitting: Psychiatry

## 2022-01-14 DIAGNOSIS — F902 Attention-deficit hyperactivity disorder, combined type: Secondary | ICD-10-CM

## 2022-01-14 MED ORDER — AMPHETAMINE-DEXTROAMPHET ER 20 MG PO CP24: 40.0000 mg | ORAL_CAPSULE | Freq: Every day | ORAL | 0 refills | Status: DC

## 2022-01-14 NOTE — Telephone Encounter (Signed)
Please let her know that a script with a fill date for 01/17/22 has been sent to [facility] since they have it in stock. ?

## 2022-01-14 NOTE — Telephone Encounter (Signed)
Adderall XR cannot be refilled before 01/17/22. Please check with pharmacy to see if they would be able to order and have Adderall XR available by Saturday.  ?

## 2022-01-14 NOTE — Telephone Encounter (Signed)
Pt lvm at 1:26 pm asking on a refill on her adderall. The pharmacy is out of the xr of adderall and so she wants regular adderall 20 mg sent to [place]. She takes 2  a day ?

## 2022-01-14 NOTE — Telephone Encounter (Signed)
Pharmacy said XR dosing is on backorder and they don't have a timeframe for when they will received a supply.  ?

## 2022-01-15 NOTE — Telephone Encounter (Signed)
Patient RC and information provided.

## 2022-01-15 NOTE — Telephone Encounter (Signed)
LVM to RC 

## 2022-01-19 NOTE — Progress Notes (Unsigned)
Synopsis: Referred for COPD with chronic bronchitis by Ria Bush, MD  Subjective:   PATIENT ID: Robin Welch GENDER: female DOB: 23-Dec-1965, MRN: [id number]  No chief complaint on file.  [name] with history of BiPD, COPD, esophagitis and hiatal hernia, cocaine use, OP, positive ppd s/p 6 mos INH referred for COPD and interlock device  Needs lab recheck?  Otherwise pertinent review of systems is negative.  Past Medical History:  Diagnosis Date   Anemia    Anxiety    Barrett esophagus    Bipolar 1 disorder ([place])    sees Dr. Candis Schatz psych [phone])   Closed fracture distal radius and ulna, left, initial encounter 04/20/2019   S/p ORIF 04/2019 Jeannie Fend)    COPD (chronic obstructive pulmonary disease) ([place]) 03/2013   hyperinflation by CXR   Elevated alkaline phosphatase level    Esophagitis    Hiatal hernia    History of cocaine dependence ([place]) latest 2020   undergoing detox   History of stomach ulcers    Hx: UTI (urinary tract infection)    Migraine    "stopped in ~ 2008" (03/11/2017)   Muscle strain of gluteal region, right, subsequent encounter 10/05/2018   By MRI 2020   Osteoporosis 06/09/2018   DEXA 05/2017 - T score -2.5 L hip, -1.3 spine   Positive PPD 1989   s/p CXR and INH 6 mo   Seizure ([place]) X 1   "from takiing too many headache medicine?" (03/11/2017)   Smoker      Family History  Problem Relation Age of Onset   Diabetes Mother        T2DM   Breast cancer Mother 9   Dementia Mother    Lymphoma Mother    Coronary artery disease Father 48       MI   Anxiety disorder Paternal Aunt    Anxiety disorder Cousin    Depression Cousin    ADD / ADHD Child    Anxiety disorder Child    Depression Child    ADD / ADHD Son    ADD / ADHD Son    Suicidality Paternal Uncle    Depression Paternal Uncle    Stroke Neg Hx    Colon cancer Neg Hx    Cancer - Colon Neg Hx    Esophageal cancer Neg Hx    Stomach cancer Neg Hx    Rectal cancer Neg Hx      Past  Surgical History:  Procedure Laterality Date   BIOPSY  07/19/2019   Procedure: BIOPSY;  Surgeon: Gatha Mayer, MD;  [facility]: [facility];  Service: Endoscopy;;   COLONOSCOPY  07/2018   poor colon prep - rec repeat (Nandigam)   DILATION AND CURETTAGE OF UTERUS  X 1   S/P miscarriage   ESOPHAGOGASTRODUODENOSCOPY  03/2017   candidal esophagitis, treated, LA grade D esophagitis, 4cm HH (Nandigam)   ESOPHAGOGASTRODUODENOSCOPY  07/2018   severe reflux esophagitis, neg candida (Nandigam)   ESOPHAGOGASTRODUODENOSCOPY  09/29/2019   oozing esophageal ulcers, benign appearing strictures (Nandigam)   ESOPHAGOGASTRODUODENOSCOPY  05/2020   grade D esophagitis, benign severe stenosis dilated, 7cm HH ([facility] Dr Mariea Clonts)   ESOPHAGOGASTRODUODENOSCOPY  10/2020   LA Grade D esophagitis, severe stenosis s/p dilation, 6cm HH ([facility] Dr Mariea Clonts)   ESOPHAGOGASTRODUODENOSCOPY (EGD) WITH PROPOFOL N/A 07/19/2019   Procedure: ESOPHAGOGASTRODUODENOSCOPY (EGD) WITH PROPOFOL;  Surgeon: Gatha Mayer, MD;  [facility];  Service: Endoscopy;  Laterality: N/A;   ESOPHAGOGASTRODUODENOSCOPY (EGD) WITH PROPOFOL N/A 10/24/2019  Procedure: ESOPHAGOGASTRODUODENOSCOPY (EGD) WITH PROPOFOL;  Surgeon: Mauri Pole, MD;  [facility];  Service: Endoscopy;  Laterality: N/A;   ESOPHAGOGASTRODUODENOSCOPY (EGD) WITH PROPOFOL N/A 11/21/2019   Procedure: ESOPHAGOGASTRODUODENOSCOPY (EGD) WITH PROPOFOL;  Surgeon: Mauri Pole, MD;  [facility];  Service: Endoscopy;  Laterality: N/A;   LAPAROSCOPIC CHOLECYSTECTOMY  2006   ORIF WRIST FRACTURE Right 04/08/2019   Procedure: OPEN REDUCTION INTERNAL FIXATION (ORIF) DISTAL RADIUS/ULNA FRACTURE;  Surgeon: Verner Mould, MD;  Location: [facility];  Service: Orthopedics;  Laterality: Right;   TUBAL LIGATION  2003    Social History   Socioeconomic History   Marital status: Divorced    Spouse name: Not on file   Number of children: 2   Years of  education: bachelors   Highest education level: Not on file  Occupational History   Occupation: Product manager: [employer]    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, working on disability  Tobacco Use   Smoking status: Every Day    Packs/day: 1.00    Years: 36.00    Pack years: 36.00    Types: Cigarettes   Smokeless tobacco: Never  Vaping Use   Vaping Use: Never used  Substance and Sexual Activity   Alcohol use: Not Currently    Alcohol/week: 0.0 standard drinks    Comment: 03/11/2017 "stopped 09/18/2015; never an alcoholic"   Drug use: Not Currently    Comment: not currently   Sexual activity: Never    Birth control/protection: Surgical  Other Topics Concern   Not on file  Social History Narrative   Caffeine: 40 oz diet mountain dew/day   Lives with husband and 2 children, no pets      Social Determinants of Health   Financial Resource Strain: Not on file  Food Insecurity: Not on file  Transportation Needs: Not on file  Physical Activity: Not on file  Stress: Not on file  Social Connections: Not on file  Intimate Partner Violence: Not on file     Allergies  Allergen Reactions   Prozac [Fluoxetine Hcl] Swelling and Rash   Doxycycline Nausea And Vomiting   Celexa [Citalopram Hydrobromide] Other (See Comments)    Possible allergic reaction   Chantix [Varenicline Tartrate] Swelling    Mouth swelling   Hydroxyzine Other (See Comments)    Not effective   Klonopin [Clonazepam] Other (See Comments)    Caused instablility. Not able to drive due to over medicated   Lunesta [Eszopiclone] Other (See Comments)    Not effective   Sonata [Zaleplon] Other (See Comments)    Not effective   Trazodone And Nefazodone Other (See Comments)    ineffective   Zolpimist [Zolpidem Tartrate] Other (See Comments)    Not effective   Azithromycin Other (See Comments)    Reaction not recalled by the patient     Outpatient Medications Prior to Visit  Medication Sig Dispense  Refill   albuterol (VENTOLIN HFA) 108 (90 Base) MCG/ACT inhaler INHALE 2 PUFFS BY MOUTH EVERY 6 HOURS AS NEEDED FOR WHEEZING OR SHORTNESS OF BREATH (Patient not taking: Reported on 09/11/2021) 54 g 0   amphetamine-dextroamphetamine (ADDERALL XR) 20 MG 24 hr capsule Take 2 capsules (40 mg total) by mouth daily. 60 capsule 0   amphetamine-dextroamphetamine (ADDERALL XR) 20 MG 24 hr capsule Take 2 capsules (40 mg total) by mouth daily. 60 capsule 0   amphetamine-dextroamphetamine (ADDERALL XR) 20 MG 24 hr capsule Take 2 capsules (40 mg total) by mouth  daily. 60 capsule 0   calcium carbonate (CALCIUM 600) 600 MG TABS tablet Take 1 tablet (600 mg total) by mouth daily with breakfast. 3 tablet    cariprazine (VRAYLAR) capsule Take 3 mg by mouth daily.     Cholecalciferol (VITAMIN D) 50 MCG (2000 UT) CAPS Take 1 capsule (2,000 Units total) by mouth daily. (Patient taking differently: Take 2,000 Units by mouth daily.)     cholestyramine light (PREVALITE) 4 g packet Take 0.5 packets (2 g total) by mouth 2 (two) times daily. (Patient not taking: Reported on 05/02/2020) 30 packet 3   esomeprazole (NEXIUM) 40 MG capsule TAKE 1 CAPSULE(40 MG) BY MOUTH DAILY 90 capsule 0   Multiple Vitamin (MULTIVITAMIN WITH MINERALS) TABS tablet Take 1 tablet by mouth daily.     QUEtiapine (SEROQUEL) 200 MG tablet Take 4 tablets (800 mg total) by mouth at bedtime. 360 tablet 1   sucralfate (CARAFATE) 1 g tablet TAKE 1 TABLET(1 GRAM) BY MOUTH FOUR TIMES DAILY WITH MEALS AND AT BEDTIME AS NEEDED FOR UPSET STOMACH OR REFLUX 360 tablet 0   Tiotropium Bromide Monohydrate (SPIRIVA RESPIMAT) 2.5 MCG/ACT AERS Inhale 1 puff into the lungs daily. (Patient not taking: Reported on 05/02/2020) 4 g 6   No facility-administered medications prior to visit.       Objective:   Physical Exam:  General appearance: 56 y.o., female, NAD, conversant  Eyes: anicteric sclerae; PERRL, tracking appropriately HENT: NCAT; MMM Neck: Trachea midline; no  lymphadenopathy, no JVD Lungs: CTAB, no crackles, no wheeze, with normal respiratory effort CV: RRR, no murmur  Abdomen: Soft, non-tender; non-distended, BS present  Extremities: No peripheral edema, warm Skin: Normal turgor and texture; no rash Psych: Appropriate affect Neuro: Alert and oriented to person and place, no focal deficit     There were no vitals filed for this visit.   on *** LPM *** RA BMI Readings from Last 3 Encounters:  04/01/20 20.12 kg/m  03/16/20 20.12 kg/m  11/21/19 20.12 kg/m   Wt Readings from Last 3 Encounters:  04/01/20 110 lb (49.9 kg)  03/16/20 110 lb (49.9 kg)  11/21/19 110 lb 0.2 oz (49.9 kg)     CBC    Component Value Date/Time   WBC 15.2 (H) 04/03/2020 1006   RBC 3.58 (L) 04/03/2020 1006   HGB 11.2 (L) 04/03/2020 1006   HGB 10.8 (L) 07/11/2018 1050   HCT 33.0 (L) 04/03/2020 1006   PLT 823.0 (H) 04/03/2020 1006   PLT 547 (H) 07/11/2018 1050   MCV 92.2 04/03/2020 1006   MCH 32.3 03/18/2020 0434   MCHC 34.0 04/03/2020 1006   RDW 14.2 04/03/2020 1006   LYMPHSABS 1.0 04/03/2020 1006   MONOABS 0.7 04/03/2020 1006   EOSABS 0.3 04/03/2020 1006   BASOSABS 0.2 (H) 04/03/2020 1006    ***  Chest Imaging: CTA Chest 2021 reviewed by me with emphysema, bronchial wall thickening and multifocal pneumonia  Pulmonary Functions Testing Results:     View : No data to display.          FeNO: ***  Pathology: ***  Echocardiogram: ***  Heart Catheterization: ***    Assessment & Plan:    Plan:      Maryjane Hurter, MD [facility] 01/19/2022 12:58 PM

## 2022-01-20 ENCOUNTER — Institutional Professional Consult (permissible substitution): Payer: Medicare PPO | Admitting: Student

## 2022-01-30 ENCOUNTER — Telehealth: Payer: Self-pay

## 2022-01-30 ENCOUNTER — Telehealth (INDEPENDENT_AMBULATORY_CARE_PROVIDER_SITE_OTHER): Payer: Medicare PPO | Admitting: Internal Medicine

## 2022-01-30 ENCOUNTER — Encounter: Payer: Self-pay | Admitting: Internal Medicine

## 2022-01-30 DIAGNOSIS — J019 Acute sinusitis, unspecified: Secondary | ICD-10-CM | POA: Diagnosis not present

## 2022-01-30 DIAGNOSIS — J449 Chronic obstructive pulmonary disease, unspecified: Secondary | ICD-10-CM

## 2022-01-30 MED ORDER — PREDNISONE 20 MG PO TABS: 40.0000 mg | ORAL_TABLET | Freq: Every day | ORAL | 0 refills | Status: DC

## 2022-01-30 MED ORDER — AMOXICILLIN-POT CLAVULANATE 875-125 MG PO TABS: 1.0000 | ORAL_TABLET | Freq: Two times a day (BID) | ORAL | 0 refills | Status: DC

## 2022-01-30 NOTE — Telephone Encounter (Signed)
[facility] Night - Client Nonclinical Telephone Record  [facility] Client [facility] Night - Client Client Site [place] - Night Contact Type Call Who Is Calling Patient / Member / Family / Caregiver Caller Name C[place] Phone Number [phone] Patient Name Shaquasia Caponigro Patient DOB [birth year] Call Type Message Only Information Provided Reason for Call Request to Schedule Office Appointment Initial Comment Caller states that she needs to have a Televisit before 1230pm today. Declined triage. Patient request to speak to RN No Disp. Time Disposition Final User 01/30/2022 7:40:56 AM General Information Provided Yes Windy Canny Call Closed By: Windy Canny Transaction Date/Time: 01/30/2022 7:38:46 AM (ET  Pt already has appt scheduled 01/30/22 at 9:30 with Dr Silvio Pate.

## 2022-01-30 NOTE — Assessment & Plan Note (Signed)
Normal breathing and talking (fast) on video visit---but couldn't blow out into breathalyzer to start car Likely mild exacerbation Will give prednisone burst-- 40 daily x 3, then 20 daily x 3 Has appt with pulmonologist coming up Discussed cigarette cessation (doesn't seem to be ready)

## 2022-01-30 NOTE — Progress Notes (Signed)
Subjective:    Patient ID: Robin Welch, female    DOB: [birth year], 56 y.o.   MRN: [id number]  HPI Video virtual visit due to persistent respiratory symptoms Identification done Reviewed limitations and billing and she gave consent Participants--patient in her car at work and I am in my office  Started 2.5 weeks ago Has breathalyzer for car---couldn't even blow enough to do this Will be going back to pulmonary  Now having headaches Drainage from sinuses Coughing up [place]/yellow mucus  No fever Doesn't feel SOB  Current Outpatient Medications on File Prior to Visit  Medication Sig Dispense Refill   albuterol (VENTOLIN HFA) 108 (90 Base) MCG/ACT inhaler INHALE 2 PUFFS BY MOUTH EVERY 6 HOURS AS NEEDED FOR WHEEZING OR SHORTNESS OF BREATH 54 g 0   amphetamine-dextroamphetamine (ADDERALL XR) 20 MG 24 hr capsule Take 2 capsules (40 mg total) by mouth daily. 60 capsule 0   calcium carbonate (CALCIUM 600) 600 MG TABS tablet Take 1 tablet (600 mg total) by mouth daily with breakfast. 3 tablet    cariprazine (VRAYLAR) capsule Take 3 mg by mouth daily.     Cholecalciferol (VITAMIN D) 50 MCG (2000 UT) CAPS Take 1 capsule (2,000 Units total) by mouth daily. (Patient taking differently: Take 2,000 Units by mouth daily.)     cholestyramine light (PREVALITE) 4 g packet Take 0.5 packets (2 g total) by mouth 2 (two) times daily. 30 packet 3   esomeprazole (NEXIUM) 40 MG capsule TAKE 1 CAPSULE(40 MG) BY MOUTH DAILY 90 capsule 0   Multiple Vitamin (MULTIVITAMIN WITH MINERALS) TABS tablet Take 1 tablet by mouth daily.     QUEtiapine (SEROQUEL) 200 MG tablet Take 4 tablets (800 mg total) by mouth at bedtime. 360 tablet 1   sucralfate (CARAFATE) 1 g tablet TAKE 1 TABLET(1 GRAM) BY MOUTH FOUR TIMES DAILY WITH MEALS AND AT BEDTIME AS NEEDED FOR UPSET STOMACH OR REFLUX 360 tablet 0   Tiotropium Bromide Monohydrate (SPIRIVA RESPIMAT) 2.5 MCG/ACT AERS Inhale 1 puff into the lungs daily. 4 g 6    amphetamine-dextroamphetamine (ADDERALL XR) 20 MG 24 hr capsule Take 2 capsules (40 mg total) by mouth daily. 60 capsule 0   amphetamine-dextroamphetamine (ADDERALL XR) 20 MG 24 hr capsule Take 2 capsules (40 mg total) by mouth daily. 60 capsule 0   No current facility-administered medications on file prior to visit.    Allergies  Allergen Reactions   Prozac [Fluoxetine Hcl] Swelling and Rash   Doxycycline Nausea And Vomiting   Celexa [Citalopram Hydrobromide] Other (See Comments)    Possible allergic reaction   Chantix [Varenicline Tartrate] Swelling    Mouth swelling   Hydroxyzine Other (See Comments)    Not effective   Klonopin [Clonazepam] Other (See Comments)    Caused instablility. Not able to drive due to over medicated   Lunesta [Eszopiclone] Other (See Comments)    Not effective   Sonata [Zaleplon] Other (See Comments)    Not effective   Trazodone And Nefazodone Other (See Comments)    ineffective   Zolpimist [Zolpidem Tartrate] Other (See Comments)    Not effective   Azithromycin Other (See Comments)    Reaction not recalled by the patient    Past Medical History:  Diagnosis Date   Anemia    Anxiety    Barrett esophagus    Bipolar 1 disorder ([place])    sees Dr. Candis Schatz psych [phone])   Closed fracture distal radius and ulna, left, initial encounter 04/20/2019   S/p ORIF  04/2019 Jeannie Fend)    COPD (chronic obstructive pulmonary disease) ([place]) 03/2013   hyperinflation by CXR   Elevated alkaline phosphatase level    Esophagitis    Hiatal hernia    History of cocaine dependence ([place]) latest 2020   undergoing detox   History of stomach ulcers    Hx: UTI (urinary tract infection)    Migraine    "stopped in ~ 2008" (03/11/2017)   Muscle strain of gluteal region, right, subsequent encounter 10/05/2018   By MRI 2020   Osteoporosis 06/09/2018   DEXA 05/2017 - T score -2.5 L hip, -1.3 spine   Positive PPD 1989   s/p CXR and INH 6 mo   Seizure ([place]) X 1   "from  takiing too many headache medicine?" (03/11/2017)   Smoker     Past Surgical History:  Procedure Laterality Date   BIOPSY  07/19/2019   Procedure: BIOPSY;  Surgeon: Gatha Mayer, MD;  [facility];  Service: Endoscopy;;   COLONOSCOPY  07/2018   poor colon prep - rec repeat (Nandigam)   DILATION AND CURETTAGE OF UTERUS  X 1   S/P miscarriage   ESOPHAGOGASTRODUODENOSCOPY  03/2017   candidal esophagitis, treated, LA grade D esophagitis, 4cm HH (Nandigam)   ESOPHAGOGASTRODUODENOSCOPY  07/2018   severe reflux esophagitis, neg candida (Nandigam)   ESOPHAGOGASTRODUODENOSCOPY  09/29/2019   oozing esophageal ulcers, benign appearing strictures (Nandigam)   ESOPHAGOGASTRODUODENOSCOPY  05/2020   grade D esophagitis, benign severe stenosis dilated, 7cm HH ([facility] Dr Mariea Clonts)   ESOPHAGOGASTRODUODENOSCOPY  10/2020   LA Grade D esophagitis, severe stenosis s/p dilation, 6cm HH ([facility] Dr Mariea Clonts)   ESOPHAGOGASTRODUODENOSCOPY (EGD) WITH PROPOFOL N/A 07/19/2019   Procedure: ESOPHAGOGASTRODUODENOSCOPY (EGD) WITH PROPOFOL;  Surgeon: Gatha Mayer, MD;  [facility];  Service: Endoscopy;  Laterality: N/A;   ESOPHAGOGASTRODUODENOSCOPY (EGD) WITH PROPOFOL N/A 10/24/2019   Procedure: ESOPHAGOGASTRODUODENOSCOPY (EGD) WITH PROPOFOL;  Surgeon: Mauri Pole, MD;  [facility];  Service: Endoscopy;  Laterality: N/A;   ESOPHAGOGASTRODUODENOSCOPY (EGD) WITH PROPOFOL N/A 11/21/2019   Procedure: ESOPHAGOGASTRODUODENOSCOPY (EGD) WITH PROPOFOL;  Surgeon: Mauri Pole, MD;  [facility];  Service: Endoscopy;  Laterality: N/A;   LAPAROSCOPIC CHOLECYSTECTOMY  2006   ORIF WRIST FRACTURE Right 04/08/2019   Procedure: OPEN REDUCTION INTERNAL FIXATION (ORIF) DISTAL RADIUS/ULNA FRACTURE;  Surgeon: Verner Mould, MD;  Location: [facility];  Service: Orthopedics;  Laterality: Right;   TUBAL LIGATION  2003    Family History  Problem Relation Age of Onset   Diabetes Mother         T2DM   Breast cancer Mother 61   Dementia Mother    Lymphoma Mother    Coronary artery disease Father 7       MI   Anxiety disorder Paternal Aunt    Anxiety disorder Cousin    Depression Cousin    ADD / ADHD Child    Anxiety disorder Child    Depression Child    ADD / ADHD Son    ADD / ADHD Son    Suicidality Paternal Uncle    Depression Paternal Uncle    Stroke Neg Hx    Colon cancer Neg Hx    Cancer - Colon Neg Hx    Esophageal cancer Neg Hx    Stomach cancer Neg Hx    Rectal cancer Neg Hx     Social History   Socioeconomic History   Marital status: Divorced    Spouse name: Not on file  Number of children: 2   Years of education: bachelors   Highest education level: Not on file  Occupational History   Occupation: Product manager: [employer]    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, working on disability  Tobacco Use   Smoking status: Every Day    Packs/day: 1.00    Years: 36.00    Pack years: 36.00    Types: Cigarettes   Smokeless tobacco: Never  Vaping Use   Vaping Use: Never used  Substance and Sexual Activity   Alcohol use: Not Currently    Alcohol/week: 0.0 standard drinks    Comment: 03/11/2017 "stopped 09/18/2015; never an alcoholic"   Drug use: Not Currently    Comment: not currently   Sexual activity: Never    Birth control/protection: Surgical  Other Topics Concern   Not on file  Social History Narrative   Caffeine: 40 oz diet mountain dew/day   Lives with husband and 2 children, no pets      Social Determinants of Health   Financial Resource Strain: Not on file  Food Insecurity: Not on file  Transportation Needs: Not on file  Physical Activity: Not on file  Stress: Not on file  Social Connections: Not on file  Intimate Partner Violence: Not on file   Review of Systems Still smoking--discussed Doesn't remember any recent medication for COPD No N/V Eating okay Has regained some weight     Objective:   Physical  Exam Constitutional:      Appearance: Normal appearance.  Pulmonary:     Effort: Pulmonary effort is normal. No respiratory distress.  Neurological:     Mental Status: She is alert.           Assessment & Plan:

## 2022-01-30 NOTE — Assessment & Plan Note (Signed)
Persistent symptoms over a week that seem centered in her sinuses Will treat with augmentin 875 bid x 7 days

## 2022-02-10 ENCOUNTER — Ambulatory Visit (INDEPENDENT_AMBULATORY_CARE_PROVIDER_SITE_OTHER): Payer: Medicare PPO | Admitting: Internal Medicine

## 2022-02-10 ENCOUNTER — Encounter: Payer: Self-pay | Admitting: Internal Medicine

## 2022-02-10 ENCOUNTER — Ambulatory Visit: Payer: Medicare HMO | Admitting: Psychiatry

## 2022-02-10 VITALS — BP 124/86 | HR 92 | Ht 62.0 in | Wt 138.2 lb

## 2022-02-10 DIAGNOSIS — J449 Chronic obstructive pulmonary disease, unspecified: Secondary | ICD-10-CM

## 2022-02-10 MED ORDER — STIOLTO RESPIMAT 2.5-2.5 MCG/ACT IN AERS: 2.0000 | INHALATION_SPRAY | Freq: Every day | RESPIRATORY_TRACT | 3 refills | Status: DC

## 2022-02-10 MED ORDER — ALBUTEROL SULFATE HFA 108 (90 BASE) MCG/ACT IN AERS: 2.0000 | INHALATION_SPRAY | Freq: Four times a day (QID) | RESPIRATORY_TRACT | 3 refills | Status: DC | PRN

## 2022-02-10 MED ORDER — STIOLTO RESPIMAT 2.5-2.5 MCG/ACT IN AERS: 2.0000 | INHALATION_SPRAY | Freq: Every day | RESPIRATORY_TRACT | 0 refills | Status: DC

## 2022-02-10 MED ORDER — BUPROPION HCL ER (SR) 150 MG PO TB12: 150.0000 mg | ORAL_TABLET | Freq: Two times a day (BID) | ORAL | 3 refills | Status: DC

## 2022-02-10 NOTE — Addendum Note (Signed)
Addended by: Lenice Llamas on: 02/10/2022 05:13 PM   Modules accepted: Orders

## 2022-02-10 NOTE — Progress Notes (Signed)
I did speak with her primary psychiatry provider who was ok with a trial of wellbutrin for smoking cessation. Will prescribe to pharmacy and call patient. She will be seen by them in 2 weeks and be closely monitor for worsening anxiety or mania.

## 2022-02-10 NOTE — Progress Notes (Signed)
The patient has been prescribed the inhaler stiolto and albuterol. Inhaler technique was demonstrated to patient. The patient subsequently demonstrated correct technique.   

## 2022-02-10 NOTE — Patient Instructions (Addendum)
Please schedule follow up scheduled with myself in 3 months.  If my schedule is not open yet, we will contact you with a reminder closer to that time. Please call [phone] if you haven't heard from [place] a month before.   I will reach out to your psychiatry team about the wellbutrin for smoking cessation. If they think it is safe we will call you and prescribe it.  Follow up with Dr. Verlee Monte this week about your breathalyzer machine.   Before your next visit I would like you to have: Full set of PFTs  Start stiolto maintenance inhaler 2 puffs once a day.   Take the albuterol rescue inhaler every 4 to 6 hours as needed for wheezing or shortness of breath. You can also take it 15 minutes before exercise or exertional activity. Side effects include heart racing or pounding, jitters or anxiety. If you have a history of an irregular heart rhythm, it can make this worse. Can also give some patients a hard time sleeping.  What are the benefits of quitting smoking? Quitting smoking can lower your chances of getting or dying from heart disease, lung disease, kidney failure, infection, or cancer. It can also lower your chances of getting osteoporosis, a condition that makes your bones weak. Plus, quitting smoking can help your skin look younger and reduce the chances that you will have problems with sex.  Quitting smoking will improve your health no matter how old you are, and no matter how long or how much you have smoked.  What should I do if I want to quit smoking? The letters in the word "START" can help you remember the steps to take: S = Set a quit date. T = Tell family, friends, and the people around you that you plan to quit. A = Anticipate or plan ahead for the tough times you'll face while quitting. R = Remove cigarettes and other tobacco products from your home, car, and work. T = Talk to your doctor about getting help to quit.  How can my doctor or nurse help? Your doctor or nurse can  give you advice on the best way to quit. He or she can also put you in touch with counselors or other people you can call for support. Plus, your doctor or nurse can give you medicines to: ?Reduce your craving for cigarettes ?Reduce the unpleasant symptoms that happen when you stop smoking (called "withdrawal symptoms"). You can also get help from a free phone line (1-800-QUIT-NOW) or go online to [facility].  What are the symptoms of withdrawal? The symptoms include: ?Trouble sleeping ?Being irritable, anxious or restless ?Getting frustrated or angry ?Having trouble thinking clearly  Some people who stop smoking become temporarily depressed. Some people need treatment for depression, such as counseling or antidepressant medicines. Depressed people might: ?No longer enjoy or care about doing the things they used to like to do ?Feel sad, down, hopeless, nervous, or cranky most of the day, almost every day ?Lose or gain weight ?Sleep too much or too little ?Feel tired or like they have no energy ?Feel guilty or like they are worth nothing ?Forget things or feel confused ?Move and speak more slowly than usual ?Act restless or have trouble staying still ?Think about death or suicide  If you think you might be depressed, see your doctor or nurse. Only someone trained in mental health can tell for sure if you are depressed. If you ever feel like you might hurt yourself, go straight  to the nearest emergency department. Or you can call for an ambulance (in the [place] and [place], [place] 9-1-1) or call your doctor or nurse right away and tell them it is an emergency. You can also reach the [place] National Suicide Prevention Lifeline at (867)781-4155 or http://walker-sanchez.info/.  How do medicines help you stop smoking? Different medicines work in different ways: ?Nicotine replacement therapy eases withdrawal and reduces your body's craving for nicotine, the main drug found in cigarettes.  There are different forms of nicotine replacement, including skin patches, lozenges, gum, nasal sprays, and "puffers" or inhalers. Many can be bought without a prescription, while others might require one. ?Bupropion is a prescription medicine that reduces your desire to smoke. This medicine is sold under the brand names Zyban and Wellbutrin. It is also available in a generic version, which is cheaper than brand name medicines. ?Varenicline (brand names: Chantix, Champix) is a prescription medicine that reduces withdrawal symptoms and cigarette cravings. If you think you'd like to take varenicline and you have a history of depression, anxiety, or heart disease, discuss this with your doctor or nurse before taking the medicine. Varenicline can also increase the effects of alcohol in some people. It's a good idea to limit drinking while you're taking it, at least until you know how it affects you.  How does counseling work? Counseling can happen during formal office visits or just over the phone. A counselor can help you: ?Figure out what triggers your smoking and what to do instead ?Overcome cravings ?Figure out what went wrong when you tried to quit before  What works best? Studies show that people have the best luck at quitting if they take medicines to help them quit and work with a Social worker. It might also be helpful to combine nicotine replacement with one of the prescription medicines that help people quit. In some cases, it might even make sense to take bupropion and varenicline together.  What about e-cigarettes? Sometimes people wonder if using electronic cigarettes, or "e-cigarettes," might help them quit smoking. Using e-cigarettes is also called "vaping." Doctors do not recommend e-cigarettes in place of medicines and counseling. That's because e-cigarettes still contain nicotine as well as other substances that might be harmful. It's not clear how they can affect a person's health in the  long term.  Will I gain weight if I quit? Yes, you might gain a few pounds. But quitting smoking will have a much more positive effect on your health than weighing a few pounds more. Plus, you can help prevent some weight gain by being more active and eating less. Taking the medicine bupropion might help control weight gain.   What else can I do to improve my chances of quitting? You can: ?Start exercising. ?Stay away from smokers and places that you associate with smoking. If people close to you smoke, ask them to quit with you. ?Keep gum, hard candy, or something to put in your mouth handy. If you get a craving for a cigarette, try one of these instead. ?Don't give up, even if you start smoking again. It takes most people a few tries before they succeed.  What if I am pregnant and I smoke? If you are pregnant, it's really important for the health of your baby that you quit. Ask your doctor what options you have, and what is safest for your baby

## 2022-02-10 NOTE — Progress Notes (Signed)
[place]    [id number]    08-Mar-1966  Primary Care Physician:Gutierrez, Garlon Hatchet, MD  Referring Physician: Ria Bush, MD [address] [place],  [place] [address] Reason for Consultation: copd, needs breathalyzer in car Date of Consultation: 02/10/2022  Chief complaint:   Chief Complaint  Patient presents with   Consult    Referred by PCP for history of COPD. Needs a spirometer for the DMV as she is not able breathe into machine.      HPI:  Robin Welch is a 56 y.o. woman who presents for new patient evaluation for COPD. She has a breathalyzer in her car due to prior DWI and can no longer use it (has been using it well for the past few months) but now having more difficulty. She was treated with steroids and abx for COPD exacerbation and she is still unable to use it.   She currently does not take any inhalers or breathing treatments for her COPD. Previously was taking some but then she changed her diet and felt better and now they are all expired.   From chart review she had an ED visit for pneumonia and COPD exacerbation back in 2021.   She does have dyspnea with exertion, but lives on the 3rd floor and can carry groceries up the stairs as long as she goes slowly.   Social history:  Occupation: works as a Education officer, [employer].  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, working on disability  Tobacco Use   Smoking status: Every Day    Packs/day: 1.00    Years: 36.00    Total pack years: 36.00    Types: Cigarettes   Smokeless tobacco: Never  Vaping Use   Vaping Use: Never used  Substance and Sexual Activity   Alcohol use: Not Currently    Alcohol/week: 0.0 standard drinks of alcohol    Comment: 03/11/2017 "stopped 09/18/2015; never an alcoholic"   Drug use: Not Currently    Comment: not currently   Sexual  activity: Never    Birth control/protection: Surgical    Relevant family history:  Family History  Problem Relation Age of Onset   Diabetes Mother        T2DM   Breast cancer Mother 66   Dementia Mother    Lymphoma Mother    Coronary artery disease Father 18       MI   CAD Father    ADD / ADHD Son    ADD / ADHD Son    ADD / ADHD Child    Anxiety disorder Child    Depression Child    Anxiety disorder Paternal Aunt    Suicidality Paternal Uncle    Depression Paternal Uncle    Anxiety disorder Cousin    Depression Cousin    Stroke Neg Hx    Colon cancer Neg Hx    Cancer - Colon Neg Hx    Esophageal cancer Neg Hx    Stomach cancer Neg Hx    Rectal cancer Neg Hx     Past Medical History:  Diagnosis Date   Anemia    Anxiety    Barrett esophagus    Bipolar 1 disorder ([place])    sees Dr. Candis Schatz psych [phone])  Closed fracture distal radius and ulna, left, initial encounter 04/20/2019   S/p ORIF 04/2019 Jeannie Fend)    COPD (chronic obstructive pulmonary disease) ([place]) 03/2013   hyperinflation by CXR   Elevated alkaline phosphatase level    Esophagitis    Hiatal hernia    History of cocaine dependence ([place]) latest 2020   undergoing detox   History of stomach ulcers    Hx: UTI (urinary tract infection)    Migraine    "stopped in ~ 2008" (03/11/2017)   Muscle strain of gluteal region, right, subsequent encounter 10/05/2018   By MRI 2020   Osteoporosis 06/09/2018   DEXA 05/2017 - T score -2.5 L hip, -1.3 spine   Positive PPD 1989   s/p CXR and INH 6 mo   Seizure ([place]) X 1   "from takiing too many headache medicine?" (03/11/2017)   Smoker     Past Surgical History:  Procedure Laterality Date   BIOPSY  07/19/2019   Procedure: BIOPSY;  Surgeon: Gatha Mayer, MD;  Location: [facility];  Service: Endoscopy;;   COLONOSCOPY  07/2018   poor colon prep - rec repeat ([place])   DILATION AND CURETTAGE OF UTERUS  X 1   S/P miscarriage   ESOPHAGOGASTRODUODENOSCOPY   03/2017   candidal esophagitis, treated, LA grade D esophagitis, 4cm HH ([place]am)   ESOPHAGOGASTRODUODENOSCOPY  07/2018   severe reflux esophagitis, neg candida ([place])   ESOPHAGOGASTRODUODENOSCOPY  09/29/2019   oozing esophageal ulcers, benign appearing strictures ([place])   ESOPHAGOGASTRODUODENOSCOPY  05/2020   grade D esophagitis, benign severe stenosis dilated, 7cm HH ([facility] Dr Mariea Clonts)   ESOPHAGOGASTRODUODENOSCOPY  10/2020   LA Grade D esophagitis, severe stenosis s/p dilation, 6cm HH ([facility] Dr Mariea Clonts)   ESOPHAGOGASTRODUODENOSCOPY (EGD) WITH PROPOFOL N/A 07/19/2019   Procedure: ESOPHAGOGASTRODUODENOSCOPY (EGD) WITH PROPOFOL;  Surgeon: Gatha Mayer, MD;  Location: [facility];  Service: Endoscopy;  Laterality: N/A;   ESOPHAGOGASTRODUODENOSCOPY (EGD) WITH PROPOFOL N/A 10/24/2019   Procedure: ESOPHAGOGASTRODUODENOSCOPY (EGD) WITH PROPOFOL;  Surgeon: Mauri Pole, MD;  Location: [facility];  Service: Endoscopy;  Laterality: N/A;   ESOPHAGOGASTRODUODENOSCOPY (EGD) WITH PROPOFOL N/A 11/21/2019   Procedure: ESOPHAGOGASTRODUODENOSCOPY (EGD) WITH PROPOFOL;  Surgeon: Mauri Pole, MD;  Location: [facility];  Service: Endoscopy;  Laterality: N/A;   LAPAROSCOPIC CHOLECYSTECTOMY  2006   ORIF WRIST FRACTURE Right 04/08/2019   Procedure: OPEN REDUCTION INTERNAL FIXATION (ORIF) DISTAL RADIUS/ULNA FRACTURE;  Surgeon: Verner Mould, MD;  Location: [facility];  Service: Orthopedics;  Laterality: Right;   TUBAL LIGATION  2003     Physical Exam: Blood pressure 124/86, pulse 92, height '5\' 2"'$  (1.575 m), weight 138 lb 3.2 oz (62.7 kg), last menstrual period 04/23/2015, SpO2 97 %. Gen:      No acute distress ENT:  no nasal polyps, mucus membranes moist Lungs:    No increased respiratory effort, symmetric chest wall excursion, clear to auscultation bilaterally, no wheezes or crackles CV:         Regular rate and rhythm; no murmurs, rubs, or gallops.  No pedal edema Abd:      + bowel  sounds; soft, non-tender; no distension MSK: no acute synovitis of DIP or PIP joints, no mechanics hands.  Skin:      Warm and dry; no rashes Neuro: normal speech, no focal facial asymmetry Psych: alert and oriented x3, normal mood and affect   Data Reviewed/Medical Decision Making:  Independent interpretation of tests: Imaging:  Review of patient's CT July 2021 images revealed severe emphysema  and multifocal pneumonia. The patient's images have been independently reviewed by me.    PFTs: I have personally reviewed the patient's PFTs and      No data to display          Labs:  Lab Results  Component Value Date   WBC 15.2 (H) 04/03/2020   HGB 11.2 (L) 04/03/2020   HCT 33.0 (L) 04/03/2020   MCV 92.2 04/03/2020   PLT 823.0 (H) 04/03/2020   Lab Results  Component Value Date   NA 121 (LL) 04/03/2020   K 3.6 04/03/2020   CL 84 (L) 04/03/2020   CO2 29 04/03/2020     Immunization status:  Immunization History  Administered Date(s) Administered   Hep A / Hep B 04/13/2013   Influenza Whole 05/31/2013   Influenza,inj,Quad PF,6+ Mos 10/01/2016   Moderna Sars-Covid-2 Vaccination 10/17/2019   Pneumococcal Polysaccharide-23 03/12/2017   Tdap 03/02/2013     I reviewed prior external note(s) from ED, PCP, psych  I reviewed the result(s) of the labs and imaging as noted above.   I have ordered PFT  Discussion of management or test interpretation with another colleague.   Assessment:  COPD with radiographic emphysema Need for breathalyzer evaluation Biopolar disorder   Plan/Recommendations: Start stiolto maintenance inhaler with albuterol as needed.  Inhaler teaching done.   PFTs to evaluate for degree of severity of COPD. She also needs to follow up with Dr. Verlee Monte following PFT I ordered to evaluate this. Form to be signed.   She would like to try wellbutrin for smoking cessation. I will clear this with her psychiatry team before starting since she has bipolar  disorder.   We discussed disease management and progression at length today.   Smoking Cessation Counseling:  1. The patient is an everyday smoker and symptomatic due to the following condition copd 2. The patient is currently contemplative in quitting smoking. 3. I advised patient to quit smoking. 4. We identified patient specific barriers to change.  5. I personally spent 3 minutes counseling the patient regarding tobacco use disorder. 6. We discussed management of stress and anxiety to help with smoking cessation, when applicable. 7. We discussed nicotine replacement therapy, Wellbutrin, Chantix as possible options. 8. I advised setting a quit date. 9. Follow?up arranged with our office to continue ongoing discussions. 10.Resources given to patient including quit hotline.    Return to Care: Return in about 3 months (around 05/13/2022).  Lenice Llamas, MD Pulmonary and [place]  CC: Ria Bush, MD

## 2022-02-10 NOTE — Progress Notes (Signed)
Patient seen in the office today and instructed on use of Stiolto.  Patient expressed understanding and demonstrated technique.  Robin Welch [facility] 02/10/2022

## 2022-02-11 ENCOUNTER — Telehealth: Payer: Self-pay

## 2022-02-11 ENCOUNTER — Telehealth: Payer: Self-pay | Admitting: Internal Medicine

## 2022-02-11 ENCOUNTER — Ambulatory Visit: Payer: Medicare PPO | Admitting: Family Medicine

## 2022-02-11 ENCOUNTER — Ambulatory Visit (INDEPENDENT_AMBULATORY_CARE_PROVIDER_SITE_OTHER): Payer: Medicare PPO | Admitting: Internal Medicine

## 2022-02-11 DIAGNOSIS — J449 Chronic obstructive pulmonary disease, unspecified: Secondary | ICD-10-CM | POA: Diagnosis not present

## 2022-02-11 LAB — PULMONARY FUNCTION TEST
DL/VA % pred: 72 %
DL/VA: 3.12 ml/min/mmHg/L
DLCO cor % pred: 66 %
DLCO cor: 12.58 ml/min/mmHg
DLCO unc % pred: 66 %
DLCO unc: 12.58 ml/min/mmHg
FEF 25-75 Post: 1.3 L/sec
FEF 25-75 Pre: 1.26 L/sec
FEF2575-%Change-Post: 3 %
FEF2575-%Pred-Post: 56 %
FEF2575-%Pred-Pre: 54 %
FEV1-%Change-Post: 0 %
FEV1-%Pred-Post: 77 %
FEV1-%Pred-Pre: 77 %
FEV1-Post: 1.88 L
FEV1-Pre: 1.87 L
FEV1FVC-%Change-Post: 3 %
FEV1FVC-%Pred-Pre: 92 %
FEV6-%Change-Post: -3 %
FEV6-%Pred-Post: 81 %
FEV6-%Pred-Pre: 84 %
FEV6-Post: 2.45 L
FEV6-Pre: 2.55 L
FEV6FVC-%Change-Post: 0 %
FEV6FVC-%Pred-Post: 101 %
FEV6FVC-%Pred-Pre: 102 %
FVC-%Change-Post: -2 %
FVC-%Pred-Post: 80 %
FVC-%Pred-Pre: 82 %
FVC-Post: 2.5 L
FVC-Pre: 2.58 L
Post FEV1/FVC ratio: 75 %
Post FEV6/FVC ratio: 99 %
Pre FEV1/FVC ratio: 73 %
Pre FEV6/FVC Ratio: 99 %
RV % pred: 79 %
RV: 1.48 L
TLC % pred: 90 %
TLC: 4.3 L

## 2022-02-11 NOTE — Telephone Encounter (Signed)
Called patient again but she did not answer. Left message for her to call us back tomorrow.

## 2022-02-11 NOTE — Progress Notes (Deleted)
Synopsis: Referred for COPD and DMV breathalyzer evaluation by Ria Bush, MD  Subjective:   PATIENT ID: Robin Welch GENDER: female DOB: 58-Nov-1967, MRN: [id number]  No chief complaint on file.  [name] with history of COPD seen by Dr. Shearon Stalls referred for COPD and DMV breathalyzer evaluation  Otherwise pertinent review of systems is negative.  Past Medical History:  Diagnosis Date   Anemia    Anxiety    Barrett esophagus    Bipolar 1 disorder ([place])    sees Dr. Candis Schatz psych [phone])   Closed fracture distal radius and ulna, left, initial encounter 04/20/2019   S/p ORIF 04/2019 Jeannie Fend)    COPD (chronic obstructive pulmonary disease) ([place]) 03/2013   hyperinflation by CXR   Elevated alkaline phosphatase level    Esophagitis    Hiatal hernia    History of cocaine dependence ([place]) latest 2020   undergoing detox   History of stomach ulcers    Hx: UTI (urinary tract infection)    Migraine    "stopped in ~ 2008" (03/11/2017)   Muscle strain of gluteal region, right, subsequent encounter 10/05/2018   By MRI 2020   Osteoporosis 06/09/2018   DEXA 05/2017 - T score -2.5 L hip, -1.3 spine   Positive PPD 1989   s/p CXR and INH 6 mo   Seizure ([place]) X 1   "from takiing too many headache medicine?" (03/11/2017)   Smoker      Family History  Problem Relation Age of Onset   Diabetes Mother        T2DM   Breast cancer Mother 28   Dementia Mother    Lymphoma Mother    Coronary artery disease Father 3       MI   CAD Father    ADD / ADHD Son    ADD / ADHD Son    ADD / ADHD Child    Anxiety disorder Child    Depression Child    Anxiety disorder Paternal Aunt    Suicidality Paternal Uncle    Depression Paternal Uncle    Anxiety disorder Cousin    Depression Cousin    Stroke Neg Hx    Colon cancer Neg Hx    Cancer - Colon Neg Hx    Esophageal cancer Neg Hx    Stomach cancer Neg Hx    Rectal cancer Neg Hx      Past Surgical History:  Procedure Laterality Date    BIOPSY  07/19/2019   Procedure: BIOPSY;  Surgeon: Gatha Mayer, MD;  [facility];  Service: Endoscopy;;   COLONOSCOPY  07/2018   poor colon prep - rec repeat (Nandigam)   DILATION AND CURETTAGE OF UTERUS  X 1   S/P miscarriage   ESOPHAGOGASTRODUODENOSCOPY  03/2017   candidal esophagitis, treated, LA grade D esophagitis, 4cm HH (Nandigam)   ESOPHAGOGASTRODUODENOSCOPY  07/2018   severe reflux esophagitis, neg candida (Nandigam)   ESOPHAGOGASTRODUODENOSCOPY  09/29/2019   oozing esophageal ulcers, benign appearing strictures (Nandigam)   ESOPHAGOGASTRODUODENOSCOPY  05/2020   grade D esophagitis, benign severe stenosis dilated, 7cm HH ([facility] Dr Mariea Clonts)   ESOPHAGOGASTRODUODENOSCOPY  10/2020   LA Grade D esophagitis, severe stenosis s/p dilation, 6cm HH ([facility] Dr Mariea Clonts)   ESOPHAGOGASTRODUODENOSCOPY (EGD) WITH PROPOFOL N/A 07/19/2019   Procedure: ESOPHAGOGASTRODUODENOSCOPY (EGD) WITH PROPOFOL;  Surgeon: Gatha Mayer, MD;  [facility];  Service: Endoscopy;  Laterality: N/A;   ESOPHAGOGASTRODUODENOSCOPY (EGD) WITH PROPOFOL N/A 10/24/2019   Procedure: ESOPHAGOGASTRODUODENOSCOPY (EGD) WITH PROPOFOL;  Surgeon:  Mauri Pole, MD;  Location: [facility];  Service: Endoscopy;  Laterality: N/A;   ESOPHAGOGASTRODUODENOSCOPY (EGD) WITH PROPOFOL N/A 11/21/2019   Procedure: ESOPHAGOGASTRODUODENOSCOPY (EGD) WITH PROPOFOL;  Surgeon: Mauri Pole, MD;  [facility];  Service: Endoscopy;  Laterality: N/A;   LAPAROSCOPIC CHOLECYSTECTOMY  2006   ORIF WRIST FRACTURE Right 04/08/2019   Procedure: OPEN REDUCTION INTERNAL FIXATION (ORIF) DISTAL RADIUS/ULNA FRACTURE;  Surgeon: Verner Mould, MD;  Location: [facility];  Service: Orthopedics;  Laterality: Right;   TUBAL LIGATION  2003    Social History   Socioeconomic History   Marital status: Divorced    Spouse name: Not on file   Number of children: 2   Years of education: bachelors   Highest education level: Not on  file  Occupational History   Occupation: Product manager: [employer]    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, working on disability  Tobacco Use   Smoking status: Every Day    Packs/day: 1.00    Years: 36.00    Total pack years: 36.00    Types: Cigarettes   Smokeless tobacco: Never  Vaping Use   Vaping Use: Never used  Substance and Sexual Activity   Alcohol use: Not Currently    Alcohol/week: 0.0 standard drinks of alcohol    Comment: 03/11/2017 "stopped 09/18/2015; never an alcoholic"   Drug use: Not Currently    Comment: not currently   Sexual activity: Never    Birth control/protection: Surgical  Other Topics Concern   Not on file  Social History Narrative   Caffeine: 40 oz diet mountain dew/day   Lives with husband and 2 children, no pets      Social Determinants of Health   Financial Resource Strain: Not on file  Food Insecurity: Not on file  Transportation Needs: Not on file  Physical Activity: Not on file  Stress: Not on file  Social Connections: Not on file  Intimate Partner Violence: Not on file     Allergies  Allergen Reactions   Prozac [Fluoxetine Hcl] Swelling and Rash   Doxycycline Nausea And Vomiting   Celexa [Citalopram Hydrobromide] Other (See Comments)    Possible allergic reaction   Chantix [Varenicline Tartrate] Swelling    Mouth swelling   Hydroxyzine Other (See Comments)    Not effective   Klonopin [Clonazepam] Other (See Comments)    Caused instablility. Not able to drive due to over medicated   Lunesta [Eszopiclone] Other (See Comments)    Not effective   Sonata [Zaleplon] Other (See Comments)    Not effective   Trazodone And Nefazodone Other (See Comments)    ineffective   Zolpimist [Zolpidem Tartrate] Other (See Comments)    Not effective   Azithromycin Other (See Comments)    Reaction not recalled by the patient     Outpatient Medications Prior to Visit  Medication Sig Dispense Refill   albuterol (VENTOLIN HFA) 108  (90 Base) MCG/ACT inhaler Inhale 2 puffs into the lungs every 6 (six) hours as needed for wheezing or shortness of breath. 18 g 3   amoxicillin-clavulanate (AUGMENTIN) 875-125 MG tablet Take 1 tablet by mouth 2 (two) times daily. 14 tablet 0   amphetamine-dextroamphetamine (ADDERALL XR) 20 MG 24 hr capsule Take 2 capsules (40 mg total) by mouth daily. 60 capsule 0   amphetamine-dextroamphetamine (ADDERALL XR) 20 MG 24 hr capsule Take 2 capsules (40 mg total) by mouth daily. 60 capsule 0   amphetamine-dextroamphetamine (ADDERALL XR) 20 MG  24 hr capsule Take 2 capsules (40 mg total) by mouth daily. 60 capsule 0   buPROPion (WELLBUTRIN SR) 150 MG 12 hr tablet Take 1 tablet (150 mg total) by mouth 2 (two) times daily. 60 tablet 3   calcium carbonate (CALCIUM 600) 600 MG TABS tablet Take 1 tablet (600 mg total) by mouth daily with breakfast. 3 tablet    cariprazine (VRAYLAR) capsule Take 3 mg by mouth daily.     Cholecalciferol (VITAMIN D) 50 MCG (2000 UT) CAPS Take 1 capsule (2,000 Units total) by mouth daily. (Patient taking differently: Take 2,000 Units by mouth daily.)     cholestyramine light (PREVALITE) 4 g packet Take 0.5 packets (2 g total) by mouth 2 (two) times daily. 30 packet 3   esomeprazole (NEXIUM) 40 MG capsule TAKE 1 CAPSULE(40 MG) BY MOUTH DAILY 90 capsule 0   Multiple Vitamin (MULTIVITAMIN WITH MINERALS) TABS tablet Take 1 tablet by mouth daily.     predniSONE (DELTASONE) 20 MG tablet Take 2 tablets (40 mg total) by mouth daily. For 3 days then 1 tab daily for 3 days. 9 tablet 0   QUEtiapine (SEROQUEL) 200 MG tablet Take 4 tablets (800 mg total) by mouth at bedtime. 360 tablet 1   sucralfate (CARAFATE) 1 g tablet TAKE 1 TABLET(1 GRAM) BY MOUTH FOUR TIMES DAILY WITH MEALS AND AT BEDTIME AS NEEDED FOR UPSET STOMACH OR REFLUX 360 tablet 0   Tiotropium Bromide-Olodaterol (STIOLTO RESPIMAT) 2.5-2.5 MCG/ACT AERS Inhale 2 puffs into the lungs daily. 1 each 3   Tiotropium Bromide-Olodaterol  (STIOLTO RESPIMAT) 2.5-2.5 MCG/ACT AERS Inhale 2 puffs into the lungs daily. 8 g 0   No facility-administered medications prior to visit.       Objective:   Physical Exam:  General appearance: 56 y.o., female, NAD, conversant  Eyes: anicteric sclerae; PERRL, tracking appropriately HENT: NCAT; MMM Neck: Trachea midline; no lymphadenopathy, no JVD Lungs: CTAB, no crackles, no wheeze, with normal respiratory effort CV: RRR, no murmur  Abdomen: Soft, non-tender; non-distended, BS present  Extremities: No peripheral edema, warm Skin: Normal turgor and texture; no rash Psych: Appropriate affect Neuro: Alert and oriented to person and place, no focal deficit     There were no vitals filed for this visit.   on *** LPM *** RA BMI Readings from Last 3 Encounters:  02/10/22 25.28 kg/m  01/30/22 26.89 kg/m  04/01/20 20.12 kg/m   Wt Readings from Last 3 Encounters:  02/10/22 138 lb 3.2 oz (62.7 kg)  01/30/22 147 lb (66.7 kg)  04/01/20 110 lb (49.9 kg)     CBC    Component Value Date/Time   WBC 15.2 (H) 04/03/2020 1006   RBC 3.58 (L) 04/03/2020 1006   HGB 11.2 (L) 04/03/2020 1006   HGB 10.8 (L) 07/11/2018 1050   HCT 33.0 (L) 04/03/2020 1006   PLT 823.0 (H) 04/03/2020 1006   PLT 547 (H) 07/11/2018 1050   MCV 92.2 04/03/2020 1006   MCH 32.3 03/18/2020 0434   MCHC 34.0 04/03/2020 1006   RDW 14.2 04/03/2020 1006   LYMPHSABS 1.0 04/03/2020 1006   MONOABS 0.7 04/03/2020 1006   EOSABS 0.3 04/03/2020 1006   BASOSABS 0.2 (H) 04/03/2020 1006    ***  Chest Imaging: CTA Chest 03/17/20 reviewed by me with multifocal pneumonia, emphysema and bronchial wall thickening   Pulmonary Functions Testing Results:    Latest Ref Rng & Units 02/11/2022    2:31 PM  PFT Results  FVC-Pre L 2.58  P  FVC-Predicted Pre % 82  P  FVC-Post L 2.50  P  FVC-Predicted Post % 80  P  Pre FEV1/FVC % % 73  P  Post FEV1/FCV % % 75  P  FEV1-Pre L 1.87  P  FEV1-Predicted Pre % 77  P  FEV1-Post L  1.88  P  DLCO uncorrected ml/min/mmHg 12.58  P  DLCO UNC% % 66  P  DLCO corrected ml/min/mmHg 12.58  P  DLCO COR %Predicted % 66  P  DLVA Predicted % 72  P  TLC L 4.30  P  TLC % Predicted % 90  P  RV % Predicted % 79  P    P Preliminary result   Reviewed by me remarkable for mild obstruction, FEV1 1.88L, 77% pred, mildly reduced diffusing capacity       Assessment & Plan:    Plan:      Robin Hurter, MD [facility] 02/11/2022 7:37 PM

## 2022-02-11 NOTE — Telephone Encounter (Signed)
Patient had Virtual visit with Dr. Silvio Pate on 6/2. Was given antimitotic and steroid was felling better by symptoms started back today. Patient requested another antibiotic called in. She was seen by pulmonology yesterday but not symptomatic. States she has call into their office as well. Advised that she needs evaluation states she may go urgent care but wanted me to send message to see if our office would just call something in.

## 2022-02-11 NOTE — Telephone Encounter (Signed)
Was seen virtually last visit treated for sinusitis and COPD exacebation. Needs in office visit. Plz call and schedule with available provider.

## 2022-02-11 NOTE — Progress Notes (Signed)
Full PFT Performed Today  

## 2022-02-11 NOTE — Patient Instructions (Signed)
Full PFT Performed Today  

## 2022-02-11 NOTE — Telephone Encounter (Signed)
Patient notified as instructed by telephone and verbalized understanding. Patient stated that she is not able to drive because of a machine on her car. Patient stated that she had to go to pulmonary yesterday but all she saw was a tech. Patient stated that she lives too far from the office and can not afford [employer] to take her everywhere that she has to go. Patient stated that she may have to go to an Urgent Care closer to where she lives.

## 2022-02-11 NOTE — Telephone Encounter (Signed)
Called patient but she did not answer. Left message for patient to call back.  

## 2022-02-12 ENCOUNTER — Telehealth: Payer: Self-pay | Admitting: Student

## 2022-02-12 ENCOUNTER — Other Ambulatory Visit: Payer: Self-pay | Admitting: Psychiatry

## 2022-02-12 ENCOUNTER — Ambulatory Visit: Payer: Medicare PPO | Admitting: Student

## 2022-02-12 ENCOUNTER — Telehealth: Payer: Self-pay | Admitting: Psychiatry

## 2022-02-12 DIAGNOSIS — F5105 Insomnia due to other mental disorder: Secondary | ICD-10-CM

## 2022-02-12 DIAGNOSIS — F902 Attention-deficit hyperactivity disorder, combined type: Secondary | ICD-10-CM

## 2022-02-12 DIAGNOSIS — F319 Bipolar disorder, unspecified: Secondary | ICD-10-CM

## 2022-02-12 MED ORDER — AMPHETAMINE-DEXTROAMPHETAMINE 20 MG PO TABS: 20.0000 mg | ORAL_TABLET | Freq: Two times a day (BID) | ORAL | 0 refills | Status: DC

## 2022-02-12 NOTE — Telephone Encounter (Signed)
Pt called reporting [facility] has [facility]R 20 mg 2/d but the cost is $165 for this month. Can't afford it. Paid $65 last month. [facility] has  Adderall IR 20 mg 2/d. Options? You wanted her to stay on XR, but cost issue. Advise Pt  [phone]

## 2022-02-12 NOTE — Telephone Encounter (Signed)
Please let her know Adderall IR 20 mg twice daily was sent to [facility] for this month until we can discuss plan at her next apt.

## 2022-02-12 NOTE — Telephone Encounter (Signed)
Patient notified

## 2022-02-12 NOTE — Telephone Encounter (Signed)
Please let her know she may want to try using [facility] at [facility] because it looks like her cost would be $87.10. It also looks like it is cheaper with Good Rx at other pharmacies, ie. $50.46 at [facility], $49.47 at [facility], $43 at [facility]. We can send it to a different pharmacy if needed due to cost.

## 2022-02-12 NOTE — Telephone Encounter (Signed)
Pt notified about [facility]. [facility] stated [facility] is [facility] only [facility] that [facility]. She also said they only have XR 30 mg. She takes XR 20 mg 2/d.

## 2022-02-12 NOTE — Telephone Encounter (Signed)
Pt was scheduled for appt with Dr Verlee Monte today regarding her ignition interlock device medical accomodation form  She did not show for this appt  Dr Verlee Monte has reviewed her PFT and states that she does not meet qualification to be exempt from this device  I have the form in A pod at my desk  Will hold in case she calls regarding this

## 2022-02-13 NOTE — Telephone Encounter (Signed)
Forms signed and given back to pt

## 2022-02-15 DIAGNOSIS — J301 Allergic rhinitis due to pollen: Secondary | ICD-10-CM | POA: Diagnosis not present

## 2022-02-15 DIAGNOSIS — R300 Dysuria: Secondary | ICD-10-CM | POA: Diagnosis not present

## 2022-02-15 DIAGNOSIS — J40 Bronchitis, not specified as acute or chronic: Secondary | ICD-10-CM | POA: Diagnosis not present

## 2022-02-16 NOTE — Progress Notes (Unsigned)
Synopsis: Referred for COPD and DMV breathalyzer evaluation by Ria Bush, MD  Subjective:   PATIENT ID: Robin Welch GENDER: female DOB: [birth year], MRN: [id number]  No chief complaint on file.  [name] with history of COPD seen by Dr. Shearon Stalls referred for COPD and DMV breathalyzer evaluation  Otherwise pertinent review of systems is negative.  Past Medical History:  Diagnosis Date   Anemia    Anxiety    Barrett esophagus    Bipolar 1 disorder ([place])    sees Dr. Candis Schatz psych [phone])   Closed fracture distal radius and ulna, left, initial encounter 04/20/2019   S/p ORIF 04/2019 Jeannie Fend)    COPD (chronic obstructive pulmonary disease) ([place]) 03/2013   hyperinflation by CXR   Elevated alkaline phosphatase level    Esophagitis    Hiatal hernia    History of cocaine dependence ([place]) latest 2020   undergoing detox   History of stomach ulcers    Hx: UTI (urinary tract infection)    Migraine    "stopped in ~ 2008" (03/11/2017)   Muscle strain of gluteal region, right, subsequent encounter 10/05/2018   By MRI 2020   Osteoporosis 06/09/2018   DEXA 05/2017 - T score -2.5 L hip, -1.3 spine   Positive PPD 1989   s/p CXR and INH 6 mo   Seizure ([place]) X 1   "from takiing too many headache medicine?" (03/11/2017)   Smoker      Family History  Problem Relation Age of Onset   Diabetes Mother        T2DM   Breast cancer Mother 50   Dementia Mother    Lymphoma Mother    Coronary artery disease Father 73       MI   CAD Father    ADD / ADHD Son    ADD / ADHD Son    ADD / ADHD Child    Anxiety disorder Child    Depression Child    Anxiety disorder Paternal Aunt    Suicidality Paternal Uncle    Depression Paternal Uncle    Anxiety disorder Cousin    Depression Cousin    Stroke Neg Hx    Colon cancer Neg Hx    Cancer - Colon Neg Hx    Esophageal cancer Neg Hx    Stomach cancer Neg Hx    Rectal cancer Neg Hx      Past Surgical History:  Procedure Laterality Date    BIOPSY  07/19/2019   Procedure: BIOPSY;  Surgeon: Gatha Mayer, MD;  [facility];  Service: Endoscopy;;   COLONOSCOPY  07/2018   poor colon prep - rec repeat (Nandigam)   DILATION AND CURETTAGE OF UTERUS  X 1   S/P miscarriage   ESOPHAGOGASTRODUODENOSCOPY  03/2017   candidal esophagitis, treated, LA grade D esophagitis, 4cm HH (Nandigam)   ESOPHAGOGASTRODUODENOSCOPY  07/2018   severe reflux esophagitis, neg candida (Nandigam)   ESOPHAGOGASTRODUODENOSCOPY  09/29/2019   oozing esophageal ulcers, benign appearing strictures (Nandigam)   ESOPHAGOGASTRODUODENOSCOPY  05/2020   grade D esophagitis, benign severe stenosis dilated, 7cm HH ([facility] Dr Mariea Clonts)   ESOPHAGOGASTRODUODENOSCOPY  10/2020   LA Grade D esophagitis, severe stenosis s/p dilation, 6cm HH ([facility] Dr Mariea Clonts)   ESOPHAGOGASTRODUODENOSCOPY (EGD) WITH PROPOFOL N/A 07/19/2019   Procedure: ESOPHAGOGASTRODUODENOSCOPY (EGD) WITH PROPOFOL;  Surgeon: Gatha Mayer, MD;  [facility];  Service: Endoscopy;  Laterality: N/A;   ESOPHAGOGASTRODUODENOSCOPY (EGD) WITH PROPOFOL N/A 10/24/2019   Procedure: ESOPHAGOGASTRODUODENOSCOPY (EGD) WITH PROPOFOL;  Surgeon:  Mauri Pole, MD;  Location: [facility];  Service: Endoscopy;  Laterality: N/A;   ESOPHAGOGASTRODUODENOSCOPY (EGD) WITH PROPOFOL N/A 11/21/2019   Procedure: ESOPHAGOGASTRODUODENOSCOPY (EGD) WITH PROPOFOL;  Surgeon: Mauri Pole, MD;  [facility];  Service: Endoscopy;  Laterality: N/A;   LAPAROSCOPIC CHOLECYSTECTOMY  2006   ORIF WRIST FRACTURE Right 04/08/2019   Procedure: OPEN REDUCTION INTERNAL FIXATION (ORIF) DISTAL RADIUS/ULNA FRACTURE;  Surgeon: Verner Mould, MD;  Location: [facility];  Service: Orthopedics;  Laterality: Right;   TUBAL LIGATION  2003    Social History   Socioeconomic History   Marital status: Divorced    Spouse name: Not on file   Number of children: 2   Years of education: bachelors   Highest education level: Not on  file  Occupational History   Occupation: Product manager: [employer]    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, working on disability  Tobacco Use   Smoking status: Every Day    Packs/day: 1.00    Years: 36.00    Total pack years: 36.00    Types: Cigarettes   Smokeless tobacco: Never  Vaping Use   Vaping Use: Never used  Substance and Sexual Activity   Alcohol use: Not Currently    Alcohol/week: 0.0 standard drinks of alcohol    Comment: 03/11/2017 "stopped 09/18/2015; never an alcoholic"   Drug use: Not Currently    Comment: not currently   Sexual activity: Never    Birth control/protection: Surgical  Other Topics Concern   Not on file  Social History Narrative   Caffeine: 40 oz diet mountain dew/day   Lives with husband and 2 children, no pets      Social Determinants of Health   Financial Resource Strain: Not on file  Food Insecurity: Not on file  Transportation Needs: Not on file  Physical Activity: Not on file  Stress: Not on file  Social Connections: Not on file  Intimate Partner Violence: Not on file     Allergies  Allergen Reactions   Prozac [Fluoxetine Hcl] Swelling and Rash   Doxycycline Nausea And Vomiting   Celexa [Citalopram Hydrobromide] Other (See Comments)    Possible allergic reaction   Chantix [Varenicline Tartrate] Swelling    Mouth swelling   Hydroxyzine Other (See Comments)    Not effective   Klonopin [Clonazepam] Other (See Comments)    Caused instablility. Not able to drive due to over medicated   Lunesta [Eszopiclone] Other (See Comments)    Not effective   Sonata [Zaleplon] Other (See Comments)    Not effective   Trazodone And Nefazodone Other (See Comments)    ineffective   Zolpimist [Zolpidem Tartrate] Other (See Comments)    Not effective   Azithromycin Other (See Comments)    Reaction not recalled by the patient     Outpatient Medications Prior to Visit  Medication Sig Dispense Refill   albuterol (VENTOLIN HFA) 108  (90 Base) MCG/ACT inhaler Inhale 2 puffs into the lungs every 6 (six) hours as needed for wheezing or shortness of breath. 18 g 3   amoxicillin-clavulanate (AUGMENTIN) 875-125 MG tablet Take 1 tablet by mouth 2 (two) times daily. 14 tablet 0   amphetamine-dextroamphetamine (ADDERALL XR) 20 MG 24 hr capsule Take 2 capsules (40 mg total) by mouth daily. 60 capsule 0   amphetamine-dextroamphetamine (ADDERALL XR) 20 MG 24 hr capsule Take 2 capsules (40 mg total) by mouth daily. 60 capsule 0   amphetamine-dextroamphetamine (ADDERALL XR) 20 MG  24 hr capsule Take 2 capsules (40 mg total) by mouth daily. 60 capsule 0   amphetamine-dextroamphetamine (ADDERALL) 20 MG tablet Take 1 tablet (20 mg total) by mouth 2 (two) times daily. 60 tablet 0   buPROPion (WELLBUTRIN SR) 150 MG 12 hr tablet Take 1 tablet (150 mg total) by mouth 2 (two) times daily. 60 tablet 3   calcium carbonate (CALCIUM 600) 600 MG TABS tablet Take 1 tablet (600 mg total) by mouth daily with breakfast. 3 tablet    cariprazine (VRAYLAR) capsule Take 3 mg by mouth daily.     Cholecalciferol (VITAMIN D) 50 MCG (2000 UT) CAPS Take 1 capsule (2,000 Units total) by mouth daily. (Patient taking differently: Take 2,000 Units by mouth daily.)     cholestyramine light (PREVALITE) 4 g packet Take 0.5 packets (2 g total) by mouth 2 (two) times daily. 30 packet 3   esomeprazole (NEXIUM) 40 MG capsule TAKE 1 CAPSULE(40 MG) BY MOUTH DAILY 90 capsule 0   Multiple Vitamin (MULTIVITAMIN WITH MINERALS) TABS tablet Take 1 tablet by mouth daily.     predniSONE (DELTASONE) 20 MG tablet Take 2 tablets (40 mg total) by mouth daily. For 3 days then 1 tab daily for 3 days. 9 tablet 0   QUEtiapine (SEROQUEL) 200 MG tablet TAKE 4 TABLETS AT BEDTIME 360 tablet 0   sucralfate (CARAFATE) 1 g tablet TAKE 1 TABLET(1 GRAM) BY MOUTH FOUR TIMES DAILY WITH MEALS AND AT BEDTIME AS NEEDED FOR UPSET STOMACH OR REFLUX 360 tablet 0   Tiotropium Bromide-Olodaterol (STIOLTO RESPIMAT)  2.5-2.5 MCG/ACT AERS Inhale 2 puffs into the lungs daily. 1 each 3   Tiotropium Bromide-Olodaterol (STIOLTO RESPIMAT) 2.5-2.5 MCG/ACT AERS Inhale 2 puffs into the lungs daily. 8 g 0   No facility-administered medications prior to visit.       Objective:   Physical Exam:  General appearance: 56 y.o., female, NAD, conversant  Eyes: anicteric sclerae; PERRL, tracking appropriately HENT: NCAT; MMM Neck: Trachea midline; no lymphadenopathy, no JVD Lungs: CTAB, no crackles, no wheeze, with normal respiratory effort CV: RRR, no murmur  Abdomen: Soft, non-tender; non-distended, BS present  Extremities: No peripheral edema, warm Skin: Normal turgor and texture; no rash Psych: Appropriate affect Neuro: Alert and oriented to person and place, no focal deficit     There were no vitals filed for this visit.   on *** LPM *** RA BMI Readings from Last 3 Encounters:  02/10/22 25.28 kg/m  01/30/22 26.89 kg/m  04/01/20 20.12 kg/m   Wt Readings from Last 3 Encounters:  02/10/22 138 lb 3.2 oz (62.7 kg)  01/30/22 147 lb (66.7 kg)  04/01/20 110 lb (49.9 kg)     CBC    Component Value Date/Time   WBC 15.2 (H) 04/03/2020 1006   RBC 3.58 (L) 04/03/2020 1006   HGB 11.2 (L) 04/03/2020 1006   HGB 10.8 (L) 07/11/2018 1050   HCT 33.0 (L) 04/03/2020 1006   PLT 823.0 (H) 04/03/2020 1006   PLT 547 (H) 07/11/2018 1050   MCV 92.2 04/03/2020 1006   MCH 32.3 03/18/2020 0434   MCHC 34.0 04/03/2020 1006   RDW 14.2 04/03/2020 1006   LYMPHSABS 1.0 04/03/2020 1006   MONOABS 0.7 04/03/2020 1006   EOSABS 0.3 04/03/2020 1006   BASOSABS 0.2 (H) 04/03/2020 1006    ***  Chest Imaging: CTA Chest 03/17/20 reviewed by me with multifocal pneumonia, emphysema and bronchial wall thickening   Pulmonary Functions Testing Results:    Latest Ref Rng &  Units 02/11/2022    2:31 PM  PFT Results  FVC-Pre L 2.58   FVC-Predicted Pre % 82   FVC-Post L 2.50   FVC-Predicted Post % 80   Pre FEV1/FVC % % 73    Post FEV1/FCV % % 75   FEV1-Pre L 1.87   FEV1-Predicted Pre % 77   FEV1-Post L 1.88   DLCO uncorrected ml/min/mmHg 12.58   DLCO UNC% % 66   DLCO corrected ml/min/mmHg 12.58   DLCO COR %Predicted % 66   DLVA Predicted % 72   TLC L 4.30   TLC % Predicted % 90   RV % Predicted % 79    Reviewed by me remarkable for mild obstruction, FEV1 1.88L, 77% pred, mildly reduced diffusing capacity       Assessment & Plan:    Plan:      Maryjane Hurter, MD [facility] 02/16/2022 6:12 PM

## 2022-02-18 ENCOUNTER — Telehealth: Payer: Self-pay | Admitting: Student

## 2022-02-18 ENCOUNTER — Encounter: Payer: Self-pay | Admitting: Student

## 2022-02-18 ENCOUNTER — Ambulatory Visit (INDEPENDENT_AMBULATORY_CARE_PROVIDER_SITE_OTHER): Payer: Medicare PPO | Admitting: Student

## 2022-02-18 VITALS — BP 128/76 | HR 129 | Temp 98.0°F | Ht 62.0 in | Wt 139.0 lb

## 2022-02-18 DIAGNOSIS — J984 Other disorders of lung: Secondary | ICD-10-CM

## 2022-02-18 DIAGNOSIS — J432 Centrilobular emphysema: Secondary | ICD-10-CM | POA: Diagnosis not present

## 2022-02-18 DIAGNOSIS — J441 Chronic obstructive pulmonary disease with (acute) exacerbation: Secondary | ICD-10-CM

## 2022-02-18 DIAGNOSIS — J449 Chronic obstructive pulmonary disease, unspecified: Secondary | ICD-10-CM

## 2022-02-18 NOTE — Patient Instructions (Addendum)
-   albuterol (ventolin) with goodrx is cheaper ~$22 - keep using stiolto 2 puffs daily  - see you in 3 months or sooner to see how stiolto is working for you!

## 2022-02-18 NOTE — Telephone Encounter (Signed)
She says her copay for albuterol (ventolin) inhaler was $59? Are there cheaper formulations for her including nebs?  I gave her a goodrx coupon in the meantime  Thanks!

## 2022-02-19 ENCOUNTER — Ambulatory Visit (INDEPENDENT_AMBULATORY_CARE_PROVIDER_SITE_OTHER): Payer: Medicare PPO | Admitting: Internal Medicine

## 2022-02-19 ENCOUNTER — Encounter: Payer: Self-pay | Admitting: Internal Medicine

## 2022-02-19 ENCOUNTER — Other Ambulatory Visit (HOSPITAL_COMMUNITY): Payer: Self-pay

## 2022-02-19 VITALS — BP 128/76 | HR 103 | Temp 97.6°F | Ht 62.0 in | Wt 139.0 lb

## 2022-02-19 DIAGNOSIS — J441 Chronic obstructive pulmonary disease with (acute) exacerbation: Secondary | ICD-10-CM | POA: Diagnosis not present

## 2022-02-19 NOTE — Patient Instructions (Addendum)
Please schedule follow up scheduled with myself in 3 months.  If my schedule is not open yet, we will contact you with a reminder closer to that time. Please call [phone] if you haven't heard from [place] a month before.   Continue stiolto daily and albuterol as needed.

## 2022-02-23 ENCOUNTER — Ambulatory Visit: Payer: Medicare PPO | Admitting: Family Medicine

## 2022-02-25 NOTE — Telephone Encounter (Signed)
Pt saw Dr. Shearon Stalls for a visit 6/22. Nothing further needed.

## 2022-02-26 ENCOUNTER — Telehealth: Payer: Medicare HMO | Admitting: Psychiatry

## 2022-03-02 DIAGNOSIS — N3001 Acute cystitis with hematuria: Secondary | ICD-10-CM | POA: Diagnosis not present

## 2022-03-02 DIAGNOSIS — R3915 Urgency of urination: Secondary | ICD-10-CM | POA: Diagnosis not present

## 2022-03-04 ENCOUNTER — Other Ambulatory Visit: Payer: Self-pay

## 2022-03-04 DIAGNOSIS — J449 Chronic obstructive pulmonary disease, unspecified: Secondary | ICD-10-CM

## 2022-03-04 DIAGNOSIS — Z9189 Other specified personal risk factors, not elsewhere classified: Secondary | ICD-10-CM

## 2022-03-11 ENCOUNTER — Telehealth: Payer: Self-pay | Admitting: Psychiatry

## 2022-03-11 NOTE — Telephone Encounter (Signed)
Next visit is 03/18/22. Robin Welch is requesting a refill on her Adderall XR 20 mg. She knows she can't pick up until Sunday. Needs called to:  [facility], [address], [place], [place] [address]  Phone number is [phone]  This pharmacy has it in [place].

## 2022-03-12 ENCOUNTER — Telehealth: Payer: Self-pay | Admitting: Psychiatry

## 2022-03-12 NOTE — Telephone Encounter (Signed)
Pt says she has run out of Vraylar '3mg'$  and would  like to come and get to the office and get samples.  She is also asking for someone to put in the bag the phone number she needs to call to get her script.  She normally orders it herself (I guess from the company itself)?

## 2022-03-12 NOTE — Telephone Encounter (Signed)
Pulled samples, wrote # to [employer] on stick note and put in bag. Patient notified.

## 2022-03-13 ENCOUNTER — Other Ambulatory Visit: Payer: Self-pay

## 2022-03-13 ENCOUNTER — Telehealth: Payer: Self-pay | Admitting: Psychiatry

## 2022-03-13 DIAGNOSIS — F902 Attention-deficit hyperactivity disorder, combined type: Secondary | ICD-10-CM

## 2022-03-13 MED ORDER — AMPHETAMINE-DEXTROAMPHET ER 20 MG PO CP24: 40.0000 mg | ORAL_CAPSULE | Freq: Every day | ORAL | 0 refills | Status: DC

## 2022-03-13 NOTE — Telephone Encounter (Signed)
Patient last filled Adderall on 6/17 per PMP database and should not be out already.

## 2022-03-13 NOTE — Telephone Encounter (Signed)
[facility] would have to approve generic adderall IR. Pt does not like the XR. She has tried it and she is up & down with the XR. Please contact Pt [phone] Pt is out and don't want to wait until Monday.

## 2022-03-13 NOTE — Telephone Encounter (Signed)
LVM that PA is required for Adderall. Will let her know about dosage of Adderall when we hear back from [facility].

## 2022-03-13 NOTE — Telephone Encounter (Signed)
Pended.

## 2022-03-15 DIAGNOSIS — J441 Chronic obstructive pulmonary disease with (acute) exacerbation: Secondary | ICD-10-CM | POA: Diagnosis not present

## 2022-03-16 MED ORDER — AMPHETAMINE-DEXTROAMPHETAMINE 20 MG PO TABS: 20.0000 mg | ORAL_TABLET | Freq: Two times a day (BID) | ORAL | 0 refills | Status: DC

## 2022-03-16 NOTE — Telephone Encounter (Signed)
Please let her know script has been sent.

## 2022-03-16 NOTE — Telephone Encounter (Signed)
Pt called stating [employer] will not pay for XR. Asking to send 20 mg IR to [facility].

## 2022-03-16 NOTE — Telephone Encounter (Signed)
Patient has an appt Wednesday. She does not want to take Adderall XR. Please advise what dose to pend.

## 2022-03-16 NOTE — Telephone Encounter (Signed)
Patient notified

## 2022-03-18 ENCOUNTER — Telehealth: Payer: Self-pay

## 2022-03-18 ENCOUNTER — Telehealth (INDEPENDENT_AMBULATORY_CARE_PROVIDER_SITE_OTHER): Payer: Medicare PPO | Admitting: Psychiatry

## 2022-03-18 ENCOUNTER — Encounter: Payer: Self-pay | Admitting: Psychiatry

## 2022-03-18 VITALS — HR 102

## 2022-03-18 DIAGNOSIS — F5105 Insomnia due to other mental disorder: Secondary | ICD-10-CM

## 2022-03-18 DIAGNOSIS — F902 Attention-deficit hyperactivity disorder, combined type: Secondary | ICD-10-CM

## 2022-03-18 DIAGNOSIS — F319 Bipolar disorder, unspecified: Secondary | ICD-10-CM

## 2022-03-18 MED ORDER — METFORMIN HCL 500 MG PO TABS
ORAL_TABLET | ORAL | 2 refills | Status: DC
Start: 2022-03-18 — End: 2022-06-30

## 2022-03-18 MED ORDER — QUETIAPINE FUMARATE 200 MG PO TABS: 800.0000 mg | ORAL_TABLET | Freq: Every day | ORAL | 0 refills | Status: DC

## 2022-03-18 NOTE — Progress Notes (Signed)
Robin Welch [id number] [birth year] 56 y.o.  Virtual Visit via Video Note  /I connected with pt @ on 03/18/22 at 10:30 AM EDT by a video enabled telemedicine application and verified that I am speaking with the correct person using two identifiers.   I discussed the limitations of evaluation and management by telemedicine and the availability of in person appointments. The patient expressed understanding and agreed to proceed.  I discussed the assessment and treatment plan with the patient. The patient was provided an opportunity to ask questions and all were answered. The patient agreed with the plan and demonstrated an understanding of the instructions.   The patient was advised to call back or seek an in-person evaluation if the symptoms worsen or if the condition fails to improve as anticipated.  I provided 30 minutes of non-face-to-face time during this encounter.  The patient was located at home.  The provider was located at [place].   Thayer Headings, PMHNP   Subjective:   Patient ID:  Robin Welch is a 56 y.o. (DOB June 05, 1966) female.  Chief Complaint:  Chief Complaint  Patient presents with   ADHD   Follow-up    Mood disturbance, Anxiety    HPI Robin Welch presents for follow-up of ADHD, Anxiety, and mood disturbance. She reports, "things have gone good until lately." She reports that she has been gaining weight "no matter what I eat." She reports that she tried to reduce Seroquel to 3 tabs and was not able to sleep as well.   She reports that her insurance is no longer covering Adderall XR and that this works better for her. She reports Adderall is not as effective for her. She has been told once that it needed a PA and another time by insurance that they were not going to pay for it.   Denies depressed mood. She reports that she has been more easily fatigued with recurrent URI's. She reports that motivation is fair. She has to push herself to go to work. Has not  been missing work due to low motivation.  Denies any manic s/s. Anxiety has been manageable. Concentration was better with Adderall XR instead of IR. Denies SI.   She has been working 2 jobs right now.   Past Psychiatric Medication Trials: Xanax- Misused Klonopin Seroquel- Helpful for sleep and anxiety. Olanzapine Geodon Abilify Lithium Lamictal Depakote Buspar Hydroxyzine Gabapentin Prozac- Hives, edema Celexa- Possible allergic reaction Paxil Wellbutrin XL Sonata Ambien Zolpimist Lunesta Trazodone Adderall-Not as effective as XR Adderall XR- Well tolerated and effective Vyvanse- Well tolerated  Review of Systems:  Review of Systems  HENT:  Positive for congestion.   Respiratory:  Positive for cough.   Cardiovascular:  Negative for palpitations.  Musculoskeletal:  Negative for gait problem.  Neurological:  Positive for weakness.  Psychiatric/Behavioral:         Please refer to HPI  Reports recurrent URI's.   Medications: I have reviewed the patient's current medications.  Current Outpatient Medications  Medication Sig Dispense Refill   albuterol (VENTOLIN HFA) 108 (90 Base) MCG/ACT inhaler Inhale 2 puffs into the lungs every 6 (six) hours as needed for wheezing or shortness of breath. 18 g 3   amphetamine-dextroamphetamine (ADDERALL) 20 MG tablet Take 1 tablet (20 mg total) by mouth 2 (two) times daily. 60 tablet 0   calcium carbonate (CALCIUM 600) 600 MG TABS tablet Take 1 tablet (600 mg total) by mouth daily with breakfast. 3 tablet    cariprazine (VRAYLAR) capsule Take 3 mg  by mouth daily.     cephALEXin (KEFLEX) 500 MG capsule Take 500 mg by mouth 3 (three) times daily.     Cholecalciferol (VITAMIN D) 50 MCG (2000 UT) CAPS Take 1 capsule (2,000 Units total) by mouth daily. (Patient taking differently: Take 2,000 Units by mouth daily.)     esomeprazole (NEXIUM) 40 MG capsule TAKE 1 CAPSULE(40 MG) BY MOUTH DAILY 90 capsule 0   metFORMIN (GLUCOPHAGE) 500 MG  tablet Take 1 tablet daily with a meal for one week, and then increase to 1 tablet twice daily with meals if tolerated 60 tablet 2   Multiple Vitamin (MULTIVITAMIN WITH MINERALS) TABS tablet Take 1 tablet by mouth daily.     Tiotropium Bromide-Olodaterol (STIOLTO RESPIMAT) 2.5-2.5 MCG/ACT AERS Inhale 2 puffs into the lungs daily. 1 each 3   amphetamine-dextroamphetamine (ADDERALL XR) 20 MG 24 hr capsule Take 2 capsules (40 mg total) by mouth daily. 60 capsule 0   amphetamine-dextroamphetamine (ADDERALL XR) 20 MG 24 hr capsule Take 2 capsules (40 mg total) by mouth daily. 60 capsule 0   amphetamine-dextroamphetamine (ADDERALL XR) 20 MG 24 hr capsule Take 2 capsules (40 mg total) by mouth daily. 60 capsule 0   buPROPion (WELLBUTRIN SR) 150 MG 12 hr tablet Take 1 tablet (150 mg total) by mouth 2 (two) times daily. (Patient not taking: Reported on 03/18/2022) 60 tablet 3   cholestyramine light (PREVALITE) 4 g packet Take 0.5 packets (2 g total) by mouth 2 (two) times daily. (Patient not taking: Reported on 03/18/2022) 30 packet 3   QUEtiapine (SEROQUEL) 200 MG tablet Take 4 tablets (800 mg total) by mouth at bedtime. 360 tablet 0   sucralfate (CARAFATE) 1 g tablet TAKE 1 TABLET(1 GRAM) BY MOUTH FOUR TIMES DAILY WITH MEALS AND AT BEDTIME AS NEEDED FOR UPSET STOMACH OR REFLUX (Patient not taking: Reported on 03/18/2022) 360 tablet 0   No current facility-administered medications for this visit.    Medication Side Effects: Other: Weight gain  Allergies:  Allergies  Allergen Reactions   Prozac [Fluoxetine Hcl] Swelling and Rash   Doxycycline Nausea And Vomiting   Celexa [Citalopram Hydrobromide] Other (See Comments)    Possible allergic reaction   Chantix [Varenicline Tartrate] Swelling    Mouth swelling   Hydroxyzine Other (See Comments)    Not effective   Klonopin [Clonazepam] Other (See Comments)    Caused instablility. Not able to drive due to over medicated   Lunesta [Eszopiclone] Other (See  Comments)    Not effective   Sonata [Zaleplon] Other (See Comments)    Not effective   Trazodone And Nefazodone Other (See Comments)    ineffective   Zolpimist [Zolpidem Tartrate] Other (See Comments)    Not effective   Azithromycin Other (See Comments)    Reaction not recalled by the patient    Past Medical History:  Diagnosis Date   Anemia    Anxiety    Barrett esophagus    Bipolar 1 disorder ([place])    sees Dr. Candis Schatz psych [phone])   Closed fracture distal radius and ulna, left, initial encounter 04/20/2019   S/p ORIF 04/2019 Jeannie Fend)    COPD (chronic obstructive pulmonary disease) ([place]) 03/2013   hyperinflation by CXR   Elevated alkaline phosphatase level    Esophagitis    Hiatal hernia    History of cocaine dependence ([place]) latest 2020   undergoing detox   History of stomach ulcers    Hx: UTI (urinary tract infection)    Migraine    "  stopped in ~ 2008" (03/11/2017)   Muscle strain of gluteal region, right, subsequent encounter 10/05/2018   By MRI 2020   Osteoporosis 06/09/2018   DEXA 05/2017 - T score -2.5 L hip, -1.3 spine   Positive PPD 1989   s/p CXR and INH 6 mo   Seizure ([place]) X 1   "from takiing too many headache medicine?" (03/11/2017)   Smoker     Family History  Problem Relation Age of Onset   Diabetes Mother        T2DM   Breast cancer Mother 34   Dementia Mother    Lymphoma Mother    Coronary artery disease Father 50       MI   CAD Father    ADD / ADHD Son    ADD / ADHD Son    ADD / ADHD Child    Anxiety disorder Child    Depression Child    Anxiety disorder Paternal Aunt    Suicidality Paternal Uncle    Depression Paternal Uncle    Anxiety disorder Cousin    Depression Cousin    Stroke Neg Hx    Colon cancer Neg Hx    Cancer - Colon Neg Hx    Esophageal cancer Neg Hx    Stomach cancer Neg Hx    Rectal cancer Neg Hx     Social History   Socioeconomic History   Marital status: Divorced    Spouse name: Not on file   Number of  children: 2   Years of education: bachelors   Highest education level: Not on file  Occupational History   Occupation: Product manager: [employer]    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, working on disability  Tobacco Use   Smoking status: Every Day    Packs/day: 1.00    Years: 36.00    Total pack years: 36.00    Types: Cigarettes   Smokeless tobacco: Never   Tobacco comments:    1 pack of cigarettes smoked daily 02/19/22 [facility]   Vaping Use   Vaping Use: Never used  Substance and Sexual Activity   Alcohol use: Not Currently    Alcohol/week: 0.0 standard drinks of alcohol    Comment: 03/11/2017 "stopped 09/18/2015; never an alcoholic"   Drug use: Not Currently    Comment: not currently   Sexual activity: Never    Birth control/protection: Surgical  Other Topics Concern   Not on file  Social History Narrative   Caffeine: 40 oz diet mountain dew/day   Lives with husband and 2 children, no pets      Social Determinants of Health   Financial Resource Strain: Not on file  Food Insecurity: Not on file  Transportation Needs: Not on file  Physical Activity: Not on file  Stress: Not on file  Social Connections: Not on file  Intimate Partner Violence: Not on file    Past Medical History, Surgical history, Social history, and Family history were reviewed and updated as appropriate.   Please see review of systems for further details on the patient's review from today.   Objective:   Physical Exam:  Pulse (!) 102   LMP 04/23/2015 (Approximate)   Physical Exam Neurological:     Mental Status: She is alert and oriented to person, place, and time.     Cranial Nerves: No dysarthria.  Psychiatric:        Attention and Perception: Attention and perception normal.  Mood and Affect: Mood normal.        Speech: Speech normal.        Behavior: Behavior is cooperative.        Thought Content: Thought content normal. Thought content is not paranoid or delusional.  Thought content does not include homicidal or suicidal ideation. Thought content does not include homicidal or suicidal plan.        Cognition and Memory: Cognition and memory normal.        Judgment: Judgment normal.     Comments: Insight intact     Lab Review:     Component Value Date/Time   NA 121 (LL) 04/03/2020 1006   K 3.6 04/03/2020 1006   CL 84 (L) 04/03/2020 1006   CO2 29 04/03/2020 1006   GLUCOSE 93 04/03/2020 1006   BUN 12 04/03/2020 1006   CREATININE 0.56 04/03/2020 1006   CALCIUM 8.2 (L) 04/03/2020 1006   PROT 6.1 04/03/2020 1006   ALBUMIN 3.1 (L) 04/03/2020 1006   AST 14 04/03/2020 1006   ALT 11 04/03/2020 1006   ALKPHOS 133 (H) 04/03/2020 1006   BILITOT 0.4 04/03/2020 1006   GFRNONAA >60 03/18/2020 0434   GFRAA >60 03/18/2020 0434       Component Value Date/Time   WBC 15.2 (H) 04/03/2020 1006   RBC 3.58 (L) 04/03/2020 1006   HGB 11.2 (L) 04/03/2020 1006   HGB 10.8 (L) 07/11/2018 1050   HCT 33.0 (L) 04/03/2020 1006   PLT 823.0 (H) 04/03/2020 1006   PLT 547 (H) 07/11/2018 1050   MCV 92.2 04/03/2020 1006   MCH 32.3 03/18/2020 0434   MCHC 34.0 04/03/2020 1006   RDW 14.2 04/03/2020 1006   LYMPHSABS 1.0 04/03/2020 1006   MONOABS 0.7 04/03/2020 1006   EOSABS 0.3 04/03/2020 1006   BASOSABS 0.2 (H) 04/03/2020 1006    Lithium Lvl  Date Value Ref Range Status  12/27/2010 1.43 (H) 0.80 - 1.40 mEq/L Final     No results found for: "PHENYTOIN", "PHENOBARB", "VALPROATE", "CBMZ"   .res Assessment: Plan:   Pt seen for 30 minutes and time spent reviewing treatment plan. She reports that her insurance is no longer approving Adderall XR and it is now cost-prohibitive. She reports that Adderall IR is not as effective as Adderall XR for concentration. Will request that clinic nurse contact her insurance to find out if Adderall XR is covered under her plan or requires prior authorization. Recommend trying Adderall 20 mg 1.5 tabs in the morning and 1/2 tab.  Has  upcoming physical exam in August. Requested she have labs sent to this office.  Consider Vyvanse if insurance will not cover Adderall XR.  Continue Seroquel 800 mg po QHS for mood, anxiety, and insomnia.  Discussed that atypical anti-psychotics can cause insulin resistance which can result in weight gain and difficulty losing weight. Discussed that some studies indicate that Metformin may be helpful with improving insulin resistance associated with atypical antipsychotics. Discussed potential benefits, risks, and side effects of Metformin and pt agrees to trial of Metformin for off-label indication for antipsychotic induced insulin resistance. Will start Metformin 500 mg daily with a meal for one week, then increase to 500 mg twice daily with meals.  She reports that Wellbutrin XL was prescribed by her pulmonologist for smoking cessation and she has not yet started it. Reviewed benefits, risks, and side effects of Wellbutrin SR and discussed that it is also used off-label to help with weight loss. She reports that she might  start Wellbutrin SR.  Pt to follow-up in 3 months or sooner if clinically indicated.  Patient advised to contact office with any questions, adverse effects, or acute worsening in signs and symptoms.   Annell was seen today for adhd and follow-up.  Diagnoses and all orders for this visit:  Attention deficit hyperactivity disorder (ADHD), combined type  Bipolar I disorder (HCC) -     QUEtiapine (SEROQUEL) 200 MG tablet; Take 4 tablets (800 mg total) by mouth at bedtime.  Insomnia due to mental disorder -     QUEtiapine (SEROQUEL) 200 MG tablet; Take 4 tablets (800 mg total) by mouth at bedtime.  Other orders -     metFORMIN (GLUCOPHAGE) 500 MG tablet; Take 1 tablet daily with a meal for one week, and then increase to 1 tablet twice daily with meals if tolerated     Please see After Visit Summary for patient specific instructions.  Future Appointments  Date Time Provider  [place]  04/15/2022  2:30 PM Ria Bush, MD [facility]  05/25/2022 10:00 AM Spero Geralds, MD [facility] None    No orders of the defined types were placed in this encounter.     -------------------------------

## 2022-03-18 NOTE — Telephone Encounter (Signed)
Prior Authorization submitted with [facility] [employer] for AMPHETAMINE-DEXTROAMPHETAMINE XR 20 MG #60, pending response.

## 2022-03-19 NOTE — Telephone Encounter (Signed)
Cost is $63.47, but last Rx was for 20 mg IR. Janett Billow wants her to stay on XR, but patient wants to take IR. Janett Billow will discuss at next appt.

## 2022-03-19 NOTE — Telephone Encounter (Signed)
Approval received for Amphetamine-dextroamphetamine XR 20 mg caps, effective through 08/30/2022 with [facility] Medicare part D.    Robin Welch can you also check with her pharmacy on the cost now since its approved. Thanks.

## 2022-03-31 ENCOUNTER — Ambulatory Visit (INDEPENDENT_AMBULATORY_CARE_PROVIDER_SITE_OTHER): Payer: Medicare PPO | Admitting: Internal Medicine

## 2022-03-31 DIAGNOSIS — Z9189 Other specified personal risk factors, not elsewhere classified: Secondary | ICD-10-CM

## 2022-03-31 DIAGNOSIS — J449 Chronic obstructive pulmonary disease, unspecified: Secondary | ICD-10-CM | POA: Diagnosis not present

## 2022-03-31 NOTE — Patient Instructions (Signed)
Full PFT performed today. °

## 2022-03-31 NOTE — Progress Notes (Signed)
Full PFT performed today. °

## 2022-04-02 ENCOUNTER — Telehealth: Payer: Self-pay | Admitting: Internal Medicine

## 2022-04-02 LAB — PULMONARY FUNCTION TEST
DL/VA % pred: 75 %
DL/VA: 3.24 ml/min/mmHg/L
DLCO cor % pred: 70 %
DLCO cor: 13.46 ml/min/mmHg
DLCO unc % pred: 70 %
DLCO unc: 13.46 ml/min/mmHg
FEF 25-75 Post: 1.36 L/sec
FEF 25-75 Pre: 1.31 L/sec
FEF2575-%Change-Post: 3 %
FEF2575-%Pred-Post: 58 %
FEF2575-%Pred-Pre: 56 %
FEV1-%Change-Post: 0 %
FEV1-%Pred-Post: 74 %
FEV1-%Pred-Pre: 73 %
FEV1-Post: 1.79 L
FEV1-Pre: 1.79 L
FEV1FVC-%Change-Post: 1 %
FEV1FVC-%Pred-Pre: 94 %
FEV6-%Change-Post: -1 %
FEV6-%Pred-Post: 78 %
FEV6-%Pred-Pre: 80 %
FEV6-Post: 2.38 L
FEV6-Pre: 2.42 L
FEV6FVC-%Change-Post: 0 %
FEV6FVC-%Pred-Post: 103 %
FEV6FVC-%Pred-Pre: 103 %
FVC-%Change-Post: -1 %
FVC-%Pred-Post: 76 %
FVC-%Pred-Pre: 77 %
FVC-Post: 2.39 L
FVC-Pre: 2.42 L
Post FEV1/FVC ratio: 75 %
Post FEV6/FVC ratio: 100 %
Pre FEV1/FVC ratio: 74 %
Pre FEV6/FVC Ratio: 100 %
RV % pred: 92 %
RV: 1.7 L
TLC % pred: 92 %
TLC: 4.39 L

## 2022-04-02 NOTE — Telephone Encounter (Signed)
Called and spoke to patient to let her know I will have print out for PFTs up at front for her. Nothing further needed

## 2022-04-02 NOTE — Telephone Encounter (Signed)
Checking with Lowella Bandy on PFT that was done 03/31/22

## 2022-04-15 ENCOUNTER — Encounter: Payer: Medicare PPO | Admitting: Family Medicine

## 2022-04-16 ENCOUNTER — Telehealth: Payer: Self-pay | Admitting: Psychiatry

## 2022-04-16 NOTE — Telephone Encounter (Signed)
[facility] [address] [facility]. Due over weekend. Requesting to send Rx for Generic adderall . Follow up due Oct.

## 2022-04-17 ENCOUNTER — Other Ambulatory Visit: Payer: Self-pay

## 2022-04-17 DIAGNOSIS — F902 Attention-deficit hyperactivity disorder, combined type: Secondary | ICD-10-CM

## 2022-04-17 NOTE — Telephone Encounter (Signed)
Pended.

## 2022-04-17 NOTE — Telephone Encounter (Signed)
Please contact [facility] [address] to verify if PDMP is correct and Adderall XR was dispensed on 03/19/22 and Adderall IR was dispensed on 03/16/22. If so, she received a 60-day supply of medication within those 3 days and should have medication remaining.

## 2022-04-20 ENCOUNTER — Telehealth: Payer: Self-pay

## 2022-04-20 ENCOUNTER — Telehealth: Payer: Self-pay | Admitting: Student

## 2022-04-20 DIAGNOSIS — F902 Attention-deficit hyperactivity disorder, combined type: Secondary | ICD-10-CM

## 2022-04-20 MED ORDER — AMPHETAMINE-DEXTROAMPHET ER 20 MG PO CP24: 40.0000 mg | ORAL_CAPSULE | Freq: Every day | ORAL | 0 refills | Status: DC

## 2022-04-20 NOTE — Telephone Encounter (Signed)
Pt informed

## 2022-04-20 NOTE — Telephone Encounter (Signed)
Please let her know script for Adderall XR was sent today since it is a change in treatment from IR back to XR and that in the future more than one stimulant will not be authorized in a month.

## 2022-04-20 NOTE — Telephone Encounter (Signed)
Sir   Are you ok with [place] giving patient a letter on stating why she can not blow into machine from [facility]. She states she can not do it due to her COPD.   I know in the past [facility] and [place] has suggested to the [facility] about adjusting the settings based on the spiro exam.   But what would you like for me to do sir  Please advise

## 2022-04-29 ENCOUNTER — Ambulatory Visit: Payer: Medicare PPO | Admitting: Student

## 2022-05-06 ENCOUNTER — Ambulatory Visit: Payer: Medicare PPO | Admitting: Internal Medicine

## 2022-05-15 ENCOUNTER — Telehealth: Payer: Self-pay | Admitting: Psychiatry

## 2022-05-15 NOTE — Telephone Encounter (Signed)
Pt called reporting Adderall Rx needs to go to [facility]. In stock now. RF date Sunday or Monday she said.

## 2022-05-15 NOTE — Telephone Encounter (Signed)
Last filled 8/21, due 9/18

## 2022-05-18 ENCOUNTER — Other Ambulatory Visit: Payer: Self-pay

## 2022-05-18 DIAGNOSIS — F902 Attention-deficit hyperactivity disorder, combined type: Secondary | ICD-10-CM

## 2022-05-18 MED ORDER — AMPHETAMINE-DEXTROAMPHET ER 20 MG PO CP24: 40.0000 mg | ORAL_CAPSULE | Freq: Every day | ORAL | 0 refills | Status: DC

## 2022-05-18 MED ORDER — AMPHETAMINE-DEXTROAMPHET ER 20 MG PO CP24
40.0000 mg | ORAL_CAPSULE | Freq: Every day | ORAL | 0 refills | Status: DC
Start: 2022-05-18 — End: 2022-07-06

## 2022-05-18 NOTE — Telephone Encounter (Signed)
Pended.

## 2022-05-25 ENCOUNTER — Ambulatory Visit: Payer: Medicare PPO | Admitting: Internal Medicine

## 2022-06-06 NOTE — Progress Notes (Deleted)
Synopsis: Referred for COPD and DMV breathalyzer evaluation by Ria Bush, MD  Subjective:   PATIENT ID: Robin Welch GENDER: female DOB: 1966-07-04, MRN: [id number]  No chief complaint on file.  56F with history of COPD seen by Dr. Shearon Stalls referred for COPD and DMV breathalyzer evaluation  She says that she had a blower on her car for 74 days. Was able to do it successfully up to that point. Went with another company and needs to blow 0.3L/sec for 6 seconds.   She says she has had 2 courses of prednisone this year for AECOPD. She says she is still using stiolto 2 puffs daily. She doesn't have a rescue inhaler because she says it costs $59 copay.   She is a Education officer, [employer].   Otherwise pertinent review of systems is negative.    Past Medical History:  Diagnosis Date   Anemia    Anxiety    Barrett esophagus    Bipolar 1 disorder ([place])    sees Dr. Candis Schatz psych [phone])   Closed fracture distal radius and ulna, left, initial encounter 04/20/2019   S/p ORIF 04/2019 Jeannie Fend)    COPD (chronic obstructive pulmonary disease) ([place]) 03/2013   hyperinflation by CXR   Elevated alkaline phosphatase level    Esophagitis    Hiatal hernia    History of cocaine dependence ([place]) latest 2020   undergoing detox   History of stomach ulcers    Hx: UTI (urinary tract infection)    Migraine    "stopped in ~ 2008" (03/11/2017)   Muscle strain of gluteal region, right, subsequent encounter 10/05/2018   By MRI 2020   Osteoporosis 06/09/2018   DEXA 05/2017 - T score -2.5 L hip, -1.3 spine   Positive PPD 1989   s/p CXR and INH 6 mo   Seizure ([place]) X 1   "from takiing too many headache medicine?" (03/11/2017)   Smoker      Family History  Problem Relation Age of Onset   Diabetes Mother        T2DM   Breast cancer Mother 29   Dementia Mother    Lymphoma Mother    Coronary artery disease Father 44       MI   CAD Father    ADD / ADHD Son    ADD / ADHD Son    ADD / ADHD Child     Anxiety disorder Child    Depression Child    Anxiety disorder Paternal Aunt    Suicidality Paternal Uncle    Depression Paternal Uncle    Anxiety disorder Cousin    Depression Cousin    Stroke Neg Hx    Colon cancer Neg Hx    Cancer - Colon Neg Hx    Esophageal cancer Neg Hx    Stomach cancer Neg Hx    Rectal cancer Neg Hx      Past Surgical History:  Procedure Laterality Date   BIOPSY  07/19/2019   Procedure: BIOPSY;  Surgeon: Gatha Mayer, MD;  [facility]: [facility];  Service: Endoscopy;;   COLONOSCOPY  07/2018   poor colon prep - rec repeat (Nandigam)   DILATION AND CURETTAGE OF UTERUS  X 1   S/P miscarriage   ESOPHAGOGASTRODUODENOSCOPY  03/2017   candidal esophagitis, treated, LA grade D esophagitis, 4cm HH (Nandigam)   ESOPHAGOGASTRODUODENOSCOPY  07/2018   severe reflux esophagitis, neg candida (Nandigam)   ESOPHAGOGASTRODUODENOSCOPY  09/29/2019   oozing esophageal ulcers, benign appearing strictures (Nandigam)   ESOPHAGOGASTRODUODENOSCOPY  05/2020   grade D esophagitis, benign severe stenosis dilated, 7cm HH ([facility] Dr Mariea Clonts)   ESOPHAGOGASTRODUODENOSCOPY  10/2020   LA Grade D esophagitis, severe stenosis s/p dilation, 6cm HH ([facility] Dr Mariea Clonts)   ESOPHAGOGASTRODUODENOSCOPY (EGD) WITH PROPOFOL N/A 07/19/2019   Procedure: ESOPHAGOGASTRODUODENOSCOPY (EGD) WITH PROPOFOL;  Surgeon: Gatha Mayer, MD;  [facility];  Service: Endoscopy;  Laterality: N/A;   ESOPHAGOGASTRODUODENOSCOPY (EGD) WITH PROPOFOL N/A 10/24/2019   Procedure: ESOPHAGOGASTRODUODENOSCOPY (EGD) WITH PROPOFOL;  Surgeon: Mauri Pole, MD;  [facility];  Service: Endoscopy;  Laterality: N/A;   ESOPHAGOGASTRODUODENOSCOPY (EGD) WITH PROPOFOL N/A 11/21/2019   Procedure: ESOPHAGOGASTRODUODENOSCOPY (EGD) WITH PROPOFOL;  Surgeon: Mauri Pole, MD;  [facility];  Service: Endoscopy;  Laterality: N/A;   LAPAROSCOPIC CHOLECYSTECTOMY  2006   ORIF WRIST FRACTURE Right 04/08/2019    Procedure: OPEN REDUCTION INTERNAL FIXATION (ORIF) DISTAL RADIUS/ULNA FRACTURE;  Surgeon: Verner Mould, MD;  Location: [facility] [place];  Service: Orthopedics;  Laterality: Right;   TUBAL LIGATION  2003    Social History   Socioeconomic History   Marital status: Divorced    Spouse name: Not on file   Number of children: 2   Years of education: bachelors   Highest education level: Not on file  Occupational History   Occupation: Product manager: [employer]    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, working on disability  Tobacco Use   Smoking status: Every Day    Packs/day: 1.00    Years: 36.00    Total pack years: 36.00    Types: Cigarettes   Smokeless tobacco: Never   Tobacco comments:    1 pack of cigarettes smoked daily 02/19/22 ARJ   Vaping Use   Vaping Use: Never used  Substance and Sexual Activity   Alcohol use: Not Currently    Alcohol/week: 0.0 standard drinks of alcohol    Comment: 03/11/2017 "stopped 09/18/2015; never an alcoholic"   Drug use: Not Currently    Comment: not currently   Sexual activity: Never    Birth control/protection: Surgical  Other Topics Concern   Not on file  Social History Narrative   Caffeine: 40 oz diet mountain dew/day   Lives with husband and 2 children, no pets      Social Determinants of Health   Financial Resource Strain: Not on file  Food Insecurity: Not on file  Transportation Needs: Not on file  Physical Activity: Not on file  Stress: Not on file  Social Connections: Not on file  Intimate Partner Violence: Not on file     Allergies  Allergen Reactions   Prozac [Fluoxetine Hcl] Swelling and Rash   Doxycycline Nausea And Vomiting   Celexa [Citalopram Hydrobromide] Other (See Comments)    Possible allergic reaction   Chantix [Varenicline Tartrate] Swelling    Mouth swelling   Hydroxyzine Other (See Comments)    Not effective   Klonopin [Clonazepam] Other (See Comments)    Caused instablility. Not able to  drive due to over medicated   Lunesta [Eszopiclone] Other (See Comments)    Not effective   Sonata [Zaleplon] Other (See Comments)    Not effective   Trazodone And Nefazodone Other (See Comments)    ineffective   Zolpimist [Zolpidem Tartrate] Other (See Comments)    Not effective   Azithromycin Other (See Comments)    Reaction not recalled by the patient     Outpatient Medications Prior to Visit  Medication Sig Dispense Refill  albuterol (VENTOLIN HFA) 108 (90 Base) MCG/ACT inhaler Inhale 2 puffs into the lungs every 6 (six) hours as needed for wheezing or shortness of breath. 18 g 3   amphetamine-dextroamphetamine (ADDERALL XR) 20 MG 24 hr capsule Take 2 capsules (40 mg total) by mouth daily. 60 capsule 0   [START ON 06/15/2022] amphetamine-dextroamphetamine (ADDERALL XR) 20 MG 24 hr capsule Take 2 capsules (40 mg total) by mouth daily. 60 capsule 0   [START ON 07/13/2022] amphetamine-dextroamphetamine (ADDERALL XR) 20 MG 24 hr capsule Take 2 capsules (40 mg total) by mouth daily. 60 capsule 0   amphetamine-dextroamphetamine (ADDERALL) 20 MG tablet Take 1 tablet (20 mg total) by mouth 2 (two) times daily. 60 tablet 0   buPROPion (WELLBUTRIN SR) 150 MG 12 hr tablet Take 1 tablet (150 mg total) by mouth 2 (two) times daily. (Patient not taking: Reported on 03/18/2022) 60 tablet 3   calcium carbonate (CALCIUM 600) 600 MG TABS tablet Take 1 tablet (600 mg total) by mouth daily with breakfast. 3 tablet    cariprazine (VRAYLAR) capsule Take 3 mg by mouth daily.     cephALEXin (KEFLEX) 500 MG capsule Take 500 mg by mouth 3 (three) times daily.     Cholecalciferol (VITAMIN D) 50 MCG (2000 UT) CAPS Take 1 capsule (2,000 Units total) by mouth daily. (Patient taking differently: Take 2,000 Units by mouth daily.)     cholestyramine light (PREVALITE) 4 g packet Take 0.5 packets (2 g total) by mouth 2 (two) times daily. (Patient not taking: Reported on 03/18/2022) 30 packet 3   esomeprazole (NEXIUM) 40  MG capsule TAKE 1 CAPSULE(40 MG) BY MOUTH DAILY 90 capsule 0   metFORMIN (GLUCOPHAGE) 500 MG tablet Take 1 tablet daily with a meal for one week, and then increase to 1 tablet twice daily with meals if tolerated 60 tablet 2   Multiple Vitamin (MULTIVITAMIN WITH MINERALS) TABS tablet Take 1 tablet by mouth daily.     QUEtiapine (SEROQUEL) 200 MG tablet Take 4 tablets (800 mg total) by mouth at bedtime. 360 tablet 0   sucralfate (CARAFATE) 1 g tablet TAKE 1 TABLET(1 GRAM) BY MOUTH FOUR TIMES DAILY WITH MEALS AND AT BEDTIME AS NEEDED FOR UPSET STOMACH OR REFLUX (Patient not taking: Reported on 03/18/2022) 360 tablet 0   Tiotropium Bromide-Olodaterol (STIOLTO RESPIMAT) 2.5-2.5 MCG/ACT AERS Inhale 2 puffs into the lungs daily. 1 each 3   No facility-administered medications prior to visit.       Objective:   Physical Exam:  General appearance: 56 y.o., female, NAD, conversant  Eyes: anicteric sclerae; PERRL, tracking appropriately HENT: NCAT; MMM Neck: Trachea midline; no lymphadenopathy, no JVD Lungs: Rhonchi bl, with normal respiratory effort CV: RRR, no murmur  Abdomen: Soft, non-tender; non-distended, BS present  Extremities: No peripheral edema, warm Skin: Normal turgor and texture; no rash Psych: Appropriate affect Neuro: Alert and oriented to person and place, no focal deficit     There were no vitals filed for this visit.    on RA BMI Readings from Last 3 Encounters:  02/19/22 25.42 kg/m  02/18/22 25.42 kg/m  02/10/22 25.28 kg/m   Wt Readings from Last 3 Encounters:  02/19/22 139 lb (63 kg)  02/18/22 139 lb (63 kg)  02/10/22 138 lb 3.2 oz (62.7 kg)     CBC    Component Value Date/Time   WBC 15.2 (H) 04/03/2020 1006   RBC 3.58 (L) 04/03/2020 1006   HGB 11.2 (L) 04/03/2020 1006   HGB 10.8 (L)  07/11/2018 1050   HCT 33.0 (L) 04/03/2020 1006   PLT 823.0 (H) 04/03/2020 1006   PLT 547 (H) 07/11/2018 1050   MCV 92.2 04/03/2020 1006   MCH 32.3 03/18/2020 0434    MCHC 34.0 04/03/2020 1006   RDW 14.2 04/03/2020 1006   LYMPHSABS 1.0 04/03/2020 1006   MONOABS 0.7 04/03/2020 1006   EOSABS 0.3 04/03/2020 1006   BASOSABS 0.2 (H) 04/03/2020 1006     Chest Imaging: CTA Chest 03/17/20 reviewed by me with multifocal pneumonia, emphysema and bronchial wall thickening   Pulmonary Functions Testing Results:    Latest Ref Rng & Units 03/31/2022    2:49 PM 02/11/2022    2:31 PM  PFT Results  FVC-Pre L 2.42  2.58   FVC-Predicted Pre % 77  82   FVC-Post L 2.39  2.50   FVC-Predicted Post % 76  80   Pre FEV1/FVC % % 74  73   Post FEV1/FCV % % 75  75   FEV1-Pre L 1.79  1.87   FEV1-Predicted Pre % 73  77   FEV1-Post L 1.79  1.88   DLCO uncorrected ml/min/mmHg 13.46  12.58   DLCO UNC% % 70  66   DLCO corrected ml/min/mmHg 13.46  12.58   DLCO COR %Predicted % 70  66   DLVA Predicted % 75  72   TLC L 4.39  4.30   TLC % Predicted % 92  90   RV % Predicted % 92  79    Reviewed by me remarkable for mild obstruction, FEV1 1.88L, 77% pred, mildly reduced diffusing capacity       Assessment & Plan:   # COPD Gold Group E  # Gold Grade I ventilatory defect, mild obstruction with mildly reduced diffusing capacity # Emphysema Frequent exacerbations yet too early to assess whether or not stiolto is effective for her - she's only been on it a little over a week.  Plan: - paperwork signed requesting reduced lung capacity interlock device on basis of her frequent exacerbations - albuterol (ventolin) with goodrx is cheaper ~$22 - keep using stiolto 2 puffs daily  - can set up another appointment with Dr. Shearon Stalls for second provider to sign her DMV paperwork if she wishes   Maryjane Hurter, MD [facility] 06/06/2022 12:18 PM

## 2022-06-10 ENCOUNTER — Ambulatory Visit: Payer: Medicare PPO | Admitting: Student

## 2022-06-10 DIAGNOSIS — R81 Glycosuria: Secondary | ICD-10-CM | POA: Diagnosis not present

## 2022-06-10 DIAGNOSIS — R399 Unspecified symptoms and signs involving the genitourinary system: Secondary | ICD-10-CM | POA: Diagnosis not present

## 2022-06-10 DIAGNOSIS — N3000 Acute cystitis without hematuria: Secondary | ICD-10-CM | POA: Diagnosis not present

## 2022-06-11 ENCOUNTER — Telehealth: Payer: Self-pay | Admitting: Psychiatry

## 2022-06-11 NOTE — Telephone Encounter (Signed)
Robin Welch is requesting an RX for Adderall 20 mg XR called to:  [place], [place]  Phone:  [phone]  Fax:  [phone]    It is in stock per patient.

## 2022-06-11 NOTE — Telephone Encounter (Signed)
Filled 9/18 due 10/16

## 2022-06-15 ENCOUNTER — Other Ambulatory Visit: Payer: Self-pay

## 2022-06-15 DIAGNOSIS — F902 Attention-deficit hyperactivity disorder, combined type: Secondary | ICD-10-CM

## 2022-06-15 MED ORDER — AMPHETAMINE-DEXTROAMPHET ER 20 MG PO CP24: 40.0000 mg | ORAL_CAPSULE | Freq: Every day | ORAL | 0 refills | Status: DC

## 2022-06-15 NOTE — Telephone Encounter (Signed)
Pended please schedule appt

## 2022-06-25 ENCOUNTER — Ambulatory Visit: Payer: Medicare PPO | Admitting: Psychiatry

## 2022-06-29 ENCOUNTER — Other Ambulatory Visit: Payer: Self-pay | Admitting: Psychiatry

## 2022-07-02 DIAGNOSIS — J441 Chronic obstructive pulmonary disease with (acute) exacerbation: Secondary | ICD-10-CM | POA: Diagnosis not present

## 2022-07-02 NOTE — Progress Notes (Deleted)
Synopsis: Referred for COPD and DMV breathalyzer evaluation by Ria Bush, MD  Subjective:   PATIENT ID: Robin Welch GENDER: female DOB: May 12, 1966, MRN: [id number]  No chief complaint on file.  [name] with history of COPD seen by Dr. Shearon Stalls referred for COPD and DMV breathalyzer evaluation  She says that she had a blower on her car for 74 days. Was able to do it successfully up to that point. Went with another company and needs to blow 0.3L/sec for 6 seconds.   She says she has had 2 courses of prednisone this year for AECOPD. She says she is still using stiolto 2 puffs daily. She doesn't have a rescue inhaler because she says it costs $59 copay.   She is a Education officer, [employer].   Interval HPI  AECOPD 07/02/22 seen at urgent care.  Otherwise pertinent review of systems is negative.  Past Medical History:  Diagnosis Date   Anemia    Anxiety    Barrett esophagus    Bipolar 1 disorder ([place])    sees Dr. Candis Schatz psych [phone])   Closed fracture distal radius and ulna, left, initial encounter 04/20/2019   S/p ORIF 04/2019 Jeannie Fend)    COPD (chronic obstructive pulmonary disease) ([place]) 03/2013   hyperinflation by CXR   Elevated alkaline phosphatase level    Esophagitis    Hiatal hernia    History of cocaine dependence ([place]) latest 2020   undergoing detox   History of stomach ulcers    Hx: UTI (urinary tract infection)    Migraine    "stopped in ~ 2008" (03/11/2017)   Muscle strain of gluteal region, right, subsequent encounter 10/05/2018   By MRI 2020   Osteoporosis 06/09/2018   DEXA 05/2017 - T score -2.5 L hip, -1.3 spine   Positive PPD 1989   s/p CXR and INH 6 mo   Seizure ([place]) X 1   "from takiing too many headache medicine?" (03/11/2017)   Smoker      Family History  Problem Relation Age of Onset   Diabetes Mother        T2DM   Breast cancer Mother 79   Dementia Mother    Lymphoma Mother    Coronary artery disease Father 36       MI   CAD Father    ADD /  ADHD Son    ADD / ADHD Son    ADD / ADHD Child    Anxiety disorder Child    Depression Child    Anxiety disorder Paternal Aunt    Suicidality Paternal Uncle    Depression Paternal Uncle    Anxiety disorder Cousin    Depression Cousin    Stroke Neg Hx    Colon cancer Neg Hx    Cancer - Colon Neg Hx    Esophageal cancer Neg Hx    Stomach cancer Neg Hx    Rectal cancer Neg Hx      Past Surgical History:  Procedure Laterality Date   BIOPSY  07/19/2019   Procedure: BIOPSY;  Surgeon: Gatha Mayer, MD;  [facility];  Service: Endoscopy;;   COLONOSCOPY  07/2018   poor colon prep - rec repeat (Nandigam)   DILATION AND CURETTAGE OF UTERUS  X 1   S/P miscarriage   ESOPHAGOGASTRODUODENOSCOPY  03/2017   candidal esophagitis, treated, LA grade D esophagitis, 4cm HH (Nandigam)   ESOPHAGOGASTRODUODENOSCOPY  07/2018   severe reflux esophagitis, neg candida (Nandigam)   ESOPHAGOGASTRODUODENOSCOPY  09/29/2019   oozing esophageal  ulcers, benign appearing strictures (Nandigam)   ESOPHAGOGASTRODUODENOSCOPY  05/2020   grade D esophagitis, benign severe stenosis dilated, 7cm HH ([facility] Dr Mariea Clonts)   ESOPHAGOGASTRODUODENOSCOPY  10/2020   LA Grade D esophagitis, severe stenosis s/p dilation, 6cm HH ([facility] Dr Mariea Clonts)   ESOPHAGOGASTRODUODENOSCOPY (EGD) WITH PROPOFOL N/A 07/19/2019   Procedure: ESOPHAGOGASTRODUODENOSCOPY (EGD) WITH PROPOFOL;  Surgeon: Gatha Mayer, MD;  [facility];  Service: Endoscopy;  Laterality: N/A;   ESOPHAGOGASTRODUODENOSCOPY (EGD) WITH PROPOFOL N/A 10/24/2019   Procedure: ESOPHAGOGASTRODUODENOSCOPY (EGD) WITH PROPOFOL;  Surgeon: Mauri Pole, MD;  [facility];  Service: Endoscopy;  Laterality: N/A;   ESOPHAGOGASTRODUODENOSCOPY (EGD) WITH PROPOFOL N/A 11/21/2019   Procedure: ESOPHAGOGASTRODUODENOSCOPY (EGD) WITH PROPOFOL;  Surgeon: Mauri Pole, MD;  [facility];  Service: Endoscopy;  Laterality: N/A;   LAPAROSCOPIC  CHOLECYSTECTOMY  2006   ORIF WRIST FRACTURE Right 04/08/2019   Procedure: OPEN REDUCTION INTERNAL FIXATION (ORIF) DISTAL RADIUS/ULNA FRACTURE;  Surgeon: Verner Mould, MD;  Location: [facility];  Service: Orthopedics;  Laterality: Right;   TUBAL LIGATION  2003    Social History   Socioeconomic History   Marital status: Divorced    Spouse name: Not on file   Number of children: 2   Years of education: bachelors   Highest education level: Not on file  Occupational History   Occupation: Product manager: [employer]    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, working on disability  Tobacco Use   Smoking status: Every Day    Packs/day: 1.00    Years: 36.00    Total pack years: 36.00    Types: Cigarettes   Smokeless tobacco: Never   Tobacco comments:    1 pack of cigarettes smoked daily 02/19/22 ARJ   Vaping Use   Vaping Use: Never used  Substance and Sexual Activity   Alcohol use: Not Currently    Alcohol/week: 0.0 standard drinks of alcohol    Comment: 03/11/2017 "stopped 09/18/2015; never an alcoholic"   Drug use: Not Currently    Comment: not currently   Sexual activity: Never    Birth control/protection: Surgical  Other Topics Concern   Not on file  Social History Narrative   Caffeine: 40 oz diet mountain dew/day   Lives with husband and 2 children, no pets      Social Determinants of Health   Financial Resource Strain: Not on file  Food Insecurity: Not on file  Transportation Needs: Not on file  Physical Activity: Not on file  Stress: Not on file  Social Connections: Not on file  Intimate Partner Violence: Not on file     Allergies  Allergen Reactions   Prozac [Fluoxetine Hcl] Swelling and Rash   Doxycycline Nausea And Vomiting   Celexa [Citalopram Hydrobromide] Other (See Comments)    Possible allergic reaction   Chantix [Varenicline Tartrate] Swelling    Mouth swelling   Hydroxyzine Other (See Comments)    Not effective   Klonopin [Clonazepam]  Other (See Comments)    Caused instablility. Not able to drive due to over medicated   Lunesta [Eszopiclone] Other (See Comments)    Not effective   Sonata [Zaleplon] Other (See Comments)    Not effective   Trazodone And Nefazodone Other (See Comments)    ineffective   Zolpimist [Zolpidem Tartrate] Other (See Comments)    Not effective   Azithromycin Other (See Comments)    Reaction not recalled by the patient     Outpatient Medications Prior  to Visit  Medication Sig Dispense Refill   albuterol (VENTOLIN HFA) 108 (90 Base) MCG/ACT inhaler Inhale 2 puffs into the lungs every 6 (six) hours as needed for wheezing or shortness of breath. 18 g 3   amphetamine-dextroamphetamine (ADDERALL XR) 20 MG 24 hr capsule Take 2 capsules (40 mg total) by mouth daily. 60 capsule 0   [START ON 07/13/2022] amphetamine-dextroamphetamine (ADDERALL XR) 20 MG 24 hr capsule Take 2 capsules (40 mg total) by mouth daily. 60 capsule 0   amphetamine-dextroamphetamine (ADDERALL XR) 20 MG 24 hr capsule Take 2 capsules (40 mg total) by mouth daily. 60 capsule 0   amphetamine-dextroamphetamine (ADDERALL) 20 MG tablet Take 1 tablet (20 mg total) by mouth 2 (two) times daily. 60 tablet 0   buPROPion (WELLBUTRIN SR) 150 MG 12 hr tablet Take 1 tablet (150 mg total) by mouth 2 (two) times daily. (Patient not taking: Reported on 03/18/2022) 60 tablet 3   calcium carbonate (CALCIUM 600) 600 MG TABS tablet Take 1 tablet (600 mg total) by mouth daily with breakfast. 3 tablet    cariprazine (VRAYLAR) capsule Take 3 mg by mouth daily.     cephALEXin (KEFLEX) 500 MG capsule Take 500 mg by mouth 3 (three) times daily.     Cholecalciferol (VITAMIN D) 50 MCG (2000 UT) CAPS Take 1 capsule (2,000 Units total) by mouth daily. (Patient taking differently: Take 2,000 Units by mouth daily.)     cholestyramine light (PREVALITE) 4 g packet Take 0.5 packets (2 g total) by mouth 2 (two) times daily. (Patient not taking: Reported on 03/18/2022) 30  packet 3   esomeprazole (NEXIUM) 40 MG capsule TAKE 1 CAPSULE(40 MG) BY MOUTH DAILY 90 capsule 0   metFORMIN (GLUCOPHAGE) 500 MG tablet TAKE 1 TABLET TWICE DAILY WITH MEALS IF TOLERATED 60 tablet 2   Multiple Vitamin (MULTIVITAMIN WITH MINERALS) TABS tablet Take 1 tablet by mouth daily.     QUEtiapine (SEROQUEL) 200 MG tablet Take 4 tablets (800 mg total) by mouth at bedtime. 360 tablet 0   sucralfate (CARAFATE) 1 g tablet TAKE 1 TABLET(1 GRAM) BY MOUTH FOUR TIMES DAILY WITH MEALS AND AT BEDTIME AS NEEDED FOR UPSET STOMACH OR REFLUX (Patient not taking: Reported on 03/18/2022) 360 tablet 0   Tiotropium Bromide-Olodaterol (STIOLTO RESPIMAT) 2.5-2.5 MCG/ACT AERS Inhale 2 puffs into the lungs daily. 1 each 3   No facility-administered medications prior to visit.       Objective:   Physical Exam:  General appearance: 56 y.o., female, NAD, conversant  Eyes: anicteric sclerae; PERRL, tracking appropriately HENT: NCAT; MMM Neck: Trachea midline; no lymphadenopathy, no JVD Lungs: Rhonchi bl, with normal respiratory effort CV: RRR, no murmur  Abdomen: Soft, non-tender; non-distended, BS present  Extremities: No peripheral edema, warm Skin: Normal turgor and texture; no rash Psych: Appropriate affect Neuro: Alert and oriented to person and place, no focal deficit     There were no vitals filed for this visit.    on RA BMI Readings from Last 3 Encounters:  02/19/22 25.42 kg/m  02/18/22 25.42 kg/m  02/10/22 25.28 kg/m   Wt Readings from Last 3 Encounters:  02/19/22 139 lb (63 kg)  02/18/22 139 lb (63 kg)  02/10/22 138 lb 3.2 oz (62.7 kg)     CBC    Component Value Date/Time   WBC 15.2 (H) 04/03/2020 1006   RBC 3.58 (L) 04/03/2020 1006   HGB 11.2 (L) 04/03/2020 1006   HGB 10.8 (L) 07/11/2018 1050   HCT 33.0 (L)  04/03/2020 1006   PLT 823.0 (H) 04/03/2020 1006   PLT 547 (H) 07/11/2018 1050   MCV 92.2 04/03/2020 1006   MCH 32.3 03/18/2020 0434   MCHC 34.0 04/03/2020 1006    RDW 14.2 04/03/2020 1006   LYMPHSABS 1.0 04/03/2020 1006   MONOABS 0.7 04/03/2020 1006   EOSABS 0.3 04/03/2020 1006   BASOSABS 0.2 (H) 04/03/2020 1006     Chest Imaging: CTA Chest 03/17/20 reviewed by me with multifocal pneumonia, emphysema and bronchial wall thickening   Pulmonary Functions Testing Results:    Latest Ref Rng & Units 03/31/2022    2:49 PM 02/11/2022    2:31 PM  PFT Results  FVC-Pre L 2.42  2.58   FVC-Predicted Pre % 77  82   FVC-Post L 2.39  2.50   FVC-Predicted Post % 76  80   Pre FEV1/FVC % % 74  73   Post FEV1/FCV % % 75  75   FEV1-Pre L 1.79  1.87   FEV1-Predicted Pre % 73  77   FEV1-Post L 1.79  1.88   DLCO uncorrected ml/min/mmHg 13.46  12.58   DLCO UNC% % 70  66   DLCO corrected ml/min/mmHg 13.46  12.58   DLCO COR %Predicted % 70  66   DLVA Predicted % 75  72   TLC L 4.39  4.30   TLC % Predicted % 92  90   RV % Predicted % 92  79    Reviewed by me remarkable for mild obstruction, FEV1 1.88L, 77% pred, mildly reduced diffusing capacity       Assessment & Plan:   # COPD Gold Group E  # Gold Grade I ventilatory defect, mild obstruction with mildly reduced diffusing capacity # Emphysema Frequent exacerbations yet too early to assess whether or not stiolto is effective for her - she's only been on it a little over a week.  Plan: - paperwork signed requesting reduced lung capacity interlock device on basis of her frequent exacerbations - albuterol (ventolin) with goodrx is cheaper ~$22 - keep using stiolto 2 puffs daily  - can set up another appointment with Dr. Shearon Stalls for second provider to sign her DMV paperwork if she wishes   Maryjane Hurter, MD [facility] 07/02/2022 3:26 PM

## 2022-07-06 ENCOUNTER — Encounter: Payer: Self-pay | Admitting: Psychiatry

## 2022-07-06 ENCOUNTER — Ambulatory Visit (INDEPENDENT_AMBULATORY_CARE_PROVIDER_SITE_OTHER): Payer: Medicare PPO | Admitting: Psychiatry

## 2022-07-06 DIAGNOSIS — F5105 Insomnia due to other mental disorder: Secondary | ICD-10-CM

## 2022-07-06 DIAGNOSIS — F319 Bipolar disorder, unspecified: Secondary | ICD-10-CM

## 2022-07-06 DIAGNOSIS — F902 Attention-deficit hyperactivity disorder, combined type: Secondary | ICD-10-CM

## 2022-07-06 MED ORDER — AMPHETAMINE-DEXTROAMPHET ER 20 MG PO CP24: 40.0000 mg | ORAL_CAPSULE | Freq: Every day | ORAL | 0 refills | Status: DC

## 2022-07-06 MED ORDER — QUETIAPINE FUMARATE 200 MG PO TABS: ORAL_TABLET | ORAL | 1 refills | Status: DC

## 2022-07-06 NOTE — Progress Notes (Signed)
[name] [id number] 12-Sep-1965 56 y.o.  Virtual Visit via Telephone Note  I connected with pt on 07/06/22 at 10:00 AM EST by telephone and verified that I am speaking with the correct person using two identifiers.   I discussed the limitations, risks, security and privacy concerns of performing an evaluation and management service by telephone and the availability of in person appointments. I also discussed with the patient that there may be a patient responsible charge related to this service. The patient expressed understanding and agreed to proceed.   I discussed the assessment and treatment plan with the patient. The patient was provided an opportunity to ask questions and all were answered. The patient agreed with the plan and demonstrated an understanding of the instructions.   The patient was advised to call back or seek an in-person evaluation if the symptoms worsen or if the condition fails to improve as anticipated.  I provided 21 minutes of non-face-to-face time during this encounter.  The patient was located at her family's home in [place], [place].  The provider was located at [facility].   Thayer Headings, PMHNP   Subjective:   Patient ID:  Robin Welch is a 56 y.o. (DOB 07/22/1966) female.  Chief Complaint:  Chief Complaint  Patient presents with   Medication Problem    Weight gain   Follow-up    Bipolar Disorder, anxiety, ADHD    HPI [name] presents for follow-up of ADHD, Anxiety, and mood disturbance. "Everything is going great." She reports that she was not able to tolerate Metformin and felt light headed and nightmares.  She reports, "the weight is better... I have been working on it." She reports that she is sleeping well after reducing Seroquel from 800 mg to 600 mg.  Denies anxiety. Denies depressed mood. She reports that she has to intentionally motivate herself. Energy is ok. Denies any manic s/s. Concentration has been adequate. She reports that  Adderall XR has been very effective compared to IR. Denies SI.   She has been working. She reports that she has difficulty working consecutive days. She reports that her preference is working Monday, Wednesday, and Friday.    She goes to [place] periodically to help with grandchild.   Adderall XR last filled 06/15/22. She reports that she notices a certain manufacturers work better than others.   She reports that she has not tried Wellbutrin due to concerns about possible worsening mood and anxiety. She plans to talk with pulmonologist about Chantix.   Past Psychiatric Medication Trials: Xanax- Misused Klonopin Seroquel- Helpful for sleep and anxiety. Olanzapine Geodon Abilify Lithium Lamictal Depakote Buspar Hydroxyzine Gabapentin Prozac- Hives, edema Celexa- Possible allergic reaction Paxil Wellbutrin XL Sonata Ambien Zolpimist Lunesta Trazodone Adderall-Not as effective as XR Adderall XR- Well tolerated and effective Vyvanse- Well tolerated    Review of Systems:  Review of Systems  Cardiovascular:  Negative for palpitations.  Musculoskeletal:  Negative for gait problem.  Neurological:  Negative for tremors.  Psychiatric/Behavioral:         Please refer to HPI    Medications: I have reviewed the patient's current medications.  Current Outpatient Medications  Medication Sig Dispense Refill   cefdinir (OMNICEF) 300 MG capsule Take by mouth.     Cyanocobalamin (VITAMIN B 12 PO) Take by mouth.     MAGNESIUM PO Take by mouth.     predniSONE (DELTASONE) 20 MG tablet Take by mouth.     albuterol (VENTOLIN HFA) 108 (90 Base) MCG/ACT inhaler Inhale 2  puffs into the lungs every 6 (six) hours as needed for wheezing or shortness of breath. 18 g 3   [START ON 07/13/2022] amphetamine-dextroamphetamine (ADDERALL XR) 20 MG 24 hr capsule Take 2 capsules (40 mg total) by mouth daily. 60 capsule 0   [START ON 08/10/2022] amphetamine-dextroamphetamine (ADDERALL XR) 20 MG 24  hr capsule Take 2 capsules (40 mg total) by mouth daily. 60 capsule 0   [START ON 09/07/2022] amphetamine-dextroamphetamine (ADDERALL XR) 20 MG 24 hr capsule Take 2 capsules (40 mg total) by mouth daily. 60 capsule 0   calcium carbonate (CALCIUM 600) 600 MG TABS tablet Take 1 tablet (600 mg total) by mouth daily with breakfast. 3 tablet    cariprazine (VRAYLAR) capsule Take 3 mg by mouth daily.     cephALEXin (KEFLEX) 500 MG capsule Take 500 mg by mouth 3 (three) times daily.     Cholecalciferol (VITAMIN D) 50 MCG (2000 UT) CAPS Take 1 capsule (2,000 Units total) by mouth daily. (Patient taking differently: Take 2,000 Units by mouth daily.)     cholestyramine light (PREVALITE) 4 g packet Take 0.5 packets (2 g total) by mouth 2 (two) times daily. (Patient not taking: Reported on 03/18/2022) 30 packet 3   esomeprazole (NEXIUM) 40 MG capsule TAKE 1 CAPSULE(40 MG) BY MOUTH DAILY 90 capsule 0   Multiple Vitamin (MULTIVITAMIN WITH MINERALS) TABS tablet Take 1 tablet by mouth daily.     QUEtiapine (SEROQUEL) 200 MG tablet Take 2-3 tabs at bedtime 180 tablet 1   sucralfate (CARAFATE) 1 g tablet TAKE 1 TABLET(1 GRAM) BY MOUTH FOUR TIMES DAILY WITH MEALS AND AT BEDTIME AS NEEDED FOR UPSET STOMACH OR REFLUX (Patient not taking: Reported on 03/18/2022) 360 tablet 0   Tiotropium Bromide-Olodaterol (STIOLTO RESPIMAT) 2.5-2.5 MCG/ACT AERS Inhale 2 puffs into the lungs daily. 1 each 3   No current facility-administered medications for this visit.    Medication Side Effects: None  Allergies:  Allergies  Allergen Reactions   Prozac [Fluoxetine Hcl] Swelling and Rash   Doxycycline Nausea And Vomiting   Celexa [Citalopram Hydrobromide] Other (See Comments)    Possible allergic reaction   Chantix [Varenicline Tartrate] Swelling    Mouth swelling   Hydroxyzine Other (See Comments)    Not effective   Klonopin [Clonazepam] Other (See Comments)    Caused instablility. Not able to drive due to over medicated    Lunesta [Eszopiclone] Other (See Comments)    Not effective   Sonata [Zaleplon] Other (See Comments)    Not effective   Trazodone And Nefazodone Other (See Comments)    ineffective   Zolpimist [Zolpidem Tartrate] Other (See Comments)    Not effective   Azithromycin Other (See Comments)    Reaction not recalled by the patient    Past Medical History:  Diagnosis Date   Anemia    Anxiety    Barrett esophagus    Bipolar 1 disorder ([place])    sees Dr. Candis Schatz psych [phone])   Closed fracture distal radius and ulna, left, initial encounter 04/20/2019   S/p ORIF 04/2019 Jeannie Fend)    COPD (chronic obstructive pulmonary disease) ([facility]) 03/2013   hyperinflation by CXR   Elevated alkaline phosphatase level    Esophagitis    Hiatal hernia    History of cocaine dependence ([place]) latest 2020   undergoing detox   History of stomach ulcers    Hx: UTI (urinary tract infection)    Migraine    "stopped in ~ 2008" (03/11/2017)   Muscle strain  of gluteal region, right, subsequent encounter 10/05/2018   By MRI 2020   Osteoporosis 06/09/2018   DEXA 05/2017 - T score -2.5 L hip, -1.3 spine   Positive PPD 1989   s/p CXR and INH 6 mo   Seizure ([place]) X 1   "from takiing too many headache medicine?" (03/11/2017)   Smoker     Family History  Problem Relation Age of Onset   Diabetes Mother        T2DM   Breast cancer Mother 57   Dementia Mother    Lymphoma Mother    Coronary artery disease Father 18       MI   CAD Father    ADD / ADHD Son    ADD / ADHD Son    ADD / ADHD Child    Anxiety disorder Child    Depression Child    Anxiety disorder Paternal Aunt    Suicidality Paternal Uncle    Depression Paternal Uncle    Anxiety disorder Cousin    Depression Cousin    Stroke Neg Hx    Colon cancer Neg Hx    Cancer - Colon Neg Hx    Esophageal cancer Neg Hx    Stomach cancer Neg Hx    Rectal cancer Neg Hx     Social History   Socioeconomic History   Marital status: Divorced     Spouse name: Not on file   Number of children: 2   Years of education: bachelors   Highest education level: Not on file  Occupational History   Occupation: Product manager: [employer]    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, working on disability  Tobacco Use   Smoking status: Every Day    Packs/day: 1.00    Years: 36.00    Total pack years: 36.00    Types: Cigarettes   Smokeless tobacco: Never   Tobacco comments:    1 pack of cigarettes smoked daily 02/19/22 [facility]   Vaping Use   Vaping Use: Never used  Substance and Sexual Activity   Alcohol use: Not Currently    Alcohol/week: 0.0 standard drinks of alcohol    Comment: 03/11/2017 "stopped 09/18/2015; never an alcoholic"   Drug use: Not Currently    Comment: not currently   Sexual activity: Never    Birth control/protection: Surgical  Other Topics Concern   Not on file  Social History Narrative   Caffeine: 40 oz diet mountain dew/day   Lives with husband and 2 children, no pets      Social Determinants of Health   Financial Resource Strain: Not on file  Food Insecurity: Not on file  Transportation Needs: Not on file  Physical Activity: Not on file  Stress: Not on file  Social Connections: Not on file  Intimate Partner Violence: Not on file    Past Medical History, Surgical history, Social history, and Family history were reviewed and updated as appropriate.   Please see review of systems for further details on the patient's review from today.   Objective:   Physical Exam:  BP 124/77   Pulse (!) 101   Wt 137 lb (62.1 kg)   LMP 04/23/2015 (Approximate)   BMI 25.06 kg/m   Physical Exam Neurological:     Mental Status: She is alert and oriented to person, place, and time.     Cranial Nerves: No dysarthria.  Psychiatric:        Attention and Perception: Attention and perception normal.  Mood and Affect: Mood normal.        Speech: Speech normal.        Behavior: Behavior is cooperative.         Thought Content: Thought content normal. Thought content is not paranoid or delusional. Thought content does not include homicidal or suicidal ideation. Thought content does not include homicidal or suicidal plan.        Cognition and Memory: Cognition and memory normal.        Judgment: Judgment normal.     Comments: Insight intact     Lab Review:     Component Value Date/Time   NA 121 (LL) 04/03/2020 1006   K 3.6 04/03/2020 1006   CL 84 (L) 04/03/2020 1006   CO2 29 04/03/2020 1006   GLUCOSE 93 04/03/2020 1006   BUN 12 04/03/2020 1006   CREATININE 0.56 04/03/2020 1006   CALCIUM 8.2 (L) 04/03/2020 1006   PROT 6.1 04/03/2020 1006   ALBUMIN 3.1 (L) 04/03/2020 1006   AST 14 04/03/2020 1006   ALT 11 04/03/2020 1006   ALKPHOS 133 (H) 04/03/2020 1006   BILITOT 0.4 04/03/2020 1006   GFRNONAA >60 03/18/2020 0434   GFRAA >60 03/18/2020 0434       Component Value Date/Time   WBC 15.2 (H) 04/03/2020 1006   RBC 3.58 (L) 04/03/2020 1006   HGB 11.2 (L) 04/03/2020 1006   HGB 10.8 (L) 07/11/2018 1050   HCT 33.0 (L) 04/03/2020 1006   PLT 823.0 (H) 04/03/2020 1006   PLT 547 (H) 07/11/2018 1050   MCV 92.2 04/03/2020 1006   MCH 32.3 03/18/2020 0434   MCHC 34.0 04/03/2020 1006   RDW 14.2 04/03/2020 1006   LYMPHSABS 1.0 04/03/2020 1006   MONOABS 0.7 04/03/2020 1006   EOSABS 0.3 04/03/2020 1006   BASOSABS 0.2 (H) 04/03/2020 1006    L[place]ium Lvl  Date Value Ref Range Status  12/27/2010 1.43 (H) 0.80 - 1.40 mEq/L Final     No results found for: "PHENYTOIN", "PHENOBARB", "VALPROATE", "CBMZ"   .res Assessment: Plan:    Discussed patient's concern about weight gain and possibly reducing Seroquel dosage further. She reports that she has reduced Seroquel from 800 mg to 600 mg QHS without experiencing any worsening insomnia, anxiety, or mood lability. Recommend decreasing Seroquel to 200 mg 2.5 tabs QHS x 2 weeks, and then reducing to two 200 mg tabs at bedtime to further reduce risk of  weight gain and excessive fatigue.  Pt reports that she did not start Wellbutrin XL due to concerns that it may have negative effects on her mood and/or anxiety. She reports that she took Chantix in the past and does not recall any adverse effects and thinks that Chantix may have been helpful for nicotine cravings in the past. She plans to discuss this with pulmonologist during apt this week. Advised pt to contact this provider if she experiences any worsening mood or anxiety s/s.  Continue Adderall XR 40 mg daily for ADHD.  Pt to follow-up in 3 months or sooner if clinically indicated.  Patient advised to contact office with any questions, adverse effects, or acute worsening in signs and symptoms.  Gearline was seen today for medication problem and follow-up.  Diagnoses and all orders for this visit:  Bipolar I disorder ([place]) -     QUEtiapine (SEROQUEL) 200 MG tablet; Take 2-3 tabs at bedtime  Insomnia due to mental disorder -     QUEtiapine (SEROQUEL) 200 MG tablet; Take 2-3 tabs  at bedtime  Attention deficit hyperactivity disorder (ADHD), combined type -     amphetamine-dextroamphetamine (ADDERALL XR) 20 MG 24 hr capsule; Take 2 capsules (40 mg total) by mouth daily. -     amphetamine-dextroamphetamine (ADDERALL XR) 20 MG 24 hr capsule; Take 2 capsules (40 mg total) by mouth daily.    Please see After Visit Summary for patient specific instructions.  Future Appointments  Date Time Provider [place]  07/08/2022  9:00 AM Maryjane Hurter, MD [facility] None    No orders of the defined types were placed in this encounter.     -------------------------------

## 2022-07-08 ENCOUNTER — Ambulatory Visit: Payer: Medicare PPO | Admitting: Student

## 2022-07-12 DIAGNOSIS — R051 Acute cough: Secondary | ICD-10-CM | POA: Diagnosis not present

## 2022-07-12 DIAGNOSIS — J441 Chronic obstructive pulmonary disease with (acute) exacerbation: Secondary | ICD-10-CM | POA: Diagnosis not present

## 2022-08-10 ENCOUNTER — Telehealth: Payer: Self-pay | Admitting: Physician Assistant

## 2022-08-10 NOTE — Telephone Encounter (Signed)
Pt called today about Adderall script. [facility] but went to [facility].  She has picked it up but it was higher priced there.  Pls change the script for Jan to show [facility].  No upcoming appts scheduled.

## 2022-08-27 NOTE — Telephone Encounter (Signed)
Filled 12/11 due 1/8

## 2022-09-02 ENCOUNTER — Other Ambulatory Visit: Payer: Self-pay

## 2022-09-02 ENCOUNTER — Telehealth: Payer: Self-pay | Admitting: Psychiatry

## 2022-09-02 DIAGNOSIS — F902 Attention-deficit hyperactivity disorder, combined type: Secondary | ICD-10-CM

## 2022-09-02 MED ORDER — AMPHETAMINE-DEXTROAMPHET ER 20 MG PO CP24: 40.0000 mg | ORAL_CAPSULE | Freq: Every day | ORAL | 0 refills | Status: DC

## 2022-09-02 NOTE — Telephone Encounter (Signed)
This has been sent

## 2022-09-02 NOTE — Telephone Encounter (Signed)
Per previous telephone encounter on 12/11, pls make sure the Jan script is sent to [facility],

## 2022-09-02 NOTE — Telephone Encounter (Signed)
This was sent to me by mistake

## 2022-09-03 ENCOUNTER — Telehealth: Payer: Self-pay | Admitting: Psychiatry

## 2022-09-03 NOTE — Telephone Encounter (Signed)
Received hand written letter from patient stating that Adderall is no longer as effective. She mentions that there was a "certain one that I know worked, but I can't remember the name of it" and is requesting a look back through her record to possibly find the name. Please call her to ask if she is referring to a generic manufacturer or a particular medication. Also, does she remember when that might have been? Please see if there are any telephone or office notes that mention what she may be referring to.

## 2022-09-03 NOTE — Telephone Encounter (Signed)
Patient went to the pharmacy and they filled based on the description she gave them. I told her if it worked to save her bottle so we could document the manufacturer for future refills.  We don't need to do anything further currently.

## 2022-09-03 NOTE — Telephone Encounter (Signed)
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 She said it was after she first started taking it. I see a note on a script sent in 2/22 to fill with [facility] only.

## 2022-09-03 NOTE — Telephone Encounter (Signed)
[place], thanks. An Adderall XR script was just sent in yesterday. Is she wanting [place] to call pharmacy to request [place] manufacturer or was this already filled?

## 2022-09-06 DIAGNOSIS — J0110 Acute frontal sinusitis, unspecified: Secondary | ICD-10-CM | POA: Diagnosis not present

## 2022-09-18 ENCOUNTER — Telehealth: Payer: Self-pay | Admitting: Psychiatry

## 2022-09-18 NOTE — Telephone Encounter (Signed)
Next appt is this coming Tuesday. Robin Welch would like to try Concerta again. Her pharmacy is:  [id number] [place], [place] - [address]  Phone: [phone] Fax: [phone]

## 2022-09-18 NOTE — Telephone Encounter (Signed)
Yes, needs to wait until apt to discuss and since Adderall was filled on 09/03/22.

## 2022-09-18 NOTE — Telephone Encounter (Signed)
Does pt need to wait until appt Tuesday to discuss?

## 2022-09-22 ENCOUNTER — Telehealth: Payer: Self-pay | Admitting: Psychiatry

## 2022-09-22 ENCOUNTER — Telehealth (INDEPENDENT_AMBULATORY_CARE_PROVIDER_SITE_OTHER): Payer: Medicare PPO | Admitting: Psychiatry

## 2022-09-22 ENCOUNTER — Other Ambulatory Visit: Payer: Self-pay | Admitting: Psychiatry

## 2022-09-22 ENCOUNTER — Encounter: Payer: Self-pay | Admitting: Psychiatry

## 2022-09-22 VITALS — BP 124/71 | HR 101 | Wt 140.0 lb

## 2022-09-22 DIAGNOSIS — F5105 Insomnia due to other mental disorder: Secondary | ICD-10-CM

## 2022-09-22 DIAGNOSIS — F319 Bipolar disorder, unspecified: Secondary | ICD-10-CM

## 2022-09-22 DIAGNOSIS — F902 Attention-deficit hyperactivity disorder, combined type: Secondary | ICD-10-CM

## 2022-09-22 MED ORDER — METHYLPHENIDATE HCL ER (OSM) 54 MG PO TBCR: 54.0000 mg | EXTENDED_RELEASE_TABLET | ORAL | 0 refills | Status: DC

## 2022-09-22 NOTE — Telephone Encounter (Signed)
Canceled at [facility].

## 2022-09-22 NOTE — Telephone Encounter (Signed)
Please send  generic concerta er 54 mg to [facility]. Jessica left before I could ask her.

## 2022-09-22 NOTE — Progress Notes (Signed)
Robin Welch [id number] [birth year] 57 y.o.  Virtual Visit via Video Note  I connected with pt @ on 09/22/22 at  8:30 AM EST by a video enabled telemedicine application and verified that I am speaking with the correct person using two identifiers.   I discussed the limitations of evaluation and management by telemedicine and the availability of in person appointments. The patient expressed understanding and agreed to proceed.  I discussed the assessment and treatment plan with the patient. The patient was provided an opportunity to ask questions and all were answered. The patient agreed with the plan and demonstrated an understanding of the instructions.   The patient was advised to call back or seek an in-person evaluation if the symptoms worsen or if the condition fails to improve as anticipated.  I provided 31 minutes of non-face-to-face time during this encounter.  The patient was located at home.  The provider was located at [place].   Thayer Headings, PMHNP   Subjective:   Patient ID:  Robin Welch is a 57 y.o. (DOB 1966-01-05) female.  Chief Complaint:  Chief Complaint  Patient presents with   ADHD   Depression    HPI Robin Welch presents for follow-up of ADHD, mood disturbance, anxiety, and insomnia. She reports, "I'm struggling." She reports, "I think my Adderall is not working at all." Pharmacy was not able to fill requested manufacturer of Adderall. She reports that she has diminished interest in things and low energy and motivation. Less interested in hygiene and grooming. Denies sad mood- "not sad, but not interested either." She reports that she spent most of the weekend in bed." She reports that her "stamina" is lower. Concentration is fair. She reports procrastination.  Denies anxiety. Denies irritability. Denies impulsive or risky behavior. Denies manic s/s. She reports that she will sleep "all day long" with Seroquel 800 mg. She reports that she sleeps well  with Seroquel 600 mg. She reports that she sleeps 8 hours a night with 400 mg and wakes up easier, however she notices her motivation is lower. Appetite has been good. Denies SI.   Denies substance use.   She is substitute teaching some days. Minimal social interaction. She reports that she went all weekend without speaking to anyone.    Past Psychiatric Medication Trials: Xanax- Misused Klonopin Ativan Seroquel- Helpful for sleep and anxiety. Olanzapine Geodon Abilify Lithium Lamictal Depakote Buspar Hydroxyzine Gabapentin Prozac- Hives, edema Celexa- Possible allergic reaction Paxil Wellbutrin XL Wellbutrin SR Remeron Sonata Ambien Zolpimist Lunesta Dayvigo Belsomra Trazodone Adderall-Not as effective as XR Adderall XR- Well tolerated and effective Vyvanse- Well tolerated Concerta-Effective  Review of Systems:  Review of Systems  HENT:         Recent sinus infection  Cardiovascular:  Negative for palpitations.  Musculoskeletal:  Negative for gait problem.  Neurological:  Negative for tremors.  Hematological:        She reports h/o severe anemia and has required infusions in the past.   Psychiatric/Behavioral:         Please refer to HPI  She reports that she is transferring care to a different PCP.   Medications: I have reviewed the patient's current medications.  Current Outpatient Medications  Medication Sig Dispense Refill   amphetamine-dextroamphetamine (ADDERALL XR) 20 MG 24 hr capsule Take 2 capsules (40 mg total) by mouth daily. 60 capsule 0   cariprazine (VRAYLAR) capsule Take 3 mg by mouth daily.     Cholecalciferol (VITAMIN D) 50 MCG (2000 UT) CAPS Take  1 capsule (2,000 Units total) by mouth daily. (Patient taking differently: Take 2,000 Units by mouth daily.)     Cyanocobalamin (VITAMIN B 12 PO) Take by mouth.     esomeprazole (NEXIUM) 40 MG capsule TAKE 1 CAPSULE(40 MG) BY MOUTH DAILY 90 capsule 0   MAGNESIUM PO Take by mouth.      methylphenidate (CONCERTA) 54 MG PO CR tablet Take 1 tablet (54 mg total) by mouth every morning. 30 tablet 0   Multiple Vitamin (MULTIVITAMIN WITH MINERALS) TABS tablet Take 1 tablet by mouth daily.     QUEtiapine (SEROQUEL) 200 MG tablet Take 2-3 tabs at bedtime 180 tablet 1   sucralfate (CARAFATE) 1 g tablet TAKE 1 TABLET(1 GRAM) BY MOUTH FOUR TIMES DAILY WITH MEALS AND AT BEDTIME AS NEEDED FOR UPSET STOMACH OR REFLUX 360 tablet 0   albuterol (VENTOLIN HFA) 108 (90 Base) MCG/ACT inhaler Inhale 2 puffs into the lungs every 6 (six) hours as needed for wheezing or shortness of breath. 18 g 3   amphetamine-dextroamphetamine (ADDERALL XR) 20 MG 24 hr capsule Take 2 capsules (40 mg total) by mouth daily. 60 capsule 0   amphetamine-dextroamphetamine (ADDERALL XR) 20 MG 24 hr capsule Take 2 capsules (40 mg total) by mouth daily. 60 capsule 0   calcium carbonate (CALCIUM 600) 600 MG TABS tablet Take 1 tablet (600 mg total) by mouth daily with breakfast. 3 tablet    cholestyramine light (PREVALITE) 4 g packet Take 0.5 packets (2 g total) by mouth 2 (two) times daily. (Patient not taking: Reported on 03/18/2022) 30 packet 3   Tiotropium Bromide-Olodaterol (STIOLTO RESPIMAT) 2.5-2.5 MCG/ACT AERS Inhale 2 puffs into the lungs daily. 1 each 3   No current facility-administered medications for this visit.    Medication Side Effects: None  Allergies:  Allergies  Allergen Reactions   Prozac [Fluoxetine Hcl] Swelling and Rash   Doxycycline Nausea And Vomiting   Celexa [Citalopram Hydrobromide] Other (See Comments)    Possible allergic reaction   Chantix [Varenicline Tartrate] Swelling    Mouth swelling   Hydroxyzine Other (See Comments)    Not effective   Klonopin [Clonazepam] Other (See Comments)    Caused instablility. Not able to drive due to over medicated   Lunesta [Eszopiclone] Other (See Comments)    Not effective   Sonata [Zaleplon] Other (See Comments)    Not effective   Trazodone And  Nefazodone Other (See Comments)    ineffective   Zolpimist [Zolpidem Tartrate] Other (See Comments)    Not effective   Azithromycin Other (See Comments)    Reaction not recalled by the patient    Past Medical History:  Diagnosis Date   Anemia    Anxiety    Barrett esophagus    Bipolar 1 disorder ([place])    sees Dr. Candis Schatz psych [phone])   Closed fracture distal radius and ulna, left, initial encounter 04/20/2019   S/p ORIF 04/2019 Jeannie Fend)    COPD (chronic obstructive pulmonary disease) ([place]) 03/2013   hyperinflation by CXR   Elevated alkaline phosphatase level    Esophagitis    Hiatal hernia    History of cocaine dependence ([place]) latest 2020   undergoing detox   History of stomach ulcers    Hx: UTI (urinary tract infection)    Migraine    "stopped in ~ 2008" (03/11/2017)   Muscle strain of gluteal region, right, subsequent encounter 10/05/2018   By MRI 2020   Osteoporosis 06/09/2018   DEXA 05/2017 - T score -2.5  L hip, -1.3 spine   Positive PPD 1989   s/p CXR and INH 6 mo   Seizure ([place]) X 1   "from takiing too many headache medicine?" (03/11/2017)   Smoker     Family History  Problem Relation Age of Onset   Diabetes Mother        T2DM   Breast cancer Mother 5   Dementia Mother    Lymphoma Mother    Coronary artery disease Father 69       MI   CAD Father    ADD / ADHD Son    ADD / ADHD Son    ADD / ADHD Child    Anxiety disorder Child    Depression Child    Anxiety disorder Paternal Aunt    Suicidality Paternal Uncle    Depression Paternal Uncle    Anxiety disorder Cousin    Depression Cousin    Stroke Neg Hx    Colon cancer Neg Hx    Cancer - Colon Neg Hx    Esophageal cancer Neg Hx    Stomach cancer Neg Hx    Rectal cancer Neg Hx     Social History   Socioeconomic History   Marital status: Divorced    Spouse name: Not on file   Number of children: 2   Years of education: bachelors   Highest education level: Not on file  Occupational  History   Occupation: Product manager: [employer]    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, working on disability  Tobacco Use   Smoking status: Every Day    Packs/day: 1.00    Years: 36.00    Total pack years: 36.00    Types: Cigarettes   Smokeless tobacco: Never   Tobacco comments:    1 pack of cigarettes smoked daily 02/19/22 [facility]   Vaping Use   Vaping Use: Never used  Substance and Sexual Activity   Alcohol use: Not Currently    Alcohol/week: 0.0 standard drinks of alcohol    Comment: 03/11/2017 "stopped 09/18/2015; never an alcoholic"   Drug use: Not Currently    Comment: not currently   Sexual activity: Never    Birth control/protection: Surgical  Other Topics Concern   Not on file  Social History Narrative   Caffeine: 40 oz diet mountain dew/day   Lives with husband and 2 children, no pets      Social Determinants of Health   Financial Resource Strain: Not on file  Food Insecurity: Not on file  Transportation Needs: Not on file  Physical Activity: Not on file  Stress: Not on file  Social Connections: Not on file  Intimate Partner Violence: Not on file    Past Medical History, Surgical history, Social history, and Family history were reviewed and updated as appropriate.   Please see review of systems for further details on the patient's review from today.   Objective:   Physical Exam:  BP 124/71   Pulse (!) 101   Wt 140 lb (63.5 kg)   LMP 04/23/2015 (Approximate)   BMI 25.61 kg/m   Physical Exam Neurological:     Mental Status: She is alert and oriented to person, place, and time.     Cranial Nerves: No dysarthria.  Psychiatric:        Attention and Perception: Attention and perception normal.        Mood and Affect: Mood is depressed.        Speech: Speech normal.  Behavior: Behavior is cooperative.        Thought Content: Thought content normal. Thought content is not paranoid or delusional. Thought content does not include  homicidal or suicidal ideation. Thought content does not include homicidal or suicidal plan.        Cognition and Memory: Cognition and memory normal.        Judgment: Judgment normal.     Comments: Insight intact     Lab Review:     Component Value Date/Time   NA 121 (LL) 04/03/2020 1006   K 3.6 04/03/2020 1006   CL 84 (L) 04/03/2020 1006   CO2 29 04/03/2020 1006   GLUCOSE 93 04/03/2020 1006   BUN 12 04/03/2020 1006   CREATININE 0.56 04/03/2020 1006   CALCIUM 8.2 (L) 04/03/2020 1006   PROT 6.1 04/03/2020 1006   ALBUMIN 3.1 (L) 04/03/2020 1006   AST 14 04/03/2020 1006   ALT 11 04/03/2020 1006   ALKPHOS 133 (H) 04/03/2020 1006   BILITOT 0.4 04/03/2020 1006   GFRNONAA >60 03/18/2020 0434   GFRAA >60 03/18/2020 0434       Component Value Date/Time   WBC 15.2 (H) 04/03/2020 1006   RBC 3.58 (L) 04/03/2020 1006   HGB 11.2 (L) 04/03/2020 1006   HGB 10.8 (L) 07/11/2018 1050   HCT 33.0 (L) 04/03/2020 1006   PLT 823.0 (H) 04/03/2020 1006   PLT 547 (H) 07/11/2018 1050   MCV 92.2 04/03/2020 1006   MCH 32.3 03/18/2020 0434   MCHC 34.0 04/03/2020 1006   RDW 14.2 04/03/2020 1006   LYMPHSABS 1.0 04/03/2020 1006   MONOABS 0.7 04/03/2020 1006   EOSABS 0.3 04/03/2020 1006   BASOSABS 0.2 (H) 04/03/2020 1006    Lithium Lvl  Date Value Ref Range Status  12/27/2010 1.43 (H) 0.80 - 1.40 mEq/L Final     No results found for: "PHENYTOIN", "PHENOBARB", "VALPROATE", "CBMZ"   .res Assessment: Plan:    Will discontinue Adderall since this no longer seems to be effective for her symptoms and generic manufacturer that was most effective for her has been unavailable.  Will start Concerta 54 mg daily since she reports that this worked well for her in the past. Also, she reports that Concerta would be affordable for her with Good Rx discount. Request sent to clinic nurse to initiate prior authorization for Adzenys XR in the event that Concerta is ineffective or unavailable. Discussed that  Adzenys may be an appropriate alternative to Adderall since it is pharmacologically similar and has brand name potency standards. Continue Vraylar 3 mg daily for mood symptoms. She reports that she has had difficulty completing Pt assistance renewal forms due to low energy and motivation and has less than a week supply remaining. Vraylar samples pulled for her to pick up when she comes to office to submit Pt Assistance forms.  Continue Seroquel 600 mg at bedtime for mood stabilization and anxiety.  Pt to follow-up with this provider in 4 weeks or sooner if clinically indicated.  Patient advised to contact office with any questions, adverse effects, or acute worsening in signs and symptoms.   Robin Welch was seen today for adhd and depression.  Diagnoses and all orders for this visit:  Attention deficit hyperactivity disorder (ADHD), combined type -     methylphenidate (CONCERTA) 54 MG PO CR tablet; Take 1 tablet (54 mg total) by mouth every morning.  Bipolar I disorder ([place])  Insomnia due to mental disorder     Please see After Visit  Summary for patient specific instructions.  No future appointments.   No orders of the defined types were placed in this encounter.     -------------------------------

## 2022-09-22 NOTE — Telephone Encounter (Signed)
Script sent. Please cancel script that was sent to [facility].

## 2022-09-24 ENCOUNTER — Telehealth: Payer: Self-pay

## 2022-09-24 NOTE — Telephone Encounter (Signed)
Prior Approval received for Adzenys XR-ODT with [employer] effective 08/31/2022-08/31/2023, PA# [id number]

## 2022-09-24 NOTE — Telephone Encounter (Signed)
Prior Authorization initiated for Adzenys XR ODT 15.7 mg with [facility],

## 2022-09-25 NOTE — Telephone Encounter (Signed)
Noted  

## 2022-10-01 ENCOUNTER — Telehealth: Payer: Self-pay | Admitting: Psychiatry

## 2022-10-01 NOTE — Telephone Encounter (Signed)
Samples pulled and patient notified. Encouraged her to complete pt. Assistance and she said "will do."

## 2022-10-01 NOTE — Telephone Encounter (Signed)
Ar[facility] called at 11:56 to make her follow up appt for 2/12.  She also requested samples of Vraylar.  She is on Pt. Assistance for the medication for she hasn't sent in her new application form for 2449.  She needs samples to carry her over until she reapplies.  She needs to be encouraged to get her application in.  Samples can't be given out continuously in lieu of the pt. Assistance.

## 2022-10-12 ENCOUNTER — Telehealth (INDEPENDENT_AMBULATORY_CARE_PROVIDER_SITE_OTHER): Payer: Medicare PPO | Admitting: Psychiatry

## 2022-10-12 ENCOUNTER — Encounter: Payer: Self-pay | Admitting: Psychiatry

## 2022-10-12 DIAGNOSIS — F319 Bipolar disorder, unspecified: Secondary | ICD-10-CM

## 2022-10-12 DIAGNOSIS — F5105 Insomnia due to other mental disorder: Secondary | ICD-10-CM

## 2022-10-12 DIAGNOSIS — F902 Attention-deficit hyperactivity disorder, combined type: Secondary | ICD-10-CM

## 2022-10-12 MED ORDER — METHYLPHENIDATE HCL ER (OSM) 36 MG PO TBCR: 72.0000 mg | EXTENDED_RELEASE_TABLET | Freq: Every day | ORAL | 0 refills | Status: DC

## 2022-10-12 MED ORDER — QUETIAPINE FUMARATE 200 MG PO TABS: ORAL_TABLET | ORAL | 0 refills | Status: DC

## 2022-10-12 NOTE — Progress Notes (Signed)
Robin Welch [id number] 10-05-1965 57 y.o.  Virtual Visit via Video Note  I connected with pt @ on 10/12/22 at 10:00 AM EST by a video enabled telemedicine application and verified that I am speaking with the correct person using two identifiers.   I discussed the limitations of evaluation and management by telemedicine and the availability of in person appointments. The patient expressed understanding and agreed to proceed.  I discussed the assessment and treatment plan with the patient. The patient was provided an opportunity to ask questions and all were answered. The patient agreed with the plan and demonstrated an understanding of the instructions.   The patient was advised to call back or seek an in-person evaluation if the symptoms worsen or if the condition fails to improve as anticipated.  I provided 19 minutes of non-face-to-face time during this encounter.  The patient was located at home.  The provider was located at [place].   Thayer Headings, PMHNP   Subjective:   Patient ID:  Robin Welch is a 57 y.o. (DOB [birth year]) female.  Chief Complaint:  Chief Complaint  Patient presents with   ADHD    HPI [name] presents for follow-up of ADHD, Bipolar Disorder, anxiety, and insomnia.  She reports, "I can tell this is working, where the Adderall was not working at all." She reports that Concerta seems to last about 8-9 hours. She reports that it takes longer for Concerta to become effective. She reports that she feels that Concerta is more consistent throughout the day. She reports that she has been able to complete tasks. She reports that she has to make lists and at times feeling overwhelmed. She reports severe anxiety in response to conflict. She reports that she became angry yesterday in response to confrontation yesterday. Denies depressed mood. Sleeping fine. Sleeping about 8 hours a night. Appetite has been good and reports craving sugar. Denies SI.   She  reports that her energy is slightly low and Concerta is helpful for this.    She has been able to reduce Seroquel to 2 tabs in the evening.   She has been helping take care of grandchild while family moved and also help them move.   Concerta last filled [id number].   Past Psychiatric Medication Trials: Xanax- Misused Klonopin Ativan Seroquel- Helpful for sleep and anxiety. Olanzapine Geodon Abilify Lithium Lamictal Depakote Buspar Hydroxyzine Gabapentin Prozac- Hives, edema Celexa- Possible allergic reaction Paxil Wellbutrin XL Wellbutrin SR Remeron Sonata Ambien Zolpimist Lunesta Dayvigo Belsomra Trazodone Adderall-Not as effective as XR Adderall XR- Well tolerated and effective Vyvanse- Well tolerated Concerta-Effective  Review of Systems:  Review of Systems  HENT:  Positive for congestion.   Cardiovascular:  Negative for palpitations.  Musculoskeletal:  Negative for gait problem.  Neurological:  Negative for tremors.  Psychiatric/Behavioral:         Please refer to HPI    Medications: I have reviewed the patient's current medications.  Current Outpatient Medications  Medication Sig Dispense Refill   b complex vitamins capsule Take 1 capsule by mouth daily.     Cyanocobalamin (VITAMIN B 12 PO) Take by mouth.     [START ON 10/20/2022] methylphenidate (CONCERTA) 36 MG PO CR tablet Take 2 tablets (72 mg total) by mouth daily. 60 tablet 0   albuterol (VENTOLIN HFA) 108 (90 Base) MCG/ACT inhaler Inhale 2 puffs into the lungs every 6 (six) hours as needed for wheezing or shortness of breath. 18 g 3   calcium carbonate (CALCIUM 600) 600 MG  TABS tablet Take 1 tablet (600 mg total) by mouth daily with breakfast. 3 tablet    cariprazine (VRAYLAR) capsule Take 3 mg by mouth daily.     Cholecalciferol (VITAMIN D) 50 MCG (2000 UT) CAPS Take 1 capsule (2,000 Units total) by mouth daily. (Patient taking differently: Take 2,000 Units by mouth daily.)     cholestyramine  light (PREVALITE) 4 g packet Take 0.5 packets (2 g total) by mouth 2 (two) times daily. (Patient not taking: Reported on 03/18/2022) 30 packet 3   esomeprazole (NEXIUM) 40 MG capsule TAKE 1 CAPSULE(40 MG) BY MOUTH DAILY 90 capsule 0   MAGNESIUM PO Take by mouth.     Multiple Vitamin (MULTIVITAMIN WITH MINERALS) TABS tablet Take 1 tablet by mouth daily.     QUEtiapine (SEROQUEL) 200 MG tablet TAKE 2 TO 3 TABLETS AT BEDTIME 270 tablet 0   sucralfate (CARAFATE) 1 g tablet TAKE 1 TABLET(1 GRAM) BY MOUTH FOUR TIMES DAILY WITH MEALS AND AT BEDTIME AS NEEDED FOR UPSET STOMACH OR REFLUX 360 tablet 0   Tiotropium Bromide-Olodaterol (STIOLTO RESPIMAT) 2.5-2.5 MCG/ACT AERS Inhale 2 puffs into the lungs daily. 1 each 3   No current facility-administered medications for this visit.    Medication Side Effects: None  Allergies:  Allergies  Allergen Reactions   Prozac [Fluoxetine Hcl] Swelling and Rash   Doxycycline Nausea And Vomiting   Celexa [Citalopram Hydrobromide] Other (See Comments)    Possible allergic reaction   Chantix [Varenicline Tartrate] Swelling    Mouth swelling   Hydroxyzine Other (See Comments)    Not effective   Klonopin [Clonazepam] Other (See Comments)    Caused instablility. Not able to drive due to over medicated   Lunesta [Eszopiclone] Other (See Comments)    Not effective   Sonata [Zaleplon] Other (See Comments)    Not effective   Trazodone And Nefazodone Other (See Comments)    ineffective   Zolpimist [Zolpidem Tartrate] Other (See Comments)    Not effective   Azithromycin Other (See Comments)    Reaction not recalled by the patient    Past Medical History:  Diagnosis Date   Anemia    Anxiety    Barrett esophagus    Bipolar 1 disorder ([place])    sees Dr. Candis Schatz psych [phone])   Closed fracture distal radius and ulna, left, initial encounter 04/20/2019   S/p ORIF 04/2019 Jeannie Fend)    COPD (chronic obstructive pulmonary disease) ([place]) 03/2013    hyperinflation by CXR   Elevated alkaline phosphatase level    Esophagitis    Hiatal hernia    History of cocaine dependence ([place]) latest 2020   undergoing detox   History of stomach ulcers    Hx: UTI (urinary tract infection)    Migraine    "stopped in ~ 2008" (03/11/2017)   Muscle strain of gluteal region, right, subsequent encounter 10/05/2018   By MRI 2020   Osteoporosis 06/09/2018   DEXA 05/2017 - T score -2.5 L hip, -1.3 spine   Positive PPD 1989   s/p CXR and INH 6 mo   Seizure ([place]) X 1   "from takiing too many headache medicine?" (03/11/2017)   Smoker     Family History  Problem Relation Age of Onset   Diabetes Mother        T2DM   Breast cancer Mother 28   Dementia Mother    Lymphoma Mother    Coronary artery disease Father 77       MI  CAD Father    ADD / ADHD Son    ADD / ADHD Son    ADD / ADHD Child    Anxiety disorder Child    Depression Child    Anxiety disorder Paternal Aunt    Suicidality Paternal Uncle    Depression Paternal Uncle    Anxiety disorder Cousin    Depression Cousin    Stroke Neg Hx    Colon cancer Neg Hx    Cancer - Colon Neg Hx    Esophageal cancer Neg Hx    Stomach cancer Neg Hx    Rectal cancer Neg Hx     Social History   Socioeconomic History   Marital status: Divorced    Spouse name: Not on file   Number of children: 2   Years of education: bachelors   Highest education level: Not on file  Occupational History   Occupation: Product manager: [employer]    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, working on disability  Tobacco Use   Smoking status: Every Day    Packs/day: 1.00    Years: 36.00    Total pack years: 36.00    Types: Cigarettes   Smokeless tobacco: Never   Tobacco comments:    1 pack of cigarettes smoked daily 02/19/22 [facility]   Vaping Use   Vaping Use: Never used  Substance and Sexual Activity   Alcohol use: Not Currently    Alcohol/week: 0.0 standard drinks of alcohol    Comment: 03/11/2017  "stopped 09/18/2015; never an alcoholic"   Drug use: Not Currently    Comment: not currently   Sexual activity: Never    Birth control/protection: Surgical  Other Topics Concern   Not on file  Social History Narrative   Caffeine: 40 oz diet mountain dew/day   Lives with husband and 2 children, no pets      Social Determinants of Health   Financial Resource Strain: Not on file  Food Insecurity: Not on file  Transportation Needs: Not on file  Physical Activity: Not on file  Stress: Not on file  Social Connections: Not on file  Intimate Partner Violence: Not on file    Past Medical History, Surgical history, Social history, and Family history were reviewed and updated as appropriate.   Please see review of systems for further details on the patient's review from today.   Objective:   Physical Exam:  LMP 04/23/2015 (Approximate)   Physical Exam Neurological:     Mental Status: She is alert and oriented to person, place, and time.     Cranial Nerves: No dysarthria.  Psychiatric:        Attention and Perception: Attention and perception normal.        Mood and Affect: Mood normal.        Speech: Speech normal.        Behavior: Behavior is cooperative.        Thought Content: Thought content normal. Thought content is not paranoid or delusional. Thought content does not include homicidal or suicidal ideation. Thought content does not include homicidal or suicidal plan.        Cognition and Memory: Cognition and memory normal.        Judgment: Judgment normal.     Comments: Insight intact     Lab Review:     Component Value Date/Time   NA 121 (LL) 04/03/2020 1006   K 3.6 04/03/2020 1006   CL 84 (L) 04/03/2020 1006  CO2 29 04/03/2020 1006   GLUCOSE 93 04/03/2020 1006   BUN 12 04/03/2020 1006   CREATININE 0.56 04/03/2020 1006   CALCIUM 8.2 (L) 04/03/2020 1006   PROT 6.1 04/03/2020 1006   ALBUMIN 3.1 (L) 04/03/2020 1006   AST 14 04/03/2020 1006   ALT 11 04/03/2020  1006   ALKPHOS 133 (H) 04/03/2020 1006   BILITOT 0.4 04/03/2020 1006   GFRNONAA >60 03/18/2020 0434   GFRAA >60 03/18/2020 0434       Component Value Date/Time   WBC 15.2 (H) 04/03/2020 1006   RBC 3.58 (L) 04/03/2020 1006   HGB 11.2 (L) 04/03/2020 1006   HGB 10.8 (L) 07/11/2018 1050   HCT 33.0 (L) 04/03/2020 1006   PLT 823.0 (H) 04/03/2020 1006   PLT 547 (H) 07/11/2018 1050   MCV 92.2 04/03/2020 1006   MCH 32.3 03/18/2020 0434   MCHC 34.0 04/03/2020 1006   RDW 14.2 04/03/2020 1006   LYMPHSABS 1.0 04/03/2020 1006   MONOABS 0.7 04/03/2020 1006   EOSABS 0.3 04/03/2020 1006   BASOSABS 0.2 (H) 04/03/2020 1006    Lithium Lvl  Date Value Ref Range Status  12/27/2010 1.43 (H) 0.80 - 1.40 mEq/L Final     No results found for: "PHENYTOIN", "PHENOBARB", "VALPROATE", "CBMZ"   .res Assessment: Plan:    Discussed her concerns about possible increase in Concerta since 54 mg dose seems to be only partially effective. Discussed that next dose would be 72 mg (two 36 mg tablets) and therefore her out of pocket cost may be higher if insurance is not covering medication. She reports that she would like to start trial of 72 mg dose and that if it is cost-prohibitive she would prefer to continue with 54 mg tablet daily. Will send one script for 72 mg dose and requested that pt follow-up with office regarding next scripts.  Continue Vraylar 3 mg daily for depression and mood stabilization.  Continue Seroquel 200 mg 2-3 tabs at bedtime for mood stabilization and insomnia.  Pt to follow-up in 3 months or sooner if clinically indicated.  Patient advised to contact office with any questions, adverse effects, or acute worsening in signs and symptoms.   Shamesha was seen today for adhd.  Diagnoses and all orders for this visit:  Attention deficit hyperactivity disorder (ADHD), combined type -     methylphenidate (CONCERTA) 36 MG PO CR tablet; Take 2 tablets (72 mg total) by mouth daily.  Bipolar I  disorder (HCC) -     QUEtiapine (SEROQUEL) 200 MG tablet; TAKE 2 TO 3 TABLETS AT BEDTIME  Insomnia due to mental disorder -     QUEtiapine (SEROQUEL) 200 MG tablet; TAKE 2 TO 3 TABLETS AT BEDTIME     Please see After Visit Summary for patient specific instructions.  No future appointments.  No orders of the defined types were placed in this encounter.     -------------------------------

## 2022-10-22 ENCOUNTER — Telehealth: Payer: Self-pay | Admitting: Psychiatry

## 2022-10-22 NOTE — Telephone Encounter (Signed)
I LM for Allida to complete her application for [id number] for pt. Assistance for [employer].  I told her that we can't keep her in samples that she needs to re-apply for assistance.  If she is not going to apply, maybe she doesn't need the assistance in which case a prescription should be sent to her pharmacy.

## 2022-10-23 ENCOUNTER — Telehealth: Payer: Self-pay | Admitting: Psychiatry

## 2022-10-23 NOTE — Telephone Encounter (Signed)
Noted  

## 2022-10-23 NOTE — Telephone Encounter (Signed)
Robin Welch called this morning at 11:35 to report that she is experiencing an increase in her depression.  She is struggling even with the vraylar.  She is wondering if something can be added to help.  No appt scheduled.  Please call to discuss.  IF a prescription is sent it should go to [facility] on [address].

## 2022-10-23 NOTE — Telephone Encounter (Signed)
Called patient back and she said she had left another message while we were at lunch. She said she started thinking about things and said when she got her first fill on methylphenidate she thought it worked great but wanted to try an increased dose. This fill she got 72 mg and she said she can't tell it is doing anything - possibly a different manufacturer. She wonders if she is depressed or it is possibly the above.

## 2022-10-24 DIAGNOSIS — J208 Acute bronchitis due to other specified organisms: Secondary | ICD-10-CM | POA: Diagnosis not present

## 2022-10-27 NOTE — Telephone Encounter (Signed)
Noted  

## 2022-10-27 NOTE — Telephone Encounter (Signed)
Called patient to F/U on message from last week. She said she had a sinus infection and since starting on an antibiotic for that that the Concerta seems to be working much better.   She had no complaints at this time.

## 2022-11-09 ENCOUNTER — Telehealth: Payer: Self-pay | Admitting: Psychiatry

## 2022-11-09 NOTE — Telephone Encounter (Signed)
Robin Welch has been re-approved for pt assistance for Vraylar until 08/31/23

## 2022-11-09 NOTE — Telephone Encounter (Signed)
Noted  

## 2022-11-11 ENCOUNTER — Telehealth: Payer: Self-pay | Admitting: Psychiatry

## 2022-11-11 DIAGNOSIS — F902 Attention-deficit hyperactivity disorder, combined type: Secondary | ICD-10-CM

## 2022-11-11 MED ORDER — METHYLPHENIDATE HCL ER (OSM) 36 MG PO TBCR: 72.0000 mg | EXTENDED_RELEASE_TABLET | Freq: Every day | ORAL | 0 refills | Status: DC

## 2022-11-11 NOTE — Telephone Encounter (Signed)
Concerta last filled [id number]. Please let her know a script has been sent that she can fill on 11/17/22.

## 2022-11-11 NOTE — Telephone Encounter (Signed)
Pt requesting generic concerta 36 mg 2/d Rx to [facility]. apt due May

## 2022-11-12 NOTE — Telephone Encounter (Signed)
Pt advised.

## 2022-11-16 ENCOUNTER — Telehealth: Payer: Self-pay | Admitting: Psychiatry

## 2022-11-16 DIAGNOSIS — F902 Attention-deficit hyperactivity disorder, combined type: Secondary | ICD-10-CM

## 2022-11-16 MED ORDER — METHYLPHENIDATE HCL ER (OSM) 36 MG PO TBCR: 72.0000 mg | EXTENDED_RELEASE_TABLET | Freq: Every day | ORAL | 0 refills | Status: DC

## 2022-11-16 NOTE — Telephone Encounter (Signed)
Concerta last filled [id number]. Script sent to [facility] as requested by pt with fill date of tomorrow. Please call to cancel script on file at [facility]. [address] with fill date of tomorrow.

## 2022-11-16 NOTE — Telephone Encounter (Signed)
Canceled Rx at [facility] as requested.

## 2022-11-16 NOTE — Telephone Encounter (Signed)
Robin Welch called at 10:05 to request that her [facility] [address]  They are the only pharmacy that has it.  No appt, due in May.

## 2022-11-17 NOTE — Telephone Encounter (Signed)
Patient notified

## 2022-11-17 NOTE — Telephone Encounter (Signed)
Pt called and said that she needs a PA done so that pharmacy will fill it

## 2022-11-17 NOTE — Telephone Encounter (Signed)
Submitted prior authorization for Concerta CR 36 mg tablets 2/day #60 to [facility],

## 2022-11-17 NOTE — Telephone Encounter (Signed)
Prior Approval received effective 08/31/2022-08/31/2023 with [place], PA# [id number]

## 2022-11-18 ENCOUNTER — Telehealth: Payer: Self-pay | Admitting: Psychiatry

## 2022-11-18 NOTE — Telephone Encounter (Signed)
[place] Review approved METHYLPHENIDATE ER 36mg  for 11/17/2022-08/31/2023. [phone]

## 2022-11-20 ENCOUNTER — Other Ambulatory Visit: Payer: Self-pay | Admitting: Family Medicine

## 2022-11-20 DIAGNOSIS — Z1231 Encounter for screening mammogram for malignant neoplasm of breast: Secondary | ICD-10-CM

## 2022-12-10 ENCOUNTER — Encounter: Payer: Self-pay | Admitting: Psychiatry

## 2022-12-10 ENCOUNTER — Telehealth (INDEPENDENT_AMBULATORY_CARE_PROVIDER_SITE_OTHER): Payer: Medicare PPO | Admitting: Psychiatry

## 2022-12-10 DIAGNOSIS — F5105 Insomnia due to other mental disorder: Secondary | ICD-10-CM | POA: Diagnosis not present

## 2022-12-10 DIAGNOSIS — G2401 Drug induced subacute dyskinesia: Secondary | ICD-10-CM

## 2022-12-10 DIAGNOSIS — F319 Bipolar disorder, unspecified: Secondary | ICD-10-CM

## 2022-12-10 DIAGNOSIS — F902 Attention-deficit hyperactivity disorder, combined type: Secondary | ICD-10-CM | POA: Diagnosis not present

## 2022-12-10 MED ORDER — METHYLPHENIDATE HCL ER (OSM) 36 MG PO TBCR: 72.0000 mg | EXTENDED_RELEASE_TABLET | Freq: Every day | ORAL | 0 refills | Status: DC

## 2022-12-10 MED ORDER — AUSTEDO XR PATIENT TITRATION 6 & 12 & 24 MG PO TEPK: EXTENDED_RELEASE_TABLET | ORAL | 0 refills | Status: DC

## 2022-12-10 NOTE — Progress Notes (Signed)
Robin Welch [id number] 03/10/1966 57 y.o.  Virtual Visit via Video Note  I connected with pt @ on 12/10/22 at  8:30 AM EDT by a video enabled telemedicine application and verified that I am speaking with the correct person using two identifiers.   I discussed the limitations of evaluation and management by telemedicine and the availability of in person appointments. The patient expressed understanding and agreed to proceed.  I discussed the assessment and treatment plan with the patient. The patient was provided an opportunity to ask questions and all were answered. The patient agreed with the plan and demonstrated an understanding of the instructions.   The patient was advised to call back or seek an in-person evaluation if the symptoms worsen or if the condition fails to improve as anticipated.  I provided 30 minutes of non-face-to-face time during this encounter.  The patient was located at home.  The provider was located at [facility].   Robin Welch, PMHNP   Subjective:   Patient ID:  Robin Welch is a 57 y.o. (DOB 1966-07-31) female.  Chief Complaint:  Chief Complaint  Patient presents with   Medication Problem    TD   ADHD    HPI Robin Welch presents for follow-up of ADHD, mood disturbance, and anxiety. She reports that she scheduled an earlier apt after her son recently noticed some involuntary movements. She reports that her son noticed that every few breaths she inhales/sniffs deeply through her nose. He also noticed she is tapping her foot. She reports that she read about possible causes and thinks, "it doesn't seem like a tic and seems more like stimming." She reports that she can control these things if she is very focused on not doing it. She reports that these things occur typically without her awareness.  She reports that on Adderall, "I have no cravings for other drugs... but I have to overtake the dosage to get it to work and I have to take the brand  name." She reports, "I feel better on the Adderall than I do on the Concerta." She reports that on Concerta, "I neer feel the need to over take it... it's steady... it's just a normal feeling... I'm not as productive." She reports, "I feel ok, not good." She reports that she reduced Seroquel from 600 mg to 400 mg about 3 weeks ago.   Denies severe anxiety. She reports that she has had some situational anxiety, such as when she was traveling alone. She reports that she is now sleeping 8 hours a night. "I don't feel drugged or hungover in the morning." She reports that her mood has been stable. She reports that her energy and motivation have been "ok... what a normal person feels like" and not excessive. She reports that she is "really tired" after flying to [place] and then going to [place] to observe the eclipse. Denies SI.   Denies ETOH use.   Concerta last filled 11/17/22.   Past Psychiatric Medication Trials: Xanax- Misused Klonopin Ativan Seroquel- Helpful for sleep and anxiety. Olanzapine Geodon- Reports that it was effective and caused involuntary movements Abilify Lithium Lamictal Depakote Buspar Hydroxyzine Gabapentin Prozac- Hives, edema Celexa- Possible allergic reaction Paxil Wellbutrin XL Wellbutrin SR Remeron Sonata Ambien Zolpimist Lunesta Dayvigo Belsomra Trazodone Adderall-Not as effective as XR Adderall XR- Well tolerated and effective Vyvanse- Well tolerated Concerta-Effective    Review of Systems:  Review of Systems  Respiratory:  Negative for shortness of breath.   Cardiovascular:  Negative for palpitations.  Musculoskeletal:  Negative for gait problem.  Neurological:  Negative for tremors.  Psychiatric/Behavioral:         Please refer to HPI    Medications: I have reviewed the patient's current medications.  Current Outpatient Medications  Medication Sig Dispense Refill   Deutetrabenazine (AUSTEDO XR PATIENT TITRATION) 6 & 12 & 24 MG TEPK  Take 12 mg by mouth daily for 7 days, THEN 18 mg daily for 7 days, THEN 24 mg daily for 7 days, THEN 30 mg daily for 7 days. Use as directed. 1 each 0   methylphenidate (CONCERTA) 36 MG PO CR tablet Take 2 tablets (72 mg total) by mouth daily. 60 tablet 0   QUEtiapine (SEROQUEL) 200 MG tablet TAKE 2 TO 3 TABLETS AT BEDTIME (Patient taking differently: 200 mg. TAKE 2 TO 3 TABLETS AT BEDTIME) 270 tablet 0   albuterol (VENTOLIN HFA) 108 (90 Base) MCG/ACT inhaler Inhale 2 puffs into the lungs every 6 (six) hours as needed for wheezing or shortness of breath. 18 g 3   b complex vitamins capsule Take 1 capsule by mouth daily.     calcium carbonate (CALCIUM 600) 600 MG TABS tablet Take 1 tablet (600 mg total) by mouth daily with breakfast. 3 tablet    cariprazine (VRAYLAR) capsule Take 3 mg by mouth daily.     Cholecalciferol (VITAMIN D) 50 MCG (2000 UT) CAPS Take 1 capsule (2,000 Units total) by mouth daily. (Patient taking differently: Take 2,000 Units by mouth daily.)     cholestyramine light (PREVALITE) 4 g packet Take 0.5 packets (2 g total) by mouth 2 (two) times daily. (Patient not taking: Reported on 03/18/2022) 30 packet 3   Cyanocobalamin (VITAMIN B 12 PO) Take by mouth.     esomeprazole (NEXIUM) 40 MG capsule TAKE 1 CAPSULE(40 MG) BY MOUTH DAILY 90 capsule 0   MAGNESIUM PO Take by mouth.     [START ON 12/15/2022] methylphenidate (CONCERTA) 36 MG PO CR tablet Take 2 tablets (72 mg total) by mouth daily. 60 tablet 0   Multiple Vitamin (MULTIVITAMIN WITH MINERALS) TABS tablet Take 1 tablet by mouth daily.     sucralfate (CARAFATE) 1 g tablet TAKE 1 TABLET(1 GRAM) BY MOUTH FOUR TIMES DAILY WITH MEALS AND AT BEDTIME AS NEEDED FOR UPSET STOMACH OR REFLUX 360 tablet 0   Tiotropium Bromide-Olodaterol (STIOLTO RESPIMAT) 2.5-2.5 MCG/ACT AERS Inhale 2 puffs into the lungs daily. 1 each 3   No current facility-administered medications for this visit.    Medication Side Effects: Other: Involuntary  movements  Allergies:  Allergies  Allergen Reactions   Prozac [Fluoxetine Hcl] Swelling and Rash   Doxycycline Nausea And Vomiting   Celexa [Citalopram Hydrobromide] Other (See Comments)    Possible allergic reaction   Chantix [Varenicline Tartrate] Swelling    Mouth swelling   Hydroxyzine Other (See Comments)    Not effective   Klonopin [Clonazepam] Other (See Comments)    Caused instablility. Not able to drive due to over medicated   Lunesta [Eszopiclone] Other (See Comments)    Not effective   Sonata [Zaleplon] Other (See Comments)    Not effective   Trazodone And Nefazodone Other (See Comments)    ineffective   Zolpimist [Zolpidem Tartrate] Other (See Comments)    Not effective   Azithromycin Other (See Comments)    Reaction not recalled by the patient    Past Medical History:  Diagnosis Date   Anemia    Anxiety    Barrett esophagus    Bipolar  1 disorder    sees Dr. Tomasa Rand psych [phone])   Closed fracture distal radius and ulna, left, initial encounter 04/20/2019   S/p ORIF 04/2019 [facility])    COPD (chronic obstructive pulmonary disease) 03/2013   hyperinflation by CXR   Elevated alkaline phosphatase level    Esophagitis    Hiatal hernia    History of cocaine dependence latest 2020   undergoing detox   History of stomach ulcers    Hx: UTI (urinary tract infection)    Migraine    "stopped in ~ 2008" (03/11/2017)   Muscle strain of gluteal region, right, subsequent encounter 10/05/2018   By MRI 2020   Osteoporosis 06/09/2018   DEXA 05/2017 - T score -2.5 L hip, -1.3 spine   Positive PPD 1989   s/p CXR and INH 6 mo   Seizure X 1   "from takiing too many headache medicine?" (03/11/2017)   Smoker     Family History  Problem Relation Age of Onset   Diabetes Mother        T2DM   Breast cancer Mother 26   Dementia Mother    Lymphoma Mother    Coronary artery disease Father 10       MI   CAD Father    ADD / ADHD Son    ADD / ADHD Son    ADD / ADHD  Child    Anxiety disorder Child    Depression Child    Anxiety disorder Paternal Aunt    Suicidality Paternal Uncle    Depression Paternal Uncle    Anxiety disorder Cousin    Depression Cousin    Stroke Neg Hx    Colon cancer Neg Hx    Cancer - Colon Neg Hx    Esophageal cancer Neg Hx    Stomach cancer Neg Hx    Rectal cancer Neg Hx     Social History   Socioeconomic History   Marital status: Divorced    Spouse name: Not on file   Number of children: 2   Years of education: bachelors   Highest education level: Not on file  Occupational History   Occupation: Magazine features editor: [employer]    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, working on disability  Tobacco Use   Smoking status: Every Day    Packs/day: 1.00    Years: 36.00    Additional pack years: 0.00    Total pack years: 36.00    Types: Cigarettes   Smokeless tobacco: Never   Tobacco comments:    1 pack of cigarettes smoked daily 02/19/22 [facility]   Vaping Use   Vaping Use: Never used  Substance and Sexual Activity   Alcohol use: Not Currently    Alcohol/week: 0.0 standard drinks of alcohol    Comment: 03/11/2017 "stopped 09/18/2015; never an alcoholic"   Drug use: Not Currently    Comment: not currently   Sexual activity: Never    Birth control/protection: Surgical  Other Topics Concern   Not on file  Social History Narrative   Caffeine: 40 oz diet mountain dew/day   Lives with husband and 2 children, no pets      Social Determinants of Health   Financial Resource Strain: Not on file  Food Insecurity: Not on file  Transportation Needs: Not on file  Physical Activity: Not on file  Stress: Not on file  Social Connections: Not on file  Intimate Partner Violence: Not on file    Past Medical History,  Surgical history, Social history, and Family history were reviewed and updated as appropriate.   Please see review of systems for further details on the patient's review from today.   Objective:    Physical Exam:  LMP 04/23/2015 (Approximate)   Physical Exam Neurological:     Mental Status: She is alert and oriented to person, place, and time.     Cranial Nerves: No dysarthria.  Psychiatric:        Attention and Perception: Attention and perception normal.        Mood and Affect: Mood normal.        Speech: Speech normal.        Behavior: Behavior is cooperative.        Thought Content: Thought content normal. Thought content is not paranoid or delusional. Thought content does not include homicidal or suicidal ideation. Thought content does not include homicidal or suicidal plan.        Cognition and Memory: Cognition and memory normal.        Judgment: Judgment normal.     Comments: Insight intact     Lab Review:     Component Value Date/Time   NA 121 (LL) 04/03/2020 1006   K 3.6 04/03/2020 1006   CL 84 (L) 04/03/2020 1006   CO2 29 04/03/2020 1006   GLUCOSE 93 04/03/2020 1006   BUN 12 04/03/2020 1006   CREATININE 0.56 04/03/2020 1006   CALCIUM 8.2 (L) 04/03/2020 1006   PROT 6.1 04/03/2020 1006   ALBUMIN 3.1 (L) 04/03/2020 1006   AST 14 04/03/2020 1006   ALT 11 04/03/2020 1006   ALKPHOS 133 (H) 04/03/2020 1006   BILITOT 0.4 04/03/2020 1006   GFRNONAA >60 03/18/2020 0434   GFRAA >60 03/18/2020 0434       Component Value Date/Time   WBC 15.2 (H) 04/03/2020 1006   RBC 3.58 (L) 04/03/2020 1006   HGB 11.2 (L) 04/03/2020 1006   HGB 10.8 (L) 07/11/2018 1050   HCT 33.0 (L) 04/03/2020 1006   PLT 823.0 (H) 04/03/2020 1006   PLT 547 (H) 07/11/2018 1050   MCV 92.2 04/03/2020 1006   MCH 32.3 03/18/2020 0434   MCHC 34.0 04/03/2020 1006   RDW 14.2 04/03/2020 1006   LYMPHSABS 1.0 04/03/2020 1006   MONOABS 0.7 04/03/2020 1006   EOSABS 0.3 04/03/2020 1006   BASOSABS 0.2 (H) 04/03/2020 1006    Lithium Lvl  Date Value Ref Range Status  12/27/2010 1.43 (H) 0.80 - 1.40 mEq/L Final     No results found for: "PHENYTOIN", "PHENOBARB", "VALPROATE", "CBMZ"    .res Assessment: Plan:   Pt seen for 30 minutes and time spent counseling patient about tardive dyskinesia, to include causes and treatment options. Discussed that TD can occasionally temporarily worsen after lowering or discontinuing an antipsychotic, and that she may be having worsening TD since she recently reduced Seroquel to 200 mg po QHS after taking it long-term at higher doses. Discussed option to monitor TD for the next few weeks to determine if it improves after dose reduction or starting treatment. She reports that she would like to start treatment for TD immediately since movements are causing her distress.  Discussed potential benefits,risks, and side effects of Austedo and Ingrezza. Pt agrees to trial of Austedo XR. Will pull Austedo XR titration sample pack for pt to pick up to start treatment with Austedo.  Will continue Concerta 72 mg po qd for ADHD.  Continue Seroquel 200 mg po QHS for mood stabilization, anxiety,  and insomnia.  Pt to follow-up with this provider in 4 weeks or sooner if clinically indicated.  Patient advised to contact office with any questions, adverse effects, or acute worsening in signs and symptoms.   Bonita QuinLinda was seen today for medication problem and adhd.  Diagnoses and all orders for this visit:  Attention deficit hyperactivity disorder (ADHD), combined type -     methylphenidate (CONCERTA) 36 MG PO CR tablet; Take 2 tablets (72 mg total) by mouth daily.  Tardive dyskinesia -     Deutetrabenazine (AUSTEDO XR PATIENT TITRATION) 6 & 12 & 24 MG TEPK; Take 12 mg by mouth daily for 7 days, THEN 18 mg daily for 7 days, THEN 24 mg daily for 7 days, THEN 30 mg daily for 7 days. Use as directed.  Bipolar I disorder  Insomnia due to mental disorder     Please see After Visit Summary for patient specific instructions.  No future appointments.   No orders of the defined types were placed in this encounter.     -------------------------------

## 2022-12-14 ENCOUNTER — Telehealth: Payer: Self-pay | Admitting: Psychiatry

## 2022-12-14 DIAGNOSIS — F902 Attention-deficit hyperactivity disorder, combined type: Secondary | ICD-10-CM

## 2022-12-14 MED ORDER — METHYLPHENIDATE HCL ER (OSM) 36 MG PO TBCR: 72.0000 mg | EXTENDED_RELEASE_TABLET | Freq: Every day | ORAL | 0 refills | Status: DC

## 2022-12-14 NOTE — Telephone Encounter (Signed)
Canceled RX at [facility].

## 2022-12-14 NOTE — Telephone Encounter (Signed)
Script sent. Please call [facility] on [address] to [employer] on file.

## 2022-12-14 NOTE — Telephone Encounter (Signed)
Pt requesting Concerta Rx to [facility] this month.

## 2023-01-08 ENCOUNTER — Telehealth: Payer: Self-pay | Admitting: Psychiatry

## 2023-01-08 DIAGNOSIS — F902 Attention-deficit hyperactivity disorder, combined type: Secondary | ICD-10-CM

## 2023-01-08 MED ORDER — METHYLPHENIDATE HCL ER (OSM) 36 MG PO TBCR: 72.0000 mg | EXTENDED_RELEASE_TABLET | Freq: Every day | ORAL | 0 refills | Status: DC

## 2023-01-08 NOTE — Telephone Encounter (Signed)
Please let her know that script has been sent that can be filled 01/12/23.

## 2023-01-08 NOTE — Telephone Encounter (Signed)
PT called and asked for a refill of her concerta to be sent to [facility]

## 2023-01-11 ENCOUNTER — Ambulatory Visit: Payer: Medicare PPO | Admitting: Student

## 2023-01-11 ENCOUNTER — Encounter: Payer: Self-pay | Admitting: Student

## 2023-01-11 VITALS — BP 126/72 | HR 95 | Ht 62.0 in | Wt 119.6 lb

## 2023-01-11 DIAGNOSIS — J449 Chronic obstructive pulmonary disease, unspecified: Secondary | ICD-10-CM | POA: Diagnosis not present

## 2023-01-11 MED ORDER — STIOLTO RESPIMAT 2.5-2.5 MCG/ACT IN AERS: 2.0000 | INHALATION_SPRAY | Freq: Every day | RESPIRATORY_TRACT | 11 refills | Status: DC

## 2023-01-11 NOTE — Patient Instructions (Addendum)
-   albuterol (ventolin) with goodrx is cheaper ~$22 - can trial breztri 2 puffs twice daily after your PFT - if you prefer it to stiolto let us know, otherwise would resume stiolto 2 puffs daily once your breztri inhaler runs out - see you in a month to repeat PFTs

## 2023-01-11 NOTE — Progress Notes (Signed)
Synopsis: Referred for COPD and DMV breathalyzer evaluation by Robin Boyden, MD  Subjective:   PATIENT ID: Robin Welch GENDER: female DOB: [birth year], MRN: [id number]  Chief Complaint  Patient presents with   Follow-up   [name] with history of COPD seen by Dr. Celine Welch referred for COPD and DMV breathalyzer evaluation  She says that she had a blower on her car for 74 days. Was able to do it successfully up to that point. Went with another company and needs to blow 0.3L/sec for 6 seconds.   She says she has had 2 courses of prednisone this year for AECOPD. She says she is still using stiolto 2 puffs daily. She doesn't have a rescue inhaler because she says it costs $59 copay.   She is a Engineer, site.   Interval HPI  Despite our recommendation for reduced lung capacity interlock device  She has similar trouble with coordinating hum and blow with enough flow and volume to unlock interlock.   She does have a little DOE that's pretty mild. No exacerbations requiring prednisone.   Otherwise pertinent review of systems is negative.    Past Medical History:  Diagnosis Date   Anemia    Anxiety    Barrett esophagus    Bipolar 1 disorder (HCC)    sees Dr. Tomasa Welch psych [phone])   Closed fracture distal radius and ulna, left, initial encounter 04/20/2019   S/p ORIF 04/2019 Robin Welch)    COPD (chronic obstructive pulmonary disease) (HCC) 03/2013   hyperinflation by CXR   Elevated alkaline phosphatase level    Esophagitis    Hiatal hernia    History of cocaine dependence (HCC) latest 2020   undergoing detox   History of stomach ulcers    Hx: UTI (urinary tract infection)    Migraine    "stopped in ~ 2008" (03/11/2017)   Muscle strain of gluteal region, right, subsequent encounter 10/05/2018   By MRI 2020   Osteoporosis 06/09/2018   DEXA 05/2017 - T score -2.5 L hip, -1.3 spine   Positive PPD 1989   s/p CXR and INH 6 mo   Seizure (HCC) X 1   "from takiing too many  headache medicine?" (03/11/2017)   Smoker      Family History  Problem Relation Age of Onset   Diabetes Mother        T2DM   Breast cancer Mother 60   Dementia Mother    Lymphoma Mother    Coronary artery disease Father 65       MI   CAD Father    ADD / ADHD Son    ADD / ADHD Son    ADD / ADHD Child    Anxiety disorder Child    Depression Child    Anxiety disorder Paternal Aunt    Suicidality Paternal Uncle    Depression Paternal Uncle    Anxiety disorder Cousin    Depression Cousin    Stroke Neg Hx    Colon cancer Neg Hx    Cancer - Colon Neg Hx    Esophageal cancer Neg Hx    Stomach cancer Neg Hx    Rectal cancer Neg Hx      Past Surgical History:  Procedure Laterality Date   BIOPSY  07/19/2019   Procedure: BIOPSY;  Surgeon: Robin Boop, MD;  Location: [facility];  Service: Endoscopy;;   COLONOSCOPY  07/2018   poor colon prep - rec repeat (Robin Welch)   DILATION AND CURETTAGE OF UTERUS  X  1   S/P miscarriage   ESOPHAGOGASTRODUODENOSCOPY  03/2017   candidal esophagitis, treated, LA grade D esophagitis, 4cm HH (Robin Welch)   ESOPHAGOGASTRODUODENOSCOPY  07/2018   severe reflux esophagitis, neg candida (Robin Welch)   ESOPHAGOGASTRODUODENOSCOPY  09/29/2019   oozing esophageal ulcers, benign appearing strictures (Robin Welch)   ESOPHAGOGASTRODUODENOSCOPY  05/2020   grade D esophagitis, benign severe stenosis dilated, 7cm HH ([facility] Dr Robin Welch)   ESOPHAGOGASTRODUODENOSCOPY  10/2020   LA Grade D esophagitis, severe stenosis s/p dilation, 6cm HH ([facility] Dr Robin Welch)   ESOPHAGOGASTRODUODENOSCOPY (EGD) WITH PROPOFOL N/A 07/19/2019   Procedure: ESOPHAGOGASTRODUODENOSCOPY (EGD) WITH PROPOFOL;  Surgeon: Robin Boop, MD;  Location: [facility];  Service: Endoscopy;  Laterality: N/A;   ESOPHAGOGASTRODUODENOSCOPY (EGD) WITH PROPOFOL N/A 10/24/2019   Procedure: ESOPHAGOGASTRODUODENOSCOPY (EGD) WITH PROPOFOL;  Surgeon: Robin Form, MD;  Location: [facility];  Service: Endoscopy;   Laterality: N/A;   ESOPHAGOGASTRODUODENOSCOPY (EGD) WITH PROPOFOL N/A 11/21/2019   Procedure: ESOPHAGOGASTRODUODENOSCOPY (EGD) WITH PROPOFOL;  Surgeon: Robin Form, MD;  Location: [facility];  Service: Endoscopy;  Laterality: N/A;   LAPAROSCOPIC CHOLECYSTECTOMY  2006   ORIF WRIST FRACTURE Right 04/08/2019   Procedure: OPEN REDUCTION INTERNAL FIXATION (ORIF) DISTAL RADIUS/ULNA FRACTURE;  Surgeon: Robin Mallick, MD;  Location: [facility];  Service: Orthopedics;  Laterality: Right;   TUBAL LIGATION  2003    Social History   Socioeconomic History   Marital status: Divorced    Spouse name: Not on file   Number of children: 2   Years of education: bachelors   Highest education level: Not on file  Occupational History   Occupation: Magazine features editor: [employer]    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, working on disability  Tobacco Use   Smoking status: Every Day    Packs/day: 1.00    Years: 36.00    Additional pack years: 0.00    Total pack years: 36.00    Types: Cigarettes   Smokeless tobacco: Never   Tobacco comments:    1 pack of cigarettes smoked daily 01/11/2023 br  Vaping Use   Vaping Use: Never used  Substance and Sexual Activity   Alcohol use: Not Currently    Alcohol/week: 0.0 standard drinks of alcohol    Comment: 03/11/2017 "stopped 09/18/2015; never an alcoholic"   Drug use: Not Currently    Comment: not currently   Sexual activity: Never    Birth control/protection: Surgical  Other Topics Concern   Not on file  Social History Narrative   Caffeine: 40 oz diet mountain dew/day   Lives with husband and 2 children, no pets      Social Determinants of Health   Financial Resource Strain: Not on file  Food Insecurity: Not on file  Transportation Needs: Not on file  Physical Activity: Not on file  Stress: Not on file  Social Connections: Not on file  Intimate Partner Violence: Not on file     Allergies  Allergen Reactions   Prozac  [Fluoxetine Hcl] Swelling and Rash   Doxycycline Nausea And Vomiting   Celexa [Citalopram Hydrobromide] Other (See Comments)    Possible allergic reaction   Chantix [Varenicline Tartrate] Swelling    Mouth swelling   Hydroxyzine Other (See Comments)    Not effective   Klonopin [Clonazepam] Other (See Comments)    Caused instablility. Not able to drive due to over medicated   Lunesta [Eszopiclone] Other (See Comments)    Not effective   Sonata [Zaleplon] Other (See Comments)  Not effective   Trazodone And Nefazodone Other (See Comments)    ineffective   Zolpimist [Zolpidem Tartrate] Other (See Comments)    Not effective   Azithromycin Other (See Comments)    Reaction not recalled by the patient     Outpatient Medications Prior to Visit  Medication Sig Dispense Refill   albuterol (VENTOLIN HFA) 108 (90 Base) MCG/ACT inhaler Inhale 2 puffs into the lungs every 6 (six) hours as needed for wheezing or shortness of breath. 18 g 3   b complex vitamins capsule Take 1 capsule by mouth daily.     calcium carbonate (CALCIUM 600) 600 MG TABS tablet Take 1 tablet (600 mg total) by mouth daily with breakfast. 3 tablet    cariprazine (VRAYLAR) capsule Take 3 mg by mouth daily.     Cholecalciferol (VITAMIN D) 50 MCG (2000 UT) CAPS Take 1 capsule (2,000 Units total) by mouth daily. (Patient taking differently: Take 2,000 Units by mouth daily.)     cholestyramine light (PREVALITE) 4 g packet Take 0.5 packets (2 g total) by mouth 2 (two) times daily. 30 packet 3   Cyanocobalamin (VITAMIN B 12 PO) Take by mouth.     esomeprazole (NEXIUM) 40 MG capsule TAKE 1 CAPSULE(40 MG) BY MOUTH DAILY 90 capsule 0   MAGNESIUM PO Take by mouth.     methylphenidate (CONCERTA) 36 MG PO CR tablet Take 2 tablets (72 mg total) by mouth daily. 60 tablet 0   [START ON 01/12/2023] methylphenidate (CONCERTA) 36 MG PO CR tablet Take 2 tablets (72 mg total) by mouth daily. 60 tablet 0   Multiple Vitamin (MULTIVITAMIN WITH  MINERALS) TABS tablet Take 1 tablet by mouth daily.     QUEtiapine (SEROQUEL) 200 MG tablet TAKE 2 TO 3 TABLETS AT BEDTIME (Patient taking differently: 200 mg. TAKE 2 TO 3 TABLETS AT BEDTIME) 270 tablet 0   sucralfate (CARAFATE) 1 g tablet TAKE 1 TABLET(1 GRAM) BY MOUTH FOUR TIMES DAILY WITH MEALS AND AT BEDTIME AS NEEDED FOR UPSET STOMACH OR REFLUX 360 tablet 0   Tiotropium Bromide-Olodaterol (STIOLTO RESPIMAT) 2.5-2.5 MCG/ACT AERS Inhale 2 puffs into the lungs daily. 1 each 3   Deutetrabenazine (AUSTEDO XR PATIENT TITRATION) 6 & 12 & 24 MG TEPK Take 12 mg by mouth daily for 7 days, THEN 18 mg daily for 7 days, THEN 24 mg daily for 7 days, THEN 30 mg daily for 7 days. Use as directed. 1 each 0   No facility-administered medications prior to visit.       Objective:   Physical Exam:  General appearance: 57 y.o., female, NAD, conversant  Eyes: anicteric sclerae; PERRL, tracking appropriately HENT: NCAT; MMM Neck: Trachea midline; no lymphadenopathy, no JVD Lungs: Rhonchi bl, with normal respiratory effort CV: RRR, no murmur  Abdomen: Soft, non-tender; non-distended, BS present  Extremities: No peripheral edema, warm Skin: Normal turgor and texture; no rash Psych: Appropriate affect Neuro: Alert and oriented to person and place, no focal deficit     Vitals:   01/11/23 1500  BP: 126/72  Pulse: 95  SpO2: 98%  Weight: 119 lb 9.6 oz (54.3 kg)  Height: 5\' 2"  (1.575 m)   98% on RA BMI Readings from Last 3 Encounters:  01/11/23 21.88 kg/m  02/19/22 25.42 kg/m  02/18/22 25.42 kg/m   Wt Readings from Last 3 Encounters:  01/11/23 119 lb 9.6 oz (54.3 kg)  02/19/22 139 lb (63 kg)  02/18/22 139 lb (63 kg)     CBC  Component Value Date/Time   WBC 15.2 (H) 04/03/2020 1006   RBC 3.58 (L) 04/03/2020 1006   HGB 11.2 (L) 04/03/2020 1006   HGB 10.8 (L) 07/11/2018 1050   HCT 33.0 (L) 04/03/2020 1006   PLT 823.0 (H) 04/03/2020 1006   PLT 547 (H) 07/11/2018 1050   MCV 92.2  04/03/2020 1006   MCH 32.3 03/18/2020 0434   MCHC 34.0 04/03/2020 1006   RDW 14.2 04/03/2020 1006   LYMPHSABS 1.0 04/03/2020 1006   MONOABS 0.7 04/03/2020 1006   EOSABS 0.3 04/03/2020 1006   BASOSABS 0.2 (H) 04/03/2020 1006     Chest Imaging: CTA Chest 03/17/20 reviewed by me with multifocal pneumonia, emphysema and bronchial wall thickening   Pulmonary Functions Testing Results:    Latest Ref Rng & Units 03/31/2022    2:49 PM 02/11/2022    2:31 PM  PFT Results  FVC-Pre L 2.42  2.58   FVC-Predicted Pre % 77  82   FVC-Post L 2.39  2.50   FVC-Predicted Post % 76  80   Pre FEV1/FVC % % 74  73   Post FEV1/FCV % % 75  75   FEV1-Pre L 1.79  1.87   FEV1-Predicted Pre % 73  77   FEV1-Post L 1.79  1.88   DLCO uncorrected ml/min/mmHg 13.46  12.58   DLCO UNC% % 70  66   DLCO corrected ml/min/mmHg 13.46  12.58   DLCO COR %Predicted % 70  66   DLVA Predicted % 75  72   TLC L 4.39  4.30   TLC % Predicted % 92  90   RV % Predicted % 92  79    Reviewed by me remarkable for mild obstruction, FEV1 1.88L, 77% pred, mildly reduced diffusing capacity       Assessment & Plan:   # COPD Gold Group B # Gold Grade I ventilatory defect, mild obstruction with mildly reduced diffusing capacity # Emphysema Less exacerbations with stiolto though she doesn't notice a ton of day to day difference wrt her (relatively mild) DOE.  # Smoking  Plan: - spirometry for reduced lung capacity interlock device ordered - albuterol (ventolin) with goodrx is cheaper ~$22 - can trial breztri 2 puffs twice daily after your PFT to see if greater symptomatic response with that than stiolto - if you prefer it to stiolto let us know, otherwise would resume stiolto 2 puffs daily once your breztri inhaler runs out - encourage smoking cessation - discuss lung cancer screening at later visit - appt with Dr. Judeth Horn in a month for second provider to sign her DMV paperwork if she wishes   Omar Person,  MD [facility] 01/11/2023 3:39 PM

## 2023-01-12 ENCOUNTER — Telehealth: Payer: Medicare PPO | Admitting: Psychiatry

## 2023-02-10 ENCOUNTER — Telehealth: Payer: Self-pay | Admitting: Psychiatry

## 2023-02-10 DIAGNOSIS — F902 Attention-deficit hyperactivity disorder, combined type: Secondary | ICD-10-CM

## 2023-02-10 MED ORDER — METHYLPHENIDATE HCL ER (OSM) 36 MG PO TBCR
72.0000 mg | EXTENDED_RELEASE_TABLET | Freq: Every day | ORAL | 0 refills | Status: DC
Start: 2023-02-10 — End: 2023-08-31

## 2023-02-10 NOTE — Telephone Encounter (Signed)
Script sent  

## 2023-02-10 NOTE — Telephone Encounter (Signed)
Pt called for refill of Concerta to   [facility] - [place], [place] - [address], [place] [address] Phone: [phone]  Fax: [phone]   No upcoming appt scheduled.

## 2023-02-11 ENCOUNTER — Ambulatory Visit: Payer: Medicare PPO | Admitting: Pulmonary Disease

## 2023-03-09 ENCOUNTER — Telehealth: Payer: Self-pay | Admitting: Psychiatry

## 2023-03-09 NOTE — Telephone Encounter (Signed)
Robin Welch called at 1?40 to request refill of her Concerta 36mg  2/day.  Appt 7/26.  Send to new pharmacy [facility] at [address]. [place]

## 2023-03-09 NOTE — Telephone Encounter (Signed)
Due 7/10

## 2023-03-10 ENCOUNTER — Other Ambulatory Visit: Payer: Self-pay

## 2023-03-10 DIAGNOSIS — F902 Attention-deficit hyperactivity disorder, combined type: Secondary | ICD-10-CM

## 2023-03-10 MED ORDER — METHYLPHENIDATE HCL ER (OSM) 36 MG PO TBCR: 72.0000 mg | EXTENDED_RELEASE_TABLET | Freq: Every day | ORAL | 0 refills | Status: DC

## 2023-03-10 NOTE — Telephone Encounter (Signed)
Pended.

## 2023-03-12 ENCOUNTER — Encounter (HOSPITAL_BASED_OUTPATIENT_CLINIC_OR_DEPARTMENT_OTHER): Payer: Medicare PPO

## 2023-03-16 ENCOUNTER — Ambulatory Visit: Payer: Medicare PPO | Admitting: Pulmonary Disease

## 2023-03-26 ENCOUNTER — Telehealth: Payer: Medicare PPO | Admitting: Psychiatry

## 2023-03-26 NOTE — Progress Notes (Unsigned)
Robin Welch [id number] 1966-07-01 57 y.o.  Virtual Visit via Video Note  I connected with pt @ on 03/26/23 at 11:00 AM EDT by a video enabled telemedicine application and verified that I am speaking with the correct person using two identifiers.   I discussed the limitations of evaluation and management by telemedicine and the availability of in person appointments. The patient expressed understanding and agreed to proceed.  I discussed the assessment and treatment plan with the patient. The patient was provided an opportunity to ask questions and all were answered. The patient agreed with the plan and demonstrated an understanding of the instructions.   The patient was advised to call back or seek an in-person evaluation if the symptoms worsen or if the condition fails to improve as anticipated.  I provided *** minutes of non-face-to-face time during this encounter.  The patient was located at home.  The provider was located at [facility].   Robin Welch, PMHNP   Subjective:   Patient ID:  Robin Welch is a 57 y.o. (DOB 06-Jun-1966) female.  Chief Complaint: No chief complaint on file.   HPI Robin Welch presents for follow-up of ***   Past Psychiatric Medication Trials: Xanax- Misused Klonopin Ativan Seroquel- Helpful for sleep and anxiety. Olanzapine Geodon- Reports that it was effective and caused involuntary movements Abilify Lithium Lamictal Depakote Buspar Hydroxyzine Gabapentin Prozac- Hives, edema Celexa- Possible allergic reaction Paxil Wellbutrin XL Wellbutrin SR Remeron Sonata Ambien Zolpimist Lunesta Dayvigo Belsomra Trazodone Adderall-Not as effective as XR Adderall XR- Well tolerated and effective Vyvanse- Well tolerated Concerta-Effective   Review of Systems:  Review of Systems  Medications: {medication reviewed/display:3041432}  Current Outpatient Medications  Medication Sig Dispense Refill   albuterol (VENTOLIN HFA) 108  (90 Base) MCG/ACT inhaler Inhale 2 puffs into the lungs every 6 (six) hours as needed for wheezing or shortness of breath. 18 g 3   b complex vitamins capsule Take 1 capsule by mouth daily.     calcium carbonate (CALCIUM 600) 600 MG TABS tablet Take 1 tablet (600 mg total) by mouth daily with breakfast. 3 tablet    cariprazine (VRAYLAR) capsule Take 3 mg by mouth daily.     Cholecalciferol (VITAMIN D) 50 MCG (2000 UT) CAPS Take 1 capsule (2,000 Units total) by mouth daily. (Patient taking differently: Take 2,000 Units by mouth daily.)     cholestyramine light (PREVALITE) 4 g packet Take 0.5 packets (2 g total) by mouth 2 (two) times daily. 30 packet 3   Cyanocobalamin (VITAMIN B 12 PO) Take by mouth.     Deutetrabenazine (AUSTEDO XR PATIENT TITRATION) 6 & 12 & 24 MG TEPK Take 12 mg by mouth daily for 7 days, THEN 18 mg daily for 7 days, THEN 24 mg daily for 7 days, THEN 30 mg daily for 7 days. Use as directed. 1 each 0   esomeprazole (NEXIUM) 40 MG capsule TAKE 1 CAPSULE(40 MG) BY MOUTH DAILY 90 capsule 0   MAGNESIUM PO Take by mouth.     methylphenidate (CONCERTA) 36 MG PO CR tablet Take 2 tablets (72 mg total) by mouth daily. 60 tablet 0   methylphenidate (CONCERTA) 36 MG PO CR tablet Take 2 tablets (72 mg total) by mouth daily. 60 tablet 0   Multiple Vitamin (MULTIVITAMIN WITH MINERALS) TABS tablet Take 1 tablet by mouth daily.     QUEtiapine (SEROQUEL) 200 MG tablet TAKE 2 TO 3 TABLETS AT BEDTIME (Patient taking differently: 200 mg. TAKE 2 TO 3 TABLETS AT BEDTIME)  270 tablet 0   sucralfate (CARAFATE) 1 g tablet TAKE 1 TABLET(1 GRAM) BY MOUTH FOUR TIMES DAILY WITH MEALS AND AT BEDTIME AS NEEDED FOR UPSET STOMACH OR REFLUX 360 tablet 0   Tiotropium Bromide-Olodaterol (STIOLTO RESPIMAT) 2.5-2.5 MCG/ACT AERS Inhale 2 puffs into the lungs daily. 1 each 11   No current facility-administered medications for this visit.    Medication Side Effects: {Medication Side Effects  (Optional):21014029}  Allergies:  Allergies  Allergen Reactions   Prozac [Fluoxetine Hcl] Swelling and Rash   Doxycycline Nausea And Vomiting   Celexa [Citalopram Hydrobromide] Other (See Comments)    Possible allergic reaction   Chantix [Varenicline Tartrate] Swelling    Mouth swelling   Hydroxyzine Other (See Comments)    Not effective   Klonopin [Clonazepam] Other (See Comments)    Caused instablility. Not able to drive due to over medicated   Lunesta [Eszopiclone] Other (See Comments)    Not effective   Sonata [Zaleplon] Other (See Comments)    Not effective   Trazodone And Nefazodone Other (See Comments)    ineffective   Zolpimist [Zolpidem Tartrate] Other (See Comments)    Not effective   Azithromycin Other (See Comments)    Reaction not recalled by the patient    Past Medical History:  Diagnosis Date   Anemia    Anxiety    Barrett esophagus    Bipolar 1 disorder (HCC)    sees Dr. Tomasa Rand psych [phone])   Closed fracture distal radius and ulna, left, initial encounter 04/20/2019   S/p ORIF 04/2019 Roney Mans)    COPD (chronic obstructive pulmonary disease) (HCC) 03/2013   hyperinflation by CXR   Elevated alkaline phosphatase level    Esophagitis    Hiatal hernia    History of cocaine dependence (HCC) latest 2020   undergoing detox   History of stomach ulcers    Hx: UTI (urinary tract infection)    Migraine    "stopped in ~ 2008" (03/11/2017)   Muscle strain of gluteal region, right, subsequent encounter 10/05/2018   By MRI 2020   Osteoporosis 06/09/2018   DEXA 05/2017 - T score -2.5 L hip, -1.3 spine   Positive PPD 1989   s/p CXR and INH 6 mo   Seizure (HCC) X 1   "from takiing too many headache medicine?" (03/11/2017)   Smoker     Family History  Problem Relation Age of Onset   Diabetes Mother        T2DM   Breast cancer Mother 25   Dementia Mother    Lymphoma Mother    Coronary artery disease Father 59       MI   CAD Father    ADD / ADHD Son     ADD / ADHD Son    ADD / ADHD Child    Anxiety disorder Child    Depression Child    Anxiety disorder Paternal Aunt    Suicidality Paternal Uncle    Depression Paternal Uncle    Anxiety disorder Cousin    Depression Cousin    Stroke Neg Hx    Colon cancer Neg Hx    Cancer - Colon Neg Hx    Esophageal cancer Neg Hx    Stomach cancer Neg Hx    Rectal cancer Neg Hx     Social History   Socioeconomic History   Marital status: Divorced    Spouse name: Not on file   Number of children: 2   Years of education: bachelors  Highest education level: Not on file  Occupational History   Occupation: Magazine features editor: [employer]    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, working on disability  Tobacco Use   Smoking status: Every Day    Current packs/day: 1.00    Average packs/day: 1 pack/day for 36.0 years (36.0 ttl pk-yrs)    Types: Cigarettes   Smokeless tobacco: Never   Tobacco comments:    1 pack of cigarettes smoked daily 01/11/2023 br  Vaping Use   Vaping status: Never Used  Substance and Sexual Activity   Alcohol use: Not Currently    Alcohol/week: 0.0 standard drinks of alcohol    Comment: 03/11/2017 "stopped 09/18/2015; never an alcoholic"   Drug use: Not Currently    Comment: not currently   Sexual activity: Never    Birth control/protection: Surgical  Other Topics Concern   Not on file  Social History Narrative   Caffeine: 40 oz diet mountain dew/day   Lives with husband and 2 children, no pets      Social Determinants of Health   Financial Resource Strain: Medium Risk (05/05/2019)   Received from [facility] [place]   Overall Financial Resource Strain (CARDIA)    Difficulty of Paying Living Expenses: Somewhat hard  Food Insecurity: No Food Insecurity (05/05/2019)   Received from [facility] [place]   Hunger Vital Sign    Worried About Running Out of Food in the Last Year: Never true    Ran Out of Food in the Last Year: Never true   Transportation Needs: No Transportation Needs (05/05/2019)   Received from [facility] [place]   PRAPARE - Transportation    Lack of Transportation (Medical): No    Lack of Transportation (Non-Medical): No  Physical Activity: Not on file  Stress: Not on file  Social Connections: Not on file  Intimate Partner Violence: Not on file    Past Medical History, Surgical history, Social history, and Family history were reviewed and updated as appropriate.   Please see review of systems for further details on the patient's review from today.   Objective:   Physical Exam:  LMP 04/23/2015 (Approximate)   Physical Exam  Lab Review:     Component Value Date/Time   NA 121 (LL) 04/03/2020 1006   K 3.6 04/03/2020 1006   CL 84 (L) 04/03/2020 1006   CO2 29 04/03/2020 1006   GLUCOSE 93 04/03/2020 1006   BUN 12 04/03/2020 1006   CREATININE 0.56 04/03/2020 1006   CALCIUM 8.2 (L) 04/03/2020 1006   PROT 6.1 04/03/2020 1006   ALBUMIN 3.1 (L) 04/03/2020 1006   AST 14 04/03/2020 1006   ALT 11 04/03/2020 1006   ALKPHOS 133 (H) 04/03/2020 1006   BILITOT 0.4 04/03/2020 1006   GFRNONAA >60 03/18/2020 0434   GFRAA >60 03/18/2020 0434       Component Value Date/Time   WBC 15.2 (H) 04/03/2020 1006   RBC 3.58 (L) 04/03/2020 1006   HGB 11.2 (L) 04/03/2020 1006   HGB 10.8 (L) 07/11/2018 1050   HCT 33.0 (L) 04/03/2020 1006   PLT 823.0 (H) 04/03/2020 1006   PLT 547 (H) 07/11/2018 1050   MCV 92.2 04/03/2020 1006   MCH 32.3 03/18/2020 0434   MCHC 34.0 04/03/2020 1006   RDW 14.2 04/03/2020 1006   LYMPHSABS 1.0 04/03/2020 1006   MONOABS 0.7 04/03/2020 1006   EOSABS 0.3 04/03/2020 1006   BASOSABS 0.2 (H) 04/03/2020 1006  Lithium Lvl  Date Value Ref Range Status  12/27/2010 1.43 (H) 0.80 - 1.40 mEq/L Final     No results found for: "PHENYTOIN", "PHENOBARB", "VALPROATE", "CBMZ"   .res Assessment: Plan:    There are no diagnoses linked to this encounter.   Please see After Visit  Summary for patient specific instructions.  Future Appointments  Date Time Provider Department Center  03/26/2023 11:00 AM Robin Welch, PMHNP [facility] None    No orders of the defined types were placed in this encounter.     -------------------------------

## 2023-03-29 ENCOUNTER — Telehealth: Payer: Self-pay | Admitting: Psychiatry

## 2023-03-29 NOTE — Telephone Encounter (Addendum)
Pt called requesting Vraylar samples. Tried to reorder on phone with [employer]. On hold for an hour. Because address changed and has to talk to person. Asking if we can order RF and give new address? Told her I'd send info to provider. Epic undated address. Need samples going on vacation tomorrow and will be back Sunday [place] apt from 7/26 to 8/12

## 2023-03-29 NOTE — Telephone Encounter (Signed)
Please let her know samples have been pulled and are ready for p/u.

## 2023-03-29 NOTE — Telephone Encounter (Signed)
Patient gets Wellsite geologist thru patient assistance thru [employer].

## 2023-03-29 NOTE — Telephone Encounter (Signed)
Patient notified of samples. Patient gets Wellsite geologist thru patient assistance, [facility] is not the pharmacy used for this.

## 2023-03-30 ENCOUNTER — Encounter (HOSPITAL_BASED_OUTPATIENT_CLINIC_OR_DEPARTMENT_OTHER): Payer: Medicare PPO

## 2023-04-06 ENCOUNTER — Other Ambulatory Visit: Payer: Self-pay

## 2023-04-06 DIAGNOSIS — F902 Attention-deficit hyperactivity disorder, combined type: Secondary | ICD-10-CM

## 2023-04-06 MED ORDER — METHYLPHENIDATE HCL ER (OSM) 36 MG PO TBCR: 72.0000 mg | EXTENDED_RELEASE_TABLET | Freq: Every day | ORAL | 0 refills | Status: DC

## 2023-04-12 ENCOUNTER — Telehealth: Payer: Medicare PPO | Admitting: Psychiatry

## 2023-04-15 ENCOUNTER — Encounter: Payer: Self-pay | Admitting: Psychiatry

## 2023-04-15 ENCOUNTER — Telehealth: Payer: Self-pay | Admitting: Psychiatry

## 2023-04-15 ENCOUNTER — Telehealth (INDEPENDENT_AMBULATORY_CARE_PROVIDER_SITE_OTHER): Payer: Medicare PPO | Admitting: Psychiatry

## 2023-04-15 DIAGNOSIS — F319 Bipolar disorder, unspecified: Secondary | ICD-10-CM

## 2023-04-15 DIAGNOSIS — F902 Attention-deficit hyperactivity disorder, combined type: Secondary | ICD-10-CM | POA: Diagnosis not present

## 2023-04-15 DIAGNOSIS — F5105 Insomnia due to other mental disorder: Secondary | ICD-10-CM

## 2023-04-15 MED ORDER — METHYLPHENIDATE HCL ER (OSM) 36 MG PO TBCR
72.0000 mg | EXTENDED_RELEASE_TABLET | Freq: Every day | ORAL | 0 refills | Status: DC
Start: 2023-05-08 — End: 2023-06-07

## 2023-04-15 MED ORDER — QUETIAPINE FUMARATE 200 MG PO TABS
ORAL_TABLET | ORAL | 0 refills | Status: DC
Start: 2023-04-15 — End: 2023-10-29

## 2023-04-15 NOTE — Telephone Encounter (Signed)
Noted, thank you

## 2023-04-15 NOTE — Telephone Encounter (Signed)
Refill for Vraylar 3mg  has been ordered from Pt Assistance.  It will be received in 7-10 business days.  Her new address has been given and confirmed to [employer].
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
## 2023-04-21 DIAGNOSIS — J441 Chronic obstructive pulmonary disease with (acute) exacerbation: Secondary | ICD-10-CM | POA: Diagnosis not present

## 2023-05-02 DIAGNOSIS — L309 Dermatitis, unspecified: Secondary | ICD-10-CM | POA: Diagnosis not present

## 2023-05-02 DIAGNOSIS — J40 Bronchitis, not specified as acute or chronic: Secondary | ICD-10-CM | POA: Diagnosis not present

## 2023-05-02 DIAGNOSIS — J441 Chronic obstructive pulmonary disease with (acute) exacerbation: Secondary | ICD-10-CM | POA: Diagnosis not present

## 2023-05-07 ENCOUNTER — Telehealth: Payer: Self-pay | Admitting: Psychiatry

## 2023-05-07 NOTE — Telephone Encounter (Signed)
Patient has RF at pharmacy to be filled tomorrow.  Patient notified.

## 2023-05-07 NOTE — Telephone Encounter (Signed)
Patient lvm requesting a refill on the Concerta. [facility] at [id number] [place], [place] - [address] AT [facility] [address], [place] [address] Phone: [phone]  Fax: [phone]

## 2023-06-07 ENCOUNTER — Telehealth: Payer: Self-pay | Admitting: Psychiatry

## 2023-06-07 ENCOUNTER — Other Ambulatory Visit: Payer: Self-pay

## 2023-06-07 DIAGNOSIS — F902 Attention-deficit hyperactivity disorder, combined type: Secondary | ICD-10-CM

## 2023-06-07 MED ORDER — METHYLPHENIDATE HCL ER (OSM) 36 MG PO TBCR
72.0000 mg | EXTENDED_RELEASE_TABLET | Freq: Every day | ORAL | 0 refills | Status: DC
Start: 2023-06-07 — End: 2023-07-05

## 2023-06-07 NOTE — Telephone Encounter (Signed)
Pended.

## 2023-06-07 NOTE — Telephone Encounter (Signed)
Pt called asking for a refill  on her concerta 36 mg.[facility] is [facility]

## 2023-06-28 DIAGNOSIS — L738 Other specified follicular disorders: Secondary | ICD-10-CM | POA: Diagnosis not present

## 2023-06-28 DIAGNOSIS — L2989 Other pruritus: Secondary | ICD-10-CM | POA: Diagnosis not present

## 2023-06-28 DIAGNOSIS — L308 Other specified dermatitis: Secondary | ICD-10-CM | POA: Diagnosis not present

## 2023-07-05 ENCOUNTER — Telehealth: Payer: Self-pay | Admitting: Psychiatry

## 2023-07-05 ENCOUNTER — Other Ambulatory Visit: Payer: Self-pay

## 2023-07-05 DIAGNOSIS — F902 Attention-deficit hyperactivity disorder, combined type: Secondary | ICD-10-CM

## 2023-07-05 MED ORDER — METHYLPHENIDATE HCL ER (OSM) 36 MG PO TBCR
72.0000 mg | EXTENDED_RELEASE_TABLET | Freq: Every day | ORAL | 0 refills | Status: DC
Start: 2023-07-05 — End: 2023-08-03

## 2023-07-05 NOTE — Telephone Encounter (Signed)
Pt lvm that she needs a refill on her concerta 36 mg.Pharmacy is [facility] on [address]

## 2023-07-05 NOTE — Telephone Encounter (Signed)
Pended.

## 2023-07-14 ENCOUNTER — Telehealth: Payer: Medicare PPO | Admitting: Psychiatry

## 2023-07-14 ENCOUNTER — Encounter: Payer: Self-pay | Admitting: Psychiatry

## 2023-07-20 DIAGNOSIS — J441 Chronic obstructive pulmonary disease with (acute) exacerbation: Secondary | ICD-10-CM | POA: Diagnosis not present

## 2023-08-02 ENCOUNTER — Telehealth: Payer: Self-pay | Admitting: Psychiatry

## 2023-08-02 NOTE — Telephone Encounter (Signed)
Guiliana called at 10:45 ;to request refill of her concerta.  No follow up scheduled.  Will need to schedule with new provider or she may follow Shanda Bumps.  Send prescription to [facility] in [place] where it was sent last time.

## 2023-08-02 NOTE — Telephone Encounter (Signed)
Robin Welch called to request RF on Concerta RX. She will need to switch providers, so office staff can call her to RS with another provider. Please send the Concerta in to [name], [name]

## 2023-08-02 NOTE — Telephone Encounter (Signed)
LF 11/6, due 12/3

## 2023-08-02 NOTE — Telephone Encounter (Signed)
Addressed from another message. RF due 12/3.

## 2023-08-03 ENCOUNTER — Other Ambulatory Visit: Payer: Self-pay

## 2023-08-03 DIAGNOSIS — F902 Attention-deficit hyperactivity disorder, combined type: Secondary | ICD-10-CM

## 2023-08-03 MED ORDER — METHYLPHENIDATE HCL ER (OSM) 36 MG PO TBCR
72.0000 mg | EXTENDED_RELEASE_TABLET | Freq: Every day | ORAL | 0 refills | Status: DC
Start: 2023-08-03 — End: 2023-08-31

## 2023-08-03 NOTE — Telephone Encounter (Signed)
Pended Concerta to [facility]

## 2023-08-08 DIAGNOSIS — N390 Urinary tract infection, site not specified: Secondary | ICD-10-CM | POA: Diagnosis not present

## 2023-08-20 ENCOUNTER — Telehealth: Payer: Self-pay | Admitting: Psychiatry

## 2023-08-20 NOTE — Telephone Encounter (Signed)
Patient called in stating that she is taking Concerta and that its doesn't seem to be working. She would like to switch back to Adderall as that seems to be working well for her. PH: [phone] [facility] [address] [place]

## 2023-08-20 NOTE — Telephone Encounter (Signed)
Pt last filled Concerta 12/3. She feels like it worked initially, but doesn't feel it is working as well now. She said she had some Adderall left and took it and it worked better, that maybe she just needed a break from it. She didn't specify dose she was requesting, but when I called her she said whatever she was on last.  Per PMPD she was last on Adderall XR20 BID. She isn't due yet, but if appropriate I will pend when due.

## 2023-08-31 ENCOUNTER — Telehealth: Payer: Self-pay

## 2023-08-31 DIAGNOSIS — F902 Attention-deficit hyperactivity disorder, combined type: Secondary | ICD-10-CM

## 2023-08-31 MED ORDER — AMPHETAMINE-DEXTROAMPHET ER 20 MG PO CP24
40.0000 mg | ORAL_CAPSULE | Freq: Every day | ORAL | 0 refills | Status: DC
Start: 2023-08-31 — End: 2023-12-13

## 2023-08-31 NOTE — Telephone Encounter (Signed)
[facility]

## 2023-09-02 NOTE — Telephone Encounter (Signed)
 Pt called at 11:01 and said she [facility] [address] and they have Adderall XR 30mg .  She is supposed to have it 2 times a day.  Pls send correct script there.  She has confirmed they have it in stock.    No up coming appt scheduled.

## 2023-09-02 NOTE — Telephone Encounter (Signed)
 Pt said she was on 30XR 2 a day previously. She was on 30XR but not 2 a day. She said to leave at 20 XR BID and she has a script at the pharmacy for that.

## 2023-09-28 ENCOUNTER — Telehealth: Payer: Self-pay | Admitting: Physician Assistant

## 2023-09-28 NOTE — Telephone Encounter (Signed)
Next visit is 11/01/23. Requesting refill on her Concerta 36 mg called to:   [id number] [place], [place] - [address] AT [facility] [address] [facility] [address]   Phone: [phone]  Fax: [phone]

## 2023-09-28 NOTE — Telephone Encounter (Signed)
LF 1/2 for Adderall. She was on Concerta 36 mg x2 and requested to go back to Adderall, but now is requesting Concerta again.

## 2023-09-30 ENCOUNTER — Telehealth: Payer: Self-pay

## 2023-09-30 MED ORDER — METHYLPHENIDATE HCL ER (OSM) 36 MG PO TBCR: 72.0000 mg | EXTENDED_RELEASE_TABLET | Freq: Every day | ORAL | 0 refills | Status: DC

## 2023-09-30 NOTE — Telephone Encounter (Signed)
Pended Concerta 36 mg, #[facility] [address]  Due 1/31.

## 2023-09-30 NOTE — Telephone Encounter (Signed)
Pt called at 11:00a asking for the Concerta refill to be sent today so they can order it and have it in stock for tomorrow.  [facility].

## 2023-09-30 NOTE — Telephone Encounter (Signed)
Patient asked that Concerta RF be sent today, even though not due until tomorrow, so that pharmacy can order medication.

## 2023-09-30 NOTE — Telephone Encounter (Signed)
sent

## 2023-10-12 ENCOUNTER — Telehealth: Payer: Self-pay | Admitting: Physician Assistant

## 2023-10-12 NOTE — Telephone Encounter (Signed)
[name] has been re-approved for Pt. Assistance for [employer].  They will contact her to arrange delivery of medication.  Robin Welch is aware.   She is approved through 08/30/2024

## 2023-10-13 NOTE — Telephone Encounter (Signed)
Sruthi states she never stopped Wellsite geologist, she takes Wellsite geologist 3mg  in the am and Seroquel 400 mg at bedtime.

## 2023-10-13 NOTE — Telephone Encounter (Signed)
Ok, I'm sorry, I overlooked it in the chart. Continue the same meds.

## 2023-10-13 NOTE — Telephone Encounter (Signed)
Please clarify with pt. I see that she's on Seroquel, not Vraylar.

## 2023-10-17 DIAGNOSIS — Z20822 Contact with and (suspected) exposure to covid-19: Secondary | ICD-10-CM | POA: Diagnosis not present

## 2023-10-17 DIAGNOSIS — N390 Urinary tract infection, site not specified: Secondary | ICD-10-CM | POA: Diagnosis not present

## 2023-10-17 DIAGNOSIS — H6121 Impacted cerumen, right ear: Secondary | ICD-10-CM | POA: Diagnosis not present

## 2023-10-20 ENCOUNTER — Ambulatory Visit: Payer: Self-pay | Admitting: Family Medicine

## 2023-10-28 ENCOUNTER — Telehealth: Payer: Self-pay | Admitting: Physician Assistant

## 2023-10-28 DIAGNOSIS — F5105 Insomnia due to other mental disorder: Secondary | ICD-10-CM

## 2023-10-28 DIAGNOSIS — F319 Bipolar disorder, unspecified: Secondary | ICD-10-CM

## 2023-10-28 NOTE — Telephone Encounter (Signed)
 Next appt is 12/08/23. Requesting refill on Seroquel called to:  [id number] - [place], [place] - [address] Phone: [phone]  Fax: [phone]

## 2023-10-29 ENCOUNTER — Telehealth: Payer: Self-pay | Admitting: Physician Assistant

## 2023-10-29 DIAGNOSIS — F319 Bipolar disorder, unspecified: Secondary | ICD-10-CM

## 2023-10-29 DIAGNOSIS — F5105 Insomnia due to other mental disorder: Secondary | ICD-10-CM

## 2023-10-29 MED ORDER — QUETIAPINE FUMARATE 200 MG PO TABS: ORAL_TABLET | ORAL | 0 refills | Status: DC

## 2023-10-29 NOTE — Telephone Encounter (Signed)
 Robin Welch called at 9:18 requesting refill of her Concerta 36 mg.  Appt 4/9.  Send to  [id number] - [place], [place] - [address]

## 2023-10-29 NOTE — Telephone Encounter (Signed)
 Pt called back @ 10:45 stating [facility] is out of Seroquel and Concerta. Please send both to [facility] [address]

## 2023-10-29 NOTE — Telephone Encounter (Signed)
 Seroquel sent to [facility]

## 2023-11-01 ENCOUNTER — Other Ambulatory Visit: Payer: Self-pay

## 2023-11-01 ENCOUNTER — Ambulatory Visit: Payer: Medicare PPO | Admitting: Physician Assistant

## 2023-11-01 MED ORDER — QUETIAPINE FUMARATE 200 MG PO TABS: ORAL_TABLET | ORAL | 0 refills | Status: DC

## 2023-11-01 MED ORDER — METHYLPHENIDATE HCL ER (OSM) 36 MG PO TBCR: 72.0000 mg | EXTENDED_RELEASE_TABLET | Freq: Every day | ORAL | 0 refills | Status: DC

## 2023-11-01 NOTE — Telephone Encounter (Signed)
 PT called again today saying she is out of her medication. She has been waiting til Friday for the medicine. Please send it in today this am.

## 2023-11-01 NOTE — Telephone Encounter (Signed)
 Seroquel sent to [facility]. Pended Concerta to same.

## 2023-11-25 ENCOUNTER — Telehealth: Payer: Self-pay | Admitting: Physician Assistant

## 2023-11-25 NOTE — Telephone Encounter (Signed)
 Pt called asking for a refill on her concerta 36 mg 2 x a day. Pharmacy is [facility]  on [address]

## 2023-11-25 NOTE — Telephone Encounter (Signed)
 LF 3/3, due 4/1

## 2023-11-30 ENCOUNTER — Other Ambulatory Visit: Payer: Self-pay

## 2023-11-30 MED ORDER — METHYLPHENIDATE HCL ER (OSM) 36 MG PO TBCR: 72.0000 mg | EXTENDED_RELEASE_TABLET | Freq: Every day | ORAL | 0 refills | Status: DC

## 2023-11-30 NOTE — Telephone Encounter (Signed)
 Pended Concerta 36 mg, #60, to [facility]

## 2023-12-08 ENCOUNTER — Ambulatory Visit (INDEPENDENT_AMBULATORY_CARE_PROVIDER_SITE_OTHER): Payer: Medicare PPO | Admitting: Physician Assistant

## 2023-12-08 ENCOUNTER — Encounter: Payer: Self-pay | Admitting: Physician Assistant

## 2023-12-08 DIAGNOSIS — F902 Attention-deficit hyperactivity disorder, combined type: Secondary | ICD-10-CM

## 2023-12-08 DIAGNOSIS — F5105 Insomnia due to other mental disorder: Secondary | ICD-10-CM | POA: Diagnosis not present

## 2023-12-08 DIAGNOSIS — F319 Bipolar disorder, unspecified: Secondary | ICD-10-CM

## 2023-12-08 MED ORDER — METHYLPHENIDATE HCL ER (OSM) 36 MG PO TBCR: 72.0000 mg | EXTENDED_RELEASE_TABLET | Freq: Every day | ORAL | 0 refills | Status: DC

## 2023-12-08 NOTE — Progress Notes (Signed)
 [facility]  Patient ID: Robin Welch,  MRN: [id number]  PCP: Robin Crick, MD  Date of Evaluation: 12/08/2023 Time spent:25 minutes  Chief Complaint:  Chief Complaint   Depression; Insomnia; Follow-up    HISTORY/CURRENT STATUS: HPI Transfer from Robin Connor, NP who is no longer with the practice.  Robin Welch is doing well as far as her mental health medications go. She is able to enjoy things.  Energy and motivation are good most of the time.  Work is ok.  Substitute teaching. No extreme sadness, tearfulness, or feelings of hopelessness.  Sleeps well most of the time. ADLs and personal hygiene are normal.   Concerta is helpful.  States that attention is good without easy distractibility.  Able to focus on things and finish tasks to completion.  No changes in memory.  Appetite has not changed.  Weight is stable.  No complaints of anxiety.  Denies suicidal or homicidal thoughts.  Patient denies increased energy with decreased need for sleep, increased talkativeness, racing thoughts, impulsivity or risky behaviors, increased spending, increased libido, grandiosity, increased irritability or anger, paranoia, or hallucinations.  Denies dizziness, syncope, seizures, numbness, tingling, tremor, tics, unsteady gait, slurred speech, confusion. Denies muscle or joint pain, stiffness, or dystonia. Denies unexplained weight loss, frequent infections, or sores that heal slowly.  No polyphagia, polydipsia, or polyuria. Denies visual changes or paresthesias.   Individual Medical History/ Review of Systems: Changes? :No   Past Psychiatric Medication Trials: Xanax- Misused Klonopin Ativan Seroquel- Helpful for sleep and anxiety. Olanzapine Geodon- Reports that it was effective and caused involuntary movements Abilify Lithium Lamictal Depakote Buspar Hydroxyzine Gabapentin Prozac- Hives, edema Celexa- Possible allergic reaction Paxil Wellbutrin XL Wellbutrin  SR Remeron Sonata Ambien Zolpimist Lunesta Dayvigo Belsomra Trazodone Adderall-Not as effective as XR Adderall XR- Well tolerated and effective Vyvanse- Well tolerated Concerta-Effective  Allergies: Prozac [fluoxetine hcl], Doxycycline, Celexa [citalopram hydrobromide], Chantix [varenicline tartrate], Hydroxyzine, Klonopin [clonazepam], Lunesta [eszopiclone], Sonata [zaleplon], Trazodone and nefazodone, Zolpimist [zolpidem tartrate], and Azithromycin  Current Medications:  Current Outpatient Medications:    b complex vitamins capsule, Take 1 capsule by mouth daily., Disp: , Rfl:    calcium carbonate (CALCIUM 600) 600 MG TABS tablet, Take 1 tablet (600 mg total) by mouth daily with breakfast., Disp: 3 tablet, Rfl:    cariprazine (VRAYLAR) capsule, Take 3 mg by mouth daily., Disp: , Rfl:    Cholecalciferol (VITAMIN D) 50 MCG (2000 UT) CAPS, Take 1 capsule (2,000 Units total) by mouth daily. (Patient taking differently: Take 2,000 Units by mouth daily.), Disp: , Rfl:    Cyanocobalamin (VITAMIN B 12 PO), Take by mouth., Disp: , Rfl:    esomeprazole (NEXIUM) 40 MG capsule, TAKE 1 CAPSULE(40 MG) BY MOUTH DAILY, Disp: 90 capsule, Rfl: 0   MAGNESIUM PO, Take by mouth., Disp: , Rfl:    [START ON 01/26/2024] methylphenidate (CONCERTA) 36 MG PO CR tablet, Take 2 tablets (72 mg total) by mouth daily., Disp: 60 tablet, Rfl: 0   [START ON 02/25/2024] methylphenidate (CONCERTA) 36 MG PO CR tablet, Take 2 tablets (72 mg total) by mouth daily., Disp: 60 tablet, Rfl: 0   Multiple Vitamin (MULTIVITAMIN WITH MINERALS) TABS tablet, Take 1 tablet by mouth daily., Disp: , Rfl:    QUEtiapine (SEROQUEL) 200 MG tablet, TAKE 2 TO 3 TABLETS AT BEDTIME (Patient taking differently: 200-400 mg.), Disp: 270 tablet, Rfl: 0   albuterol (VENTOLIN HFA) 108 (90 Base) MCG/ACT inhaler, Inhale 2 puffs into the lungs every 6 (six) hours as needed for  wheezing or shortness of breath. (Patient not taking: Reported on 12/08/2023),  Disp: 18 g, Rfl: 3   cholestyramine light (PREVALITE) 4 g packet, Take 0.5 packets (2 g total) by mouth 2 (two) times daily. (Patient not taking: Reported on 04/15/2023), Disp: 30 packet, Rfl: 3   [START ON 12/28/2023] methylphenidate 36 MG PO CR tablet, Take 2 tablets (72 mg total) by mouth daily., Disp: 60 tablet, Rfl: 0   sucralfate (CARAFATE) 1 g tablet, TAKE 1 TABLET(1 GRAM) BY MOUTH FOUR TIMES DAILY WITH MEALS AND AT BEDTIME AS NEEDED FOR UPSET STOMACH OR REFLUX (Patient not taking: Reported on 12/08/2023), Disp: 360 tablet, Rfl: 0   Tiotropium Bromide-Olodaterol (STIOLTO RESPIMAT) 2.5-2.5 MCG/ACT AERS, Inhale 2 puffs into the lungs daily. (Patient not taking: Reported on 12/08/2023), Disp: 1 each, Rfl: 11 Medication Side Effects: none  Family Medical/ Social History: Changes? No  MENTAL HEALTH EXAM:  Last menstrual period 04/23/2015.There is no height or weight on file to calculate BMI.  General Appearance: Casual and Well Groomed  Eye Contact:  Good  Speech:  Clear and Coherent and Normal Rate  Volume:  Normal  Mood:  Euthymic  Affect:  Congruent  Thought Process:  Goal Directed and Descriptions of Associations: Circumstantial  Orientation:  Full (Time, Place, and Person)  Thought Content: Logical   Suicidal Thoughts:  No  Homicidal Thoughts:  No  Memory:  WNL  Judgement:  Good  Insight:  Good  Psychomotor Activity:  Normal  Concentration:  Concentration: Good and Attention Span: Good  Recall:  Good  Fund of Knowledge: Good  Language: Good  Assets:  Communication Skills Desire for Improvement Financial Resources/Insurance Housing Transportation Vocational/Educational  ADL's:  Intact  Cognition: WNL  Prognosis:  Good   PCP follows labs.  DIAGNOSES:    ICD-10-CM   1. Bipolar I disorder (HCC)  F31.9     2. Attention deficit hyperactivity disorder (ADHD), combined type  F90.2     3. 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  Concerta filled 11/30/2023. I provided 25 minutes of face to face time during this encounter, including time spent before and after the visit in records review, medical decision making, counseling pertinent to today's visit, and charting.   She is doing well on current medications so no changes need to be made.  Continue Vraylar 3 mg, 1 p.o. daily. Continue Concerta 36 mg, 2 p.o. every morning. Continue Seroquel 200 mg, 2-3 p.o. nightly. Continue vitamins and supplements as per med list. Return in 6 months.  Marvia Slocumb, PA-C

## 2024-02-23 ENCOUNTER — Telehealth: Payer: Self-pay | Admitting: Physician Assistant

## 2024-02-23 ENCOUNTER — Other Ambulatory Visit: Payer: Self-pay

## 2024-02-23 MED ORDER — METHYLPHENIDATE HCL ER (OSM) 36 MG PO TBCR: 72.0000 mg | EXTENDED_RELEASE_TABLET | Freq: Every day | ORAL | 0 refills | Status: DC

## 2024-02-23 NOTE — Telephone Encounter (Signed)
 Yes, that's fine

## 2024-02-23 NOTE — Telephone Encounter (Signed)
 Pt picked up Concerta  5/28, RF due 6/27. She said she is leaving for [place] tomorrow and is asking to fill today instead.

## 2024-02-23 NOTE — Telephone Encounter (Signed)
 Pharmacy said they couldn't change date on Rx and need to send new one. Did pend new Rx to [facility] for fill today.

## 2024-02-23 NOTE — Telephone Encounter (Signed)
 Pt called at 9:15a.  She is asking if she can get the Concerta  today instead of 6/27.  She is going to [place] on vacation at [employer].  No upcoming appt scheduled.

## 2024-03-15 DIAGNOSIS — H524 Presbyopia: Secondary | ICD-10-CM | POA: Diagnosis not present

## 2024-03-15 DIAGNOSIS — H2513 Age-related nuclear cataract, bilateral: Secondary | ICD-10-CM | POA: Diagnosis not present

## 2024-03-15 DIAGNOSIS — H5203 Hypermetropia, bilateral: Secondary | ICD-10-CM | POA: Diagnosis not present

## 2024-03-15 DIAGNOSIS — H52223 Regular astigmatism, bilateral: Secondary | ICD-10-CM | POA: Diagnosis not present

## 2024-04-14 ENCOUNTER — Telehealth: Payer: Self-pay | Admitting: Physician Assistant

## 2024-04-14 NOTE — Telephone Encounter (Signed)
 LF 7/24, due 8/21 based on PMPD dates.

## 2024-04-14 NOTE — Telephone Encounter (Signed)
 Pt needs refill on Concerta . Wants to know if she can get it on the 21st, she is going out of town.   Arloa Prior on [employer]

## 2024-04-17 ENCOUNTER — Telehealth: Payer: Self-pay | Admitting: Physician Assistant

## 2024-04-17 NOTE — Telephone Encounter (Signed)
 Pt is needing to get some vraylar  3 mg samples

## 2024-04-18 NOTE — Telephone Encounter (Signed)
 Pt gets patient assistance but order has not arrived. She is going out of town on Thursday and wants one box of samples in case it doesn't arrive. Pulled samples and she is aware.

## 2024-04-20 ENCOUNTER — Other Ambulatory Visit: Payer: Self-pay

## 2024-04-20 DIAGNOSIS — F902 Attention-deficit hyperactivity disorder, combined type: Secondary | ICD-10-CM

## 2024-04-20 MED ORDER — METHYLPHENIDATE HCL ER (OSM) 36 MG PO TBCR: 72.0000 mg | EXTENDED_RELEASE_TABLET | Freq: Every day | ORAL | 0 refills | Status: DC

## 2024-04-20 NOTE — Telephone Encounter (Signed)
 Pended

## 2024-05-09 ENCOUNTER — Telehealth: Payer: Self-pay

## 2024-05-15 ENCOUNTER — Telehealth: Payer: Self-pay | Admitting: Physician Assistant

## 2024-05-15 NOTE — Telephone Encounter (Signed)
 Patient came into office to drop off insurance information regarding Concerta  36mg . States that insurance will only fill a 30 day suppply. She needs prescription for 60 day supply as she take medication twice a day. 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 box. Pls call [phone]

## 2024-05-16 NOTE — Telephone Encounter (Signed)
 Pt approved. LVM with info.

## 2024-05-16 NOTE — Telephone Encounter (Signed)
 PA initiated for methylphenidate  36 mg x 2.

## 2024-05-16 NOTE — Telephone Encounter (Addendum)
 Prior Authorization Methylphenidate  72 mg - 36 x2 #60/30 [employer]  Approved Effective Date: 03/31/2024 Authorization Expiration Date: 08/30/2024

## 2024-05-22 ENCOUNTER — Telehealth (INDEPENDENT_AMBULATORY_CARE_PROVIDER_SITE_OTHER): Admitting: Physician Assistant

## 2024-05-22 ENCOUNTER — Encounter: Payer: Self-pay | Admitting: Physician Assistant

## 2024-05-22 DIAGNOSIS — F902 Attention-deficit hyperactivity disorder, combined type: Secondary | ICD-10-CM | POA: Diagnosis not present

## 2024-05-22 DIAGNOSIS — F5105 Insomnia due to other mental disorder: Secondary | ICD-10-CM | POA: Diagnosis not present

## 2024-05-22 DIAGNOSIS — F319 Bipolar disorder, unspecified: Secondary | ICD-10-CM | POA: Diagnosis not present

## 2024-05-22 MED ORDER — METHYLPHENIDATE HCL ER (OSM) 36 MG PO TBCR: 72.0000 mg | EXTENDED_RELEASE_TABLET | Freq: Every day | ORAL | 0 refills | Status: AC

## 2024-05-22 MED ORDER — METHYLPHENIDATE HCL ER (OSM) 36 MG PO TBCR: 72.0000 mg | EXTENDED_RELEASE_TABLET | Freq: Every day | ORAL | 0 refills | Status: DC

## 2024-05-22 MED ORDER — QUETIAPINE FUMARATE 200 MG PO TABS: 200.0000 mg | ORAL_TABLET | Freq: Every day | ORAL | 1 refills | Status: DC

## 2024-05-22 NOTE — Progress Notes (Signed)
 [facility]  Patient ID: Robin Welch,  MRN: [id number]  PCP: Rilla Baller, MD  Date of Evaluation: 05/22/2024 Time spent:20 minutes  Chief Complaint:  Chief Complaint   ADHD; Insomnia; Follow-up   Virtual Visit via Telehealth  I connected with patient by a video enabled telemedicine application with their informed consent, and verified patient privacy and that I am speaking with the correct person using two identifiers.  I am private, in my office and the patient is at home.  I discussed the limitations, risks, security and privacy concerns of performing an evaluation and management service by video and the availability of in person appointments. I also discussed with the patient that there may be a patient responsible charge related to this service. The patient expressed understanding and agreed to proceed.   I discussed the assessment and treatment plan with the patient. The patient was provided an opportunity to ask questions and all were answered. The patient agreed with the plan and demonstrated an understanding of the instructions.   The patient was advised to call back or seek an in-person evaluation if the symptoms worsen or if the condition fails to improve as anticipated.  I provided approximately 20  minutes of non-face-to-face time during this encounter.  HISTORY/CURRENT STATUS: HPI  For 6 month med check.  Robin Welch is doing well.  Feels like all meds are working as intended.  Concerta  is still effective. States that attention is good without easy distractibility.  Able to focus on things and finish tasks to completion.   Patient is able to enjoy things.  Energy and motivation are good.  Work is going well.  Travels a lot.  No extreme sadness, tearfulness, or feelings of hopelessness.  Sleeps well with the Seroquel . ADLs and personal hygiene are normal.   Appetite has not changed.  Weight is stable.  No c/o anxiety.  No mania, delirium, AH/VH.  No  SI/HI.  Individual Medical History/ Review of Systems: Changes? :No   Past Psychiatric Medication Trials: Xanax - Misused Klonopin  Ativan  Seroquel - Helpful for sleep and anxiety. Olanzapine  Geodon - Reports that it was effective and caused involuntary movements Abilify Lithium  Lamictal  Depakote Buspar  Hydroxyzine  Gabapentin  Prozac- Hives, edema Celexa- Possible allergic reaction Paxil Wellbutrin  XL Wellbutrin  SR Remeron  Sonata Ambien Zolpimist Lunesta Dayvigo  Belsomra  Trazodone Adderall-Not as effective as XR Adderall XR- Well tolerated and effective Vyvanse- Well tolerated Concerta -Effective  Allergies: Prozac [fluoxetine hcl], Doxycycline , Celexa [citalopram hydrobromide], Chantix  [varenicline  tartrate], Hydroxyzine , Klonopin  [clonazepam ], Lunesta [eszopiclone], Sonata [zaleplon], Trazodone and nefazodone, Zolpimist [zolpidem tartrate], and Azithromycin   Current Medications:  Current Outpatient Medications:    albuterol  (VENTOLIN  HFA) 108 (90 Base) MCG/ACT inhaler, Inhale 2 puffs into the lungs every 6 (six) hours as needed for wheezing or shortness of breath. (Patient not taking: Reported on 12/08/2023), Disp: 18 g, Rfl: 3   b complex vitamins capsule, Take 1 capsule by mouth daily., Disp: , Rfl:    calcium  carbonate (CALCIUM  600) 600 MG TABS tablet, Take 1 tablet (600 mg total) by mouth daily with breakfast., Disp: 3 tablet, Rfl:    cariprazine  (VRAYLAR ) capsule, Take 3 mg by mouth daily., Disp: , Rfl:    Cholecalciferol  (VITAMIN D ) 50 MCG (2000 UT) CAPS, Take 1 capsule (2,000 Units total) by mouth daily. (Patient taking differently: Take 2,000 Units by mouth daily.), Disp: , Rfl:    cholestyramine  light (PREVALITE ) 4 g packet, Take 0.5 packets (2 g total) by mouth 2 (two) times daily. (Patient not taking: Reported on 04/15/2023), Disp: 30 packet,  Rfl: 3   Cyanocobalamin  (VITAMIN B 12 PO), Take by mouth., Disp: , Rfl:    esomeprazole  (NEXIUM ) 40 MG capsule, TAKE 1  CAPSULE(40 MG) BY MOUTH DAILY, Disp: 90 capsule, Rfl: 0   MAGNESIUM  PO, Take by mouth., Disp: , Rfl:    [START ON 06/20/2024] methylphenidate  (CONCERTA ) 36 MG PO CR tablet, Take 2 tablets (72 mg total) by mouth daily., Disp: 60 tablet, Rfl: 0   [START ON 07/20/2024] methylphenidate  (CONCERTA ) 36 MG PO CR tablet, Take 2 tablets (72 mg total) by mouth daily., Disp: 60 tablet, Rfl: 0   methylphenidate  36 MG PO CR tablet, Take 2 tablets (72 mg total) by mouth daily., Disp: 60 tablet, Rfl: 0   Multiple Vitamin (MULTIVITAMIN WITH MINERALS) TABS tablet, Take 1 tablet by mouth daily., Disp: , Rfl:    QUEtiapine  (SEROQUEL ) 200 MG tablet, Take 1-2 tablets (200-400 mg total) by mouth at bedtime., Disp: 180 tablet, Rfl: 1   sucralfate  (CARAFATE ) 1 g tablet, TAKE 1 TABLET(1 GRAM) BY MOUTH FOUR TIMES DAILY WITH MEALS AND AT BEDTIME AS NEEDED FOR UPSET STOMACH OR REFLUX (Patient not taking: Reported on 12/08/2023), Disp: 360 tablet, Rfl: 0   Tiotropium Bromide -Olodaterol (STIOLTO RESPIMAT ) 2.5-2.5 MCG/ACT AERS, Inhale 2 puffs into the lungs daily. (Patient not taking: Reported on 12/08/2023), Disp: 1 each, Rfl: 11 Medication Side Effects: none  Family Medical/ Social History: Changes? No  MENTAL HEALTH EXAM:  Last menstrual period 04/23/2015.There is no height or weight on file to calculate BMI.  General Appearance: Casual and Well Groomed  Eye Contact:  Good  Speech:  Clear and Coherent and Normal Rate  Volume:  Normal  Mood:  Euthymic  Affect:  Congruent  Thought Process:  Goal Directed and Descriptions of Associations: Circumstantial  Orientation:  Full (Time, Place, and Person)  Thought Content: Logical   Suicidal Thoughts:  No  Homicidal Thoughts:  No  Memory:  WNL  Judgement:  Good  Insight:  Good  Psychomotor Activity:  Normal  Concentration:  Concentration: Good and Attention Span: Good  Recall:  Good  Fund of Knowledge: Good  Language: Good  Assets:  Communication Skills Desire for  Improvement Financial Resources/Insurance Housing Resilience Transportation Vocational/Educational  ADL's:  Intact  Cognition: WNL  Prognosis:  Good   PCP follows labs.  DIAGNOSES:    ICD-10-CM   1. Attention deficit hyperactivity disorder (ADHD), combined type  F90.2 methylphenidate  (CONCERTA ) 36 MG PO CR tablet    2. Bipolar I disorder (HCC)  F31.9 QUEtiapine  (SEROQUEL ) 200 MG tablet    3. Insomnia due to mental disorder  F51.05 QUEtiapine  (SEROQUEL ) 200 MG tablet     Receiving Psychotherapy: No   RECOMMENDATIONS:   PDMP reviewed.  Concerta  filled 04/20/2024. I provided approximately 20 minutes of non-face-to-face time during this encounter, including time spent before and after the visit in records review, medical decision making, counseling pertinent to today's visit, and charting.   She's doing well so no changes need to be made.   Continue Vraylar  3 mg, 1 p.o. daily.  Continue Concerta  36 mg, 2 p.o. every morning. Continue Seroquel  200 mg,  1-2 p.o. nightly. Continue vitamins and supplements as per med list. Return in 6 months.  Verneita Cooks, PA-C

## 2024-06-23 ENCOUNTER — Telehealth: Payer: Self-pay | Admitting: Physician Assistant

## 2024-06-23 NOTE — Telephone Encounter (Signed)
 Pt called at 3:22p requesting samples she thinks of Austedo .  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  Pt's daughter is a Teacher, early years/pre and she told her she didn't think she should use it, but pt is saying the symptoms are terrible for her in the morning.  She wanted to know if you would give her samples before she can see you again.  She said she can't make the urgent appt on this Tuesday as she will be out of town.    No upcoming appt scheduled.

## 2024-06-26 NOTE — Telephone Encounter (Signed)
 Please see message. No TD sx mentioned in last note.  Do you want an appt to evaluate TD sx.   Per 03/2024 note from [place]:  She reports that she has not continued on Austedo  XR after completing samples. She reports that she has not observed signs of TD and no one has commented on any involuntary movements. Discussed that TD may have been more noticeable several months ago due to paradoxical effect of quickly withdrawing an atypical antipsychotic.

## 2024-06-26 NOTE — Telephone Encounter (Signed)
 Yes, needs to make an appt. Not urgent.

## 2024-06-26 NOTE — Telephone Encounter (Signed)
 Please call to schedule an appt to discuss TD and request for medication. Per Verneita it is not urgent.

## 2024-06-27 NOTE — Telephone Encounter (Signed)
 Lvm for pt to call and schedule

## 2024-08-14 ENCOUNTER — Telehealth: Payer: Self-pay | Admitting: Physician Assistant

## 2024-08-14 NOTE — Telephone Encounter (Signed)
 LF 11/20, due 12/19

## 2024-08-14 NOTE — Telephone Encounter (Signed)
 Next appt is 09/12/24. Requesting a refill for Concerta  236 mg called to:  [id number] - [place], [place] - [address]   Phone: [phone]  Fax: [phone]

## 2024-08-17 ENCOUNTER — Other Ambulatory Visit: Payer: Self-pay

## 2024-08-17 ENCOUNTER — Other Ambulatory Visit: Payer: Self-pay | Admitting: Physician Assistant

## 2024-08-17 DIAGNOSIS — F902 Attention-deficit hyperactivity disorder, combined type: Secondary | ICD-10-CM

## 2024-08-17 MED ORDER — METHYLPHENIDATE HCL ER (OSM) 36 MG PO TBCR: 72.0000 mg | EXTENDED_RELEASE_TABLET | Freq: Every day | ORAL | 0 refills | Status: DC

## 2024-08-17 NOTE — Telephone Encounter (Signed)
 Patient called again stating that prescription was sent to wrong pharmacy and she now needs it sent to [facility] [address] [place], [place]

## 2024-08-17 NOTE — Telephone Encounter (Signed)
 LF 11/20, due 12/19. Has asked for early RF multiple times.

## 2024-08-17 NOTE — Telephone Encounter (Signed)
 Needs to fill the concerta  today because she in traveling.

## 2024-08-17 NOTE — Telephone Encounter (Signed)
 RF was sent to the pharmacy listed.

## 2024-08-17 NOTE — Telephone Encounter (Signed)
 I sent it in w/ ok to fill today, since it's close enough.

## 2024-08-17 NOTE — Telephone Encounter (Signed)
 Repended to [facility] pharmacy. Need to cancel at [facility] [phone].

## 2024-08-18 MED ORDER — METHYLPHENIDATE HCL ER (OSM) 36 MG PO TBCR: 72.0000 mg | EXTENDED_RELEASE_TABLET | Freq: Every day | ORAL | 0 refills | Status: DC

## 2024-08-18 NOTE — Telephone Encounter (Signed)
 Canceled Rx sent yesterday for Concerta  to [facility].

## 2024-09-12 ENCOUNTER — Telehealth: Admitting: Physician Assistant

## 2024-09-12 ENCOUNTER — Encounter: Payer: Self-pay | Admitting: Physician Assistant

## 2024-09-12 DIAGNOSIS — F319 Bipolar disorder, unspecified: Secondary | ICD-10-CM

## 2024-09-12 DIAGNOSIS — F902 Attention-deficit hyperactivity disorder, combined type: Secondary | ICD-10-CM

## 2024-09-12 MED ORDER — METHYLPHENIDATE HCL ER (OSM) 36 MG PO TBCR: 72.0000 mg | EXTENDED_RELEASE_TABLET | Freq: Every day | ORAL | 0 refills | Status: AC
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
## 2024-09-20 ENCOUNTER — Ambulatory Visit: Admitting: Family Medicine

## 2024-09-20 ENCOUNTER — Encounter: Payer: Self-pay | Admitting: Family Medicine

## 2024-09-20 VITALS — BP 118/80 | HR 68 | Temp 97.7°F | Ht 60.5 in | Wt 107.4 lb

## 2024-09-20 DIAGNOSIS — K219 Gastro-esophageal reflux disease without esophagitis: Secondary | ICD-10-CM | POA: Diagnosis not present

## 2024-09-20 DIAGNOSIS — E871 Hypo-osmolality and hyponatremia: Secondary | ICD-10-CM | POA: Diagnosis not present

## 2024-09-20 DIAGNOSIS — F1421 Cocaine dependence, in remission: Secondary | ICD-10-CM | POA: Diagnosis not present

## 2024-09-20 DIAGNOSIS — F319 Bipolar disorder, unspecified: Secondary | ICD-10-CM

## 2024-09-20 DIAGNOSIS — D500 Iron deficiency anemia secondary to blood loss (chronic): Secondary | ICD-10-CM

## 2024-09-20 DIAGNOSIS — Z720 Tobacco use: Secondary | ICD-10-CM | POA: Diagnosis not present

## 2024-09-20 DIAGNOSIS — M8000XG Age-related osteoporosis with current pathological fracture, unspecified site, subsequent encounter for fracture with delayed healing: Secondary | ICD-10-CM | POA: Diagnosis not present

## 2024-09-20 DIAGNOSIS — D649 Anemia, unspecified: Secondary | ICD-10-CM | POA: Diagnosis not present

## 2024-09-20 DIAGNOSIS — S52501S Unspecified fracture of the lower end of right radius, sequela: Secondary | ICD-10-CM

## 2024-09-20 DIAGNOSIS — Z8719 Personal history of other diseases of the digestive system: Secondary | ICD-10-CM

## 2024-09-20 DIAGNOSIS — Z13220 Encounter for screening for lipoid disorders: Secondary | ICD-10-CM | POA: Diagnosis not present

## 2024-09-20 DIAGNOSIS — E538 Deficiency of other specified B group vitamins: Secondary | ICD-10-CM | POA: Diagnosis not present

## 2024-09-20 DIAGNOSIS — S42201S Unspecified fracture of upper end of right humerus, sequela: Secondary | ICD-10-CM

## 2024-09-20 DIAGNOSIS — Z8781 Personal history of (healed) traumatic fracture: Secondary | ICD-10-CM

## 2024-09-20 DIAGNOSIS — J4489 Other specified chronic obstructive pulmonary disease: Secondary | ICD-10-CM

## 2024-09-20 DIAGNOSIS — F5105 Insomnia due to other mental disorder: Secondary | ICD-10-CM

## 2024-09-20 LAB — CBC WITH DIFFERENTIAL/PLATELET
Basophils Absolute: 0 K/uL (ref 0.0–0.1)
Basophils Relative: 0.5 % (ref 0.0–3.0)
Eosinophils Absolute: 0.1 K/uL (ref 0.0–0.7)
Eosinophils Relative: 1.8 % (ref 0.0–5.0)
HCT: 38.5 % (ref 36.0–46.0)
Hemoglobin: 12.9 g/dL (ref 12.0–15.0)
Lymphocytes Relative: 21.8 % (ref 12.0–46.0)
Lymphs Abs: 1.4 K/uL (ref 0.7–4.0)
MCHC: 33.4 g/dL (ref 30.0–36.0)
MCV: 91.5 fl (ref 78.0–100.0)
Monocytes Absolute: 0.6 K/uL (ref 0.1–1.0)
Monocytes Relative: 9.8 % (ref 3.0–12.0)
Neutro Abs: 4.3 K/uL (ref 1.4–7.7)
Neutrophils Relative %: 66.1 % (ref 43.0–77.0)
Platelets: 359 K/uL (ref 150.0–400.0)
RBC: 4.2 Mil/uL (ref 3.87–5.11)
RDW: 13.7 % (ref 11.5–15.5)
WBC: 6.5 K/uL (ref 4.0–10.5)

## 2024-09-20 LAB — COMPREHENSIVE METABOLIC PANEL WITH GFR
ALT: 18 U/L (ref 3–35)
AST: 16 U/L (ref 5–37)
Albumin: 4 g/dL (ref 3.5–5.2)
Alkaline Phosphatase: 83 U/L (ref 39–117)
BUN: 11 mg/dL (ref 6–23)
CO2: 24 meq/L (ref 19–32)
Calcium: 8.8 mg/dL (ref 8.4–10.5)
Chloride: 100 meq/L (ref 96–112)
Creatinine, Ser: 0.6 mg/dL (ref 0.40–1.20)
GFR: 97.13 mL/min
Glucose, Bld: 116 mg/dL — ABNORMAL HIGH (ref 70–99)
Potassium: 4.2 meq/L (ref 3.5–5.1)
Sodium: 131 meq/L — ABNORMAL LOW (ref 135–145)
Total Bilirubin: 0.3 mg/dL (ref 0.2–1.2)
Total Protein: 6.8 g/dL (ref 6.0–8.3)

## 2024-09-20 LAB — FERRITIN: Ferritin: 18 ng/mL (ref 10.0–291.0)

## 2024-09-20 LAB — LIPID PANEL
Cholesterol: 175 mg/dL (ref 28–200)
HDL: 78.2 mg/dL
LDL Cholesterol: 82 mg/dL (ref 10–99)
NonHDL: 96.72
Total CHOL/HDL Ratio: 2
Triglycerides: 75 mg/dL (ref 10.0–149.0)
VLDL: 15 mg/dL (ref 0.0–40.0)

## 2024-09-20 LAB — TSH: TSH: 1.88 u[IU]/mL (ref 0.35–5.50)

## 2024-09-20 LAB — IBC PANEL
Iron: 38 ug/dL — ABNORMAL LOW (ref 42–145)
Saturation Ratios: 7.2 % — ABNORMAL LOW (ref 20.0–50.0)
TIBC: 530.6 ug/dL — ABNORMAL HIGH (ref 250.0–450.0)
Transferrin: 379 mg/dL — ABNORMAL HIGH (ref 212.0–360.0)

## 2024-09-20 LAB — MAGNESIUM: Magnesium: 2 mg/dL (ref 1.5–2.5)

## 2024-09-20 LAB — VITAMIN D 25 HYDROXY (VIT D DEFICIENCY, FRACTURES): VITD: 29.73 ng/mL — ABNORMAL LOW (ref 30.00–100.00)

## 2024-09-20 LAB — VITAMIN B12: Vitamin B-12: >1500 pg/mL — ABNORMAL HIGH (ref 211–911)

## 2024-09-20 MED ORDER — OMEPRAZOLE 40 MG PO CPDR: 40.0000 mg | DELAYED_RELEASE_CAPSULE | Freq: Every day | ORAL | 3 refills | Status: AC

## 2024-09-20 MED ORDER — B-12 5000 MCG PO CAPS: 1.0000 | ORAL_CAPSULE | Freq: Every day | ORAL | Status: DC

## 2024-09-20 MED ORDER — VITAMIN D 50 MCG (2000 UT) PO CAPS: 1.0000 | ORAL_CAPSULE | ORAL | Status: AC

## 2024-09-20 NOTE — Assessment & Plan Note (Addendum)
 Endorses good milk intake.  Recommend regular calcium  and vit D supplementation.  Reviewed options - avoid oral bisphosphonate in endorsed barrett's esoph hx. Discussed prolia vs evenity  vs reclast. She states she would prefer once yearly reclast - handout provided.  Start with labwork today. Consider endocrinology referral for OP management.

## 2024-09-20 NOTE — Patient Instructions (Addendum)
 Labs today  We will be in touch with results. May start bone strengthening medicine (see handout) vs refer to osteoporosis doctor.  Good to see you today!  Return in 3 months for physical*

## 2024-09-20 NOTE — Progress Notes (Unsigned)
 " Ph: [phone] Fax: [phone]   Patient ID: Robin Welch, female    DOB: 1965/09/30, 59 y.o.   MRN: [id number]  This visit was conducted in person.  BP 118/80 (BP Location: Left Arm, Patient Position: Sitting, Cuff Size: Normal)   Pulse 68   Temp 97.7 F (36.5 C) (Oral)   Ht 5' 0.5 (1.537 m)   Wt 107 lb 6.4 oz (48.7 kg)   LMP 04/23/2015   SpO2 98%   BMI 20.63 kg/m    CC: discuss health concerns, declined physical Subjective:   HPI: Robin Welch is a 58 y.o. female presenting on 09/20/2024 for Establish Care (Pt states right wrist was broken several years ago and it has screws that need to come out, states osteoporosis in the wrist has also been bothering her//Wants to discuss inhaler and throat medication, pt states that she does not feel short of breath but sounds like it )   Last seen virtually 01/2022, in office 07/2019.  She's been using [facility] in interim.  She is working 3 jobs. She lives with son Kennyth. She is overall doing well and feels happy.  She's not fasting - only had some grape juice today.   She has upcoming GYN appt this afternoon.  She recently had teeth pulled and has f/u this afternoon. She has completed any planned dental work. She is on amoxicillin  for this.   Known bipolar 1 and ADHD followed by psychiatry Verneita Cooks PA on Concerta  72mg  daily, seroquel  (has tapered dose to 100mg  nightly), Vraylar  3mg  daily.   COPD continued smoker (1ppd) last saw [facility] pulmonology Dr Gladis 12/2022 at that time rec rpt spirometry (not completed) and started Breztri 2 puffs bid to replace Stiolto.  Today states she has not been taking any inhalers. Denies significant dyspnea or cough. Only dyspneic with climbing stairs.  PFTs 2023 - mild obstruction, FEV1 1.88L, 77% pred, mildly reduced diffusing capacity   Known severe osteoporosis with recurrent fractures including prior thoracic compression fractures. Comorbidities limited treatment options, last referred to endo 2021  but did not see them. She continues calcium  2 tablets daily.   H/o chronic hyponatremia previously related to dehydration with vomiting, psychogenic polydipsia, medications contributing.   H/o R ulnar and humeral fracture in 2020 - saw [facility], declined humeral surgery after poor healing to ulnar fracture surgery. Was told may need to have screws removed. She will return to see [facility].   Most recently suffered transverse fracture to left ring finger DIP joint after crushing finger in heavy window while closing it. Has been seeing hand surgeon Dr Romona rec DIP extension splint and 1 mo f/u. She is not wearing splint - due to increased pain while wearing.   Denies current alcohol, rec drug use.   ESOPHAGOGASTRODUODENOSCOPY 10/2020 - LA Grade D esophagitis, severe stenosis s/p dilation, 6cm HH ([facility] Dr Cloria)     Relevant past medical, surgical, family and social history reviewed and updated as indicated. Interim medical history since our last visit reviewed. Allergies and medications reviewed and updated. Outpatient Medications Prior to Visit  Medication Sig Dispense Refill   amoxicillin  (AMOXIL ) 875 MG tablet Take 875 mg by mouth 2 (two) times daily.     Calcium  Carb-Cholecalciferol  500-10 MG-MCG TABS Take 2 tablets every day by oral route.     calcium  carbonate (CALCIUM  600) 600 MG TABS tablet Take 1 tablet (600 mg total) by mouth daily with breakfast. 3 tablet    cariprazine  (VRAYLAR ) capsule Take 3 mg  by mouth daily.     chlorhexidine  (PERIDEX ) 0.12 % solution Use as directed 5 mLs in the mouth or throat 2 (two) times daily.     ibuprofen  (ADVIL ) 600 MG tablet Take 600 mg by mouth every 6 (six) hours as needed.     [START ON 10/14/2024] methylphenidate  (CONCERTA ) 36 MG PO CR tablet Take 2 tablets (72 mg total) by mouth daily. 60 tablet 0   [START ON 11/09/2024] methylphenidate  (CONCERTA ) 36 MG PO CR tablet Take 2 tablets (72 mg total) by mouth daily. 60 tablet 0   methylphenidate   36 MG PO CR tablet Take 2 tablets (72 mg total) by mouth daily. 60 tablet 0   Multiple Vitamin (MULTIVITAMIN WITH MINERALS) TABS tablet Take 1 tablet by mouth daily.     b complex vitamins capsule Take 1 capsule by mouth daily.     Cholecalciferol  (VITAMIN D ) 50 MCG (2000 UT) CAPS Take 1 capsule (2,000 Units total) by mouth daily.     cholestyramine  light (PREVALITE ) 4 g packet Take 0.5 packets (2 g total) by mouth 2 (two) times daily. 30 packet 3   Cyanocobalamin  (VITAMIN B 12 PO) Take by mouth.     esomeprazole  (NEXIUM ) 40 MG capsule TAKE 1 CAPSULE(40 MG) BY MOUTH DAILY 90 capsule 0   MAGNESIUM  PO Take by mouth.     QUEtiapine  (SEROQUEL ) 200 MG tablet Take 1-2 tablets (200-400 mg total) by mouth at bedtime. 180 tablet 1   QUEtiapine  (SEROQUEL ) 100 MG tablet Take 1 tablet (100 mg total) by mouth at bedtime.     albuterol  (VENTOLIN  HFA) 108 (90 Base) MCG/ACT inhaler Inhale 2 puffs into the lungs every 6 (six) hours as needed for wheezing or shortness of breath. (Patient not taking: Reported on 09/20/2024) 18 g 3   sucralfate  (CARAFATE ) 1 g tablet TAKE 1 TABLET(1 GRAM) BY MOUTH FOUR TIMES DAILY WITH MEALS AND AT BEDTIME AS NEEDED FOR UPSET STOMACH OR REFLUX (Patient not taking: Reported on 09/20/2024) 360 tablet 0   Tiotropium Bromide -Olodaterol (STIOLTO RESPIMAT ) 2.5-2.5 MCG/ACT AERS Inhale 2 puffs into the lungs daily. (Patient not taking: Reported on 09/20/2024) 1 each 11   No facility-administered medications prior to visit.     Per HPI unless specifically indicated in ROS section below Review of Systems  Objective:  BP 118/80 (BP Location: Left Arm, Patient Position: Sitting, Cuff Size: Normal)   Pulse 68   Temp 97.7 F (36.5 C) (Oral)   Ht 5' 0.5 (1.537 m)   Wt 107 lb 6.4 oz (48.7 kg)   LMP 04/23/2015   SpO2 98%   BMI 20.63 kg/m   Wt Readings from Last 3 Encounters:  09/20/24 107 lb 6.4 oz (48.7 kg)  01/11/23 119 lb 9.6 oz (54.3 kg)  09/22/22 140 lb (63.5 kg)      Physical  Exam Vitals and nursing note reviewed.  Constitutional:      Appearance: Normal appearance. She is not ill-appearing.  HENT:     Head: Normocephalic and atraumatic.     Mouth/Throat:     Mouth: Mucous membranes are moist.     Pharynx: Oropharynx is clear. No oropharyngeal exudate or posterior oropharyngeal erythema.  Eyes:     Extraocular Movements: Extraocular movements intact.     Conjunctiva/sclera: Conjunctivae normal.     Pupils: Pupils are equal, round, and reactive to light.  Neck:     Thyroid : No thyroid  mass or thyromegaly.  Cardiovascular:     Rate and Rhythm: Normal rate and regular  rhythm.     Pulses: Normal pulses.     Heart sounds: Normal heart sounds. No murmur heard. Pulmonary:     Effort: Pulmonary effort is normal. No respiratory distress.     Breath sounds: Normal breath sounds. No wheezing, rhonchi or rales.  Musculoskeletal:        General: Deformity present.     Cervical back: Normal range of motion and neck supple.     Right lower leg: No edema.     Left lower leg: No edema.     Comments:  R humerus and R wrist sites of prior fractures/surgery Thoracic deformity from prior compression fractures  Skin:    General: Skin is warm and dry.     Findings: No rash.  Neurological:     Mental Status: She is alert.  Psychiatric:        Mood and Affect: Mood normal.        Behavior: Behavior normal.       Results for orders placed or performed in visit on 09/20/24  Comprehensive metabolic panel with GFR   Collection Time: 09/20/24 10:12 AM  Result Value Ref Range   Sodium 131 (L) 135 - 145 mEq/L   Potassium 4.2 3.5 - 5.1 mEq/L   Chloride 100 96 - 112 mEq/L   CO2 24 19 - 32 mEq/L   Glucose, Bld 116 (H) 70 - 99 mg/dL   BUN 11 6 - 23 mg/dL   Creatinine, Ser 9.39 0.40 - 1.20 mg/dL   Total Bilirubin 0.3 0.2 - 1.2 mg/dL   Alkaline Phosphatase 83 39 - 117 U/L   AST 16 5 - 37 U/L   ALT 18 3 - 35 U/L   Total Protein 6.8 6.0 - 8.3 g/dL   Albumin 4.0 3.5 - 5.2  g/dL   GFR 02.86 >39.99 mL/min   Calcium  8.8 8.4 - 10.5 mg/dL  Vitamin A87   Collection Time: 09/20/24 10:12 AM  Result Value Ref Range   Vitamin B-12 >1500 (H) 211 - 911 pg/mL  VITAMIN D  25 Hydroxy (Vit-D Deficiency, Fractures)   Collection Time: 09/20/24 10:12 AM  Result Value Ref Range   VITD 29.73 (L) 30.00 - 100.00 ng/mL  Magnesium    Collection Time: 09/20/24 10:12 AM  Result Value Ref Range   Magnesium  2.0 1.5 - 2.5 mg/dL  Lipid panel   Collection Time: 09/20/24 10:12 AM  Result Value Ref Range   Cholesterol 175 28 - 200 mg/dL   Triglycerides 24.9 89.9 - 149.0 mg/dL   HDL 21.79 >60.99 mg/dL   VLDL 84.9 0.0 - 59.9 mg/dL   LDL Cholesterol 82 10 - 99 mg/dL   Total CHOL/HDL Ratio 2    NonHDL 96.72   CBC with Differential/Platelet   Collection Time: 09/20/24 10:12 AM  Result Value Ref Range   WBC 6.5 4.0 - 10.5 K/uL   RBC 4.20 3.87 - 5.11 Mil/uL   Hemoglobin 12.9 12.0 - 15.0 g/dL   HCT 61.4 63.9 - 53.9 %   MCV 91.5 78.0 - 100.0 fl   MCHC 33.4 30.0 - 36.0 g/dL   RDW 86.2 88.4 - 84.4 %   Platelets 359.0 150.0 - 400.0 K/uL   Neutrophils Relative % 66.1 43.0 - 77.0 %   Lymphocytes Relative 21.8 12.0 - 46.0 %   Monocytes Relative 9.8 3.0 - 12.0 %   Eosinophils Relative 1.8 0.0 - 5.0 %   Basophils Relative 0.5 0.0 - 3.0 %   Neutro Abs 4.3 1.4 - 7.7  K/uL   Lymphs Abs 1.4 0.7 - 4.0 K/uL   Monocytes Absolute 0.6 0.1 - 1.0 K/uL   Eosinophils Absolute 0.1 0.0 - 0.7 K/uL   Basophils Absolute 0.0 0.0 - 0.1 K/uL  Ferritin   Collection Time: 09/20/24 10:12 AM  Result Value Ref Range   Ferritin 18.0 10.0 - 291.0 ng/mL  IBC panel   Collection Time: 09/20/24 10:12 AM  Result Value Ref Range   Iron 38 (L) 42 - 145 ug/dL   Transferrin 620.9 (H) 212.0 - 360.0 mg/dL   Saturation Ratios 7.2 (L) 20.0 - 50.0 %   TIBC 530.6 (H) 250.0 - 450.0 mcg/dL  TSH   Collection Time: 09/20/24 10:12 AM  Result Value Ref Range   TSH 1.88 0.35 - 5.50 uIU/mL     Assessment & Plan:   Problem  List Items Addressed This Visit     Tobacco abuse   Continued 1ppd smoker since teenager.  Encouraged full cessation. Contemplative. She agrees to referral back to lung cancer screening program.       Relevant Orders   Ambulatory Referral Lung Cancer Screening [facility]   Bipolar 1 disorder (HCC)   Overall stable period on current regimen - appreciate psych care. Continue vraylar , concerta , seroquel .       Relevant Medications   QUEtiapine  (SEROQUEL ) 100 MG tablet   COPD (chronic obstructive pulmonary disease) with chronic bronchitis (HCC)   Chronic but stable, continued smoker, respiratory status overall stable off respiratory medications at this time.  Last saw pulm 12/2022.       History of cocaine dependence (HCC)   Abstinent       Chronic hyponatremia   H/o chronic hyponatremia. Update labs today.      Relevant Orders   Comprehensive metabolic panel with GFR (Completed)   Iron deficiency anemia   Trouble tolerating oral iron  Update labs today.       Relevant Medications   Cyanocobalamin  (B-12) 5000 MCG CAPS   Other Relevant Orders   CBC with Differential/Platelet (Completed)   Ferritin (Completed)   IBC panel (Completed)   Gastroesophageal reflux disease   Chronic, severe, h/o barrett's - now regular with PPI omeprazole  40mg  daily with good effect  Looks like lat EGD was 2022 at [facility] - may need GI f/u      Relevant Medications   omeprazole  (PRILOSEC) 40 MG capsule   Osteoporosis - Primary   Endorses good dairy intake.  Recommend continue regular calcium  and vit D supplementation.  She is chronically on omeprazole .  Previous treatment delayed due to other medical issues including hypocalcemia.  Reviewed options - avoid oral bisphosphonate in h/o barrett's esophagitis, h/o esoph stricture and severe GERD. Discussed options including prolia vs evenity  vs reclast. She states she would prefer once yearly reclast - handout provided.  Start with labwork  today. Consider endocrinology referral for OP management.       Relevant Medications   Calcium  Carb-Cholecalciferol  500-10 MG-MCG TABS   Cholecalciferol  (VITAMIN D ) 50 MCG (2000 UT) CAPS   Other Relevant Orders   Parathyroid  hormone, intact (no Ca)   VITAMIN D  25 Hydroxy (Vit-D Deficiency, Fractures) (Completed)   Magnesium  (Completed)   TSH (Completed)   Low serum vitamin B12   Update levels on b12 5000 units daily.       Relevant Orders   Vitamin B12 (Completed)   Closed fracture distal radius and ulna, right, sequela   Comminuted fracture to R wrist s/p ORIF 04/2019 with poor healing,  has been told may need to have screws removed. Will f/u with ortho.       Closed fracture of right proximal humerus   Treated non-operatively.  Still painful.  Will f/u with [facility].       History of GI bleed   Anemia   Update labs today.       Relevant Medications   Cyanocobalamin  (B-12) 5000 MCG CAPS   Other Relevant Orders   CBC with Differential/Platelet (Completed)   Ferritin (Completed)   IBC panel (Completed)   History of vertebral compression fracture   Other Visit Diagnoses       Encounter for lipid screening for cardiovascular disease       Relevant Orders   Lipid panel (Completed)        Meds ordered this encounter  Medications   Cyanocobalamin  (B-12) 5000 MCG CAPS    Sig: Take 1 capsule by mouth daily.   Cholecalciferol  (VITAMIN D ) 50 MCG (2000 UT) CAPS    Sig: Take 1 capsule (2,000 Units total) by mouth every Monday, Wednesday, and Friday.   omeprazole  (PRILOSEC) 40 MG capsule    Sig: Take 1 capsule (40 mg total) by mouth daily.    Dispense:  90 capsule    Refill:  3    Orders Placed This Encounter  Procedures   Comprehensive metabolic panel with GFR   Vitamin B12   Parathyroid  hormone, intact (no Ca)   VITAMIN D  25 Hydroxy (Vit-D Deficiency, Fractures)   Magnesium    Lipid panel   CBC with Differential/Platelet   Ferritin   IBC panel   TSH    Ambulatory Referral Lung Cancer Screening [facility]    Referral Priority:   Routine    Referral Type:   Consultation    Referral Reason:   Specialty Services Required    Number of Visits Requested:   1    Patient Instructions  Labs today  We will be in touch with results. May start bone strengthening medicine (see handout) vs refer to osteoporosis doctor.  Good to see you today!  Return in 3 months for physical*  Follow up plan: Return in about 3 months (around 12/19/2024) for annual exam, prior fasting for blood work.  Anton Blas, MD   "

## 2024-09-20 NOTE — Assessment & Plan Note (Signed)
 Continued 1ppd smoker since teenager.  Encouraged full cessation. Contemplative. She agrees to referral back to lung cancer screening program.

## 2024-09-21 ENCOUNTER — Encounter: Payer: Self-pay | Admitting: Family Medicine

## 2024-09-21 ENCOUNTER — Ambulatory Visit: Payer: Self-pay | Admitting: Family Medicine

## 2024-09-21 ENCOUNTER — Telehealth: Payer: Self-pay | Admitting: Family Medicine

## 2024-09-21 DIAGNOSIS — M8000XG Age-related osteoporosis with current pathological fracture, unspecified site, subsequent encounter for fracture with delayed healing: Secondary | ICD-10-CM

## 2024-09-21 LAB — PARATHYROID HORMONE, INTACT (NO CA): PTH: 16 pg/mL (ref 16–77)

## 2024-09-21 MED ORDER — ROMOSOZUMAB-AQQG 105 MG/1.17ML ~~LOC~~ SOSY: 210.0000 mg | PREFILLED_SYRINGE | Freq: Once | SUBCUTANEOUS | Status: AC

## 2024-09-21 NOTE — Addendum Note (Signed)
 Addended by: SEBASTIAN DANNA GRADE on: 09/21/2024 03:30 PM   Modules accepted: Orders

## 2024-09-21 NOTE — Telephone Encounter (Signed)
 Unable to reach pt at this time, left voicemail instructing pt to call back for lab results.  Please provide message from provider/office when call is returned from patient.

## 2024-09-21 NOTE — Assessment & Plan Note (Signed)
 Trouble tolerating oral iron  Update labs today.

## 2024-09-21 NOTE — Assessment & Plan Note (Signed)
"   Update labs today          "

## 2024-09-21 NOTE — Assessment & Plan Note (Signed)
 H/o chronic hyponatremia. Update labs today.

## 2024-09-21 NOTE — Assessment & Plan Note (Signed)
 Overall stable period on current regimen - appreciate psych care. Continue vraylar , concerta , seroquel .

## 2024-09-21 NOTE — Assessment & Plan Note (Signed)
 Update levels on b12 5000 units daily.

## 2024-09-21 NOTE — Assessment & Plan Note (Signed)
 Comminuted fracture to R wrist s/p ORIF 04/2019 with poor healing, has been told may need to have screws removed. Will f/u with ortho.

## 2024-09-21 NOTE — Assessment & Plan Note (Addendum)
 Chronic, severe, h/o barrett's - now regular with PPI omeprazole  40mg  daily with good effect  Looks like lat EGD was 2022 at [facility] - may need GI f/u

## 2024-09-21 NOTE — Assessment & Plan Note (Signed)
 Treated non-operatively.  Still painful.  Will f/u with [facility].

## 2024-09-21 NOTE — Telephone Encounter (Signed)
 I'd like to start patient on monthly Evenity  x12 months - how do we go about getting this set up?

## 2024-09-21 NOTE — Assessment & Plan Note (Signed)
Abstinent  

## 2024-09-21 NOTE — Telephone Encounter (Unsigned)
 Copied from CRM #[id number]. Topic: Clinical - Lab/Test Results >> Sep 21, 2024 12:59 PM Terri G wrote: Reason for CRM: Patient returning Dr.G call about her lab results.

## 2024-09-21 NOTE — Assessment & Plan Note (Addendum)
 Chronic but stable, continued smoker, respiratory status overall stable off respiratory medications at this time.  Last saw pulm 12/2022.

## 2024-09-22 ENCOUNTER — Telehealth: Payer: Self-pay

## 2024-09-22 DIAGNOSIS — M8000XG Age-related osteoporosis with current pathological fracture, unspecified site, subsequent encounter for fracture with delayed healing: Secondary | ICD-10-CM

## 2024-09-22 NOTE — Telephone Encounter (Signed)
 Evenity  VOB initiated via [email or web address]  Last OV:  Next OV:  Last Evenity  inj:  Next Evenity  inj DUE: NEW START

## 2024-09-25 NOTE — Telephone Encounter (Signed)
 Unable to reach pt at this time, left voicemail instructing pt to call back for lab results.  Please provide message from provider/office when call is returned from patient.

## 2024-09-26 NOTE — Telephone Encounter (Deleted)
 SABRA

## 2024-09-27 ENCOUNTER — Telehealth: Payer: Self-pay

## 2024-09-27 ENCOUNTER — Other Ambulatory Visit (HOSPITAL_COMMUNITY): Payer: Self-pay

## 2024-09-27 DIAGNOSIS — Z122 Encounter for screening for malignant neoplasm of respiratory organs: Secondary | ICD-10-CM

## 2024-09-27 DIAGNOSIS — F17210 Nicotine dependence, cigarettes, uncomplicated: Secondary | ICD-10-CM

## 2024-09-27 DIAGNOSIS — Z87891 Personal history of nicotine dependence: Secondary | ICD-10-CM

## 2024-09-27 MED ORDER — ROMOSOZUMAB-AQQG 105 MG/1.17ML ~~LOC~~ SOSY: 210.0000 mg | PREFILLED_SYRINGE | Freq: Once | SUBCUTANEOUS | Status: AC

## 2024-09-27 NOTE — Telephone Encounter (Signed)
 Per PA, Jubbonti, Prolia, and Stoboclo are preferred. Medical justification is needed to proceed with Evenity .      Pharmacy test claim: $1252.90 (Transition fill - insurance will allow 1 fill then PA is required)

## 2024-09-27 NOTE — Addendum Note (Signed)
 Addended by: ALBINO SHAVER C on: 09/27/2024 02:05 PM   Modules accepted: Orders

## 2024-09-27 NOTE — Telephone Encounter (Signed)
 Benefit verification started in new encounter. Will update referral once complete.

## 2024-09-27 NOTE — Telephone Encounter (Signed)
 Robin Welch

## 2024-09-27 NOTE — Telephone Encounter (Signed)
.  Lung Cancer Screening Narrative/Criteria Questionnaire (Cigarette Smokers Only- No Cigars/Pipes/vapes)   [name]   SDMV:10/06/2024 at 9:30 am Robin Welch      04/18/1966   LDCT: 10/10/2024 at 4:00 pm GI    59 y.o.   Phone: [phone]  Lung Screening Narrative (confirm age 62-77 yrs Medicare / 50-80 yrs Private pay insurance)   Insurance information: [employer] Medicare   Referring Provider: Rilla, MD  This screening involves an initial phone call with a team member from our program. It is called a shared decision making visit. The initial meeting is required by  insurance and Medicare to make sure you understand the program. This appointment takes about 15-20 minutes to complete. You will complete the screening scan at your scheduled date/time.  This scan takes about 5-10 minutes to complete. You can eat and drink normally before and after the scan.  Criteria questions for Lung Cancer Screening:   Are you a current or former smoker? Current Age began smoking: 48   If you are a former smoker, what year did you quit smoking? Never quit over one year. (within 15 yrs)   To calculate your smoking history, I need an accurate estimate of how many packs of cigarettes you smoked per day and for how many years. (Not just the number of PPD you are now smoking)   Years smoking 44 x Packs per day 2 = Pack years 42   (at least 20 pack yrs)   (Make sure they understand that we need to know how much they have smoked in the past, not just the number of PPD they are smoking now)  Do you have a personal history of cancer?  No    Do you have a family history of cancer? No  Are you coughing up blood?  No  Have you had unexplained weight loss of 15 lbs or more in the last 6 months? No  It looks like you meet all criteria.  When would be a good time for us  to schedule you for this screening?   Additional information: N/A

## 2024-09-27 NOTE — Telephone Encounter (Signed)
 Referral has been placed for Evenity  to start benefit verification process.

## 2024-10-02 NOTE — Telephone Encounter (Signed)
 Clinical questions have been answered and PA submitted. PA currently Pending.

## 2024-10-04 ENCOUNTER — Telehealth: Payer: Self-pay | Admitting: Family Medicine

## 2024-10-04 NOTE — Telephone Encounter (Signed)
 Copied from CRM #[id number]. Topic: Clinical - Medication Question >> Oct 03, 2024  3:56 PM Berneda F wrote: Reason for CRM: Patient would like to know when she is to begin injections for osteoporosis treatment.  Patient callback is [phone] (home)

## 2024-10-04 NOTE — Telephone Encounter (Signed)
 Spoke with pt. Advised her that the PA is still pending on evenity  at this time.

## 2024-10-06 ENCOUNTER — Ambulatory Visit: Admitting: *Deleted

## 2024-10-06 DIAGNOSIS — F17210 Nicotine dependence, cigarettes, uncomplicated: Secondary | ICD-10-CM

## 2024-10-06 NOTE — Patient Instructions (Signed)

## 2024-10-06 NOTE — Progress Notes (Signed)
 Virtual Visit via Telephone Note  I connected with Robin Welch on 10/06/24 at  9:30 AM EST by telephone and verified that I am speaking with the correct person using two identifiers.  Location: Patient: at home Provider: [address] [place], [place], [address]     I discussed the limitations, risks, security and privacy concerns of performing an evaluation and management service by telephone and the availability of in person appointments. I also discussed with the patient that there may be a patient responsible charge related to this service. The patient expressed understanding and agreed to proceed.   Shared Decision Making Visit Lung Cancer Screening Program [phone])   Eligibility: Age 59 y.o. Pack Years Smoking History Calculation 33 (# packs/per year x # years smoked) Recent History of coughing up blood  no Unexplained weight loss? no ( >Than 15 pounds within the last 6 months ) Prior History Lung / other cancer no (Diagnosis within the last 5 years already requiring surveillance chest CT Scans). Smoking Status Current Smoker Former Smokers: Years since quit: n/a  Quit Date: n/a  Visit Components: Discussion included one or more decision making aids. yes Discussion included risk/benefits of screening. yes Discussion included potential follow up diagnostic testing for abnormal scans. yes Discussion included meaning and risk of over diagnosis. yes Discussion included meaning and risk of False Positives. yes Discussion included meaning of total radiation exposure. yes  Counseling Included: Importance of adherence to annual lung cancer LDCT screening. yes Impact of comorbidities on ability to participate in the program. yes Ability and willingness to under diagnostic treatment. yes  Smoking Cessation Counseling: Current Smokers:  Discussed importance of smoking cessation. yes Information about tobacco cessation classes and interventions provided to patient.  yes Patient provided with ticket for LDCT Scan. no Symptomatic Patient. no  Counseling(Intermediate counseling: > three minutes) [address]406 Diagnosis Code: Tobacco Use Z72.0 Asymptomatic Patient yes  Smoking/Tobacco Cessation Counseling Robin Welch is a current user of tobacco or nicotine  products. She is considering quitting at this time. Counseling provided today addressed the risks of continued use and the benefits of cessation. Discussed tobacco/nicotine  use history, readiness to quit, and evidence-based treatment options including behavioral strategies, support resources, and pharmacologic therapies. Provided encouragement and educational materials on steps and resources to quit smoking. Patient questions were addressed, and follow-up recommended for continued support. Total time spent on counseling: 4 minutes.   Former Smokers:  Discussed the importance of maintaining cigarette abstinence. yes Diagnosis Code: Personal History of Nicotine  Dependence. S12.108 Information about tobacco cessation classes and interventions provided to patient. Yes Patient provided with ticket for LDCT Scan. no Written Order for Lung Cancer Screening with LDCT placed in Epic. Yes (CT Chest Lung Cancer Screening Low Dose W/O CM) [id number] Z12.2-Screening of respiratory organs Z87.891-Personal history of nicotine  dependence   Laneta Speaks, RN

## 2024-10-09 ENCOUNTER — Inpatient Hospital Stay: Admission: RE | Admit: 2024-10-09 | Source: Ambulatory Visit

## 2024-10-10 ENCOUNTER — Other Ambulatory Visit
